# Patient Record
Sex: Female | Born: 1975 | Race: White | Hispanic: No | Smoking: Former smoker
Health system: Southern US, Community
[De-identification: ages and names within clinical notes are randomized; demographics above are authoritative.]

## PROBLEM LIST (undated history)

## (undated) ENCOUNTER — Inpatient Hospital Stay (HOSPITAL_COMMUNITY): Payer: Self-pay

## (undated) DIAGNOSIS — Z87442 Personal history of urinary calculi: Secondary | ICD-10-CM

## (undated) DIAGNOSIS — F419 Anxiety disorder, unspecified: Secondary | ICD-10-CM

## (undated) DIAGNOSIS — C259 Malignant neoplasm of pancreas, unspecified: Secondary | ICD-10-CM

## (undated) DIAGNOSIS — I10 Essential (primary) hypertension: Secondary | ICD-10-CM

## (undated) DIAGNOSIS — R112 Nausea with vomiting, unspecified: Secondary | ICD-10-CM

## (undated) DIAGNOSIS — F32A Depression, unspecified: Secondary | ICD-10-CM

## (undated) DIAGNOSIS — D649 Anemia, unspecified: Secondary | ICD-10-CM

## (undated) DIAGNOSIS — O24419 Gestational diabetes mellitus in pregnancy, unspecified control: Secondary | ICD-10-CM

## (undated) DIAGNOSIS — F329 Major depressive disorder, single episode, unspecified: Secondary | ICD-10-CM

## (undated) DIAGNOSIS — C801 Malignant (primary) neoplasm, unspecified: Secondary | ICD-10-CM

## (undated) DIAGNOSIS — Z8619 Personal history of other infectious and parasitic diseases: Secondary | ICD-10-CM

## (undated) DIAGNOSIS — Z9889 Other specified postprocedural states: Secondary | ICD-10-CM

## (undated) DIAGNOSIS — M1990 Unspecified osteoarthritis, unspecified site: Secondary | ICD-10-CM

## (undated) DIAGNOSIS — R569 Unspecified convulsions: Secondary | ICD-10-CM

## (undated) DIAGNOSIS — I2699 Other pulmonary embolism without acute cor pulmonale: Secondary | ICD-10-CM

## (undated) HISTORY — DX: Other pulmonary embolism without acute cor pulmonale: I26.99

## (undated) HISTORY — PX: WISDOM TOOTH EXTRACTION: SHX21

## (undated) HISTORY — DX: Malignant neoplasm of pancreas, unspecified: C25.9

## (undated) HISTORY — DX: Gestational diabetes mellitus in pregnancy, unspecified control: O24.419

## (undated) HISTORY — DX: Anemia, unspecified: D64.9

## (undated) HISTORY — PX: APPENDECTOMY: SHX54

## (undated) HISTORY — DX: Unspecified osteoarthritis, unspecified site: M19.90

## (undated) HISTORY — DX: Personal history of urinary calculi: Z87.442

## (undated) HISTORY — DX: Malignant (primary) neoplasm, unspecified: C80.1

## (undated) HISTORY — DX: Personal history of other infectious and parasitic diseases: Z86.19

## (undated) MED FILL — Fluorouracil IV Soln 5 GM/100ML (50 MG/ML): INTRAVENOUS | Qty: 77 | Status: AC

## (undated) MED FILL — Fosaprepitant Dimeglumine For IV Infusion 150 MG (Base Eq): INTRAVENOUS | Qty: 5 | Status: AC

## (undated) MED FILL — Dexamethasone Sodium Phosphate Inj 100 MG/10ML: INTRAMUSCULAR | Qty: 1 | Status: AC

## (undated) MED FILL — Irinotecan HCl Inj 100 MG/5ML (20 MG/ML): INTRAVENOUS | Qty: 12 | Status: AC

## (undated) MED FILL — Leucovorin Calcium For Inj 350 MG: INTRAMUSCULAR | Qty: 33.6 | Status: AC

## (undated) MED FILL — Oxaliplatin IV Soln 100 MG/20ML: INTRAVENOUS | Qty: 30 | Status: AC

## (undated) MED FILL — Fluorouracil IV Soln 5 GM/100ML (50 MG/ML): INTRAVENOUS | Qty: 81 | Status: AC

## (undated) MED FILL — Leucovorin Calcium For Inj 350 MG: INTRAMUSCULAR | Qty: 31.8 | Status: AC

## (undated) MED FILL — Oxaliplatin IV Soln 50 MG/10ML: INTRAVENOUS | Qty: 27 | Status: AC

## (undated) MED FILL — Irinotecan HCl Inj 100 MG/5ML (20 MG/ML): INTRAVENOUS | Qty: 13 | Status: AC

## (undated) MED FILL — Oxaliplatin IV Soln 100 MG/20ML: INTRAVENOUS | Qty: 27 | Status: AC

---

## 1999-01-04 ENCOUNTER — Emergency Department (HOSPITAL_COMMUNITY): Admission: EM | Admit: 1999-01-04 | Discharge: 1999-01-04 | Payer: Self-pay | Admitting: Emergency Medicine

## 1999-06-14 ENCOUNTER — Encounter: Payer: Self-pay | Admitting: Emergency Medicine

## 1999-06-14 ENCOUNTER — Emergency Department (HOSPITAL_COMMUNITY): Admission: EM | Admit: 1999-06-14 | Discharge: 1999-06-14 | Payer: Self-pay | Admitting: Emergency Medicine

## 1999-08-15 ENCOUNTER — Encounter: Payer: Self-pay | Admitting: Emergency Medicine

## 1999-08-15 ENCOUNTER — Emergency Department (HOSPITAL_COMMUNITY): Admission: EM | Admit: 1999-08-15 | Discharge: 1999-08-15 | Payer: Self-pay | Admitting: Emergency Medicine

## 1999-09-18 DIAGNOSIS — R569 Unspecified convulsions: Secondary | ICD-10-CM

## 1999-09-18 HISTORY — DX: Unspecified convulsions: R56.9

## 1999-11-05 ENCOUNTER — Inpatient Hospital Stay (HOSPITAL_COMMUNITY): Admission: EM | Admit: 1999-11-05 | Discharge: 1999-11-07 | Payer: Self-pay

## 1999-11-05 ENCOUNTER — Encounter: Payer: Self-pay | Admitting: General Surgery

## 1999-11-14 ENCOUNTER — Emergency Department (HOSPITAL_COMMUNITY): Admission: EM | Admit: 1999-11-14 | Discharge: 1999-11-14 | Payer: Self-pay | Admitting: Emergency Medicine

## 1999-11-17 ENCOUNTER — Encounter: Payer: Self-pay | Admitting: Emergency Medicine

## 1999-11-17 ENCOUNTER — Emergency Department (HOSPITAL_COMMUNITY): Admission: EM | Admit: 1999-11-17 | Discharge: 1999-11-17 | Payer: Self-pay | Admitting: Emergency Medicine

## 2000-02-24 ENCOUNTER — Encounter: Payer: Self-pay | Admitting: Emergency Medicine

## 2000-02-24 ENCOUNTER — Emergency Department (HOSPITAL_COMMUNITY): Admission: EM | Admit: 2000-02-24 | Discharge: 2000-02-24 | Payer: Self-pay | Admitting: Emergency Medicine

## 2000-02-29 ENCOUNTER — Emergency Department (HOSPITAL_COMMUNITY): Admission: EM | Admit: 2000-02-29 | Discharge: 2000-02-29 | Payer: Self-pay | Admitting: Emergency Medicine

## 2000-04-26 ENCOUNTER — Emergency Department (HOSPITAL_COMMUNITY): Admission: EM | Admit: 2000-04-26 | Discharge: 2000-04-26 | Payer: Self-pay | Admitting: Emergency Medicine

## 2000-07-18 ENCOUNTER — Inpatient Hospital Stay (HOSPITAL_COMMUNITY): Admission: AD | Admit: 2000-07-18 | Discharge: 2000-07-18 | Payer: Self-pay | Admitting: Obstetrics & Gynecology

## 2001-02-26 ENCOUNTER — Emergency Department (HOSPITAL_COMMUNITY): Admission: EM | Admit: 2001-02-26 | Discharge: 2001-02-26 | Payer: Self-pay | Admitting: Emergency Medicine

## 2002-05-26 ENCOUNTER — Emergency Department (HOSPITAL_COMMUNITY): Admission: EM | Admit: 2002-05-26 | Discharge: 2002-05-26 | Payer: Self-pay | Admitting: Emergency Medicine

## 2002-06-16 ENCOUNTER — Emergency Department (HOSPITAL_COMMUNITY): Admission: EM | Admit: 2002-06-16 | Discharge: 2002-06-16 | Payer: Self-pay | Admitting: *Deleted

## 2002-08-01 ENCOUNTER — Emergency Department (HOSPITAL_COMMUNITY): Admission: EM | Admit: 2002-08-01 | Discharge: 2002-08-01 | Payer: Self-pay | Admitting: Emergency Medicine

## 2002-08-01 ENCOUNTER — Encounter: Payer: Self-pay | Admitting: Emergency Medicine

## 2002-12-19 ENCOUNTER — Inpatient Hospital Stay (HOSPITAL_COMMUNITY): Admission: AD | Admit: 2002-12-19 | Discharge: 2002-12-19 | Payer: Self-pay | Admitting: Obstetrics and Gynecology

## 2003-07-02 ENCOUNTER — Emergency Department (HOSPITAL_COMMUNITY): Admission: EM | Admit: 2003-07-02 | Discharge: 2003-07-02 | Payer: Self-pay | Admitting: Emergency Medicine

## 2003-07-02 ENCOUNTER — Encounter: Payer: Self-pay | Admitting: Emergency Medicine

## 2005-12-29 ENCOUNTER — Emergency Department (HOSPITAL_COMMUNITY): Admission: EM | Admit: 2005-12-29 | Discharge: 2005-12-29 | Payer: Self-pay | Admitting: Family Medicine

## 2007-08-19 ENCOUNTER — Emergency Department (HOSPITAL_COMMUNITY): Admission: EM | Admit: 2007-08-19 | Discharge: 2007-08-19 | Payer: Self-pay | Admitting: Emergency Medicine

## 2008-01-30 ENCOUNTER — Inpatient Hospital Stay (HOSPITAL_COMMUNITY): Admission: AD | Admit: 2008-01-30 | Discharge: 2008-02-03 | Payer: Self-pay | Admitting: Obstetrics

## 2008-01-30 ENCOUNTER — Inpatient Hospital Stay (HOSPITAL_COMMUNITY): Admission: AD | Admit: 2008-01-30 | Discharge: 2008-01-30 | Payer: Self-pay | Admitting: Obstetrics

## 2009-10-20 ENCOUNTER — Emergency Department (HOSPITAL_COMMUNITY): Admission: EM | Admit: 2009-10-20 | Discharge: 2009-10-21 | Payer: Self-pay | Admitting: Emergency Medicine

## 2010-12-07 LAB — URINALYSIS, ROUTINE W REFLEX MICROSCOPIC
Bilirubin Urine: NEGATIVE
Glucose, UA: NEGATIVE mg/dL
Nitrite: NEGATIVE
Urobilinogen, UA: 0.2 mg/dL (ref 0.0–1.0)

## 2010-12-07 LAB — URINE MICROSCOPIC-ADD ON

## 2011-01-30 NOTE — Op Note (Signed)
[name], Christina [name]               ACCOUNT NO.:  [id number]   MEDICAL RECORD NO.:  [id number]          PATIENT TYPE:  INP   LOCATION:  9146                          FACILITY:  [facility]   PHYSICIAN:  Kathreen Cosier, M.D.DATE OF BIRTH:  1976/08/11   DATE OF PROCEDURE:  01/31/2008  DATE OF DISCHARGE:                               OPERATIVE REPORT   PREOPERATIVE DIAGNOSIS:  Active labor, the patient demands C-section.   POSTOPERATIVE DIAGNOSIS:  Active labor, the patient demands C-section.   SURGEON:  Kathreen Cosier, MD.   ANESTHESIA:  Epidural.   PROCEDURE:  The patient placed on the operating table in supine  position.  Abdomen prepped and draped, bladder emptied with a Foley  catheter.  Transverse suprapubic incision made and carried down to the  rectus fascia.  Fascia cleaned and incised to length of the incision.  The recti muscles were retracted laterally.  Peritoneum was incised  longitudinally.  Transverse incision made in the visceral peritoneum  above the bladder.  Bladder mobilized inferiorly.  Transverse lower  uterine incision was made.  The patient delivered from the OP position  of a female, Apgar 8 and 9, weighing 7 pounds 3 ounces.  Team in  attendance.  Fluid clear.  Placenta fundal removed manually and sent to  labor and delivery.  Uterine cavity cleaned with dry laps.  Uterine  incision closed in 1 layer with continuous suture of #1 chromic.  Hemostasis was satisfactory.  Bladder flap reattached with 2-0 chromic.  Uterus well contracted.  Tubes and ovaries normal.  Abdomen closed in  layers.  Peritoneum continuous suture of 0-chromic fascia, continuous  suture of 0-Dexon.  Skin closed with subcuticular stitch of 4-0  Monocryl.  Blood loss 400 mL.           ______________________________  Kathreen Cosier, M.D.     BAM/MEDQ  D:  01/31/2008  T:  02/01/2008  Job:  [id number]

## 2011-01-30 NOTE — H&P (Signed)
NAMEVICIE, CECH               ACCOUNT NO.:  [id number]   MEDICAL RECORD NO.:  [id number]           PATIENT TYPE:   LOCATION:                                 FACILITY:   PHYSICIAN:  Kathreen Cosier, M.D.DATE OF BIRTH:  11/12/1975   DATE OF ADMISSION:  01/31/2008  DATE OF DISCHARGE:                              HISTORY & PHYSICAL   The patient is a 35 year old primigravida, EDC Feb 03, 2008, membranes  ruptured at 8 p.m. last night, who was having occasional contractions.  She had a negative GBS and HIV.  Cervix was 1 cm, 100%, vertex -3.  She  was started on Pitocin at 5:30 a.m. on the day of admission.  At 9:35  a.m., she was having some variables in laid position.  IUPC was  inserted.  An amnioinfusion began.  She was 4 cm, 100%, and -2.  At 2:45  p.m., still having mild variables with each contraction and fetal  tachycardia up to 160-170, cervix 6 cm, vertex -2; and the patient said  that she was not going to live anymore and demanded C-section, her wish  was granted.   PHYSICAL EXAMINATION:  GENERAL:  Revealed a well-developed female in no  distress.  She has an epidural.  HEENT:  Negative.  LUNGS:  Clear.  HEART:  Regular rhythm.  No murmurs or gallops.  BREASTS:  No masses.  ABDOMEN:  Term-size uterus.  PELVIC:  As described above.  LEGS:  Negative.           ______________________________  Kathreen Cosier, M.D.     BAM/MEDQ  D:  01/31/2008  T:  02/01/2008  Job:  [id number]

## 2011-02-02 NOTE — Discharge Summary (Signed)
[name], [name]               ACCOUNT NO.:  [id number]   MEDICAL RECORD NO.:  [id number]          PATIENT TYPE:  INP   LOCATION:  9199                          FACILITY:  [facility]   PHYSICIAN:  Kathreen Cosier, M.D.DATE OF BIRTH:  Aug 11, 1976   DATE OF ADMISSION:  01/30/2008  DATE OF DISCHARGE:  02/03/2008                               DISCHARGE SUMMARY   The patient is a 35 year old gravida 1, EDC Feb 03, 2008.  She was  admitted with ruptured membranes at 8 p.m. the night prior to admission.  Negative GBS.  She was 1 cm, 100% vertex, -3.  She had some variable  decelerations.  An IUPC was inserted and amnioinfusion began.  The  patient progressed to 6 cm vertex, -2, and then the patient said she did  not want a labor anymore and demanded a C section.  This was performed  and she had a female, Apgar 8 and 9, weighing 7 pounds 3 ounces.  Postoperatively, she did well.  Hemoglobin was 11.5.  She was discharged  on the third postop day, ambulatory on a regular diet, Tylox for pain,  and to see me in 6 weeks.   DISCHARGE DIAGNOSIS:  Status post primary low transverse cesarean  section at term.           ______________________________  Kathreen Cosier, M.D.     BAM/MEDQ  D:  02/25/2008  T:  02/25/2008  Job:  [id number]

## 2011-06-13 LAB — RPR: RPR Ser Ql: NONREACTIVE

## 2011-06-13 LAB — CBC
HCT: 38
MCHC: 33.2
Platelets: 274
RBC: 4.56
RDW: 13.5
WBC: 9

## 2012-09-02 ENCOUNTER — Emergency Department (HOSPITAL_COMMUNITY): Payer: Self-pay

## 2012-09-02 ENCOUNTER — Emergency Department (HOSPITAL_COMMUNITY)
Admission: EM | Admit: 2012-09-02 | Discharge: 2012-09-02 | Disposition: A | Discharge status: Home / Self Care | Payer: Self-pay | Attending: Emergency Medicine | Admitting: Emergency Medicine

## 2012-09-02 ENCOUNTER — Encounter (HOSPITAL_COMMUNITY): Payer: Self-pay | Admitting: Emergency Medicine

## 2012-09-02 DIAGNOSIS — R0602 Shortness of breath: Secondary | ICD-10-CM | POA: Insufficient documentation

## 2012-09-02 DIAGNOSIS — F17200 Nicotine dependence, unspecified, uncomplicated: Secondary | ICD-10-CM | POA: Insufficient documentation

## 2012-09-02 DIAGNOSIS — Z8659 Personal history of other mental and behavioral disorders: Secondary | ICD-10-CM | POA: Insufficient documentation

## 2012-09-02 DIAGNOSIS — M940 Chondrocostal junction syndrome [Tietze]: Secondary | ICD-10-CM | POA: Insufficient documentation

## 2012-09-02 DIAGNOSIS — G40909 Epilepsy, unspecified, not intractable, without status epilepticus: Secondary | ICD-10-CM | POA: Insufficient documentation

## 2012-09-02 HISTORY — DX: Unspecified convulsions: R56.9

## 2012-09-02 HISTORY — DX: Major depressive disorder, single episode, unspecified: F32.9

## 2012-09-02 HISTORY — DX: Depression, unspecified: F32.A

## 2012-09-02 HISTORY — DX: Anxiety disorder, unspecified: F41.9

## 2012-09-02 LAB — BASIC METABOLIC PANEL
BUN: 9 mg/dL (ref 6–23)
GFR calc Af Amer: >90 mL/min (ref >90)
GFR calc non Af Amer: >90 mL/min (ref >90)
Potassium: 3.7 mEq/L (ref 3.5–5.1)

## 2012-09-02 LAB — D-DIMER, QUANTITATIVE: D-Dimer, Quant: 0.3 ug/mL-FEU (ref 0.00–0.48)

## 2012-09-02 LAB — CBC WITH DIFFERENTIAL/PLATELET
Eosinophils Absolute: 0.1 10*3/uL (ref 0.0–0.7)
Hemoglobin: 14.9 g/dL (ref 12.0–15.0)
Lymphs Abs: 2.4 10*3/uL (ref 0.7–4.0)
MCV: 84.5 fL (ref 78.0–100.0)
Monocytes Absolute: 0.4 10*3/uL (ref 0.1–1.0)
Monocytes Relative: 5 % (ref 3–12)
Neutro Abs: 5.1 10*3/uL (ref 1.7–7.7)
Neutrophils Relative %: 64 % (ref 43–77)
Platelets: 288 10*3/uL (ref 150–400)
RDW: 13 % (ref 11.5–15.5)

## 2012-09-02 LAB — TROPONIN I: Troponin I: <0.3 ng/mL (ref <0.30)

## 2012-09-02 NOTE — ED Notes (Signed)
Pt c/o mid sub sternal cp x 1 hour
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
## 2013-01-24 ENCOUNTER — Emergency Department (HOSPITAL_COMMUNITY)
Admission: EM | Admit: 2013-01-24 | Discharge: 2013-01-24 | Disposition: A | Discharge status: Home / Self Care | Payer: Medicaid Other | Attending: Emergency Medicine | Admitting: Emergency Medicine

## 2013-01-24 ENCOUNTER — Encounter (HOSPITAL_COMMUNITY): Payer: Self-pay | Admitting: Emergency Medicine

## 2013-01-24 DIAGNOSIS — M549 Dorsalgia, unspecified: Secondary | ICD-10-CM

## 2013-01-24 DIAGNOSIS — F17200 Nicotine dependence, unspecified, uncomplicated: Secondary | ICD-10-CM | POA: Insufficient documentation

## 2013-01-24 DIAGNOSIS — IMO0002 Reserved for concepts with insufficient information to code with codable children: Secondary | ICD-10-CM | POA: Insufficient documentation

## 2013-01-24 DIAGNOSIS — Z8659 Personal history of other mental and behavioral disorders: Secondary | ICD-10-CM | POA: Insufficient documentation

## 2013-01-24 DIAGNOSIS — Z8669 Personal history of other diseases of the nervous system and sense organs: Secondary | ICD-10-CM | POA: Insufficient documentation

## 2013-01-24 MED ORDER — DIAZEPAM 2 MG PO TABS
2.0000 mg | ORAL_TABLET | Freq: Once | ORAL | Status: AC
  Administered 2013-01-24: 2 mg via ORAL
  Filled 2013-01-24: qty 1

## 2013-01-24 MED ORDER — OXYCODONE-ACETAMINOPHEN 5-325 MG PO TABS: 1.0000 | ORAL_TABLET | ORAL | Status: DC | PRN
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
## 2013-01-24 NOTE — ED Notes (Signed)
Talked with [employer], the patient wants documentation to take to the [employer] for her case file. Officer states that she can take her pictures and this chart can reflect where the new wounds /bruising is and the patient will have to take the documentation to be included with the case file
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
## 2013-01-25 NOTE — ED Provider Notes (Signed)
Medical screening examination/treatment/procedure(s) were performed by non-physician practitioner and as supervising physician I was immediately available for consultation/collaboration.  Syesha Thaw, MD 01/25/13 0819 

## 2013-05-06 ENCOUNTER — Emergency Department (HOSPITAL_COMMUNITY)
Admission: EM | Admit: 2013-05-06 | Discharge: 2013-05-06 | Disposition: A | Discharge status: Home / Self Care | Payer: Medicaid Other | Attending: Emergency Medicine | Admitting: Emergency Medicine

## 2013-05-06 ENCOUNTER — Encounter (HOSPITAL_COMMUNITY): Payer: Self-pay | Admitting: Emergency Medicine

## 2013-05-06 DIAGNOSIS — J029 Acute pharyngitis, unspecified: Secondary | ICD-10-CM

## 2013-05-06 DIAGNOSIS — F411 Generalized anxiety disorder: Secondary | ICD-10-CM | POA: Insufficient documentation

## 2013-05-06 DIAGNOSIS — F3289 Other specified depressive episodes: Secondary | ICD-10-CM | POA: Insufficient documentation

## 2013-05-06 DIAGNOSIS — G40909 Epilepsy, unspecified, not intractable, without status epilepticus: Secondary | ICD-10-CM | POA: Insufficient documentation

## 2013-05-06 DIAGNOSIS — F17200 Nicotine dependence, unspecified, uncomplicated: Secondary | ICD-10-CM | POA: Insufficient documentation

## 2013-05-06 DIAGNOSIS — F329 Major depressive disorder, single episode, unspecified: Secondary | ICD-10-CM | POA: Insufficient documentation

## 2013-05-06 DIAGNOSIS — Z79899 Other long term (current) drug therapy: Secondary | ICD-10-CM | POA: Insufficient documentation

## 2013-05-06 MED ORDER — AMOXICILLIN 500 MG PO CAPS: 500.0000 mg | ORAL_CAPSULE | Freq: Three times a day (TID) | ORAL | Status: DC

## 2013-05-06 NOTE — ED Provider Notes (Signed)
CSN: [id number]     Arrival date & time 05/06/13  2259 History  This chart was scribed for Christina [name],  by Danella Maiers, ED Scribe. This patient was seen in room APA18/APA18 and the patient's care was started at 11:27 PM.   Chief Complaint  Patient presents with  . Sore Throat    The history is provided by the patient. No language interpreter was used.   HPI Comments: Christina [name] is a 37 y.o. female who presents to the Emergency Department complaining of constant, gradually worsening sore throat that started a few days ago. She reports that her child's babysitter has mono and a relative has strep throat. Her child who she lives with is experiencing similar symptoms. 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 She denies cough, rash, emesis, diarrhea, and nausea. She is a current every day smoker and an occasional alcohol user.   Past Medical History  Diagnosis Date  . Seizures   . Anxiety   . Depression    Past Surgical History  Procedure Laterality Date  . Appendectomy    . Cesarean section     History reviewed. No pertinent family history. History  Substance Use Topics  . Smoking status: Current Every Day Smoker  . Smokeless tobacco: Not on file  . Alcohol Use: Yes   OB History   Grav Para Term Preterm Abortions TAB SAB Ect Mult Living                 Review of Systems  Constitutional: Negative for fever and chills.  HENT: Positive for sore throat. Negative for trouble swallowing.   Respiratory: Negative for cough.   Gastrointestinal: Negative for nausea, vomiting and diarrhea.  Skin: Negative for rash.  All other systems reviewed and are negative.    Allergies  Claritin and Diphenhydramine  Home Medications   Current Outpatient Rx  Name  Route  Sig  Dispense  Refill  . ibuprofen (ADVIL,MOTRIN) 200 MG tablet   Oral   Take 200 mg by mouth every 6 (six) hours as needed for pain.         Marland Kitchen  lamoTRIgine (LAMICTAL) 25 MG tablet   Oral   Take 25 mg by mouth daily.         Marland Kitchen oxyCODONE-acetaminophen (PERCOCET) 5-325 MG per tablet   Oral   Take 1 tablet by mouth every 4 (four) hours as needed for pain.   15 tablet   0    Triage vitals: BP 130/108  Pulse 82  Temp(Src) 98 F (36.7 C) (Oral)  Resp 20  Ht 4\' 11"  (1.499 m)  Wt 170 lb (77.111 kg)  BMI 34.32 kg/m2  SpO2 100%  LMP 04/12/2013 Physical Exam  Constitutional: She is oriented to person, place, and time. She appears well-developed and well-nourished. No distress.  HENT:  Head: Normocephalic and atraumatic.  Right Ear: Hearing normal.  Left Ear: Hearing normal.  Nose: Nose normal.  Mouth/Throat: Oropharynx is clear and moist and mucous membranes are normal.  Eyes: Conjunctivae and EOM are normal. Pupils are equal, round, and reactive to light.  Neck: Normal range of motion. Neck supple.  Cardiovascular: Regular rhythm, S1 normal and S2 normal.  Exam reveals no gallop and no friction rub.   No murmur heard. Pulmonary/Chest: Effort normal and breath sounds normal. No respiratory distress. She exhibits no tenderness.  Abdominal: Soft. Normal appearance and bowel sounds are normal. There is  no hepatosplenomegaly. There is no tenderness. There is no rebound, no guarding, no tenderness at McBurney's point and negative Murphy's sign. No hernia.  Musculoskeletal: Normal range of motion.  Neurological: She is alert and oriented to person, place, and time. She has normal strength. No cranial nerve deficit or sensory deficit. Coordination normal. GCS eye subscore is 4. GCS verbal subscore is 5. GCS motor subscore is 6.  Skin: Skin is warm, dry and intact. No rash noted. No cyanosis.  Psychiatric: She has a normal mood and affect. Her speech is normal and behavior is normal. Thought content normal.    ED Course  Medications - No data to display  DIAGNOSTIC STUDIES: Oxygen Saturation is 100% on room air, normal by my  interpretation.    COORDINATION OF CARE: 11:32 PM- Discussed treatment plan with pt, which includes discharge with amoxicillin, and pt agrees to plan.    Procedures (including critical care time)  Labs Reviewed - No data to display No results found.  Diagnosis: Sore throat, strep exposure  MDM  She presents to ER with complaints of sore throat. Her son has similar symptoms. Patient reports that her son's caregiver who was diagnosed with mono in the last few weeks, but in the last couple of days the grandmother was diagnosed with strep throat and she watches the sun 2 times a week. Patient will therefore be empirically covered for strep throat.   I personally performed the services described in this documentation, which was scribed in my presence. The recorded information has been reviewed and is accurate.     Christina Crease, MD 05/06/13 [phone]

## 2013-05-06 NOTE — ED Notes (Signed)
Pt has been around person with mono and strep throat.

## 2013-09-17 DIAGNOSIS — O24419 Gestational diabetes mellitus in pregnancy, unspecified control: Secondary | ICD-10-CM

## 2013-09-17 HISTORY — DX: Gestational diabetes mellitus in pregnancy, unspecified control: O24.419

## 2013-11-15 ENCOUNTER — Emergency Department (HOSPITAL_COMMUNITY)
Admission: EM | Admit: 2013-11-15 | Discharge: 2013-11-15 | Disposition: A | Discharge status: Home / Self Care | Payer: Medicaid Other | Attending: Emergency Medicine | Admitting: Emergency Medicine

## 2013-11-15 ENCOUNTER — Encounter (HOSPITAL_COMMUNITY): Payer: Self-pay | Admitting: Emergency Medicine

## 2013-11-15 DIAGNOSIS — Y9389 Activity, other specified: Secondary | ICD-10-CM | POA: Insufficient documentation

## 2013-11-15 DIAGNOSIS — G40909 Epilepsy, unspecified, not intractable, without status epilepticus: Secondary | ICD-10-CM | POA: Insufficient documentation

## 2013-11-15 DIAGNOSIS — Y929 Unspecified place or not applicable: Secondary | ICD-10-CM | POA: Insufficient documentation

## 2013-11-15 DIAGNOSIS — Z8659 Personal history of other mental and behavioral disorders: Secondary | ICD-10-CM | POA: Insufficient documentation

## 2013-11-15 DIAGNOSIS — S61409A Unspecified open wound of unspecified hand, initial encounter: Secondary | ICD-10-CM | POA: Insufficient documentation

## 2013-11-15 DIAGNOSIS — Z79899 Other long term (current) drug therapy: Secondary | ICD-10-CM | POA: Insufficient documentation

## 2013-11-15 DIAGNOSIS — F17200 Nicotine dependence, unspecified, uncomplicated: Secondary | ICD-10-CM | POA: Insufficient documentation

## 2013-11-15 DIAGNOSIS — S61419A Laceration without foreign body of unspecified hand, initial encounter: Secondary | ICD-10-CM

## 2013-11-15 DIAGNOSIS — W268XXA Contact with other sharp object(s), not elsewhere classified, initial encounter: Secondary | ICD-10-CM | POA: Insufficient documentation

## 2013-11-15 NOTE — ED Notes (Signed)
Pt presents with a right index finger laceration, 4cm in length, active mild bleeding assessed. Has no apparent signs of distress.

## 2013-11-15 NOTE — Discharge Instructions (Signed)
You were treated for urinary laceration of the hand. This was cleaned and closed with sutures. Your sutures will dissolve in one to 2 weeks. Followup with your primary care provider for continued evaluation and treatment. Keep her wound clean and dry.    Laceration Care, Adult A laceration is a cut or lesion that goes through all layers of the skin and into the tissue just beneath the skin. TREATMENT  Some lacerations may not require closure. Some lacerations may not be able to be closed due to an increased risk of infection. It is important to see your caregiver as soon as possible after an injury to minimize the risk of infection and maximize the opportunity for successful closure. If closure is appropriate, pain medicines may be given, if needed. The wound will be cleaned to help prevent infection. Your caregiver will use stitches (sutures), staples, wound glue (adhesive), or skin adhesive strips to repair the laceration. These tools bring the skin edges together to allow for faster healing and a better cosmetic outcome. However, all wounds will heal with a scar. Once the wound has healed, scarring can be minimized by covering the wound with sunscreen during the day for 1 full year. HOME CARE INSTRUCTIONS  For sutures or staples:  Keep the wound clean and dry.  If you were given a bandage (dressing), you should change it at least once a day. Also, change the dressing if it becomes wet or dirty, or as directed by your caregiver.  Wash the wound with soap and water 2 times a day. Rinse the wound off with water to remove all soap. Pat the wound dry with a clean towel.  After cleaning, apply a thin layer of the antibiotic ointment as recommended by your caregiver. This will help prevent infection and keep the dressing from sticking.  You may shower as usual after the first 24 hours. Do not soak the wound in water until the sutures are removed.  Only take over-the-counter or prescription  medicines for pain, discomfort, or fever as directed by your caregiver.  Get your sutures or staples removed as directed by your caregiver. For skin adhesive strips:  Keep the wound clean and dry.  Do not get the skin adhesive strips wet. You may bathe carefully, using caution to keep the wound dry.  If the wound gets wet, pat it dry with a clean towel.  Skin adhesive strips will fall off on their own. You may trim the strips as the wound heals. Do not remove skin adhesive strips that are still stuck to the wound. They will fall off in time. For wound adhesive:  You may briefly wet your wound in the shower or bath. Do not soak or scrub the wound. Do not swim. Avoid periods of heavy perspiration until the skin adhesive has fallen off on its own. After showering or bathing, gently pat the wound dry with a clean towel.  Do not apply liquid medicine, cream medicine, or ointment medicine to your wound while the skin adhesive is in place. This may loosen the film before your wound is healed.  If a dressing is placed over the wound, be careful not to apply tape directly over the skin adhesive. This may cause the adhesive to be pulled off before the wound is healed.  Avoid prolonged exposure to sunlight or tanning lamps while the skin adhesive is in place. Exposure to ultraviolet light in the first year will darken the scar.  The skin adhesive will usually remain  in place for 5 to 10 days, then naturally fall off the skin. Do not pick at the adhesive film. You may need a tetanus shot if:  You cannot remember when you had your last tetanus shot.  You have never had a tetanus shot. If you get a tetanus shot, your arm may swell, get red, and feel warm to the touch. This is common and not a problem. If you need a tetanus shot and you choose not to have one, there is a rare chance of getting tetanus. Sickness from tetanus can be serious. SEEK MEDICAL CARE IF:   You have redness, swelling, or  increasing pain in the wound.  You see a red line that goes away from the wound.  You have yellowish-white fluid (pus) coming from the wound.  You have a fever.  You notice a bad smell coming from the wound or dressing.  Your wound breaks open before or after sutures have been removed.  You notice something coming out of the wound such as wood or glass.  Your wound is on your hand or foot and you cannot move a finger or toe. SEEK IMMEDIATE MEDICAL CARE IF:   Your pain is not controlled with prescribed medicine.  You have severe swelling around the wound causing pain and numbness or a change in color in your arm, hand, leg, or foot.  Your wound splits open and starts bleeding.  You have worsening numbness, weakness, or loss of function of any joint around or beyond the wound.  You develop painful lumps near the wound or on the skin anywhere on your body. MAKE SURE YOU:   Understand these instructions.  Will watch your condition.  Will get help right away if you are not doing well or get worse. Document Released: 09/03/2005 Document Revised: 11/26/2011 Document Reviewed: 02/27/2011 [facility] Patient Information 2014 [place], [place].

## 2013-11-15 NOTE — ED Provider Notes (Signed)
CSN: [id number]     Arrival date & time 11/15/13  1954 History   First MD Initiated Contact with Patient 11/15/13 2226    This chart was scribed for Christina Sams PA-C, a non-physician practitioner working with Tanna Furry, MD by Denice Bors, ED Scribe. This patient was seen in room WTR7/WTR7 and the patient's care was started at 11:07 PM     Chief Complaint  Patient presents with  . Laceration   The history is provided by the patient. No language interpreter was used.   HPI Comments: Christina [name] is a 38 y.o. female who presents to the Emergency Department complaining of 3 cm laceration on dorsal right hand overlying 2nd MCP joint with a broken glass while washing dishes onset 4 hours. Reports associated decreased ROM of right index finger secondary to pain. Reports pain is exacerbated by touch and movement. Denies any alleviating factors. Denies associated numbness. Reports unsure about tetanus status but thinks she got it in 2009. Reports she will check with her doctor tomorrow about tetanus status.     Past Medical History  Diagnosis Date  . Seizures   . Anxiety   . Depression    Past Surgical History  Procedure Laterality Date  . Appendectomy    . Cesarean section     History reviewed. No pertinent family history. History  Substance Use Topics  . Smoking status: Current Every Day Smoker -- 2.00 packs/day    Types: Cigarettes  . Smokeless tobacco: Not on file  . Alcohol Use: Yes   OB History   Grav Para Term Preterm Abortions TAB SAB Ect Mult Living   1              Review of Systems  Neurological: Negative for weakness and numbness.     A complete 10 system review of systems was obtained and all systems are negative except as noted in the HPI and PMHx.    Allergies  Claritin and Diphenhydramine  Home Medications   Current Outpatient Rx  Name  Route  Sig  Dispense  Refill  . acetaminophen (TYLENOL) 325 MG tablet   Oral   Take 325 mg by mouth every 6  (six) hours as needed for mild pain, fever or headache.         . lamoTRIgine (LAMICTAL) 25 MG tablet   Oral   Take 25 mg by mouth daily as needed (seizures).          . Multiple Vitamin (MULTIVITAMIN WITH MINERALS) TABS tablet   Oral   Take 1 tablet by mouth daily.          BP 126/70  Pulse 66  Temp(Src) 98.2 F (36.8 C) (Oral)  Resp 20  Ht 4\' 11"  (1.499 m)  Wt 160 lb (72.576 kg)  BMI 32.30 kg/m2  SpO2 100%  LMP 04/12/2013 Physical Exam  Nursing note and vitals reviewed. Constitutional: She is oriented to person, place, and time. She appears well-developed and well-nourished. No distress.  HENT:  Head: Normocephalic and atraumatic.  Eyes: EOM are normal.  Neck: Neck supple. No tracheal deviation present.  Cardiovascular: Normal rate.   Pulmonary/Chest: Effort normal. No respiratory distress.  Musculoskeletal: Normal range of motion.  Normal distal sensations and capillary refill  Neurological: She is alert and oriented to person, place, and time.  Skin: Skin is warm and dry.  3 cm laceration overlying right 2nd MCP joint   Psychiatric: She has a normal mood and affect. Her behavior is  normal.    ED Course  Procedures   COORDINATION OF CARE:  Nursing notes reviewed. Vital signs reviewed. Initial pt interview and examination performed.   11:10 PM-Discussed treatment plan with pt at bedside, which includes laceration repair. Pt agrees with plan.  LACERATION REPAIR Performed by: Christina Sams PA-C Consent: Verbal consent obtained. Risks and benefits: risks, benefits and alternatives were discussed Patient identity confirmed: provided demographic data Time out performed prior to procedure Prepped and Draped in normal sterile fashion Wound explored Laceration Location: dorsal aspect overlying 2nd MCP joint of right hand   Laceration Length: 3 cm No Foreign Bodies seen or palpated Anesthesia: local infiltration Local anesthetic: lidocaine 2% without  epinephrine Anesthetic total: 1 ml Irrigation method: syringe Amount of cleaning: standard Skin closure: 5-0 vicryl rapide  Number of sutures or staples: 4 sutures Technique: simple interrupted  Patient tolerance: Patient tolerated the procedure well with no immediate complications.   MDM   Final diagnoses:  Laceration of hand      I personally performed the services described in this documentation, which was scribed in my presence. The recorded information has been reviewed and is accurate.   Martie Lee, PA-C 11/16/13 [phone]

## 2013-11-24 NOTE — ED Provider Notes (Signed)
Medical screening examination/treatment/procedure(s) were performed by non-physician practitioner and as supervising physician I was immediately available for consultation/collaboration.   EKG Interpretation None        Tanna Furry, MD 11/24/13 [phone]

## 2013-11-25 ENCOUNTER — Encounter (HOSPITAL_COMMUNITY): Payer: Self-pay | Admitting: Emergency Medicine

## 2013-11-25 ENCOUNTER — Emergency Department (HOSPITAL_COMMUNITY)
Admission: EM | Admit: 2013-11-25 | Discharge: 2013-11-26 | Disposition: A | Discharge status: Home / Self Care | Payer: Medicaid Other | Attending: Emergency Medicine | Admitting: Emergency Medicine

## 2013-11-25 DIAGNOSIS — Z8659 Personal history of other mental and behavioral disorders: Secondary | ICD-10-CM | POA: Insufficient documentation

## 2013-11-25 DIAGNOSIS — O99330 Smoking (tobacco) complicating pregnancy, unspecified trimester: Secondary | ICD-10-CM | POA: Insufficient documentation

## 2013-11-25 DIAGNOSIS — S0010XA Contusion of unspecified eyelid and periocular area, initial encounter: Secondary | ICD-10-CM | POA: Insufficient documentation

## 2013-11-25 DIAGNOSIS — S0990XA Unspecified injury of head, initial encounter: Secondary | ICD-10-CM | POA: Insufficient documentation

## 2013-11-25 DIAGNOSIS — N898 Other specified noninflammatory disorders of vagina: Secondary | ICD-10-CM | POA: Insufficient documentation

## 2013-11-25 DIAGNOSIS — T148XXA Other injury of unspecified body region, initial encounter: Secondary | ICD-10-CM

## 2013-11-25 DIAGNOSIS — S3981XA Other specified injuries of abdomen, initial encounter: Secondary | ICD-10-CM | POA: Insufficient documentation

## 2013-11-25 DIAGNOSIS — S0083XA Contusion of other part of head, initial encounter: Secondary | ICD-10-CM

## 2013-11-25 DIAGNOSIS — Z8669 Personal history of other diseases of the nervous system and sense organs: Secondary | ICD-10-CM | POA: Insufficient documentation

## 2013-11-25 DIAGNOSIS — O9989 Other specified diseases and conditions complicating pregnancy, childbirth and the puerperium: Secondary | ICD-10-CM | POA: Insufficient documentation

## 2013-11-25 DIAGNOSIS — S0003XA Contusion of scalp, initial encounter: Secondary | ICD-10-CM | POA: Insufficient documentation

## 2013-11-25 DIAGNOSIS — T71163A Asphyxiation due to hanging, assault, initial encounter: Secondary | ICD-10-CM | POA: Insufficient documentation

## 2013-11-25 DIAGNOSIS — S1093XA Contusion of unspecified part of neck, initial encounter: Secondary | ICD-10-CM

## 2013-11-25 MED ORDER — HYDROCODONE-ACETAMINOPHEN 5-325 MG PO TABS: 1.0000 | ORAL_TABLET | Freq: Once | ORAL | Status: DC

## 2013-11-25 MED ORDER — ACETAMINOPHEN 325 MG PO TABS
650.0000 mg | ORAL_TABLET | Freq: Once | ORAL | Status: AC
  Administered 2013-11-26: 650 mg via ORAL
  Filled 2013-11-25: qty 2

## 2013-11-25 NOTE — ED Notes (Signed)
Nurse First Rounds : Nurse explained delay , wait time and process to pt. No distress/ respirations unlabored / calm / no distress.
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
## 2013-11-26 ENCOUNTER — Emergency Department (HOSPITAL_COMMUNITY): Payer: Medicaid Other

## 2013-11-26 LAB — WET PREP, GENITAL
Trich, Wet Prep: NONE SEEN
YEAST WET PREP: NONE SEEN

## 2013-11-26 LAB — CBC WITH DIFFERENTIAL/PLATELET
Basophils Absolute: 0.1 10*3/uL (ref 0.0–0.1)
Basophils Relative: 1 % (ref 0–1)
Eosinophils Absolute: 0.2 10*3/uL (ref 0.0–0.7)
Eosinophils Relative: 2 % (ref 0–5)
HCT: 37.3 % (ref 36.0–46.0)
HEMOGLOBIN: 13.1 g/dL (ref 12.0–15.0)
LYMPHS ABS: 3.1 10*3/uL (ref 0.7–4.0)
Lymphocytes Relative: 31 % (ref 12–46)
MCH: 29.7 pg (ref 26.0–34.0)
MCHC: 35.1 g/dL (ref 30.0–36.0)
MCV: 84.6 fL (ref 78.0–100.0)
Monocytes Absolute: 0.8 10*3/uL (ref 0.1–1.0)
Monocytes Relative: 8 % (ref 3–12)
NEUTROS ABS: 5.6 10*3/uL (ref 1.7–7.7)
NEUTROS PCT: 58 % (ref 43–77)
PLATELETS: 274 10*3/uL (ref 150–400)
RBC: 4.41 MIL/uL (ref 3.87–5.11)
RDW: 12.7 % (ref 11.5–15.5)
WBC: 9.8 10*3/uL (ref 4.0–10.5)

## 2013-11-26 LAB — ABO/RH: ABO/RH(D): A POS

## 2013-11-26 LAB — URINE MICROSCOPIC-ADD ON

## 2013-11-26 LAB — I-STAT CHEM 8, ED
BUN: 7 mg/dL (ref 6–23)
Calcium, Ion: 1.14 mmol/L (ref 1.12–1.23)
Chloride: 102 mEq/L (ref 96–112)
Creatinine, Ser: 0.6 mg/dL (ref 0.50–1.10)
Glucose, Bld: 102 mg/dL — ABNORMAL HIGH (ref 70–99)
HCT: 39 % (ref 36.0–46.0)
Hemoglobin: 13.3 g/dL (ref 12.0–15.0)
POTASSIUM: 3.1 meq/L — AB (ref 3.7–5.3)
SODIUM: 138 meq/L (ref 137–147)
TCO2: 22 mmol/L (ref 0–100)

## 2013-11-26 LAB — URINALYSIS, ROUTINE W REFLEX MICROSCOPIC
BILIRUBIN URINE: NEGATIVE
Glucose, UA: NEGATIVE mg/dL
Hgb urine dipstick: NEGATIVE
KETONES UR: 40 mg/dL — AB
NITRITE: NEGATIVE
PH: 5.5 (ref 5.0–8.0)
PROTEIN: NEGATIVE mg/dL
Specific Gravity, Urine: 1.02 (ref 1.005–1.030)
Urobilinogen, UA: 1 mg/dL (ref 0.0–1.0)

## 2013-11-26 LAB — HCG, QUANTITATIVE, PREGNANCY: hCG, Beta Chain, Quant, S: 23506 m[IU]/mL — ABNORMAL HIGH (ref <5)

## 2013-11-26 NOTE — ED Provider Notes (Signed)
Medical screening examination/treatment/procedure(s) were performed by non-physician practitioner and as supervising physician I was immediately available for consultation/collaboration.   EKG Interpretation None        Hoy Morn, MD 11/26/13 [phone]

## 2013-11-26 NOTE — ED Notes (Signed)
Patient transported to US 

## 2013-11-26 NOTE — ED Notes (Signed)
Patient transported to X-ray 

## 2013-11-26 NOTE — ED Notes (Signed)
MD at bedside preforming US
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
## 2013-11-27 LAB — GC/CHLAMYDIA PROBE AMP
CT Probe RNA: NEGATIVE
GC PROBE AMP APTIMA: NEGATIVE

## 2014-01-27 ENCOUNTER — Encounter (HOSPITAL_COMMUNITY): Payer: Self-pay

## 2014-01-27 ENCOUNTER — Inpatient Hospital Stay (HOSPITAL_COMMUNITY)
Admission: AD | Admit: 2014-01-27 | Discharge: 2014-01-27 | Disposition: A | Discharge status: Home / Self Care | Payer: Medicaid Other | Source: Ambulatory Visit | Attending: Obstetrics & Gynecology | Admitting: Obstetrics & Gynecology

## 2014-01-27 DIAGNOSIS — F17200 Nicotine dependence, unspecified, uncomplicated: Secondary | ICD-10-CM | POA: Insufficient documentation

## 2014-01-27 DIAGNOSIS — R109 Unspecified abdominal pain: Secondary | ICD-10-CM | POA: Insufficient documentation

## 2014-01-27 DIAGNOSIS — N949 Unspecified condition associated with female genital organs and menstrual cycle: Secondary | ICD-10-CM | POA: Insufficient documentation

## 2014-01-27 LAB — URINALYSIS, ROUTINE W REFLEX MICROSCOPIC
Bilirubin Urine: NEGATIVE
Glucose, UA: NEGATIVE mg/dL
HGB URINE DIPSTICK: NEGATIVE
Ketones, ur: 15 mg/dL — AB
LEUKOCYTES UA: NEGATIVE
Nitrite: NEGATIVE
PROTEIN: NEGATIVE mg/dL
UROBILINOGEN UA: 0.2 mg/dL (ref 0.0–1.0)
pH: 6 (ref 5.0–8.0)

## 2014-01-27 LAB — WET PREP, GENITAL
Clue Cells Wet Prep HPF POC: NONE SEEN
TRICH WET PREP: NONE SEEN
YEAST WET PREP: NONE SEEN

## 2014-01-27 MED ORDER — PROMETHAZINE HCL 25 MG PO TABS
25.0000 mg | ORAL_TABLET | Freq: Once | ORAL | Status: AC
  Administered 2014-01-27: 25 mg via ORAL
  Filled 2014-01-27: qty 1

## 2014-01-27 MED ORDER — ACETAMINOPHEN-CODEINE #3 300-30 MG PO TABS
1.0000 | ORAL_TABLET | Freq: Once | ORAL | Status: AC
  Administered 2014-01-27: 1 via ORAL
  Filled 2014-01-27: qty 1

## 2014-01-27 NOTE — Discharge Instructions (Signed)
Abdominal Pain, Women °Abdominal (stomach, pelvic, or belly) pain can be caused by many things. It is important to tell your doctor: °· The location of the pain. °· Does it come and go or is it present all the time? °· Are there things that start the pain (eating certain foods, exercise)? °· Are there other symptoms associated with the pain (fever, nausea, vomiting, diarrhea)? °All of this is helpful to know when trying to find the cause of the pain. °CAUSES  °· Stomach: virus or bacteria infection, or ulcer. °· Intestine: appendicitis (inflamed appendix), regional ileitis (Crohn's disease), ulcerative colitis (inflamed colon), irritable bowel syndrome, diverticulitis (inflamed diverticulum of the colon), or cancer of the stomach or intestine. °· Gallbladder disease or stones in the gallbladder. °· Kidney disease, kidney stones, or infection. °· Pancreas infection or cancer. °· Fibromyalgia (pain disorder). °· Diseases of the female organs: °· Uterus: fibroid (non-cancerous) tumors or infection. °· Fallopian tubes: infection or tubal pregnancy. °· Ovary: cysts or tumors. °· Pelvic adhesions (scar tissue). °· Endometriosis (uterus lining tissue growing in the pelvis and on the pelvic organs). °· Pelvic congestion syndrome (female organs filling up with blood just before the menstrual period). °· Pain with the menstrual period. °· Pain with ovulation (producing an egg). °· Pain with an IUD (intrauterine device, birth control) in the uterus. °· Cancer of the female organs. °· Functional pain (pain not caused by a disease, may improve without treatment). °· Psychological pain. °· Depression. °DIAGNOSIS  °Your doctor will decide the seriousness of your pain by doing an examination. °· Blood tests. °· X-rays. °· Ultrasound. °· CT scan (computed tomography, special type of X-ray). °· MRI (magnetic resonance imaging). °· Cultures, for infection. °· Barium enema (dye inserted in the large intestine, to better view it with  X-rays). °· Colonoscopy (looking in intestine with a lighted tube). °· Laparoscopy (minor surgery, looking in abdomen with a lighted tube). °· Major abdominal exploratory surgery (looking in abdomen with a large incision). °TREATMENT  °The treatment will depend on the cause of the pain.  °· Many cases can be observed and treated at home. °· Over-the-counter medicines recommended by your caregiver. °· Prescription medicine. °· Antibiotics, for infection. °· Birth control pills, for painful periods or for ovulation pain. °· Hormone treatment, for endometriosis. °· Nerve blocking injections. °· Physical therapy. °· Antidepressants. °· Counseling with a psychologist or psychiatrist. °· Minor or major surgery. °HOME CARE INSTRUCTIONS  °· Do not take laxatives, unless directed by your caregiver. °· Take over-the-counter pain medicine only if ordered by your caregiver. Do not take aspirin because it can cause an upset stomach or bleeding. °· Try a clear liquid diet (broth or water) as ordered by your caregiver. Slowly move to a bland diet, as tolerated, if the pain is related to the stomach or intestine. °· Have a thermometer and take your temperature several times a day, and record it. °· Bed rest and sleep, if it helps the pain. °· Avoid sexual intercourse, if it causes pain. °· Avoid stressful situations. °· Keep your follow-up appointments and tests, as your caregiver orders. °· If the pain does not go away with medicine or surgery, you may try: °· Acupuncture. °· Relaxation exercises (yoga, meditation). °· Group therapy. °· Counseling. °SEEK MEDICAL CARE IF:  °· You notice certain foods cause stomach pain. °· Your home care treatment is not helping your pain. °· You need stronger pain medicine. °· You want your IUD removed. °· You feel faint or   lightheaded.  You develop nausea and vomiting.  You develop a rash.  You are having side effects or an allergy to your medicine. SEEK IMMEDIATE MEDICAL CARE IF:   Your  pain does not go away or gets worse.  You have a fever.  Your pain is felt only in portions of the abdomen. The right side could possibly be appendicitis. The left lower portion of the abdomen could be colitis or diverticulitis.  You are passing blood in your stools (bright red or black tarry stools, with or without vomiting).  You have blood in your urine.  You develop chills, with or without a fever.  You pass out. MAKE SURE YOU:   Understand these instructions.  Will watch your condition.  Will get help right away if you are not doing well or get worse. Document Released: 07/01/2007 Document Revised: 11/26/2011 Document Reviewed: 07/21/2009 [facility] Patient Information 2014 [place], [place]. Abdominal Pain During Pregnancy Belly (abdominal) pain is common during pregnancy. Most of the time, it is not a serious problem. Other times, it can be a sign that something is wrong with the pregnancy. Always tell your doctor if you have belly pain. HOME CARE Monitor your belly pain for any changes. The following actions may help you feel better:  Do not have sex (intercourse) or put anything in your vagina until you feel better.  Rest until your pain stops.  Drink clear fluids if you feel sick to your stomach (nauseous). Do not eat solid food until you feel better.  Only take medicine as told by your doctor.  Keep all doctor visits as told. GET HELP RIGHT AWAY IF:   You are bleeding, leaking fluid, or pieces of tissue come out of your vagina.  You have more pain or cramping.  You keep throwing up (vomiting).  You have pain when you pee (urinate) or have blood in your pee.  You have a fever.  You do not feel your baby moving as much.  You feel very weak or feel like passing out.  You have trouble breathing, with or without belly pain.  You have a very bad headache and belly pain.  You have fluid leaking from your vagina and belly pain.  You keep having watery poop  (diarrhea).  Your belly pain does not go away after resting, or the pain gets worse. MAKE SURE YOU:   Understand these instructions.  Will watch your condition.  Will get help right away if you are not doing well or get worse. Document Released: 08/22/2009 Document Revised: 05/06/2013 Document Reviewed: 04/02/2013 [employer] [facility] Patient Information 2014 [place], [place].
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
## 2014-01-27 NOTE — MAU Note (Signed)
Pt presents via EMS with complaints of sharp shooting lower abdominal pain that dulls and goes away on both the left and right sides. Worse with movement. Denies any vaginal bleeding or discharge.

## 2014-01-28 LAB — GC/CHLAMYDIA PROBE AMP
CT Probe RNA: NEGATIVE
GC Probe RNA: NEGATIVE

## 2014-03-03 LAB — OB RESULTS CONSOLE ABO/RH: RH TYPE: POSITIVE

## 2014-03-03 LAB — OB RESULTS CONSOLE RUBELLA ANTIBODY, IGM: Rubella: IMMUNE

## 2014-03-03 LAB — OB RESULTS CONSOLE ANTIBODY SCREEN: Antibody Screen: NEGATIVE

## 2014-03-03 LAB — OB RESULTS CONSOLE HEPATITIS B SURFACE ANTIGEN: HEP B S AG: NEGATIVE

## 2014-03-03 LAB — OB RESULTS CONSOLE GC/CHLAMYDIA
Chlamydia: NEGATIVE
Gonorrhea: NEGATIVE

## 2014-03-03 LAB — OB RESULTS CONSOLE HIV ANTIBODY (ROUTINE TESTING): HIV: NONREACTIVE

## 2014-03-03 LAB — OB RESULTS CONSOLE RPR: RPR: NONREACTIVE

## 2014-05-21 ENCOUNTER — Inpatient Hospital Stay (HOSPITAL_COMMUNITY)
Admission: AD | Admit: 2014-05-21 | Discharge: 2014-05-21 | Disposition: A | Discharge status: Home / Self Care | Payer: Medicaid Other | Source: Ambulatory Visit | Attending: Obstetrics and Gynecology | Admitting: Obstetrics and Gynecology

## 2014-05-21 ENCOUNTER — Encounter (HOSPITAL_COMMUNITY): Payer: Self-pay | Admitting: *Deleted

## 2014-05-21 DIAGNOSIS — M545 Low back pain, unspecified: Secondary | ICD-10-CM | POA: Insufficient documentation

## 2014-05-21 DIAGNOSIS — O09529 Supervision of elderly multigravida, unspecified trimester: Secondary | ICD-10-CM | POA: Insufficient documentation

## 2014-05-21 DIAGNOSIS — O9989 Other specified diseases and conditions complicating pregnancy, childbirth and the puerperium: Principal | ICD-10-CM

## 2014-05-21 DIAGNOSIS — W06XXXA Fall from bed, initial encounter: Secondary | ICD-10-CM | POA: Diagnosis not present

## 2014-05-21 DIAGNOSIS — R109 Unspecified abdominal pain: Secondary | ICD-10-CM | POA: Insufficient documentation

## 2014-05-21 DIAGNOSIS — O99891 Other specified diseases and conditions complicating pregnancy: Secondary | ICD-10-CM

## 2014-05-21 DIAGNOSIS — S3981XA Other specified injuries of abdomen, initial encounter: Secondary | ICD-10-CM

## 2014-05-21 DIAGNOSIS — O9A213 Injury, poisoning and certain other consequences of external causes complicating pregnancy, third trimester: Secondary | ICD-10-CM
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
## 2014-05-21 NOTE — Discharge Instructions (Signed)
What Do I Need to Know About Injuries During Pregnancy? °Injuries can happen during pregnancy. Minor falls and accidents usually do not harm you or your baby. However, any injury should be reported to your doctor. °WHAT CAN I DO TO PROTECT MYSELF FROM INJURIES? °· Remove rugs and loose objects on the floor. °· Wear comfortable shoes that have a good grip. Do not wear high-heeled shoes. °· Always wear your seat belt. The lap belt should be below your belly. Always practice safe driving. °· Do not ride on a motorcycle. °· Do not participate in high-impact activities or sports. °· Avoid: °¨ Walking on wet or slippery floors. °¨ Fires. °¨ Starting fires. °¨ Lifting heavy pots of boiling or hot liquids. °¨ Fixing electrical problems. °· Only take medicine as told by your doctor. °· Know your blood type and the blood type of the baby's father. °· Call your local emergency services (911 in the [place] if you are a victim of domestic violence or assault. For help and support, contact the National Domestic Violence Hotline. °WHEN SHOULD I GET HELP RIGHT AWAY? °· You fall on your belly or have any high-impact accident or injury. °· You have been a victim of domestic violence or any kind of violence. °· You have been in a car accident. °· You have bleeding from your vagina. °· Fluid is leaking from your vagina. °· You start to have belly cramping (contractions) or pain. °· You feel weak or pass out (faint). °· You start to throw up (vomit) after an injury. °· You have been burned. °· You have a stiff neck or neck pain. °· You get a headache or have vision problems after an injury. °· You do not feel the baby move or the baby is not moving as much as normal. °Document Released: 10/06/2010 Document Revised: 01/18/2014 Document Reviewed: 06/10/2013 °[employer]® Patient Information ©2015 [employer]. This information is not intended to replace advice given to you by your health care provider. Make sure you discuss any questions you  have with your health care provider. ° °

## 2014-05-21 NOTE — MAU Provider Note (Signed)
History     CSN: [id number]  Arrival date and time: 05/21/14 1131   First Provider Initiated Contact with Patient 05/21/14 1228      Chief Complaint  Patient presents with  . Fall   HPI [name] is a 38 y.o. G3P1011 at [redacted]w[redacted]d who presents to MAU today with complaint of a fall onto her abdomen from her bed this morning. She states that she took Tylenol at 0730 today with little relief. She states that pain has been better with rest. She denies vaginal bleeding, LOF or contractions today. She states mild to moderate low back pain. She reports good fetal movement.   OB History   Grav Para Term Preterm Abortions TAB SAB Ect Mult Living   3 1 1  1  1   1       Past Medical History  Diagnosis Date  . Seizures   . Anxiety   . Depression     Past Surgical History  Procedure Laterality Date  . Appendectomy    . Cesarean section      History reviewed. No pertinent family history.  History  Substance Use Topics  . Smoking status: Current Every Day Smoker -- 2.00 packs/day    Types: Cigarettes  . Smokeless tobacco: Never Used  . Alcohol Use: Yes    Allergies:  Allergies  Allergen Reactions  . Claritin [Loratadine] Other (See Comments)    causes spasms  . Diphenhydramine Other (See Comments)    Causes spasms    Prescriptions prior to admission  Medication Sig Dispense Refill  . acetaminophen (TYLENOL) 325 MG tablet Take 650 mg by mouth every 6 (six) hours as needed for mild pain.      . Prenatal Vit-Fe Fumarate-FA (PRENATAL MULTIVITAMIN) TABS tablet Take 1 tablet by mouth daily at 12 noon.        Review of Systems  Gastrointestinal: Positive for abdominal pain.  Genitourinary:       Neg - vaginal bleeding, discharge, LOF   Physical Exam   Blood pressure 116/63, pulse 67, temperature 98.2 F (36.8 C), temperature source Oral, resp. rate 18, height 4\' 10"  (1.473 m), weight 197 lb (89.359 kg), last menstrual period 10/05/2013.  Physical Exam   Constitutional: She is oriented to person, place, and time. She appears well-developed and well-nourished. No distress.  HENT:  Head: Normocephalic.  Cardiovascular: Normal rate.   Respiratory: Effort normal.  GI: Soft. She exhibits no distension and no mass. There is no tenderness. There is no rebound and no guarding.  Genitourinary: No bleeding around the vagina. No vaginal discharge found.  Neg - pooling  Neurological: She is alert and oriented to person, place, and time.  Skin: Skin is warm and dry. No erythema.  Psychiatric: She has a normal mood and affect.   Fern - negative  Fetal Monitoring: Baseline: 120 bpm, moderate variability, + accelerations, no decelerations Contractions: 4 contractions noted in 4 hours; patient denies palpable contractions  MAU Course  Procedures None  MDM Discussed with Dr. Ulanda Edison. 4 hours of fetal monitoring Fetal monitoring initiated at 1156. Will monitor until 1556 Discussed patient's NST with Dr. Ulanda Edison. Patient ok for discharge  Assessment and Plan  A: Fall in pregnancy Abdominal trauma in pregnancy  P: Discharge home Bleeding/PTL precautions discussed Patient advised to take Tylenol PRN pain Patient encouraged to follow-up with [facility] as scheduled for routine prenatal care or sooner if symptoms worsen Patient may return to MAU as needed or if her condition  were to change or worsen   Christina Redden, PA-C  05/21/2014, 12:28 PM

## 2014-05-26 ENCOUNTER — Encounter: Payer: Medicaid Other | Attending: Obstetrics and Gynecology

## 2014-05-26 VITALS — Ht 58.5 in | Wt 196.4 lb

## 2014-05-26 DIAGNOSIS — O99810 Abnormal glucose complicating pregnancy: Secondary | ICD-10-CM

## 2014-05-26 DIAGNOSIS — Z713 Dietary counseling and surveillance: Secondary | ICD-10-CM | POA: Insufficient documentation

## 2014-06-01 NOTE — Progress Notes (Signed)
  Patient was seen on 05/26/14 for Gestational Diabetes self-management . The following learning objectives were met by the patient :   States the definition of Gestational Diabetes  States why dietary management is important in controlling blood glucose  Describes the effects of carbohydrates on blood glucose levels  Demonstrates ability to create a balanced meal plan  Demonstrates carbohydrate counting   States when to check blood glucose levels  Demonstrates proper blood glucose monitoring techniques  States the effect of stress and exercise on blood glucose levels  States the importance of limiting caffeine and abstaining from alcohol and smoking  Plan:  Aim for 2 Carb Choices per meal (30 grams) +/- 1 either way for breakfast Aim for 3 Carb Choices per meal (45 grams) +/- 1 either way from lunch and dinner Aim for 1-2 Carbs per snack Begin reading food labels for Total Carbohydrate and sugar grams of foods Consider  increasing your activity level by walking daily as tolerated Begin checking BG before breakfast and 1-2 hours after first bit of breakfast, lunch and dinner after  as directed by MD  Take medication  as directed by MD  Blood glucose monitor given: Accu Chek Nano BG Monitoring Kit Lot # [id number] Exp: 05/18/15 Blood glucose reading: 167 Following large bottle of Gatorade  Patient instructed to monitor glucose levels: FBS: 60 - <90 1 hour: <140 2 hour: <120  Patient received the following handouts:  Nutrition Diabetes and Pregnancy  Carbohydrate Counting List  Meal Planning worksheet  Patient will be seen for follow-up as needed.

## 2014-07-01 ENCOUNTER — Encounter (HOSPITAL_COMMUNITY): Payer: Self-pay

## 2014-07-12 ENCOUNTER — Encounter (HOSPITAL_COMMUNITY)
Admission: RE | Admit: 2014-07-12 | Discharge: 2014-07-12 | Disposition: A | Discharge status: Home / Self Care | Payer: Medicaid Other | Source: Ambulatory Visit | Attending: Obstetrics and Gynecology | Admitting: Obstetrics and Gynecology

## 2014-07-12 ENCOUNTER — Encounter (HOSPITAL_COMMUNITY): Payer: Self-pay

## 2014-07-12 HISTORY — DX: Essential (primary) hypertension: I10

## 2014-07-12 LAB — BASIC METABOLIC PANEL
Anion gap: 11 (ref 5–15)
BUN: 7 mg/dL (ref 6–23)
CALCIUM: 9.5 mg/dL (ref 8.4–10.5)
CO2: 24 mEq/L (ref 19–32)
CREATININE: 0.64 mg/dL (ref 0.50–1.10)
Chloride: 102 mEq/L (ref 96–112)
GFR calc Af Amer: >90 mL/min (ref >90)
GFR calc non Af Amer: >90 mL/min (ref >90)
GLUCOSE: 83 mg/dL (ref 70–99)
Potassium: 4.1 mEq/L (ref 3.7–5.3)
Sodium: 137 mEq/L (ref 137–147)

## 2014-07-12 LAB — CBC
HCT: 34.7 % — ABNORMAL LOW (ref 36.0–46.0)
Hemoglobin: 11.7 g/dL — ABNORMAL LOW (ref 12.0–15.0)
MCH: 27.5 pg (ref 26.0–34.0)
MCHC: 33.7 g/dL (ref 30.0–36.0)
MCV: 81.5 fL (ref 78.0–100.0)
Platelets: 228 10*3/uL (ref 150–400)
RBC: 4.26 MIL/uL (ref 3.87–5.11)
RDW: 13.6 % (ref 11.5–15.5)
WBC: 8.7 10*3/uL (ref 4.0–10.5)

## 2014-07-12 LAB — TYPE AND SCREEN
ABO/RH(D): A POS
Antibody Screen: NEGATIVE

## 2014-07-12 LAB — ABO/RH: ABO/RH(D): A POS

## 2014-07-12 LAB — RPR

## 2014-07-12 NOTE — Patient Instructions (Signed)
Your procedure is scheduled on: 07/14/14  Enter through the Main Entrance at : 6am Pick up desk phone and dial [phone] and inform us of your arrival.  Please call [phone] if you have any problems the morning of surgery.  Remember: Do not eat food or drink liquids, including water, after midnight:Tuesday   You may brush your teeth the morning of surgery.   DO NOT wear jewelry, eye make-up, lipstick,body lotion, or dark fingernail polish.  (Polished toes are ok) You may wear deodorant.  If you are to be admitted after surgery, leave suitcase in car until your room has been assigned. Patients discharged on the day of surgery will not be allowed to drive home. Wear loose fitting, comfortable clothes for your ride home.

## 2014-07-13 ENCOUNTER — Encounter (HOSPITAL_COMMUNITY): Payer: Self-pay | Admitting: Anesthesiology

## 2014-07-13 NOTE — Anesthesia Preprocedure Evaluation (Addendum)
Anesthesia Evaluation  Patient identified by MRN, date of birth, ID band Patient awake    Reviewed: Allergy & Precautions, H&P , NPO status , Patient's Chart, lab work & pertinent test results  Airway Mallampati: II  TM Distance: >3 FB Neck ROM: Full    Dental no notable dental hx. (+) Teeth Intact   Pulmonary Current Smoker,  breath sounds clear to auscultation  Pulmonary exam normal       Cardiovascular hypertension, negative cardio ROS  Rhythm:Regular Rate:Normal     Neuro/Psych Seizures -,  PSYCHIATRIC DISORDERS Anxiety Depression Last in 2001    GI/Hepatic negative GI ROS, Neg liver ROS,   Endo/Other  diabetes, Well ControlledMorbid obesity  Renal/GU negative Renal ROS  negative genitourinary   Musculoskeletal negative musculoskeletal ROS (+)   Abdominal (+) + obese,   Peds  Hematology  (+) anemia ,   Anesthesia Other Findings   Reproductive/Obstetrics (+) Pregnancy AMA Previous C/Section                            Anesthesia Physical Anesthesia Plan  ASA: III  Anesthesia Plan: Spinal   Post-op Pain Management:    Induction:   Airway Management Planned: Natural Airway  Additional Equipment:   Intra-op Plan:   Post-operative Plan:   Informed Consent: I have reviewed the patients History and Physical, chart, labs and discussed the procedure including the risks, benefits and alternatives for the proposed anesthesia with the patient or authorized representative who has indicated his/her understanding and acceptance.   Dental advisory given  Plan Discussed with: Anesthesiologist, CRNA and Surgeon  Anesthesia Plan Comments:        Anesthesia Quick Evaluation

## 2014-07-13 NOTE — H&P (Signed)
Christina [name] is a 38 y.o. female, G3 P1011, EGA 39+ weeks with [facility] 11-4 presenting for repeat c-section.  Previous c-section, CTOL but desires repeat c-section.  A1GDM well controlled with diet, AMA with low risk Panorama, no other complications.  See prenatal records for complete history.  Maternal Medical History:  Fetal activity: Perceived fetal activity is normal.    Prenatal Complications - Diabetes: gestational. Diabetes is managed by diet.      OB History   Grav Para Term Preterm Abortions TAB SAB Ect Mult Living   3 1 1  1  1   1     LTCS with one layer closure, maternal request  Past Medical History  Diagnosis Date  . Anxiety   . Depression   . Seizures 2001    stressed induced  . Hypertension     on meds until wt loss  . Gestational diabetes mellitus, antepartum 2015    managed with diet   Past Surgical History  Procedure Laterality Date  . Appendectomy    . Cesarean section     Family History: family history is not on file. Social History:  reports that she has been smoking Cigarettes.  She has been smoking about 0.25 packs per day. She has never used smokeless tobacco. She reports that she drinks alcohol. She reports that she uses illicit drugs (Marijuana).   Prenatal Transfer Tool  Maternal Diabetes: Yes:  Diabetes Type:  Diet controlled Genetic Screening: Normal Maternal Ultrasounds/Referrals: Normal Fetal Ultrasounds or other Referrals:  None Maternal Substance Abuse:  No Significant Maternal Medications:  None Significant Maternal Lab Results:  Lab values include: Group B Strep negative Other Comments:  None  Review of Systems  Respiratory: Negative.   Cardiovascular: Negative.       Last menstrual period 10/05/2013. Maternal Exam:  Abdomen: Patient reports no abdominal tenderness. Surgical scars: low transverse.   Estimated fetal weight is 7 1/2 lbs.   Fetal presentation: vertex  Introitus: Normal vulva. Normal vagina.    Physical Exam   Constitutional: She appears well-developed and well-nourished.  Neck: Neck supple. No thyromegaly present.  Cardiovascular: Normal rate, regular rhythm and normal heart sounds.   No murmur heard. Respiratory: Effort normal and breath sounds normal. No respiratory distress. She has no wheezes.  GI: Soft.    Prenatal labs: ABO, Rh: --/--/A POS, A POS (10/26 1130) Antibody: NEG (10/26 1130) Rubella: Immune (06/17 0000) RPR: NON REAC (10/26 1130)  HBsAg: Negative (06/17 0000)  HIV: Non-reactive (06/17 0000)  GBS:   neg GCT:  141, GTT abnl  Assessment/Plan: IUP at 39 weeks with previous c-section for repeat c-section.  GDM well controlled with diet.  C-section procedure and risks have been discussed.   Christina [name] D 07/13/2014, 7:28 PM

## 2014-07-14 ENCOUNTER — Encounter (HOSPITAL_COMMUNITY): Payer: Medicaid Other | Admitting: Anesthesiology

## 2014-07-14 ENCOUNTER — Encounter (HOSPITAL_COMMUNITY)
Admission: RE | Disposition: A | Discharge status: Home / Self Care | Payer: Self-pay | Source: Ambulatory Visit | Attending: Obstetrics and Gynecology

## 2014-07-14 ENCOUNTER — Inpatient Hospital Stay (HOSPITAL_COMMUNITY)
Admission: RE | Admit: 2014-07-14 | Discharge: 2014-07-16 | DRG: 765 | Disposition: A | Discharge status: Home / Self Care | Payer: Medicaid Other | Source: Ambulatory Visit | Attending: Obstetrics and Gynecology | Admitting: Obstetrics and Gynecology

## 2014-07-14 ENCOUNTER — Encounter (HOSPITAL_COMMUNITY): Payer: Self-pay

## 2014-07-14 ENCOUNTER — Inpatient Hospital Stay (HOSPITAL_COMMUNITY): Payer: Medicaid Other | Admitting: Anesthesiology

## 2014-07-14 DIAGNOSIS — Z3A39 39 weeks gestation of pregnancy: Secondary | ICD-10-CM | POA: Diagnosis present

## 2014-07-14 DIAGNOSIS — O99323 Drug use complicating pregnancy, third trimester: Secondary | ICD-10-CM | POA: Diagnosis present

## 2014-07-14 DIAGNOSIS — O3421 Maternal care for scar from previous cesarean delivery: Secondary | ICD-10-CM | POA: Diagnosis present

## 2014-07-14 DIAGNOSIS — O24420 Gestational diabetes mellitus in childbirth, diet controlled: Secondary | ICD-10-CM | POA: Diagnosis present

## 2014-07-14 DIAGNOSIS — F17210 Nicotine dependence, cigarettes, uncomplicated: Secondary | ICD-10-CM | POA: Diagnosis present

## 2014-07-14 DIAGNOSIS — O09523 Supervision of elderly multigravida, third trimester: Secondary | ICD-10-CM | POA: Diagnosis not present

## 2014-07-14 DIAGNOSIS — O1002 Pre-existing essential hypertension complicating childbirth: Secondary | ICD-10-CM | POA: Diagnosis present

## 2014-07-14 DIAGNOSIS — F1290 Cannabis use, unspecified, uncomplicated: Secondary | ICD-10-CM | POA: Diagnosis present

## 2014-07-14 DIAGNOSIS — O99334 Smoking (tobacco) complicating childbirth: Secondary | ICD-10-CM | POA: Diagnosis present

## 2014-07-14 DIAGNOSIS — O99214 Obesity complicating childbirth: Secondary | ICD-10-CM | POA: Diagnosis present

## 2014-07-14 DIAGNOSIS — Z6841 Body Mass Index (BMI) 40.0 and over, adult: Secondary | ICD-10-CM | POA: Diagnosis not present

## 2014-07-14 DIAGNOSIS — Z98891 History of uterine scar from previous surgery: Secondary | ICD-10-CM

## 2014-07-14 HISTORY — DX: Other specified postprocedural states: Z98.890

## 2014-07-14 HISTORY — DX: Other specified postprocedural states: R11.2

## 2014-07-14 LAB — GLUCOSE, CAPILLARY: Glucose-Capillary: 95 mg/dL (ref 70–99)

## 2014-07-14 SURGERY — Surgical Case
Anesthesia: Spinal | Site: Abdomen

## 2014-07-14 MED ORDER — KETOROLAC TROMETHAMINE 30 MG/ML IJ SOLN: 30.0000 mg | Freq: Four times a day (QID) | INTRAMUSCULAR | Status: AC | PRN

## 2014-07-14 MED ORDER — PHENYLEPHRINE 8 MG IN D5W 100 ML (0.08MG/ML) PREMIX OPTIME
INJECTION | INTRAVENOUS | Status: AC
  Filled 2014-07-14: qty 100

## 2014-07-14 MED ORDER — ONDANSETRON HCL 4 MG PO TABS: 4.0000 mg | ORAL_TABLET | ORAL | Status: DC | PRN

## 2014-07-14 MED ORDER — NALOXONE HCL 0.4 MG/ML IJ SOLN: 0.4000 mg | INTRAMUSCULAR | Status: DC | PRN

## 2014-07-14 MED ORDER — ONDANSETRON HCL 4 MG/2ML IJ SOLN: 4.0000 mg | INTRAMUSCULAR | Status: DC | PRN

## 2014-07-14 MED ORDER — SCOPOLAMINE 1 MG/3DAYS TD PT72
MEDICATED_PATCH | TRANSDERMAL | Status: AC
  Administered 2014-07-14: 1.5 mg via TRANSDERMAL
  Filled 2014-07-14: qty 1

## 2014-07-14 MED ORDER — TETANUS-DIPHTH-ACELL PERTUSSIS 5-2.5-18.5 LF-MCG/0.5 IM SUSP: 0.5000 mL | Freq: Once | INTRAMUSCULAR | Status: DC

## 2014-07-14 MED ORDER — FENTANYL CITRATE 0.05 MG/ML IJ SOLN
INTRAMUSCULAR | Status: AC
  Filled 2014-07-14: qty 2

## 2014-07-14 MED ORDER — PHENYLEPHRINE 40 MCG/ML (10ML) SYRINGE FOR IV PUSH (FOR BLOOD PRESSURE SUPPORT)
PREFILLED_SYRINGE | INTRAVENOUS | Status: AC
  Filled 2014-07-14: qty 5

## 2014-07-14 MED ORDER — NALBUPHINE HCL 10 MG/ML IJ SOLN: 5.0000 mg | Freq: Once | INTRAMUSCULAR | Status: AC | PRN

## 2014-07-14 MED ORDER — MEPERIDINE HCL 25 MG/ML IJ SOLN: 6.2500 mg | INTRAMUSCULAR | Status: DC | PRN

## 2014-07-14 MED ORDER — LACTATED RINGERS IV SOLN
Freq: Once | INTRAVENOUS | Status: AC
  Administered 2014-07-14 (×3): via INTRAVENOUS

## 2014-07-14 MED ORDER — NALBUPHINE HCL 10 MG/ML IJ SOLN: 5.0000 mg | INTRAMUSCULAR | Status: DC | PRN

## 2014-07-14 MED ORDER — CEFAZOLIN SODIUM-DEXTROSE 2-3 GM-% IV SOLR
INTRAVENOUS | Status: AC
  Filled 2014-07-14: qty 50

## 2014-07-14 MED ORDER — KETOROLAC TROMETHAMINE 30 MG/ML IJ SOLN
INTRAMUSCULAR | Status: AC
  Administered 2014-07-14: 30 mg via INTRAMUSCULAR
  Filled 2014-07-14: qty 1

## 2014-07-14 MED ORDER — SODIUM CHLORIDE 0.9 % IJ SOLN: 3.0000 mL | INTRAMUSCULAR | Status: DC | PRN

## 2014-07-14 MED ORDER — PHENYLEPHRINE 8 MG IN D5W 100 ML (0.08MG/ML) PREMIX OPTIME: INJECTION | INTRAVENOUS | Status: DC | PRN

## 2014-07-14 MED ORDER — MEASLES, MUMPS & RUBELLA VAC ~~LOC~~ INJ: 0.5000 mL | INJECTION | Freq: Once | SUBCUTANEOUS | Status: DC

## 2014-07-14 MED ORDER — WITCH HAZEL-GLYCERIN EX PADS: 1.0000 "application " | MEDICATED_PAD | CUTANEOUS | Status: DC | PRN

## 2014-07-14 MED ORDER — SENNOSIDES-DOCUSATE SODIUM 8.6-50 MG PO TABS
2.0000 | ORAL_TABLET | ORAL | Status: DC
  Administered 2014-07-15 (×2): 2 via ORAL
  Filled 2014-07-14 (×2): qty 2

## 2014-07-14 MED ORDER — LANOLIN HYDROUS EX OINT: 1.0000 "application " | TOPICAL_OINTMENT | CUTANEOUS | Status: DC | PRN

## 2014-07-14 MED ORDER — ONDANSETRON HCL 4 MG/2ML IJ SOLN
INTRAMUSCULAR | Status: AC
  Filled 2014-07-14: qty 2

## 2014-07-14 MED ORDER — MAGNESIUM HYDROXIDE 400 MG/5ML PO SUSP: 30.0000 mL | ORAL | Status: DC | PRN

## 2014-07-14 MED ORDER — FENTANYL CITRATE 0.05 MG/ML IJ SOLN: 25.0000 ug | INTRAMUSCULAR | Status: DC | PRN

## 2014-07-14 MED ORDER — ONDANSETRON HCL 4 MG/2ML IJ SOLN: 4.0000 mg | Freq: Three times a day (TID) | INTRAMUSCULAR | Status: DC | PRN

## 2014-07-14 MED ORDER — NALBUPHINE HCL 10 MG/ML IJ SOLN
INTRAMUSCULAR | Status: AC
  Administered 2014-07-14: 5 mg via SUBCUTANEOUS
  Filled 2014-07-14: qty 1

## 2014-07-14 MED ORDER — MENTHOL 3 MG MT LOZG: 1.0000 | LOZENGE | OROMUCOSAL | Status: DC | PRN

## 2014-07-14 MED ORDER — OXYTOCIN 10 UNIT/ML IJ SOLN
INTRAMUSCULAR | Status: AC
  Filled 2014-07-14: qty 4

## 2014-07-14 MED ORDER — MORPHINE SULFATE (PF) 0.5 MG/ML IJ SOLN
INTRAMUSCULAR | Status: DC | PRN
  Administered 2014-07-14: .15 mg via INTRATHECAL

## 2014-07-14 MED ORDER — NALBUPHINE HCL 10 MG/ML IJ SOLN
5.0000 mg | Freq: Once | INTRAMUSCULAR | Status: AC | PRN
  Administered 2014-07-14: 5 mg via SUBCUTANEOUS

## 2014-07-14 MED ORDER — OXYTOCIN 40 UNITS IN LACTATED RINGERS INFUSION - SIMPLE MED: 62.5000 mL/h | INTRAVENOUS | Status: AC

## 2014-07-14 MED ORDER — PHENYLEPHRINE 8 MG IN D5W 100 ML (0.08MG/ML) PREMIX OPTIME
INJECTION | INTRAVENOUS | Status: DC | PRN
  Administered 2014-07-14: 20 ug/min via INTRAVENOUS

## 2014-07-14 MED ORDER — METOCLOPRAMIDE HCL 5 MG/ML IJ SOLN: 10.0000 mg | Freq: Once | INTRAMUSCULAR | Status: DC | PRN

## 2014-07-14 MED ORDER — LACTATED RINGERS IV SOLN: INTRAVENOUS | Status: DC

## 2014-07-14 MED ORDER — OXYTOCIN 40 UNITS IN LACTATED RINGERS INFUSION - SIMPLE MED
INTRAVENOUS | Status: DC | PRN
  Administered 2014-07-14: 40 [IU] via INTRAVENOUS

## 2014-07-14 MED ORDER — PRENATAL MULTIVITAMIN CH
1.0000 | ORAL_TABLET | Freq: Every day | ORAL | Status: DC
  Administered 2014-07-15 – 2014-07-16 (×2): 1 via ORAL
  Filled 2014-07-14 (×2): qty 1

## 2014-07-14 MED ORDER — ONDANSETRON HCL 4 MG/2ML IJ SOLN
INTRAMUSCULAR | Status: DC | PRN
  Administered 2014-07-14: 4 mg via INTRAVENOUS

## 2014-07-14 MED ORDER — SIMETHICONE 80 MG PO CHEW
80.0000 mg | CHEWABLE_TABLET | ORAL | Status: DC | PRN
  Administered 2014-07-15 – 2014-07-16 (×2): 80 mg via ORAL
  Filled 2014-07-14 (×2): qty 1

## 2014-07-14 MED ORDER — IBUPROFEN 600 MG PO TABS
600.0000 mg | ORAL_TABLET | Freq: Four times a day (QID) | ORAL | Status: DC
  Administered 2014-07-14 – 2014-07-16 (×8): 600 mg via ORAL
  Filled 2014-07-14 (×8): qty 1

## 2014-07-14 MED ORDER — KETOROLAC TROMETHAMINE 30 MG/ML IJ SOLN
30.0000 mg | Freq: Four times a day (QID) | INTRAMUSCULAR | Status: AC | PRN
  Administered 2014-07-14: 30 mg via INTRAMUSCULAR

## 2014-07-14 MED ORDER — SCOPOLAMINE 1 MG/3DAYS TD PT72
1.0000 | MEDICATED_PATCH | Freq: Once | TRANSDERMAL | Status: DC
  Administered 2014-07-14: 1.5 mg via TRANSDERMAL

## 2014-07-14 MED ORDER — LACTATED RINGERS IV SOLN
INTRAVENOUS | Status: DC | PRN
  Administered 2014-07-14: 08:00:00 via INTRAVENOUS

## 2014-07-14 MED ORDER — FENTANYL CITRATE 0.05 MG/ML IJ SOLN
INTRAMUSCULAR | Status: DC | PRN
  Administered 2014-07-14: 25 ug via INTRATHECAL

## 2014-07-14 MED ORDER — NALOXONE HCL 1 MG/ML IJ SOLN
1.0000 ug/kg/h | INTRAVENOUS | Status: DC | PRN
  Filled 2014-07-14: qty 2

## 2014-07-14 MED ORDER — ZOLPIDEM TARTRATE 5 MG PO TABS: 5.0000 mg | ORAL_TABLET | Freq: Every evening | ORAL | Status: DC | PRN

## 2014-07-14 MED ORDER — OXYCODONE-ACETAMINOPHEN 5-325 MG PO TABS: 1.0000 | ORAL_TABLET | ORAL | Status: DC | PRN

## 2014-07-14 MED ORDER — MORPHINE SULFATE 0.5 MG/ML IJ SOLN
INTRAMUSCULAR | Status: AC
Start: 2014-07-14 — End: 2014-07-14
  Filled 2014-07-14: qty 10

## 2014-07-14 MED ORDER — BUPIVACAINE IN DEXTROSE 0.75-8.25 % IT SOLN
INTRATHECAL | Status: DC | PRN
  Administered 2014-07-14: 1.2 mL via INTRATHECAL

## 2014-07-14 MED ORDER — CEFAZOLIN SODIUM-DEXTROSE 2-3 GM-% IV SOLR
2.0000 g | INTRAVENOUS | Status: AC
  Administered 2014-07-14: 2 g via INTRAVENOUS

## 2014-07-14 MED ORDER — DIBUCAINE 1 % RE OINT
1.0000 "application " | TOPICAL_OINTMENT | RECTAL | Status: DC | PRN
  Filled 2014-07-14: qty 28

## 2014-07-14 SURGICAL SUPPLY — 32 items
CLAMP CORD UMBIL (MISCELLANEOUS) ×2 IMPLANT
CLOTH BEACON ORANGE TIMEOUT ST (SAFETY) ×3 IMPLANT
CONTAINER PREFILL 10% NBF 15ML (MISCELLANEOUS) IMPLANT
DRAPE SHEET LG 3/4 BI-LAMINATE (DRAPES) ×2 IMPLANT
DRSG OPSITE POSTOP 4X10 (GAUZE/BANDAGES/DRESSINGS) ×3 IMPLANT
DURAPREP 26ML APPLICATOR (WOUND CARE) ×3 IMPLANT
ELECT REM PT RETURN 9FT ADLT (ELECTROSURGICAL) ×3
ELECTRODE REM PT RTRN 9FT ADLT (ELECTROSURGICAL) ×1 IMPLANT
EXTRACTOR VACUUM KIWI (MISCELLANEOUS) IMPLANT
EXTRACTOR VACUUM M CUP 4 TUBE (SUCTIONS) IMPLANT
EXTRACTOR VACUUM M CUP 4' TUBE (SUCTIONS)
GLOVE ORTHO TXT STRL SZ7.5 (GLOVE) ×3 IMPLANT
GOWN STRL REUS W/TWL LRG LVL3 (GOWN DISPOSABLE) ×6 IMPLANT
KIT ABG SYR 3ML LUER SLIP (SYRINGE) IMPLANT
NDL HYPO 25X5/8 SAFETYGLIDE (NEEDLE) ×1 IMPLANT
NEEDLE HYPO 25X5/8 SAFETYGLIDE (NEEDLE) ×3 IMPLANT
NS IRRIG 1000ML POUR BTL (IV SOLUTION) ×3 IMPLANT
PACK C SECTION WH (CUSTOM PROCEDURE TRAY) ×3 IMPLANT
PAD OB MATERNITY 4.3X12.25 (PERSONAL CARE ITEMS) ×3 IMPLANT
RTRCTR C-SECT PINK 25CM LRG (MISCELLANEOUS) ×3 IMPLANT
STAPLER VISISTAT 35W (STAPLE) IMPLANT
SUT CHROMIC 1 CTX 36 (SUTURE) ×6 IMPLANT
SUT PLAIN 0 NONE (SUTURE) IMPLANT
SUT PLAIN 2 0 XLH (SUTURE) ×2 IMPLANT
SUT VIC AB 0 CT1 27 (SUTURE) ×6
SUT VIC AB 0 CT1 27XBRD ANBCTR (SUTURE) ×2 IMPLANT
SUT VIC AB 2-0 CT1 27 (SUTURE) ×3
SUT VIC AB 2-0 CT1 TAPERPNT 27 (SUTURE) ×1 IMPLANT
SUT VIC AB 4-0 KS 27 (SUTURE) ×2 IMPLANT
TOWEL OR 17X24 6PK STRL BLUE (TOWEL DISPOSABLE) ×3 IMPLANT
TRAY FOLEY CATH 14FR (SET/KITS/TRAYS/PACK) ×3 IMPLANT
WATER STERILE IRR 1000ML POUR (IV SOLUTION) ×3 IMPLANT

## 2014-07-14 NOTE — Addendum Note (Signed)
Addendum created 07/14/14 1823 by Jonna Munro, CRNA   Modules edited: Notes Section   Notes Section:  File: [id number]

## 2014-07-14 NOTE — Progress Notes (Signed)
Patient ID: Christina [name], female   DOB: [birth year], 38 y.o.   MRN: [id number] DOS VS are stable Pt is alert and without complaints except she is hungry and is awaiting her evening meal

## 2014-07-14 NOTE — Op Note (Signed)
Preoperative diagnosis: Intrauterine pregnancy at 39 weeks, previous c-section Postoperative diagnosis: Same Procedure: Repeat low transverse cesarean section without extensions Surgeon: Cheri Fowler M.D. Assistant:  Janyth Contes, MD Anesthesia: Spinal   Findings: Patient had normal gravid anatomy and delivered a viable female infant with Apgars of 9 and 9 weight pending Estimated blood loss: 800 cc Specimens: Placenta sent to labor and delivery Complications: None  Procedure in detail: The patient was taken to the operating room and placed in the sitting position. Dr. Royce Macadamia instilled spinal anesthesia.  She was then placed in the dorsosupine position with left tilt. Abdomen was then prepped and draped in the usual sterile fashion, and a foley catheter was inserted. The level of her anesthesia was found to be adequate. Abdomen was entered via a standard Pfannenstiel incision through her previous scar. Once the peritoneal cavity was entered the Alexis disposable self-retaining retractor was placed and good visualization was achieved. A 4 cm transverse incision was then made in the lower uterine segment pushing the bladder inferior. Once the uterine cavity was entered the incision was extended digitally. The fetal vertex was grasped and delivered through the incision atraumatically. Mouth and nares were suctioned. The remainder of the infant then delivered atraumatically. Cord was doubly clamped and cut and the infant handed to the awaiting pediatric team. The placenta delivered spontaneously and was sent for cord blood donation. Uterus was wiped dry with clean lap pad and all clots and debris were removed. Uterine incision was inspected and found to be free of extensions. Uterine incision was closed in 1 layer with running locking #1 Chromic. Tubes and ovaries were inspected and found to be normal. Uterine incision was inspected and found to be hemostatic. Bleeding from serosal edges was  controlled with electrocautery. The Alexis retractor was removed. Subfascial space was irrigated and made hemostatic with electrocautery. Peritoneum was closed with running 2-0 Vicryl.  Fascia was closed in running fashion starting at both ends and meeting in the middle with 0 Vicryl. Subcutaneous tissue was then irrigated and made hemostatic with electrocautery, then closed with running 2-0 plain gut. Skin was closed with running 4-0 Vicryl subcuticular followed by steri-strips and a sterile dressing. Patient tolerated the procedure well and was taken to the recovery in stable condition. Counts were correct x2, she received Ancef 2 g IV at the beginning of the procedure and she had PAS hose on throughout the procedure.

## 2014-07-14 NOTE — Transfer of Care (Signed)
Immediate Anesthesia Transfer of Care Note  Patient: Christina [name]  Procedure(s) Performed: Procedure(s): CESAREAN SECTION (N/A)  Patient Location: PACU  Anesthesia Type:Spinal  Level of Consciousness: awake, alert  and oriented  Airway & Oxygen Therapy: Patient Spontanous Breathing  Post-op Assessment: Report given to PACU RN and Post -op Vital signs reviewed and stable  Post vital signs: Reviewed and stable  Complications: No apparent anesthesia complications

## 2014-07-14 NOTE — Anesthesia Postprocedure Evaluation (Signed)
  Anesthesia Post-op Note  Patient: Christina [name]  Procedure(s) Performed: Procedure(s): CESAREAN SECTION (N/A)  Patient Location: PACU  Anesthesia Type:Spinal  Level of Consciousness: awake, alert  and oriented  Airway and Oxygen Therapy: Patient Spontanous Breathing  Post-op Pain: none  Post-op Assessment: Post-op Vital signs reviewed, Patient's Cardiovascular Status Stable, Respiratory Function Stable, No headache, No backache, No residual numbness and No residual motor weakness  Post-op Vital Signs: Reviewed and stable  Last Vitals:  Filed Vitals:   07/14/14 1606  BP: 127/53  Pulse: 52  Temp:   Resp: 20    Complications: No apparent anesthesia complications

## 2014-07-14 NOTE — Anesthesia Procedure Notes (Signed)
Spinal  Patient location during procedure: OR Start time: 07/14/2014 7:25 AM Staffing Anesthesiologist: Sherryann Frese A. Performed by: anesthesiologist  Preanesthetic Checklist Completed: patient identified, site marked, surgical consent, pre-op evaluation, timeout performed, IV checked, risks and benefits discussed and monitors and equipment checked Spinal Block Patient position: sitting Prep: site prepped and draped and DuraPrep Patient monitoring: heart rate, cardiac monitor, continuous pulse ox and blood pressure Approach: midline Location: L3-4 Injection technique: single-shot Needle Needle type: Sprotte  Needle gauge: 24 G Needle length: 9 cm Needle insertion depth: 6 cm Assessment Sensory level: T4 Events: paresthesia Additional Notes Patient tolerated procedure well. Adequate sensory level. Transient paresthesia right leg.

## 2014-07-14 NOTE — Anesthesia Postprocedure Evaluation (Signed)
  Anesthesia Post-op Note  Patient: Christina [name]  Procedure(s) Performed: Procedure(s): CESAREAN SECTION (N/A)  Patient Location: PACU  Anesthesia Type:Spinal  Level of Consciousness: awake, alert  and oriented  Airway and Oxygen Therapy: Patient Spontanous Breathing  Post-op Pain: none  Post-op Assessment: Post-op Vital signs reviewed, Patient's Cardiovascular Status Stable, Respiratory Function Stable, Patent Airway, No signs of Nausea or vomiting, Pain level controlled, No headache and No backache  Post-op Vital Signs: Reviewed and stable  Last Vitals:  Filed Vitals:   07/14/14 0925  BP:   Pulse: 50  Temp: 36.8 C  Resp:     Complications: No apparent anesthesia complications

## 2014-07-14 NOTE — Interval H&P Note (Signed)
History and Physical Interval Note:  07/14/2014 7:10 AM  Christina [name]  has presented today for surgery, with the diagnosis of Repeat C/Section, [phone]  The various methods of treatment have been discussed with the patient and family. After consideration of risks, benefits and other options for treatment, the patient has consented to  Procedure(s): CESAREAN SECTION (N/A) as a surgical intervention .  The patient's history has been reviewed, patient examined, no change in status, stable for surgery.  I have reviewed the patient's chart and labs.  Questions were answered to the patient's satisfaction.     [name] D

## 2014-07-15 ENCOUNTER — Encounter (HOSPITAL_COMMUNITY): Payer: Self-pay | Admitting: *Deleted

## 2014-07-15 LAB — CBC
HCT: 31 % — ABNORMAL LOW (ref 36.0–46.0)
Hemoglobin: 10.4 g/dL — ABNORMAL LOW (ref 12.0–15.0)
MCH: 27.5 pg (ref 26.0–34.0)
MCHC: 33.5 g/dL (ref 30.0–36.0)
MCV: 82 fL (ref 78.0–100.0)
Platelets: 199 10*3/uL (ref 150–400)
RBC: 3.78 MIL/uL — AB (ref 3.87–5.11)
RDW: 13.5 % (ref 11.5–15.5)
WBC: 7.8 10*3/uL (ref 4.0–10.5)

## 2014-07-15 LAB — BIRTH TISSUE RECOVERY COLLECTION (PLACENTA DONATION)

## 2014-07-15 MED ORDER — OXYCODONE HCL 5 MG PO TABS: 10.0000 mg | ORAL_TABLET | Freq: Four times a day (QID) | ORAL | Status: DC | PRN

## 2014-07-15 MED ORDER — OXYCODONE HCL 5 MG PO TABS: 5.0000 mg | ORAL_TABLET | ORAL | Status: DC | PRN

## 2014-07-15 MED ORDER — ACETAMINOPHEN 500 MG PO TABS
1000.0000 mg | ORAL_TABLET | Freq: Four times a day (QID) | ORAL | Status: DC | PRN
  Administered 2014-07-15: 1000 mg via ORAL
  Filled 2014-07-15: qty 2
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
## 2014-07-15 NOTE — Progress Notes (Signed)
Subjective: Postpartum Day #1: Cesarean Delivery Patient reports incisional pain, tolerating PO and no problems voiding.    Objective: Vital signs in last 24 hours: Temp:  [97.8 F (36.6 C)-98.3 F (36.8 C)] 98.2 F (36.8 C) (10/29 0507) Pulse Rate:  [47-62] 54 (10/29 0507) Resp:  [18-34] 18 (10/29 0507) BP: (103-143)/(48-99) 111/55 mmHg (10/29 0507) SpO2:  [98 %-100 %] 99 % (10/29 0507) Weight:  [95.255 kg (210 lb)] 95.255 kg (210 lb) (10/28 0900)  Physical Exam:  General: alert Lochia: appropriate Uterine Fundus: firm Incision: dressing intact   Recent Labs  07/12/14 1130 07/15/14 0610  HGB 11.7* 10.4*  HCT 34.7* 31.0*    Assessment/Plan: Status post Cesarean section. Doing well postoperatively.  Continue current care, ambulate.  Akash Winski D 07/15/2014, 8:23 AM

## 2014-07-15 NOTE — Lactation Note (Signed)
This note was copied from the chart of [place]. Lactation Consultation Note Mom has LARGE pendulum breast w/small flat nipples that are red. Mom states red from pumping. Using DEBP and has manual as well. Encourage to use hand pump to pull nipple out prior to appling NS. Fitted w/#20 NS. Noted edema to breast and nipples, taught reverse pressure. Mom stated that she had to use a NS w/her first child, only BF 1 month and her milk dried up. Explained supply and demand, how 1st 2 weeks lays foundation of milk supply, keep using DEBP to stimulate breast. Baby has no interest at this time in BF. Will not suck on gloved finger, may chew occasionally. Has under bite. Will not put tongue past gum or cup under finger, tongue thrust. Attempted to feed w/slow flow nipple, mainly had to squirt colostrum in mouth and did suck training w/nipple. Finally used curved tip syring and gloved finger and baby chewed and would swallow colostrum, hasn't learned to suck yet. Explained to mom how to do suck training until the baby learns how to do it. Mom has good colostrum flow, is pumping and saving colostrum to give baby. Mom will try next feeding using NS. Mom shown how to use DEBP & how to disassemble, clean, & reassemble parts.Mom knows to pump q3h for 15-20 min.Hand expression taught to Mom. Mom encouraged to waken baby for feeds. Mom taught how to apply & clean nipple shield. Mom encouraged to do skin-to-skin. Patient Name: Christina [name] Date: 07/15/2014 Reason for consult: Follow-up assessment;Difficult latch;Infant weight loss   Maternal Data    Feeding Feeding Type: Bottle Fed - Breast Milk  LATCH Score/Interventions Latch: Too sleepy or reluctant, no latch achieved, no sucking elicited. Intervention(s): Skin to skin;Teach feeding cues;Waking techniques  Audible Swallowing: None Intervention(s): Hand expression  Type of Nipple: Flat Intervention(s): Reverse pressure;Shells;Hand pump;Double  electric pump  Comfort (Breast/Nipple): Filling, red/small blisters or bruises, mild/mod discomfort  Problem noted: Mild/Moderate discomfort Interventions (Mild/moderate discomfort): Comfort gels;Breast shields;Pre-pump if needed;Post-pump;Reverse pressue;Hand expression;Hand massage  Hold (Positioning): Assistance needed to correctly position infant at breast and maintain latch. Intervention(s): Breastfeeding basics reviewed;Support Pillows;Position options;Skin to skin  LATCH Score: 3  Lactation Tools Discussed/Used Tools: Nipple Shields;Pump;Comfort gels Nipple shield size: 20 Breast pump type: Double-Electric Breast Pump Pump Review: Setup, frequency, and cleaning;Milk Storage Initiated by:: RN/L. Von Inscoe RN   Consult Status Consult Status: Follow-up Date: 07/15/14 Follow-up type: In-patient    Audel Coakley, [place]ta [place] 07/15/2014, 2:58 AM

## 2014-07-15 NOTE — Lactation Note (Signed)
This note was copied from the chart of [place]. Lactation Consultation Note  Patient Name: Christina [name] [id number] Date: 07/15/2014 Reason for consult: Follow-up assessment of this mom and baby, per discussion with RN, Marissa who reports that mom has decided to pump and feed ebm by bottle   Maternal Data Formula Feeding for Exclusion: Yes Reason for exclusion: Mother's choice to formula and breast feed on admission  Feeding Feeding Type: Formula  LATCH Score/Interventions              N/A - mom bottle-feeding ebm        Lactation Tools Discussed/Used   LC saw mom early am and follow-up is per staff or patient request until 07/16/2014  Consult Status Consult Status: Follow-up Date: 07/16/14 Follow-up type: In-patient    Junious Dresser [facility] 07/15/2014, 5:18 PM

## 2014-07-15 NOTE — Plan of Care (Signed)
Problem: Discharge Progression Outcomes Goal: MMR given as ordered Outcome: Not Applicable Date Met:  72/55/00 Rubella immune

## 2014-07-15 NOTE — Progress Notes (Signed)
UR chart review completed.  

## 2014-07-16 MED ORDER — IBUPROFEN 600 MG PO TABS: 600.0000 mg | ORAL_TABLET | Freq: Four times a day (QID) | ORAL | Status: DC

## 2014-07-16 NOTE — Discharge Summary (Signed)
Obstetric Discharge Summary Reason for Admission: cesarean section Prenatal Procedures: none Intrapartum Procedures: cesarean: low cervical, transverse Postpartum Procedures: none Complications-Operative and Postpartum: none Hemoglobin  Date Value Ref Range Status  07/15/2014 10.4* 12.0 - 15.0 g/dL Final     HCT  Date Value Ref Range Status  07/15/2014 31.0* 36.0 - 46.0 % Final    Physical Exam:  General: alert Lochia: appropriate Uterine Fundus: firm Incision: healing well   Discharge Diagnoses: Term Pregnancy-delivered  Discharge Information: Date: 07/16/2014 Activity: pelvic rest and no strenuous activity Diet: routine Medications: Ibuprofen Condition: stable Instructions: refer to practice specific booklet Discharge to: home Follow-up Information   Follow up with Bindi Klomp D, MD. Schedule an appointment as soon as possible for a visit in 2 weeks.   Specialty:  Obstetrics and Gynecology   Contact information:   [address], [place] [address] [phone]       Newborn Data: Live born female  Birth Weight: 6 lb 7.3 oz (2929 g) APGAR: 9, 9  Home with mother.  Semiah Konczal D 07/16/2014, 8:31 AM

## 2014-07-16 NOTE — Progress Notes (Signed)
POD #2 Doing well, wants to go home Afeb, VSS Abd- soft, Fundus firm, incision intact D/c home

## 2014-07-16 NOTE — Discharge Instructions (Signed)
As per discharge pamphlet °

## 2014-07-19 ENCOUNTER — Encounter (HOSPITAL_COMMUNITY): Payer: Self-pay | Admitting: *Deleted

## 2015-04-30 ENCOUNTER — Encounter (HOSPITAL_COMMUNITY): Payer: Self-pay

## 2015-04-30 ENCOUNTER — Inpatient Hospital Stay (HOSPITAL_COMMUNITY)
Admission: AD | Admit: 2015-04-30 | Discharge: 2015-05-01 | Disposition: A | Discharge status: Home / Self Care | Payer: Medicaid Other | Source: Ambulatory Visit | Attending: Obstetrics and Gynecology | Admitting: Obstetrics and Gynecology

## 2015-04-30 DIAGNOSIS — Z3A34 34 weeks gestation of pregnancy: Secondary | ICD-10-CM | POA: Insufficient documentation

## 2015-04-30 DIAGNOSIS — O4703 False labor before 37 completed weeks of gestation, third trimester: Secondary | ICD-10-CM

## 2015-04-30 NOTE — MAU Note (Signed)
Cramps and contractions since early this morning.  Low back pain also and lying down didn't help.  Felt nausea today.  Baby moving well.  No bleeding.  C/S scheduled Sept 16 th, repeat.

## 2015-05-01 ENCOUNTER — Encounter (HOSPITAL_COMMUNITY): Payer: Self-pay | Admitting: *Deleted

## 2015-05-01 LAB — URINALYSIS, ROUTINE W REFLEX MICROSCOPIC
Bilirubin Urine: NEGATIVE
GLUCOSE, UA: NEGATIVE mg/dL
Ketones, ur: NEGATIVE mg/dL
LEUKOCYTES UA: NEGATIVE
NITRITE: NEGATIVE
Protein, ur: NEGATIVE mg/dL
Specific Gravity, Urine: 1.02 (ref 1.005–1.030)
UROBILINOGEN UA: 0.2 mg/dL (ref 0.0–1.0)
pH: 6 (ref 5.0–8.0)

## 2015-05-01 LAB — URINE MICROSCOPIC-ADD ON

## 2015-05-01 MED ORDER — PROMETHAZINE HCL 25 MG RE SUPP
25.0000 mg | Freq: Once | RECTAL | Status: AC
  Administered 2015-05-01: 25 mg via RECTAL
  Filled 2015-05-01: qty 1

## 2015-05-01 MED ORDER — TERBUTALINE SULFATE 1 MG/ML IJ SOLN
0.2500 mg | Freq: Once | INTRAMUSCULAR | Status: AC
  Administered 2015-05-01: 0.25 mg via SUBCUTANEOUS
  Filled 2015-05-01: qty 1

## 2015-05-01 NOTE — Progress Notes (Signed)
FHT from early this am reviewed.  Reactive NST, no significant decels, irreg ctx.

## 2015-05-01 NOTE — Discharge Instructions (Signed)

## 2015-05-01 NOTE — MAU Provider Note (Signed)
  History     CSN: [id number]  Arrival date and time: 04/30/15 2339   None     Chief Complaint  Patient presents with  . Abdominal Cramping  . Contractions   HPI  Christina [name] 39 y.o. [id number] @ [redacted]w[redacted]d presents to the MAU stating that she has been having contractions since 900am today. She denies vaginal bleeding , LOF. Reports positive fetal movement.  Past Medical History  Diagnosis Date  . Anxiety   . Depression   . Seizures 2001    stressed induced  . Hypertension     on meds until wt loss  . Gestational diabetes mellitus, antepartum 2015    managed with diet  . PONV (postoperative nausea and vomiting)     Past Surgical History  Procedure Laterality Date  . Appendectomy    . Cesarean section    . Cesarean section N/A 07/14/2014    Procedure: CESAREAN SECTION;  Surgeon: Cheri Fowler, MD;  Location: [facility];  Service: Obstetrics;  Laterality: N/A;    No family history on file.  Social History  Substance Use Topics  . Smoking status: Current Some Day Smoker -- 0.25 packs/day    Types: Cigarettes  . Smokeless tobacco: Never Used  . Alcohol Use: Yes     Comment: rarely    Allergies:  Allergies  Allergen Reactions  . Claritin [Loratadine] Other (See Comments)    causes spasms  . Diphenhydramine Other (See Comments)    Causes spasms and keeps alert for days    Prescriptions prior to admission  Medication Sig Dispense Refill Last Dose  . acetaminophen (TYLENOL) 500 MG tablet Take 500 mg by mouth as needed for mild pain.   Past Week at Unknown time  . Prenatal Vit-Fe Fumarate-FA (PRENATAL MULTIVITAMIN) TABS tablet Take 1 tablet by mouth daily at 12 noon.   Past Week at Unknown time  . ibuprofen (ADVIL,MOTRIN) 600 MG tablet Take 1 tablet (600 mg total) by mouth every 6 (six) hours. 30 tablet 0 More than a month at Unknown time    Review of Systems  Constitutional: Negative for fever.  Gastrointestinal: Positive for nausea and abdominal pain.  All other  systems reviewed and are negative.  Physical Exam   Blood pressure 131/80, pulse 80, temperature 97.9 F (36.6 C), temperature source Oral, resp. rate 18, height 4\' 11"  (1.499 m), weight 98.431 kg (217 lb), SpO2 99 %, not currently breastfeeding.  Physical Exam  Nursing note and vitals reviewed. Constitutional: She is oriented to person, place, and time. She appears well-developed and well-nourished. No distress.  HENT:  Head: Normocephalic.  Neck: Normal range of motion.  Cardiovascular: Normal rate.   Respiratory: Effort normal.  GI: Soft.  Musculoskeletal: Normal range of motion.  Neurological: She is alert and oriented to person, place, and time.  Skin: Skin is warm and dry.  Psychiatric: She has a normal mood and affect. Her behavior is normal. Judgment and thought content normal.   Dilation: Closed Effacement (%): 50 Exam by:: Bless Lisenby, CNM  MAU Course  Procedures  MDM Phenergan suppositiory for nausea. EFM FHR reactive Cat 1; Irregular contractions. Spoke to Dr Willis Modena and will give one dose of Terbutaline and pt can be discharged if contractions decrease.  Assessment and Plan  Preterm contractions  Discharge to home   [facility] 05/01/2015, 12:38 AM

## 2015-05-01 NOTE — MAU Note (Addendum)
PT  SAYS SHE STARTED  FEELING  UC AT 0900.    NOW   FEEL  STRONGER.   LAST SEX-  1 WEEK  AGO.     SAW  DR Willis Modena  ON  Monday.  DENIES HSV AND MRSA.. . [facility]  FOR  C/S  ON 9-16

## 2015-05-23 ENCOUNTER — Inpatient Hospital Stay (HOSPITAL_COMMUNITY)
Admission: AD | Admit: 2015-05-23 | Discharge: 2015-05-27 | DRG: 765 | Disposition: A | Discharge status: Home / Self Care | Payer: Medicaid Other | Source: Ambulatory Visit | Attending: Obstetrics and Gynecology | Admitting: Obstetrics and Gynecology

## 2015-05-23 ENCOUNTER — Inpatient Hospital Stay (HOSPITAL_COMMUNITY): Payer: Medicaid Other | Admitting: Anesthesiology

## 2015-05-23 ENCOUNTER — Encounter (HOSPITAL_COMMUNITY): Payer: Self-pay | Admitting: Anesthesiology

## 2015-05-23 ENCOUNTER — Encounter (HOSPITAL_COMMUNITY)
Admission: AD | Disposition: A | Discharge status: Home / Self Care | Payer: Self-pay | Source: Ambulatory Visit | Attending: Obstetrics and Gynecology

## 2015-05-23 ENCOUNTER — Inpatient Hospital Stay (HOSPITAL_COMMUNITY)
Admission: AD | Admit: 2015-05-23 | Discharge: 2015-05-23 | Disposition: A | Discharge status: Home / Self Care | Payer: Medicaid Other | Source: Ambulatory Visit | Attending: Obstetrics and Gynecology | Admitting: Obstetrics and Gynecology

## 2015-05-23 ENCOUNTER — Encounter (HOSPITAL_COMMUNITY): Payer: Self-pay | Admitting: *Deleted

## 2015-05-23 DIAGNOSIS — O09523 Supervision of elderly multigravida, third trimester: Secondary | ICD-10-CM | POA: Diagnosis not present

## 2015-05-23 DIAGNOSIS — O99214 Obesity complicating childbirth: Secondary | ICD-10-CM | POA: Diagnosis present

## 2015-05-23 DIAGNOSIS — Z3A37 37 weeks gestation of pregnancy: Secondary | ICD-10-CM | POA: Diagnosis present

## 2015-05-23 DIAGNOSIS — O3421 Maternal care for scar from previous cesarean delivery: Secondary | ICD-10-CM | POA: Diagnosis present

## 2015-05-23 DIAGNOSIS — O99334 Smoking (tobacco) complicating childbirth: Secondary | ICD-10-CM | POA: Diagnosis present

## 2015-05-23 DIAGNOSIS — R109 Unspecified abdominal pain: Secondary | ICD-10-CM | POA: Diagnosis present

## 2015-05-23 DIAGNOSIS — F17210 Nicotine dependence, cigarettes, uncomplicated: Secondary | ICD-10-CM | POA: Diagnosis present

## 2015-05-23 DIAGNOSIS — Z6841 Body Mass Index (BMI) 40.0 and over, adult: Secondary | ICD-10-CM

## 2015-05-23 DIAGNOSIS — Z3493 Encounter for supervision of normal pregnancy, unspecified, third trimester: Secondary | ICD-10-CM | POA: Insufficient documentation

## 2015-05-23 DIAGNOSIS — Z98891 History of uterine scar from previous surgery: Secondary | ICD-10-CM

## 2015-05-23 LAB — CBC
HCT: 32.1 % — ABNORMAL LOW (ref 36.0–46.0)
Hemoglobin: 10.7 g/dL — ABNORMAL LOW (ref 12.0–15.0)
MCH: 26.4 pg (ref 26.0–34.0)
MCHC: 33.3 g/dL (ref 30.0–36.0)
MCV: 79.3 fL (ref 78.0–100.0)
PLATELETS: 218 10*3/uL (ref 150–400)
RBC: 4.05 MIL/uL (ref 3.87–5.11)
RDW: 14.4 % (ref 11.5–15.5)
WBC: 15.5 10*3/uL — ABNORMAL HIGH (ref 4.0–10.5)

## 2015-05-23 LAB — URINE MICROSCOPIC-ADD ON

## 2015-05-23 LAB — URINALYSIS, ROUTINE W REFLEX MICROSCOPIC
Bilirubin Urine: NEGATIVE
Glucose, UA: NEGATIVE mg/dL
Ketones, ur: NEGATIVE mg/dL
Nitrite: NEGATIVE
Protein, ur: NEGATIVE mg/dL
Specific Gravity, Urine: 1.025 (ref 1.005–1.030)
UROBILINOGEN UA: 2 mg/dL — AB (ref 0.0–1.0)
pH: 6 (ref 5.0–8.0)

## 2015-05-23 SURGERY — Surgical Case
Anesthesia: Regional | Site: Abdomen

## 2015-05-23 MED ORDER — SODIUM CHLORIDE 0.9 % IJ SOLN: 9.0000 mL | INTRAMUSCULAR | Status: DC | PRN

## 2015-05-23 MED ORDER — TETANUS-DIPHTH-ACELL PERTUSSIS 5-2.5-18.5 LF-MCG/0.5 IM SUSP
0.5000 mL | Freq: Once | INTRAMUSCULAR | Status: DC
Start: 2015-05-24 — End: 2015-05-27

## 2015-05-23 MED ORDER — SUCCINYLCHOLINE CHLORIDE 20 MG/ML IJ SOLN
INTRAMUSCULAR | Status: AC
  Filled 2015-05-23: qty 1

## 2015-05-23 MED ORDER — SCOPOLAMINE 1 MG/3DAYS TD PT72
MEDICATED_PATCH | TRANSDERMAL | Status: AC
  Filled 2015-05-23: qty 1

## 2015-05-23 MED ORDER — MAGNESIUM HYDROXIDE 400 MG/5ML PO SUSP: 30.0000 mL | ORAL | Status: DC | PRN

## 2015-05-23 MED ORDER — PROPOFOL 10 MG/ML IV BOLUS
INTRAVENOUS | Status: AC
  Filled 2015-05-23: qty 20

## 2015-05-23 MED ORDER — DIBUCAINE 1 % RE OINT: 1.0000 "application " | TOPICAL_OINTMENT | RECTAL | Status: DC | PRN

## 2015-05-23 MED ORDER — GLYCOPYRROLATE 0.2 MG/ML IJ SOLN
INTRAMUSCULAR | Status: AC
  Filled 2015-05-23: qty 4

## 2015-05-23 MED ORDER — FENTANYL CITRATE (PF) 100 MCG/2ML IJ SOLN
INTRAMUSCULAR | Status: DC | PRN
  Administered 2015-05-23 (×2): 250 ug via INTRAVENOUS

## 2015-05-23 MED ORDER — ONDANSETRON HCL 4 MG/2ML IJ SOLN
INTRAMUSCULAR | Status: AC
  Filled 2015-05-23: qty 4

## 2015-05-23 MED ORDER — FENTANYL CITRATE (PF) 250 MCG/5ML IJ SOLN
INTRAMUSCULAR | Status: AC
  Filled 2015-05-23: qty 25

## 2015-05-23 MED ORDER — CEFAZOLIN SODIUM-DEXTROSE 2-3 GM-% IV SOLR
INTRAVENOUS | Status: AC
  Filled 2015-05-23: qty 50

## 2015-05-23 MED ORDER — ONDANSETRON HCL 4 MG/2ML IJ SOLN: 4.0000 mg | Freq: Four times a day (QID) | INTRAMUSCULAR | Status: DC | PRN

## 2015-05-23 MED ORDER — SIMETHICONE 80 MG PO CHEW
80.0000 mg | CHEWABLE_TABLET | ORAL | Status: DC | PRN
  Administered 2015-05-23 – 2015-05-25 (×4): 80 mg via ORAL
  Filled 2015-05-23 (×5): qty 1

## 2015-05-23 MED ORDER — SCOPOLAMINE 1 MG/3DAYS TD PT72
MEDICATED_PATCH | TRANSDERMAL | Status: DC | PRN
  Administered 2015-05-23: 1 via TRANSDERMAL

## 2015-05-23 MED ORDER — ONDANSETRON HCL 4 MG/2ML IJ SOLN
INTRAMUSCULAR | Status: AC
  Filled 2015-05-23: qty 2

## 2015-05-23 MED ORDER — HYDROMORPHONE 0.3 MG/ML IV SOLN
INTRAVENOUS | Status: DC
  Administered 2015-05-23: 2.39 mg via INTRAVENOUS
  Administered 2015-05-23: 0.99 mg via INTRAVENOUS
  Administered 2015-05-23: 07:00:00 via INTRAVENOUS
  Filled 2015-05-23: qty 25

## 2015-05-23 MED ORDER — PROPOFOL 10 MG/ML IV BOLUS
INTRAVENOUS | Status: AC
  Filled 2015-05-23: qty 40

## 2015-05-23 MED ORDER — DEXAMETHASONE SODIUM PHOSPHATE 10 MG/ML IJ SOLN
INTRAMUSCULAR | Status: AC
  Filled 2015-05-23: qty 1

## 2015-05-23 MED ORDER — ALBUTEROL SULFATE HFA 108 (90 BASE) MCG/ACT IN AERS
INHALATION_SPRAY | RESPIRATORY_TRACT | Status: DC | PRN
  Administered 2015-05-23: 3 via RESPIRATORY_TRACT

## 2015-05-23 MED ORDER — MIDAZOLAM HCL 2 MG/2ML IJ SOLN
INTRAMUSCULAR | Status: AC
  Filled 2015-05-23: qty 4

## 2015-05-23 MED ORDER — OXYTOCIN 10 UNIT/ML IJ SOLN
40.0000 [IU] | INTRAVENOUS | Status: DC | PRN
  Administered 2015-05-23: 40 [IU] via INTRAVENOUS

## 2015-05-23 MED ORDER — WITCH HAZEL-GLYCERIN EX PADS: 1.0000 "application " | MEDICATED_PAD | CUTANEOUS | Status: DC | PRN

## 2015-05-23 MED ORDER — OXYCODONE-ACETAMINOPHEN 5-325 MG PO TABS
1.0000 | ORAL_TABLET | ORAL | Status: DC | PRN
  Administered 2015-05-24: 1 via ORAL
  Administered 2015-05-24: 2 via ORAL
  Administered 2015-05-24 (×3): 1 via ORAL
  Administered 2015-05-24: 2 via ORAL
  Administered 2015-05-25 (×2): 1 via ORAL
  Administered 2015-05-25: 2 via ORAL
  Administered 2015-05-26: 1 via ORAL
  Administered 2015-05-26: 2 via ORAL
  Administered 2015-05-26 – 2015-05-27 (×3): 1 via ORAL
  Filled 2015-05-23: qty 2
  Filled 2015-05-23: qty 1
  Filled 2015-05-23: qty 2
  Filled 2015-05-23 (×4): qty 1
  Filled 2015-05-23 (×2): qty 2
  Filled 2015-05-23 (×5): qty 1

## 2015-05-23 MED ORDER — LACTATED RINGERS IV SOLN
INTRAVENOUS | Status: DC | PRN
  Administered 2015-05-23: 03:00:00 via INTRAVENOUS

## 2015-05-23 MED ORDER — ACETAMINOPHEN 325 MG PO TABS: 650.0000 mg | ORAL_TABLET | ORAL | Status: DC | PRN

## 2015-05-23 MED ORDER — PROMETHAZINE HCL 25 MG/ML IJ SOLN: 6.2500 mg | INTRAMUSCULAR | Status: DC | PRN

## 2015-05-23 MED ORDER — ONDANSETRON HCL 4 MG/2ML IJ SOLN
INTRAMUSCULAR | Status: DC | PRN
Start: 2015-05-23 — End: 2015-05-23
  Administered 2015-05-23: 4 mg via INTRAVENOUS

## 2015-05-23 MED ORDER — FENTANYL CITRATE (PF) 100 MCG/2ML IJ SOLN
25.0000 ug | INTRAMUSCULAR | Status: DC | PRN
  Administered 2015-05-23 (×2): 25 ug via INTRAVENOUS
  Administered 2015-05-23: 50 ug via INTRAVENOUS

## 2015-05-23 MED ORDER — FENTANYL CITRATE (PF) 100 MCG/2ML IJ SOLN
INTRAMUSCULAR | Status: AC
  Administered 2015-05-23: 25 ug via INTRAVENOUS
  Filled 2015-05-23: qty 2

## 2015-05-23 MED ORDER — IBUPROFEN 600 MG PO TABS
600.0000 mg | ORAL_TABLET | Freq: Four times a day (QID) | ORAL | Status: DC
  Administered 2015-05-23 – 2015-05-27 (×16): 600 mg via ORAL
  Filled 2015-05-23 (×17): qty 1

## 2015-05-23 MED ORDER — SODIUM CHLORIDE 0.9 % IR SOLN
Status: DC | PRN
  Administered 2015-05-23: 1000 mL

## 2015-05-23 MED ORDER — OXYTOCIN 40 UNITS IN LACTATED RINGERS INFUSION - SIMPLE MED: 62.5000 mL/h | INTRAVENOUS | Status: AC

## 2015-05-23 MED ORDER — SUCCINYLCHOLINE CHLORIDE 200 MG/10ML IV SOSY
PREFILLED_SYRINGE | INTRAVENOUS | Status: DC | PRN
  Administered 2015-05-23: 120 mg via INTRAVENOUS

## 2015-05-23 MED ORDER — PROPOFOL 10 MG/ML IV BOLUS
INTRAVENOUS | Status: DC | PRN
  Administered 2015-05-23: 50 mg via INTRAVENOUS
  Administered 2015-05-23: 150 mg via INTRAVENOUS
  Administered 2015-05-23: 100 mg via INTRAVENOUS

## 2015-05-23 MED ORDER — ACETAMINOPHEN 10 MG/ML IV SOLN
1000.0000 mg | Freq: Once | INTRAVENOUS | Status: AC
  Administered 2015-05-23: 1000 mg via INTRAVENOUS
  Filled 2015-05-23: qty 100

## 2015-05-23 MED ORDER — LIDOCAINE HCL (CARDIAC) 20 MG/ML IV SOLN
INTRAVENOUS | Status: AC
  Filled 2015-05-23: qty 5

## 2015-05-23 MED ORDER — LACTATED RINGERS IV SOLN
INTRAVENOUS | Status: DC
  Administered 2015-05-23: 10:00:00 via INTRAVENOUS

## 2015-05-23 MED ORDER — PRENATAL MULTIVITAMIN CH
1.0000 | ORAL_TABLET | Freq: Every day | ORAL | Status: DC
  Administered 2015-05-23 – 2015-05-26 (×4): 1 via ORAL
  Filled 2015-05-23 (×4): qty 1

## 2015-05-23 MED ORDER — MIDAZOLAM HCL 5 MG/5ML IJ SOLN
INTRAMUSCULAR | Status: DC | PRN
  Administered 2015-05-23: 2 mg via INTRAVENOUS

## 2015-05-23 MED ORDER — MEASLES, MUMPS & RUBELLA VAC ~~LOC~~ INJ
0.5000 mL | INJECTION | Freq: Once | SUBCUTANEOUS | Status: DC
  Filled 2015-05-23: qty 0.5

## 2015-05-23 MED ORDER — MENTHOL 3 MG MT LOZG: 1.0000 | LOZENGE | OROMUCOSAL | Status: DC | PRN

## 2015-05-23 MED ORDER — SENNOSIDES-DOCUSATE SODIUM 8.6-50 MG PO TABS
2.0000 | ORAL_TABLET | ORAL | Status: DC
Start: 2015-05-24 — End: 2015-05-27
  Administered 2015-05-23 – 2015-05-26 (×3): 2 via ORAL
  Filled 2015-05-23 (×4): qty 2

## 2015-05-23 MED ORDER — OXYTOCIN 10 UNIT/ML IJ SOLN
INTRAMUSCULAR | Status: AC
  Filled 2015-05-23: qty 4

## 2015-05-23 MED ORDER — ZOLPIDEM TARTRATE 5 MG PO TABS: 5.0000 mg | ORAL_TABLET | Freq: Every evening | ORAL | Status: DC | PRN

## 2015-05-23 MED ORDER — NEOSTIGMINE METHYLSULFATE 10 MG/10ML IV SOLN
INTRAVENOUS | Status: AC
  Filled 2015-05-23: qty 1

## 2015-05-23 MED ORDER — LACTATED RINGERS IV SOLN
INTRAVENOUS | Status: DC | PRN
  Administered 2015-05-23 (×3): via INTRAVENOUS

## 2015-05-23 MED ORDER — NALOXONE HCL 0.4 MG/ML IJ SOLN: 0.4000 mg | INTRAMUSCULAR | Status: DC | PRN

## 2015-05-23 MED ORDER — CEFAZOLIN SODIUM-DEXTROSE 2-3 GM-% IV SOLR
INTRAVENOUS | Status: DC | PRN
  Administered 2015-05-23: 2 g via INTRAVENOUS

## 2015-05-23 MED ORDER — LANOLIN HYDROUS EX OINT: 1.0000 "application " | TOPICAL_OINTMENT | CUTANEOUS | Status: DC | PRN

## 2015-05-23 SURGICAL SUPPLY — 34 items
CLAMP CORD UMBIL (MISCELLANEOUS) IMPLANT
CLOTH BEACON ORANGE TIMEOUT ST (SAFETY) ×3 IMPLANT
CONTAINER PREFILL 10% NBF 15ML (MISCELLANEOUS) IMPLANT
DRAPE SHEET LG 3/4 BI-LAMINATE (DRAPES) IMPLANT
DRSG OPSITE POSTOP 4X10 (GAUZE/BANDAGES/DRESSINGS) ×3 IMPLANT
DURAPREP 26ML APPLICATOR (WOUND CARE) ×3 IMPLANT
ELECT REM PT RETURN 9FT ADLT (ELECTROSURGICAL) ×3
ELECTRODE REM PT RTRN 9FT ADLT (ELECTROSURGICAL) ×1 IMPLANT
EXTRACTOR VACUUM KIWI (MISCELLANEOUS) IMPLANT
EXTRACTOR VACUUM M CUP 4 TUBE (SUCTIONS) IMPLANT
EXTRACTOR VACUUM M CUP 4' TUBE (SUCTIONS)
GLOVE ORTHO TXT STRL SZ7.5 (GLOVE) ×3 IMPLANT
GOWN STRL REUS W/TWL LRG LVL3 (GOWN DISPOSABLE) ×6 IMPLANT
KIT ABG SYR 3ML LUER SLIP (SYRINGE) IMPLANT
NDL HYPO 25X5/8 SAFETYGLIDE (NEEDLE) ×1 IMPLANT
NEEDLE HYPO 25X5/8 SAFETYGLIDE (NEEDLE) ×3 IMPLANT
NS IRRIG 1000ML POUR BTL (IV SOLUTION) ×3 IMPLANT
PACK C SECTION WH (CUSTOM PROCEDURE TRAY) ×3 IMPLANT
PAD OB MATERNITY 4.3X12.25 (PERSONAL CARE ITEMS) ×3 IMPLANT
PENCIL SMOKE EVAC W/HOLSTER (ELECTROSURGICAL) ×3 IMPLANT
RTRCTR C-SECT PINK 25CM LRG (MISCELLANEOUS) ×3 IMPLANT
SUT CHROMIC 1 CTX 36 (SUTURE) ×6 IMPLANT
SUT MNCRL 0 VIOLET CTX 36 (SUTURE) IMPLANT
SUT MONOCRYL 0 CTX 36 (SUTURE) ×6
SUT PLAIN 0 NONE (SUTURE) IMPLANT
SUT PLAIN 2 0 XLH (SUTURE) ×4 IMPLANT
SUT VIC AB 0 CT1 27 (SUTURE) ×6
SUT VIC AB 0 CT1 27XBRD ANBCTR (SUTURE) ×2 IMPLANT
SUT VIC AB 2-0 CT1 (SUTURE) ×3 IMPLANT
SUT VIC AB 2-0 CT1 27 (SUTURE) ×6
SUT VIC AB 2-0 CT1 TAPERPNT 27 (SUTURE) ×1 IMPLANT
SUT VIC AB 4-0 KS 27 (SUTURE) ×2 IMPLANT
TOWEL OR 17X24 6PK STRL BLUE (TOWEL DISPOSABLE) ×3 IMPLANT
TRAY FOLEY CATH SILVER 14FR (SET/KITS/TRAYS/PACK) ×3 IMPLANT

## 2015-05-23 NOTE — MAU Note (Signed)
Manus Gunning, CNM at bedside with bedside ultrasound. 10L O2 NRB applied. Dr. Glo Herring, [facility] Coverage, and Dr. Willis Modena notified. IV started and bolused. Stat Cesarean called. Patient taken directly to OR via stretcher.

## 2015-05-23 NOTE — Addendum Note (Signed)
Addendum  created 05/23/15 0750 by Talbot Grumbling, CRNA   Modules edited: Notes Section   Notes Section:  File: [id number]

## 2015-05-23 NOTE — Plan of Care (Signed)
Problem: Phase I Progression Outcomes Goal: Initial discharge plan identified Outcome: Completed/Met Date Met:  05/23/15 VSS Pain controlled Bleeding WNL Understands self care Understands F/U care

## 2015-05-23 NOTE — Op Note (Signed)
See Dr. Johnnye Sima Op Note for the first part of this case  Pre-op Diagnosis:  IUP at 37+ weeks, fetal bradycardia, previous c-section x 2 Post-op diagnosis:  Same with uterine rupture Procedure:  Stat cesarean section Surgeons:  Mallory Shirk, MD, Cheri Fowler, MD Anesthesia:  General Findings:  See Dr. Johnnye Sima op note for initial findings, infant with Apgars of 1/3/3 EBL:  800cc  Procedure in detail:    When I entered the operative field, the baby had been delivered, the placenta had been  removed from the field, and the uterus had been wiped dry. The uterine rupture site was inspected and found to be a clean transverse rupture with no extensions. A small amount of what appeared to be nonviable tissue was removed from the superior portion of this incision on the patient's left side. The site was closed with running locking 0 Monocryl followed by an imbricating layer also with 0 Monocryl. Bleeding from the right angle was controlled with a figure-of-eight suture of Monocryl. This achieved adequate closure and adequate hemostasis. Tubes and ovaries were inspected and found to be normal. The upper abdomen was irrigated. Uterine incision was inspected and found to be hemostatic. The previously placed Alexis retractor was removed. Peritoneum was then closed with running 2-0 Vicryl. Subfascial space was irrigated and made hemostatic with the Bovie. Fascia was closed in a running fashion starting at both ends and meeting in the middle with 0 Vicryl. Subcutaneous tissue was then irrigated and made hemostatic with Bovie. Subcutaneous tissue was closed with interrupted sutures of 2-0 plain gut. Skin edges were freed with Bovie and skin edges were then reapproximated with staples followed by a sterile dressing. Patient was awakened in the operating room and taken to the recovery room in stable condition. Counts were correct.

## 2015-05-23 NOTE — Anesthesia Postprocedure Evaluation (Signed)
  Anesthesia Post-op Note  Patient: Christina [name]  Procedure(s) Performed: Procedure(s): CESAREAN SECTION (N/A)  Patient Location: PACU  Anesthesia Type:General  Level of Consciousness: awake, alert  and oriented  Airway and Oxygen Therapy: Patient Spontanous Breathing  Post-op Pain: mild  Post-op Assessment: Post-op Vital signs reviewed, Patient's Cardiovascular Status Stable, Respiratory Function Stable, Patent Airway, No signs of Nausea or vomiting, Pain level controlled and No headache              Post-op Vital Signs: Reviewed and stable  Last Vitals:  Filed Vitals:   05/23/15 0530  BP: 134/67  Pulse: 53  Temp: 36.7 C  Resp: 16    Complications: No apparent anesthesia complications

## 2015-05-23 NOTE — Anesthesia Procedure Notes (Addendum)
Procedure Name: Intubation Performed by: Ignacia Bayley Pre-anesthesia Checklist: Patient identified, Patient being monitored, Emergency Drugs available, Suction available and Timeout performed Patient Re-evaluated:Patient Re-evaluated prior to inductionOxygen Delivery Method: Circle system utilized Preoxygenation: Pre-oxygenation with 100% oxygen Intubation Type: IV induction, Rapid sequence and Cricoid Pressure applied Laryngoscope Size: Glidescope and 3 Grade View: Grade II Tube type: Oral Tube size: 7.0 mm Number of attempts: 1 Airway Equipment and Method: Bite block Placement Confirmation: breath sounds checked- equal and bilateral and positive ETCO2 Secured at: 20 cm Tube secured with: Tape Dental Injury: Teeth and Oropharynx as per pre-operative assessment  Comments: Intubation performed by Dr. Hoy Morn.

## 2015-05-23 NOTE — Anesthesia Postprocedure Evaluation (Signed)
Anesthesia Post Note  Patient: Christina [name]  Procedure(s) Performed: Procedure(s) (LRB): CESAREAN SECTION (N/A)  Anesthesia type: General  Patient location: Mother/Baby  Post pain: Pain level controlled  Post assessment: Post-op Vital signs reviewed  Last Vitals:  Filed Vitals:   05/23/15 0700  BP: 134/75  Pulse: 51  Temp:   Resp: 18    Post vital signs: Reviewed  Level of consciousness: awake and alert   Complications: No apparent anesthesia complications

## 2015-05-23 NOTE — Progress Notes (Signed)
Subjective: Postpartum Day #0: Cesarean Delivery-stat for uterine rupture Patient reports incisional pain and tolerating PO.    Objective: Vital signs in last 24 hours: Temp:  [97.5 F (36.4 C)-98.4 F (36.9 C)] 98.4 F (36.9 C) (09/05 0619) Pulse Rate:  [50-88] 51 (09/05 0700) Resp:  [11-23] 18 (09/05 0700) BP: (134-157)/(67-88) 134/75 mmHg (09/05 0700) SpO2:  [96 %-100 %] 98 % (09/05 0700) Weight:  [104.327 kg (230 lb)] 104.327 kg (230 lb) (09/05 0009)  Physical Exam:  General: alert Lochia: appropriate Uterine Fundus: firm Incision: dressing C/D/I   Recent Labs  05/23/15 0435  HGB 10.7*  HCT 32.1*    Assessment/Plan: Status post Cesarean section. Doing well postoperatively.  Continue current care, ambulate.  Baby in NICU.  Tayllor Breitenstein D 05/23/2015, 10:25 AM

## 2015-05-23 NOTE — Anesthesia Preprocedure Evaluation (Addendum)
Anesthesia Evaluation  Patient identified by MRN, date of birth, ID band Patient awake    Reviewed: Allergy & Precautions, NPO status , Patient's Chart, lab work & pertinent test resultsPreop documentation limited or incomplete due to emergent nature of procedure.  History of Anesthesia Complications (+) PONV and history of anesthetic complications  Airway Mallampati: III  TM Distance: >3 FB Neck ROM: Full    Dental  (+) Teeth Intact, Dental Advisory Given   Pulmonary Current Smoker,  breath sounds clear to auscultation  Pulmonary exam normal       Cardiovascular hypertension, Pt. on medications Normal cardiovascular examRhythm:Regular Rate:Tachycardia     Neuro/Psych Seizures -,  PSYCHIATRIC DISORDERS Anxiety Depression    GI/Hepatic negative GI ROS, Neg liver ROS,   Endo/Other  negative endocrine ROSdiabetes, GestationalMorbid obesity  Renal/GU negative Renal ROS     Musculoskeletal negative musculoskeletal ROS (+)   Abdominal   Peds  Hematology  (+) Blood dyscrasia, anemia ,   Anesthesia Other Findings Day of surgery medications reviewed with the patient.  Reproductive/Obstetrics (+) Pregnancy                           Anesthesia Physical Anesthesia Plan  ASA: III and emergent  Anesthesia Plan: General   Post-op Pain Management:    Induction: Intravenous, Rapid sequence and Cricoid pressure planned  Airway Management Planned: Oral ETT  Additional Equipment:   Intra-op Plan:   Post-operative Plan: Extubation in OR  Informed Consent: I have reviewed the patients History and Physical, chart, labs and discussed the procedure including the risks, benefits and alternatives for the proposed anesthesia with the patient or authorized representative who has indicated his/her understanding and acceptance.   Dental advisory given  Plan Discussed with: CRNA  Anesthesia Plan Comments:  (Risks/benefits of general anesthesia discussed with patient including risk of damage to teeth, lips, gum, and tongue, nausea/vomiting, allergic reactions to medications, and the possibility of heart attack, stroke and death.  All patient questions answered.  Patient wishes to proceed.  Stat C-section called at 42 in patient from MAU.  Met patient in hallway in transit to OR.  Brief review of systems, focused physical exam conducted en route to OR.  Last ate at 2200 on 05/22/2015. Plan for rapid sequence intubation.)        Anesthesia Quick Evaluation

## 2015-05-23 NOTE — MAU Note (Signed)
Staff notified by security that patient was crying and yelling outside. Patient refusing to walk or get in a wheelchair; patient put in a steddy and transferred directly to MAU room. She states she felt something pop in her mid abdomen and the pain got worse when she was on her way home.

## 2015-05-23 NOTE — Consult Note (Addendum)
  Consult note: I received a call at 2:54 AM from [place],, CNM, requesting my assistance in the care of this patient who had presented with abdominal pain and fetal bradycardia, reported to have a heart rate in the 70s. During travel to the MAU I notified the OR that a stat C-section was possible, and upon my arrival to the MAU, the patient was noted to be alert, right lateral decubitus and IV access was being obtained. Fetal heart rate was noted on the brief monitoring strip strip to be in the 70s with minimal beat-to-beat variability. Verbal consent was obtained from the patient for cesarean section, and IV access completed by the nursing team. Decision for stat C-section made and preparation for moving the patient to the operating room completed. I left the room at 2:59 AM, anesthesia arrived in the room a few seconds later, the patient was promptly transported to operating room 9, or patient rapidly transferred to the OR table, Foley catheter inserted abdomen quickly prepped with Betadine,, drape placed, brief rapid timeout conducted and anesthesia induced. Promptly upon intubation, rapid cesarean section was performed through transverse lower abdominal incision, with sharp dissection fascia which was opened with a knife down to the fascia, and then through the fascia, fascia was quickly released from the muscle with combination of sharp dissection and manual traction and peritoneal cavity entered revealing gross blood clinic. Incision extended inferiorly, and abdomen explored. The placenta was completely out of the uterus and was delivered first and then the cord followed to the baby which was in the upper abdomen, legs identified and grasped in the upper abdomen, and the baby quickly extracted. The cord was clamped at its midpoint quickly and the baby transferred to the pediatricians for subsequent care. Delivery was at 3:09 AM uterus was quickly exteriorized and found to be hemostatic. The  entire old C-section scar had opened up, and had the appearance of the very thin scar line. There was no significant bleeding Alexis wound retractor was positioned and abdomen irrigated and suctioned out and the uterus irrigated and cleansed, then confirmed as hemostatic and without membranes remnants.. Bladder was confirmed as being intact visually. At this time Dr. Willis Modena arrived, scrubbed in and continued with the surgical procedure, and I assisted. Please refer to his continued operative details in his operative note.

## 2015-05-23 NOTE — Transfer of Care (Signed)
Immediate Anesthesia Transfer of Care Note  Patient: Christina [name]  Procedure(s) Performed: Procedure(s): CESAREAN SECTION (N/A)  Patient Location: PACU  Anesthesia Type:General  Level of Consciousness: sedated  Airway & Oxygen Therapy: Patient Spontanous Breathing and Patient connected to face mask oxygen  Post-op Assessment: Report given to RN and Post -op Vital signs reviewed and stable  Post vital signs: stable  Last Vitals:  Filed Vitals:   05/23/15 0430  BP: 140/72  Pulse: 72  Temp:   Resp: 23    Complications: No apparent anesthesia complications

## 2015-05-23 NOTE — H&P (Signed)
Christina [name] is a 39 y.o. female, G4 P2, EGA 37+ weeks with EDC 9-20 presenting for sudden increase in abdominal pain.  The patient had a friend call me late last evening saying she was having ctx 3 minutes apart and was heading to the hospital.  She came to the hospital by EMS and was evaluated in MAU, found to have irregular ctx, cervix was not significantly dilated, FHT reactive.  She was discharged home.  Apparently on the way home she had a sudden increase in abdominal pain.  She came back to the hospital and upon repeat evaluation in MAU was found to have [place] in the 60-70s.  I was called and headed to the hospital while Dr. Glo Herring from [facility] was called, evaluated the patient quickly and took her back for a stat c-section.  Prenatal care complicated only by 2 previous c-sections for which she was scheduled for a repeat c-section at 39 weeks.    Maternal Medical History:  Reason for admission: Contractions.     OB History    Gravida Para Term Preterm AB TAB SAB Ectopic Multiple Living   4 3 3  1  1   0 3     Past Medical History  Diagnosis Date  . Anxiety   . Depression   . Seizures 2001    stressed induced  . Hypertension     on meds until wt loss  . Gestational diabetes mellitus, antepartum 2015    managed with diet  . PONV (postoperative nausea and vomiting)    Past Surgical History  Procedure Laterality Date  . Appendectomy    . Cesarean section    . Cesarean section N/A 07/14/2014    Procedure: CESAREAN SECTION;  Surgeon: Cheri Fowler, MD;  Location: [facility];  Service: Obstetrics;  Laterality: N/A;   Family History: family history is not on file. Social History:  reports that she has been smoking Cigarettes.  She has been smoking about 0.25 packs per day. She has never used smokeless tobacco. She reports that she drinks alcohol. She reports that she uses illicit drugs (Marijuana).   Prenatal Transfer Tool  Maternal Diabetes: No Genetic Screening:  Normal Maternal Ultrasounds/Referrals: Normal Fetal Ultrasounds or other Referrals:  None Maternal Substance Abuse:  No Significant Maternal Medications:  None Significant Maternal Lab Results:  Lab values include: Group B Strep negative Other Comments:  Stat c-section for fetal bradycardia from uterine rupture  Review of Systems  Respiratory: Negative.   Cardiovascular: Negative.     Dilation: 1 unknown if currently breastfeeding. Maternal Exam:  Abdomen: Surgical scars: low transverse.     Physical Exam  Constitutional: She appears well-developed and well-nourished.  Cardiovascular: Normal rate, regular rhythm and normal heart sounds.   No murmur heard. Respiratory: Effort normal and breath sounds normal. No respiratory distress. She has no wheezes.    Prenatal labs: ABO, Rh: --/--/A POS, A POS (10/26 1130) Antibody: NEG (10/26 1130) Rubella:  Immune RPR: NON REAC (10/26 1130)  HBsAg:   Neg HIV:   NR GBS:   Neg GCT:  91  Assessment/Plan: IUP at 37+ weeks with abdominal pain and fetal bradycardia, previous c-section x 2, probable uterine rupture.  See op note from stat c-section.   Tyrene Nader D 05/23/2015, 4:02 AM

## 2015-05-23 NOTE — Lactation Note (Signed)
This note was copied from the chart of [place]. Lactation Consultation Note  Patient Name: Christina [name] [id number] Date: 05/23/2015 Reason for consult: Initial assessment;NICU baby;Other (Comment) (27 6/[redacted] weeks gestation baby, placental abruptionand uterine rupture, code apgar, first apgar 1, baby on coling blanket)      Mom is an experienced breast feeder, and has pumped recently with her 41 year old. She was able to pump and hand express 2-3 mls of colostrum. Mom said she had an abundant supply in the past. I reviewed the NICU booklet with her, and faxed her information  to [facility].    Maternal Data Formula Feeding for Exclusion: Yes (baby in NICU) Has patient been taught Hand Expression?: Yes Does the patient have breastfeeding experience prior to this delivery?: Yes  Feeding    LATCH Score/Interventions                      Lactation Tools Discussed/Used WIC Program: [place] ([place] Pump Review: Setup, frequency, and cleaning;Milk Storage;Other (comment) (hand expression, premie setting, review of nicu boklet) Initiated by:: bedside RN Date initiated:: 05/23/15   Consult Status Consult Status: Follow-up Date: 05/24/15 Follow-up type: In-patient    Tonna Corner 05/23/2015, 11:01 AM

## 2015-05-24 ENCOUNTER — Encounter (HOSPITAL_COMMUNITY): Payer: Self-pay | Admitting: Obstetrics and Gynecology

## 2015-05-24 LAB — RPR: RPR Ser Ql: NONREACTIVE

## 2015-05-24 NOTE — Progress Notes (Signed)
Subjective: Postpartum Day #1: Cesarean Delivery Patient reports incisional pain, tolerating PO and no problems voiding.  Baby stable in NICU, probably having seizures.  Objective: Vital signs in last 24 hours: Temp:  [97.7 F (36.5 C)-98.5 F (36.9 C)] 97.7 F (36.5 C) (09/06 0555) Pulse Rate:  [53-74] 66 (09/06 0555) Resp:  [16-21] 18 (09/06 0555) BP: (101-143)/(49-69) 143/62 mmHg (09/06 0555) SpO2:  [97 %-100 %] 100 % (09/06 0555) Weight:  [104.327 kg (230 lb)] 104.327 kg (230 lb) (09/05 1032)  Physical Exam:  General: alert Lochia: appropriate Uterine Fundus: firm Incision: dressing C/D/I   Recent Labs  05/23/15 0435  HGB 10.7*  HCT 32.1*    Assessment/Plan: Status post Cesarean section. Doing well postoperatively.  Continue current care, ambulate.  Jamylah Marinaccio D 05/24/2015, 8:13 AM

## 2015-05-24 NOTE — Lactation Note (Signed)
This note was copied from the chart of [place]. Lactation Consultation Note  Follow up visit made.  Mom states pumping is going well and breasts are feeling fuller.  Instructed to call out for concerns/assit prn.  Patient Name: Christina [name] [id number] Date: 05/24/2015     Maternal Data    Feeding    LATCH Score/Interventions                      Lactation Tools Discussed/Used     Consult Status      Ave Filter 05/24/2015, 12:31 PM

## 2015-05-25 NOTE — Clinical Social Work Maternal (Signed)
CLINICAL SOCIAL WORK MATERNAL/CHILD NOTE  Patient Details  Name: Christina [name] MRN: [id number] Date of Birth: [birth year]  Date:  05/25/2015  Clinical Social Worker Initiating Note:  Kilani Joffe E. Brigitte Pulse, [place] Date/ Time Initiated:  05/25/15/0945     Child's Name:  Christina [name]   Legal Guardian:   (Parents: Christina [name] and Christina [name])   Need for Interpreter:  None   Date of Referral:  05/24/15     Reason for Referral:  Parental Support of Premature Babies < 32 weeks/of Critically Ill babies    Referral Source:  RN   Address:  [address] [place], [place] [address]  Phone number:  [phone]   Household Members:  Minor Children, Spouse (MOB has a 29 year old son Christina [name]) from a previous marriage, and a 17 month old son Christina [name]) with current husband/FOB.)   Natural Supports (not living in the home):  Immediate Family, Extended Family, Friends   Chiropodist:  (MOB reports having a Transport planner at [place] approximately 1.5 years ago, but has not been in therapy since that clinician left the office.)   Employment:     Type of Work:  (Per MOB, FOB works in the Banker at [employer])   Education:      Museum/gallery curator Resources:  [employer]   Other Resources:      Cultural/Religious Considerations Which May Impact Care: None stated  Strengths:  Ability to meet basic needs , Pediatrician chosen , Understanding of illness, Compliance with medical plan , Home prepared for child  (Pediatric follow up will be at [facility].  MOB reports having a pack and play for baby and some clothes, but states she could benefit from some additional resources.)   Risk Factors/Current Problems:  Mental Health Concerns , Substance Use  (PTSD, hx of PPD and documented marijuana use)   Cognitive State:  Alert , Goal Oriented , Insightful    Mood/Affect:  Tearful , Calm , Comfortable , Happy    CSW Assessment: CSW met with MOB in her third  floor room/320 to introduce services, offer support, and complete assessment due to NICU admission at 37.6 weeks.  MOB welcomed CSW's visit and presents as pleasant, happy, and eager to talk.  She reports feeling more sore now than after her first two c-sections, but overall doing well.  CSW invited her to share her labor and delivery story as a way to assist her in processing this event.  MOB spoke in great detail about her labor, visits to [facility]U, and emergency c-section due to uterine rupture.  MOB states she is thankful that she and baby are "still here," and talks about how she and baby "could have died" if the course of events had been different.  MOB reports numerous support people involved in her family and the roles they played during her labor and delivery.  MOB states that her husband, older sister in [place], ex-husband and his girlfriend Christina [name] and Christina [name]), and her sons' godfathers (Christina [name] and Christina [name]) are her main support people.  She reports feeling well cared for during her postpartum stay in the hospital and states appreciation to the staff in NICU, who have "been great."  She initially felt that information regarding baby's condition was only being shared with her husband and not her, but did not necessarily state this as a concern.  She states understanding that Christina [name] is having seizures and the reason he is on the cooling blanket and states she "hopes  there will be no brain damage."  MOB aware that she will discharge prior to [place] and states she has three friends in [place] who have offered to let her stay if she needs to be close to the hospital.    MOB shared about her diagnosis of PTSD and states this is due to "childhood trauma."  She reports a very strained relationship with her mother.  She also states that she has a "stress-induced seizure disorder," but that she is able to redirect her thoughts and remove herself from stressful situations in order to control this.  She states she has not had a Grand  Mal seizure in 15 years.  MOB disclosed that she "OD'd on acid or something" when she was 39 years old because her siblings or mother/mother's boyfriend had it in the home.  She states that was the beginning of her seizures.  She shared that she sought counseling at [facility] during her last pregnancy due to a situation with her husband and feels she benefited greatly from this.  She states he "got his stuff worked out" also and reports a great relationship at this time.  She feels, however, that much of her family alienated her at this time.  She reports a stop in counseling about 2 months before her last son was born because the therapist "left the country for a family emergency."  CSW discussed PTSD and the possibility that her traumatic delivery and baby's admission to NICU can trigger symptoms.  CSW discussed the benefits of processing her feelings and considering a counselor at this time or in the near future.  MOB reports a willingness to return to counseling if needed, but feels she has done well with the coping strategies learned in past counseling.  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  She states she realized "late" that she was having PPD and didn't seek help for approximately a year after the birth.  She states she took Citalopram from that time until she and her husband divorced 2 years ago.  She is also open to resuming medication if she feels it would help her, but does not think it is needed at this time.  She is aware of signs and symptoms of PPD to watch for and states a commitment to speaking with CSW and or MD if she has concerns about her mental/emotional health at any time.  MOB spoke at length about her 41 year old's diagnosis with Autism as well as her relationship with his father.  She reports they are "best friends" ever since the divorce and she feels they do a great  job co-parenting.    MOB reports having a pack and play for baby at home and reports that her 32 month old also sleeps in a pack and play.  She states her 71 month old slept on a pillow next to her on the couch for the first month and reports that he has "done well sleeping in his own bed for the past 3 months."  CSW attempted to provided education on safe sleep/SIDS precautions, but MOB seemed distracted at this point and focused on the nurse who came in to evaluate her.  CSW feels MOB could benefit from a more in depth conversation regarding safe sleep.  She states she has a few outfits for baby at home, but has not  gotten any diapers.  She seemed stressed about her lack of preparation.  CSW offered baby basics from [employer] and MOB was Education administrator.  CSW will make referral to FSN.  CSW also informed MOB of gas vouchers if she plans to be going back and forth to her home (not exclusively staying with friends in [place]) during baby's hospitalization.  MOB states she will definitely need to go back and forth and accepted the vouchers with appreciation.  CSW does not have any vouchers at this time and explained to MOB that CSW will provide $20 every two weeks, as needed, as soon as more are obtained.  MOB stated understanding and again thanked CSW.    CSW explained ongoing support services offered by NICU CSW and gave contact information, encouraging MOB to call any time.  CSW spent over an hour with MOB and as CSW was leaving, MOB began to cry due to the fact that she has not yet gotten to hold baby since he is on hypothermia protocol.  CSW provided support and validation of her feelings.  CSW did not have the opportunity to discuss hx of marijuana use documented in MOB's H&P at this time, but plans to follow up.  Baby's UDS is negative.   CSW Plan/Description:  Engineer, mining , Psychosocial Support and Ongoing Assessment of Needs, Information/Referral to [place], [place], [place] 05/25/2015, 11:34 AM

## 2015-05-25 NOTE — Progress Notes (Signed)
POD #2 Doing ok Afeb, VSS Abd- soft, fundus firm, incision intact Continue routine care

## 2015-05-25 NOTE — Lactation Note (Signed)
This note was copied from the chart of [place]. Lactation Consultation Note  Follow up visit made.  Mom states she is pumping good amounts of milk now.  She is pleased.  She is now using standard setting.  Mom is planning on obtaining a pump from [facility].  Encouraged to call with questions/concerns prn.  Patient Name: Christina [name] [id number] Date: 05/25/2015     Maternal Data    Feeding    LATCH Score/Interventions                      Lactation Tools Discussed/Used     Consult Status      Ave Filter 05/25/2015, 9:41 AM

## 2015-05-26 NOTE — Addendum Note (Signed)
Addendum  created 05/26/15 0845 by Catalina Gravel, MD   Modules edited: Anesthesia Attestations

## 2015-05-26 NOTE — Lactation Note (Signed)
This note was copied from the chart of [place]. Lactation Consultation Note  Patient Name: Boy Hayslee Casebolt [id number] Date: 05/26/2015 Reason for consult: Follow-up assessment Baby 34 hours old. Mom reports that she is getting several ounces of EBM each time that she pumps. Mom states that she has been waiting closer to 4 hours to pump so that she "will get more" when she pumps. Discussed supply and demand and enc pumping 8 times/24 hours, sleeping for 5 continuous hours at night as able. Enc pumping if her breasts wake her up at night. Mom aware of OP/BFSG and [place] phone line assistance after D/C. Mom aware of pumping rooms in NICU. Mom will be picking up a DEBP from [facility] 05-27-15.  Maternal Data    Feeding    LATCH Score/Interventions                      Lactation Tools Discussed/Used     Consult Status Consult Status: PRN    Inocente Salles 05/26/2015, 3:17 PM

## 2015-05-26 NOTE — Progress Notes (Signed)
POD #3 No problems, baby stable in Advance awake off sedatives Afeb, VSS Abd- soft, fundus firm, incision intact Continue routine care, plan d/c home tomorrow

## 2015-05-27 MED ORDER — IBUPROFEN 600 MG PO TABS: 600.0000 mg | ORAL_TABLET | Freq: Four times a day (QID) | ORAL | Status: DC

## 2015-05-27 MED ORDER — OXYCODONE-ACETAMINOPHEN 5-325 MG PO TABS: 1.0000 | ORAL_TABLET | ORAL | Status: DC | PRN

## 2015-05-27 NOTE — Discharge Summary (Signed)
    OB Discharge Summary  Patient Name: Christina [name] DOB: Mar 29, 1976 MRN: [id number]  Date of admission: 05/23/2015 Delivering MD: Jonnie Kind   Date of discharge:05/27/2015  Admitting diagnosis: Fetal bradycardia, suspected ruptured uterus   Intrauterine pregnancy: [redacted]w[redacted]d     Secondary diagnosis: previous c-section x 2     Discharge diagnosis: Term Pregnancy Delivered and fetal bradycardia secondary to ruptured uterus                            Complications:Uterine Rupture  Hospital course:  Pt admitted with fetal bradycardia and abdominal pain, taken to OR for stat c-section.  She had no postpartum complications, baby went to NICU and was put on cooling protocol, had seizures.  Physical exam  Filed Vitals:   05/26/15 1200 05/26/15 1810 05/26/15 2144 05/27/15 0549  BP: 139/60 135/79 133/58 141/63  Pulse: 68 51 67 58  Temp: 98.6 F (37 C) 98.4 F (36.9 C) 98.7 F (37.1 C) 98.3 F (36.8 C)  TempSrc: Oral Oral Oral Oral  Resp: 18 18 18 18   Height:      Weight:      SpO2: 99% 99% 99% 98%   General: alert Lochia: appropriate Uterine Fundus: firm Incision: Healing well with no significant drainage  Labs: Lab Results  Component Value Date   WBC 15.5* 05/23/2015   HGB 10.7* 05/23/2015   HCT 32.1* 05/23/2015   MCV 79.3 05/23/2015   PLT 218 05/23/2015   CMP Latest Ref Rng 07/12/2014  Glucose 70 - 99 mg/dL 83  BUN 6 - 23 mg/dL 7  Creatinine 0.50 - 1.10 mg/dL 0.64  Sodium 137 - 147 mEq/L 137  Potassium 3.7 - 5.3 mEq/L 4.1  Chloride 96 - 112 mEq/L 102  CO2 19 - 32 mEq/L 24  Calcium 8.4 - 10.5 mg/dL 9.5    Discharge instruction: per After Visit Summary and "Baby and Me Booklet".  Medications:   Medication List    TAKE these medications        acetaminophen 500 MG tablet  Commonly known as:  TYLENOL  Take 500 mg by mouth as needed for mild pain.     ibuprofen 600 MG tablet  Commonly known as:  ADVIL,MOTRIN  Take 1 tablet (600 mg total) by mouth every 6  (six) hours.     oxyCODONE-acetaminophen 5-325 MG per tablet  Commonly known as:  PERCOCET/ROXICET  Take 1-2 tablets by mouth every 4 (four) hours as needed for severe pain.     prenatal multivitamin Tabs tablet  Take 1 tablet by mouth daily at 12 noon.        Diet: routine diet  Activity: Advance as tolerated. Pelvic rest for 6 weeks.   Outpatient follow up:3 days for staple removal   Newborn Data: Live born female  Birth Weight: 7 lb 0.2 oz (3180 g) APGAR: 1, 3, 3  Disposition:NICU   05/27/2015 Clarene Duke, MD

## 2015-05-27 NOTE — Discharge Instructions (Signed)
As per discharge pamphlet °

## 2015-05-27 NOTE — Progress Notes (Signed)
POD #4 Doing ok, upset baby will not come home with her Afeb, VSS Abd- soft, fundus firm, incision intact D/c home today

## 2015-05-30 ENCOUNTER — Ambulatory Visit: Payer: Self-pay

## 2015-05-30 NOTE — Lactation Note (Signed)
This note was copied from the chart of [name]. Lactation Consultation Note  Patient Name: Christina [name] [id number] Date: 05/30/2015 Reason for consult: Follow-up assessment   With this mom of a NICU baby, now 24 days old and term. Mom did not bring her pump parts to the NICU to pump, so I gave her a manual hand pump and instructed her in it's use. Mom knows to call for questions/concerns.    Maternal Data    Feeding Feeding Type: Breast Milk Nipple Type: Regular Length of feed: 30 min  LATCH Score/Interventions                      Lactation Tools Discussed/Used     Consult Status Consult Status: PRN Follow-up type: In-patient (NICU)    Tonna Corner 05/30/2015, 3:04 PM

## 2015-05-30 NOTE — Progress Notes (Signed)
Was referred to patient by RN.  Introduced myself and spiritual care services.  Invited pt to share and process her grief regarding her traumatic birth.  She shared that she had tried to come in earlier and felt she was dismissed by the nurse.  She was thankful that they decided not to go home to [place] because her uterus subsequently ruptured a short while after she was discharged.  She shared that she believes her son's current medical state is the result of that nurse and was tearful that she would be discharged without him.  She shares that she has a 30 month old son and that she also has a 83 year old and that she and her ex husband get along and co parent well with her husband.  She vacillates appropriately between sadness, anger, and hope.  Explained spiritual care services.  Will continue to follow infant in NICU.  [place], Chaplain      05/26/15 1530  Clinical Encounter Type  Visited With Patient and family together  Visit Type Initial;Spiritual support  Referral From Nurse  Consult/Referral To Chaplain  Spiritual Encounters  Spiritual Needs Emotional  Stress Factors  Patient Stress Factors Loss of control

## 2015-05-31 ENCOUNTER — Ambulatory Visit: Payer: Self-pay

## 2015-05-31 NOTE — Lactation Note (Signed)
This note was copied from the chart of [place]. Lactation Consultation Note  Patient Name: Boy Savina Olshefski [id number] Date: 05/31/2015 Reason for consult: Follow-up assessment;NICU baby  Baby 61 days old and first time going to the breast. Assisted mom with latch on R breast not using shield and baby would pop on/off latch. Started #20 shield on L side that did help baby maintain a longer latch. Mom reports BF her 7 yr for 2 m with a shied along with pumping and her 30 m old for 2 m using a shield along with pumping. Mom was able to manual express drops bilaterally to get baby interested in the breast. Reviewed pumping after feeds and shield care. Per RN's pre/post feed wt baby took in a total of 20 ml at the breast. Mom plans to return on 06/01/15 and will page [facility] for more latch help.    Maternal Data    Feeding Feeding Type: Breast Fed Nipple Type: Regular Length of feed: 10 min  LATCH Score/Interventions Latch: Repeated attempts needed to sustain latch, nipple held in mouth throughout feeding, stimulation needed to elicit sucking reflex. Intervention(s): Adjust position;Assist with latch;Breast compression  Audible Swallowing: A few with stimulation Intervention(s): Hand expression  Type of Nipple: Everted at rest and after stimulation  Comfort (Breast/Nipple): Soft / non-tender     Hold (Positioning): Full assist, staff holds infant at breast Intervention(s): Support Pillows;Position options  LATCH Score: 6  Lactation Tools Discussed/Used Tools: Nipple Shields Nipple shield size: 20   Consult Status Consult Status: Follow-up Date: 06/01/15 Follow-up type: In-patient    Denzil Hughes 05/31/2015, 5:56 PM

## 2015-06-03 ENCOUNTER — Encounter (HOSPITAL_COMMUNITY): Admission: RE | Payer: Self-pay | Source: Ambulatory Visit

## 2015-06-03 ENCOUNTER — Inpatient Hospital Stay (HOSPITAL_COMMUNITY)
Admission: RE | Admit: 2015-06-03 | Payer: Medicaid Other | Source: Ambulatory Visit | Admitting: Obstetrics and Gynecology

## 2015-06-03 SURGERY — Surgical Case
Anesthesia: Regional

## 2016-04-11 ENCOUNTER — Encounter (HOSPITAL_COMMUNITY): Payer: Self-pay | Admitting: Emergency Medicine

## 2016-04-11 ENCOUNTER — Emergency Department (HOSPITAL_COMMUNITY)
Admission: EM | Admit: 2016-04-11 | Discharge: 2016-04-11 | Disposition: A | Discharge status: Home / Self Care | Payer: Medicaid Other | Attending: Emergency Medicine | Admitting: Emergency Medicine

## 2016-04-11 DIAGNOSIS — R519 Headache, unspecified: Secondary | ICD-10-CM

## 2016-04-11 DIAGNOSIS — F329 Major depressive disorder, single episode, unspecified: Secondary | ICD-10-CM | POA: Insufficient documentation

## 2016-04-11 DIAGNOSIS — I10 Essential (primary) hypertension: Secondary | ICD-10-CM | POA: Insufficient documentation

## 2016-04-11 DIAGNOSIS — Z79899 Other long term (current) drug therapy: Secondary | ICD-10-CM | POA: Insufficient documentation

## 2016-04-11 DIAGNOSIS — F17210 Nicotine dependence, cigarettes, uncomplicated: Secondary | ICD-10-CM | POA: Insufficient documentation

## 2016-04-11 DIAGNOSIS — R51 Headache: Secondary | ICD-10-CM | POA: Insufficient documentation

## 2016-04-11 DIAGNOSIS — Z791 Long term (current) use of non-steroidal anti-inflammatories (NSAID): Secondary | ICD-10-CM | POA: Insufficient documentation

## 2016-04-11 LAB — CBC WITH DIFFERENTIAL/PLATELET
BASOS ABS: 0 10*3/uL (ref 0.0–0.1)
BASOS PCT: 1 %
EOS ABS: 0.2 10*3/uL (ref 0.0–0.7)
Eosinophils Relative: 3 %
HCT: 41.7 % (ref 36.0–46.0)
Hemoglobin: 13.7 g/dL (ref 12.0–15.0)
Lymphocytes Relative: 29 %
Lymphs Abs: 1.9 10*3/uL (ref 0.7–4.0)
MCH: 27.7 pg (ref 26.0–34.0)
MCHC: 32.9 g/dL (ref 30.0–36.0)
MCV: 84.2 fL (ref 78.0–100.0)
MONO ABS: 0.3 10*3/uL (ref 0.1–1.0)
Monocytes Relative: 5 %
NEUTROS ABS: 4.1 10*3/uL (ref 1.7–7.7)
Neutrophils Relative %: 62 %
PLATELETS: 243 10*3/uL (ref 150–400)
RBC: 4.95 MIL/uL (ref 3.87–5.11)
RDW: 13.6 % (ref 11.5–15.5)
WBC: 6.5 10*3/uL (ref 4.0–10.5)

## 2016-04-11 LAB — URINALYSIS, ROUTINE W REFLEX MICROSCOPIC
Bilirubin Urine: NEGATIVE
GLUCOSE, UA: NEGATIVE mg/dL
HGB URINE DIPSTICK: NEGATIVE
Nitrite: NEGATIVE
Protein, ur: NEGATIVE mg/dL
SPECIFIC GRAVITY, URINE: 1.015 (ref 1.005–1.030)
pH: 6 (ref 5.0–8.0)

## 2016-04-11 LAB — COMPREHENSIVE METABOLIC PANEL WITH GFR
ALT: 67 U/L — ABNORMAL HIGH (ref 14–54)
AST: 36 U/L (ref 15–41)
Albumin: 3.7 g/dL (ref 3.5–5.0)
Alkaline Phosphatase: 86 U/L (ref 38–126)
Anion gap: 8 (ref 5–15)
BUN: 10 mg/dL (ref 6–20)
CO2: 23 mmol/L (ref 22–32)
Calcium: 8.6 mg/dL — ABNORMAL LOW (ref 8.9–10.3)
Chloride: 107 mmol/L (ref 101–111)
Creatinine, Ser: 0.63 mg/dL (ref 0.44–1.00)
GFR calc Af Amer: >60 mL/min
GFR calc non Af Amer: >60 mL/min
Glucose, Bld: 104 mg/dL — ABNORMAL HIGH (ref 65–99)
Potassium: 3.7 mmol/L (ref 3.5–5.1)
Sodium: 138 mmol/L (ref 135–145)
Total Bilirubin: 0.5 mg/dL (ref 0.3–1.2)
Total Protein: 6.9 g/dL (ref 6.5–8.1)

## 2016-04-11 LAB — URINE MICROSCOPIC-ADD ON: RBC / HPF: NONE SEEN RBC/hpf (ref 0–5)

## 2016-04-11 MED ORDER — KETOROLAC TROMETHAMINE 30 MG/ML IJ SOLN
30.0000 mg | Freq: Once | INTRAMUSCULAR | Status: AC
  Administered 2016-04-11: 30 mg via INTRAVENOUS
  Filled 2016-04-11: qty 1

## 2016-04-11 MED ORDER — SODIUM CHLORIDE 0.9 % IV BOLUS (SEPSIS)
1000.0000 mL | Freq: Once | INTRAVENOUS | Status: AC
  Administered 2016-04-11: 1000 mL via INTRAVENOUS

## 2016-04-11 MED ORDER — ONDANSETRON HCL 4 MG/2ML IJ SOLN
4.0000 mg | Freq: Once | INTRAMUSCULAR | Status: AC
  Administered 2016-04-11: 4 mg via INTRAVENOUS
  Filled 2016-04-11: qty 2

## 2016-04-11 NOTE — Discharge Instructions (Signed)
Follow up with your md if needed °

## 2016-04-11 NOTE — ED Provider Notes (Signed)
[facility] DEPT Provider Note   CSN: [id number] Arrival date & time: 04/11/16  1203  First Provider Contact:  First MD Initiated Contact with Patient 04/11/16 1230        History   Chief Complaint Chief Complaint  Patient presents with  . Migraine    HPI Christina [name] is a 40 y.o. female.  She complains of a headache per day with nausea and weakness she states that she's felt like she's had a fever   The history is provided by the patient. No language interpreter was used.  Migraine  This is a recurrent problem. The current episode started 3 to 5 hours ago. The problem occurs constantly. The problem has not changed since onset.Associated symptoms include headaches. Pertinent negatives include no chest pain and no abdominal pain. Nothing aggravates the symptoms. Nothing relieves the symptoms. She has tried acetaminophen for the symptoms.    Past Medical History:  Diagnosis Date  . Anxiety   . Depression   . Gestational diabetes mellitus, antepartum 2015   managed with diet  . Hypertension    on meds until wt loss  . PONV (postoperative nausea and vomiting)   . Seizures ([place]) 2001   stressed induced    Patient Active Problem List   Diagnosis Date Noted  . S/P cesarean section 07/14/2014    Past Surgical History:  Procedure Laterality Date  . APPENDECTOMY    . CESAREAN SECTION    . CESAREAN SECTION N/A 07/14/2014   Procedure: CESAREAN SECTION;  Surgeon: Cheri Fowler, MD;  Location: [facility];  Service: Obstetrics;  Laterality: N/A;  . CESAREAN SECTION N/A 05/23/2015   Procedure: CESAREAN SECTION;  Surgeon: Cheri Fowler, MD;  Location: [facility];  Service: Obstetrics;  Laterality: N/A;    OB History    Gravida Para Term Preterm AB Living   4 3 3   1 3    SAB TAB Ectopic Multiple Live Births   1     0         Home Medications    Prior to Admission medications   Medication Sig Start Date End Date Taking? Authorizing Provider  acetaminophen (TYLENOL) 500  MG tablet Take 500 mg by mouth as needed for mild pain.    Historical Provider, MD  ibuprofen (ADVIL,MOTRIN) 600 MG tablet Take 1 tablet (600 mg total) by mouth every 6 (six) hours. Patient not taking: Reported on 04/11/2016 05/27/15   Cheri Fowler, MD  oxyCODONE-acetaminophen (PERCOCET/ROXICET) 5-325 MG per tablet Take 1-2 tablets by mouth every 4 (four) hours as needed for severe pain. Patient not taking: Reported on 04/11/2016 05/27/15   Cheri Fowler, MD    Family History History reviewed. No pertinent family history.  Social History Social History  Substance Use Topics  . Smoking status: Current Some Day Smoker    Packs/day: 0.25    Types: Cigarettes  . Smokeless tobacco: Never Used  . Alcohol use Yes     Comment: rarely; not during preg     Allergies   Claritin [loratadine] and Diphenhydramine   Review of Systems Review of Systems  Constitutional: Negative for appetite change and fatigue.  HENT: Negative for congestion, ear discharge and sinus pressure.   Eyes: Negative for discharge.  Respiratory: Negative for cough.   Cardiovascular: Negative for chest pain.  Gastrointestinal: Negative for abdominal pain and diarrhea.  Genitourinary: Negative for frequency and hematuria.  Musculoskeletal: Negative for back pain.  Skin: Negative for rash.  Neurological: Positive for headaches. Negative for  seizures.  Psychiatric/Behavioral: Negative for hallucinations.     Physical Exam Updated Vital Signs BP (!) 124/7 (BP Location: Right Arm)   Pulse (!) 50   Temp 98.5 F (36.9 C)   Resp 18   Ht 4\' 11"  (1.499 m)   Wt 207 lb (93.9 kg)   LMP 03/07/2016 (Approximate)   SpO2 97%   BMI 41.81 kg/m   Physical Exam  Constitutional: She is oriented to person, place, and time. She appears well-developed.  HENT:  Head: Normocephalic.  Eyes: Conjunctivae and EOM are normal. No scleral icterus.  Neck: Neck supple. No thyromegaly present.  Cardiovascular: Normal rate and regular  rhythm.  Exam reveals no gallop and no friction rub.   No murmur heard. Pulmonary/Chest: No stridor. She has no wheezes. She has no rales. She exhibits no tenderness.  Abdominal: She exhibits no distension. There is no tenderness. There is no rebound.  Musculoskeletal: Normal range of motion. She exhibits no edema.  Lymphadenopathy:    She has no cervical adenopathy.  Neurological: She is oriented to person, place, and time. She exhibits normal muscle tone. Coordination normal.  Skin: No rash noted. No erythema.  Psychiatric: She has a normal mood and affect. Her behavior is normal.     ED Treatments / Results  Labs (all labs ordered are listed, but only abnormal results are displayed) Labs Reviewed  COMPREHENSIVE METABOLIC PANEL - Abnormal; Notable for the following:       Result Value   Glucose, Bld 104 (*)    Calcium 8.6 (*)    ALT 67 (*)    All other components within normal limits  URINALYSIS, ROUTINE W REFLEX MICROSCOPIC (NOT AT [facility]) - Abnormal; Notable for the following:    Ketones, ur TRACE (*)    Leukocytes, UA TRACE (*)    All other components within normal limits  URINE MICROSCOPIC-ADD ON - Abnormal; Notable for the following:    Squamous Epithelial / LPF 6-30 (*)    Bacteria, UA RARE (*)    All other components within normal limits  CBC WITH DIFFERENTIAL/PLATELET    EKG  EKG Interpretation None       Radiology No results found.  Procedures Procedures (including critical care time)  Medications Ordered in ED Medications  sodium chloride 0.9 % bolus 1,000 mL (1,000 mLs Intravenous New Bag/Given 04/11/16 1305)  ketorolac (TORADOL) 30 MG/ML injection 30 mg (30 mg Intravenous Given 04/11/16 1305)  ondansetron (ZOFRAN) injection 4 mg (4 mg Intravenous Given 04/11/16 1305)     Initial Impression / Assessment and Plan / ED Course  I have reviewed the triage vital signs and the nursing notes.  Pertinent labs & imaging results that were available during my  care of the patient were reviewed by me and considered in my medical decision making (see chart for details).  Clinical Course    Patient's headache improved with migraine cocktail. Labs unremarkable. Patient will follow-up with PCP if any problems  Final Clinical Impressions(s) / ED Diagnoses   Final diagnoses:  Headache above the eye region    New Prescriptions New Prescriptions   No medications on file     Milton Ferguson, MD 04/11/16 1534

## 2016-04-11 NOTE — ED Triage Notes (Signed)
Woke up this am, c/o of migraine.  Rates pain 6/10.  C/o nausea and weakness.

## 2016-04-30 ENCOUNTER — Telehealth: Payer: Self-pay | Admitting: Obstetrics and Gynecology

## 2016-04-30 ENCOUNTER — Other Ambulatory Visit (HOSPITAL_COMMUNITY)
Admission: RE | Admit: 2016-04-30 | Discharge: 2016-04-30 | Disposition: A | Discharge status: Home / Self Care | Payer: Medicaid Other | Source: Ambulatory Visit | Attending: Obstetrics and Gynecology | Admitting: Obstetrics and Gynecology

## 2016-04-30 ENCOUNTER — Encounter: Payer: Self-pay | Admitting: Obstetrics and Gynecology

## 2016-04-30 ENCOUNTER — Ambulatory Visit (INDEPENDENT_AMBULATORY_CARE_PROVIDER_SITE_OTHER): Payer: Medicaid Other | Admitting: Obstetrics and Gynecology

## 2016-04-30 VITALS — BP 120/80 | Ht 59.0 in | Wt 209.0 lb

## 2016-04-30 DIAGNOSIS — Z3009 Encounter for other general counseling and advice on contraception: Secondary | ICD-10-CM

## 2016-04-30 DIAGNOSIS — Z113 Encounter for screening for infections with a predominantly sexual mode of transmission: Secondary | ICD-10-CM | POA: Insufficient documentation

## 2016-04-30 DIAGNOSIS — Z1151 Encounter for screening for human papillomavirus (HPV): Secondary | ICD-10-CM | POA: Insufficient documentation

## 2016-04-30 DIAGNOSIS — Z309 Encounter for contraceptive management, unspecified: Secondary | ICD-10-CM | POA: Insufficient documentation

## 2016-04-30 DIAGNOSIS — Z01419 Encounter for gynecological examination (general) (routine) without abnormal findings: Secondary | ICD-10-CM | POA: Insufficient documentation

## 2016-04-30 NOTE — Progress Notes (Signed)
  Assessment:  Annual Gyn Exam Contraception management, consult for sterilization Plan:  1. pap smear done  2. return annually or prn 3    Annual mammogram advised 4. Patient counseled over tubal sterilization  requested permanency 5. At preoperative visit I hope to discuss menstrual flow with patient to see if she needs to consider endometrial ablation Subjective:  Christina [name] is a 40 y.o. female [phone] who presents for annual exam. Patient's last menstrual period was 03/07/2016 (approximate). The patient has no complaints today. She is interested in a BTL.   The following portions of the patient's history were reviewed and updated as appropriate: allergies, current medications, past family history, past medical history, past social history, past surgical history and problem list. Past Medical History:  Diagnosis Date  . Anxiety   . Depression   . Gestational diabetes mellitus, antepartum 2015   managed with diet  . Hypertension    on meds until wt loss  . PONV (postoperative nausea and vomiting)   . Seizures ([place]) 2001   stressed induced    Past Surgical History:  Procedure Laterality Date  . APPENDECTOMY    . CESAREAN SECTION    . CESAREAN SECTION N/A 07/14/2014   Procedure: CESAREAN SECTION;  Surgeon: Cheri Fowler, MD;  Location: [facility];  Service: Obstetrics;  Laterality: N/A;  . CESAREAN SECTION N/A 05/23/2015   Procedure: CESAREAN SECTION;  Surgeon: Cheri Fowler, MD;  Location: [facility];  Service: Obstetrics;  Laterality: N/A;     Current Outpatient Prescriptions:  .  acetaminophen (TYLENOL) 500 MG tablet, Take 500 mg by mouth as needed for mild pain., Disp: , Rfl:   Review of Systems Constitutional: negative Gastrointestinal: negative Genitourinary: negative   Objective:  BP 120/80 (BP Location: Right Arm, Patient Position: Sitting, Cuff Size: Large)   Ht 4\' 11"  (1.499 m)   Wt 209 lb (94.8 kg)   LMP 03/07/2016 (Approximate)   BMI 42.21 kg/m    BMI: Body  mass index is 42.21 kg/m.  General Appearance: Alert, appropriate appearance for age. No acute distress HEENT: Grossly normal Neck / Thyroid:  Cardiovascular: RRR; normal S1, S2, no murmur Lungs: CTA bilaterally Back: No CVAT Breast Exam: No dimpling, nipple retraction or discharge. No masses or nodes., Normal to inspection, Normal breast tissue bilaterally and No masses or nodes.No dimpling, nipple retraction or discharge. Gastrointestinal: Soft, non-tender, no masses or organomegaly Pelvic Exam: External genitalia: normal general appearance Vaginal: normal mucosa without prolapse or lesions and normal without tenderness, induration or masses, good support Cervix: normal appearance and small  Adnexa: normal bimanual exam  Uterus: anterior  Rectovaginal: not indicated Lymphatic Exam: Non-palpable nodes in neck, clavicular, axillary, or inguinal regions  Skin: no rash or abnormalities Neurologic: Normal gait and speech, no tremor  Psychiatric: Alert and oriented, appropriate affect.  Urinalysis:Not done  Mallory Shirk. MD Pgr [phone] 3:46 PM

## 2016-05-03 LAB — CYTOLOGY - PAP

## 2016-05-07 ENCOUNTER — Telehealth: Payer: Self-pay | Admitting: *Deleted

## 2016-05-07 NOTE — Telephone Encounter (Signed)
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 Pt verbalized understanding.

## 2016-05-30 ENCOUNTER — Ambulatory Visit (INDEPENDENT_AMBULATORY_CARE_PROVIDER_SITE_OTHER): Payer: Medicaid Other | Admitting: Obstetrics and Gynecology

## 2016-05-30 ENCOUNTER — Encounter: Payer: Self-pay | Admitting: Obstetrics and Gynecology

## 2016-05-30 VITALS — BP 118/84 | HR 60 | Ht 59.0 in | Wt 202.4 lb

## 2016-05-30 DIAGNOSIS — Z309 Encounter for contraceptive management, unspecified: Secondary | ICD-10-CM

## 2016-05-30 DIAGNOSIS — Z302 Encounter for sterilization: Secondary | ICD-10-CM

## 2016-05-30 NOTE — Progress Notes (Signed)
Patient ID: Christina [name], female   DOB: 04-May-1976, 40 y.o.   MRN: [id number]  Preoperative History and Physical  Christina [name] is a 40 y.o. [id number] here for scheduling of surgical management of permanent sterilization. She has signed tubal sterilization papers 30 days ago she confirms desire for permanent sterilization No significant preoperative concerns. Pt states her period last month was light and lasted less than one day. She states she is not currently using any birth control methods aside from the withdrawal method and "avoiding her husband".   Discussed with pt risks and benefits of endometrial ablation in addition to tubal ligation. At end of discussion, pt had opportunity to ask questions and has no further questions at this time. She states she is comfortable with the length and flow of her periods at this time and does not wish to proceed with ablation.   Greater than 50% was spent in counseling and coordination of care with the patient. Total time greater than: 25 minutes   Proposed surgery: bilateral salpingectomy   Past Medical History:  Diagnosis Date  . Anxiety   . Depression   . Gestational diabetes mellitus, antepartum 2015   managed with diet  . Hypertension    on meds until wt loss  . PONV (postoperative nausea and vomiting)   . Seizures ([place]) 2001   stressed induced   Past Surgical History:  Procedure Laterality Date  . APPENDECTOMY    . CESAREAN SECTION    . CESAREAN SECTION N/A 07/14/2014   Procedure: CESAREAN SECTION;  Surgeon: Cheri Fowler, MD;  Location: [facility];  Service: Obstetrics;  Laterality: N/A;  . CESAREAN SECTION N/A 05/23/2015   Procedure: CESAREAN SECTION;  Surgeon: Cheri Fowler, MD;  Location: [facility];  Service: Obstetrics;  Laterality: N/A;   OB History  Gravida Para Term Preterm AB Living  4 3 3   1 3   SAB TAB Ectopic Multiple Live Births  1     0 3    # Outcome Date GA Lbr Len/2nd Weight Sex Delivery Anes PTL Lv  4 Term 05/23/15 [redacted]w[redacted]d   7 lb 0.2 oz (3.18 kg) M CS-LTranv Gen  LIV  3 Term 07/14/14 [redacted]w[redacted]d   M CS-LTranv Spinal  LIV  2 Term 2009    M CS-LTranv EPI  LIV  1 SAB 2001            Patient denies any other pertinent gynecologic issues.   Current Outpatient Prescriptions on File Prior to Visit  Medication Sig Dispense Refill  . acetaminophen (TYLENOL) 500 MG tablet Take 500 mg by mouth as needed for mild pain.     No current facility-administered medications on file prior to visit.    Allergies  Allergen Reactions  . Claritin [Loratadine] Other (See Comments)    causes spasms  . Diphenhydramine Other (See Comments)    Causes spasms and keeps alert for days    Social History:   reports that she has been smoking Cigarettes.  She has been smoking about 0.25 packs per day. She has never used smokeless tobacco. She reports that she drinks alcohol. She reports that she does not use drugs.  Family History  Problem Relation Age of Onset  . Cancer Father   . Hypertension Mother   . Diabetes Mother   . Autism Son     Review of Systems: Noncontributory  PHYSICAL EXAM: unknown if currently breastfeeding. General appearance - alert, well appearing, and in no distress Chest - clear to  auscultation, no wheezes, rales or rhonchi, symmetric air entry Heart - normal rate and regular rhythm Extremities - peripheral pulses normal, no pedal edema, no clubbing or cyanosis Abdomen- soft, nontender, no masses or organomegaly, umbilicus with normal appearance  Labs: No results found for this or any previous visit (from the past 336 hour(s)).  Imaging Studies: No results found.  Assessment: Patient Active Problem List   Diagnosis Date Noted  . Well woman exam with routine gynecological exam 04/30/2016  . Encounter for contraceptive management 04/30/2016  . S/P cesarean section 07/14/2014    Plan: 1. Patient will undergo surgical management with bilateral salpingectomy.  2. Pt strongly encouraged to exclusively use  condoms until procedure or practice abstinence  3. Will obtain serum HCG    .mec 05/30/2016 10:19 AM    By signing my name below, I, Hansel Feinstein, attest that this documentation has been prepared under the direction and in the presence of Jonnie Kind, MD. Electronically Signed: Hansel Feinstein, ED Scribe. 05/30/16. 10:23 AM.  I personally performed the services described in this documentation, which was SCRIBED in my presence. The recorded information has been reviewed and considered accurate. It has been edited as necessary during review. Jonnie Kind, MD

## 2016-06-07 NOTE — Patient Instructions (Signed)
Christina [name]  06/07/2016     @PREFPERIOPPHARMACY @   Your procedure is scheduled on 06/12/2016.  Report to [facility] at 9:20 A.M.  Call this number if you have problems the morning of surgery:  [phone]   Remember:  Do not eat food or drink liquids after midnight.  Take these medicines the morning of surgery with A SIP OF WATER None   Do not wear jewelry, make-up or nail polish.  Do not wear lotions, powders, or perfumes, or deoderant.  Do not shave 48 hours prior to surgery.  Men may shave face and neck.  Do not bring valuables to the hospital.  [facility] is not responsible for any belongings or valuables.  Contacts, dentures or bridgework may not be worn into surgery.  Leave your suitcase in the car.  After surgery it may be brought to your room.  For patients admitted to the hospital, discharge time will be determined by your treatment team.  Patients discharged the day of surgery will not be allowed to drive home.   Please read over the following fact sheets that you were given. Surgical Site Infection Prevention and Anesthesia Post-op Instructions     PATIENT INSTRUCTIONS POST-ANESTHESIA  IMMEDIATELY FOLLOWING SURGERY:  Do not drive or operate machinery for the first twenty four hours after surgery.  Do not make any important decisions for twenty four hours after surgery or while taking narcotic pain medications or sedatives.  If you develop intractable nausea and vomiting or a severe headache please notify your doctor immediately.  FOLLOW-UP:  Please make an appointment with your surgeon as instructed. You do not need to follow up with anesthesia unless specifically instructed to do so.  WOUND CARE INSTRUCTIONS (if applicable):  Keep a dry clean dressing on the anesthesia/puncture wound site if there is drainage.  Once the wound has quit draining you may leave it open to air.  Generally you should leave the bandage intact for twenty four hours unless there is  drainage.  If the epidural site drains for more than 36-48 hours please call the anesthesia department.  QUESTIONS?:  Please feel free to call your physician or the hospital operator if you have any questions, and they will be happy to assist you.      Salpingectomy Salpingectomy, also called tubectomy, is the surgical removal of one of the fallopian tubes. The fallopian tubes are tubes that are connected to the uterus. These tubes transport the egg from the ovary to the uterus. A salpingectomy may be done for various reasons, including:   A tubal (ectopic) pregnancy. This is especially true if the tube ruptures.  An infected fallopian tube.  The need to remove the fallopian tube when removing an ovary with a cyst or tumor.  The need to remove the fallopian tube when removing the uterus.  Cancer of the fallopian tube or nearby organs. Removing one fallopian tube does not prevent you from becoming pregnant. It also does not cause problems with your menstrual periods.  LET [facility] CARE PROVIDER KNOW ABOUT:  Any allergies you have.  All medicines you are taking, including vitamins, herbs, eye drops, creams, and over-the-counter medicines.  Previous problems you or members of your family have had with the use of anesthetics.  Any blood disorders you have.  Previous surgeries you have had.  Medical conditions you have. RISKS AND COMPLICATIONS  Generally, this is a safe procedure. However, as with any procedure, complications can occur. Possible complications include:  Injury to surrounding organs.  Bleeding.  Infection.  Problems related to anesthesia. BEFORE THE PROCEDURE  Ask your health care provider about changing or stopping your regular medicines. You may need to stop taking certain medicines, such as aspirin or blood thinners, at least 1 week before the surgery.  Do not eat or drink anything for at least 8 hours before the surgery.  If you smoke, do not smoke for  at least 2 weeks before the surgery.  Make plans to have someone drive you home after the procedure or after your hospital stay. Also arrange for someone to help you with activities during recovery. PROCEDURE   You will be given medicine to help you relax before the procedure (sedative). You will then be given medicine to make you sleep through the procedure (general anesthetic). These medicines will be given through an IV access tube that is put into one of your veins.  Once you are asleep, your lower abdomen will be shaved and cleaned. A thin, flexible tube (catheter) will be placed in your bladder.  The surgeon may use a laparoscopic, robotic, or open technique for this surgery:  In the laparoscopic technique, the surgery is done through two small cuts (incisions) in the abdomen. A thin, lighted tube with a tiny camera on the end (laparoscope) is inserted into one of the incisions. The tools needed for the procedure are put through the other incision.  A robotic technique may be chosen to perform complex surgery in a small space. In the robotic technique, small incisions will be made. A camera and surgical instruments are passed through the incisions. Surgical instruments will be controlled with the help of a robotic arm.  In the open technique, the surgery is done through one large incision in the abdomen.  Using any of these techniques, the surgeon removes the fallopian tube from where it attaches to the uterus. The blood vessels will be clamped and tied.  The surgeon then uses staples or stitches to close the incision or incisions. AFTER THE PROCEDURE   You will be taken to a recovery area where your progress will be monitored for 1-3 hours.  If the laparoscopic technique was used, you may be allowed to go home after several hours. You may have some shoulder pain after the laparoscopic procedure. This is normal and usually goes away in a day or two.  If the open technique was used,  you will be admitted to the hospital for a couple of days.  You will be given pain medicine if needed.  The IV access tube and catheter will be removed before you are discharged.   This information is not intended to replace advice given to you by your health care provider. Make sure you discuss any questions you have with your health care provider.   Document Released: 01/20/2009 Document Revised: 09/24/2014 Document Reviewed: 02/25/2013 Elsevier Interactive Patient Education [employer].

## 2016-06-08 ENCOUNTER — Encounter (HOSPITAL_COMMUNITY): Payer: Self-pay

## 2016-06-08 ENCOUNTER — Encounter (HOSPITAL_COMMUNITY)
Admission: RE | Admit: 2016-06-08 | Discharge: 2016-06-08 | Disposition: A | Discharge status: Home / Self Care | Payer: Medicaid Other | Source: Ambulatory Visit | Attending: Obstetrics and Gynecology | Admitting: Obstetrics and Gynecology

## 2016-06-08 DIAGNOSIS — Z01812 Encounter for preprocedural laboratory examination: Secondary | ICD-10-CM | POA: Insufficient documentation

## 2016-06-08 DIAGNOSIS — Z9889 Other specified postprocedural states: Secondary | ICD-10-CM | POA: Insufficient documentation

## 2016-06-08 DIAGNOSIS — F418 Other specified anxiety disorders: Secondary | ICD-10-CM | POA: Insufficient documentation

## 2016-06-08 DIAGNOSIS — F17210 Nicotine dependence, cigarettes, uncomplicated: Secondary | ICD-10-CM | POA: Insufficient documentation

## 2016-06-08 DIAGNOSIS — Z888 Allergy status to other drugs, medicaments and biological substances status: Secondary | ICD-10-CM | POA: Diagnosis not present

## 2016-06-08 DIAGNOSIS — I10 Essential (primary) hypertension: Secondary | ICD-10-CM | POA: Diagnosis not present

## 2016-06-08 LAB — CBC
HCT: 41.7 % (ref 36.0–46.0)
Hemoglobin: 14 g/dL (ref 12.0–15.0)
MCH: 28.1 pg (ref 26.0–34.0)
MCHC: 33.6 g/dL (ref 30.0–36.0)
MCV: 83.7 fL (ref 78.0–100.0)
PLATELETS: 244 10*3/uL (ref 150–400)
RBC: 4.98 MIL/uL (ref 3.87–5.11)
RDW: 13.1 % (ref 11.5–15.5)
WBC: 5.9 10*3/uL (ref 4.0–10.5)

## 2016-06-08 LAB — URINALYSIS, ROUTINE W REFLEX MICROSCOPIC
BILIRUBIN URINE: NEGATIVE
Glucose, UA: NEGATIVE mg/dL
Hgb urine dipstick: NEGATIVE
KETONES UR: NEGATIVE mg/dL
LEUKOCYTES UA: NEGATIVE
NITRITE: NEGATIVE
PROTEIN: NEGATIVE mg/dL
Specific Gravity, Urine: 1.025 (ref 1.005–1.030)
pH: 6 (ref 5.0–8.0)

## 2016-06-08 LAB — BASIC METABOLIC PANEL
Anion gap: 7 (ref 5–15)
BUN: 11 mg/dL (ref 6–20)
CALCIUM: 8.9 mg/dL (ref 8.9–10.3)
CHLORIDE: 107 mmol/L (ref 101–111)
CO2: 24 mmol/L (ref 22–32)
CREATININE: 0.63 mg/dL (ref 0.44–1.00)
Glucose, Bld: 101 mg/dL — ABNORMAL HIGH (ref 65–99)
Potassium: 4.2 mmol/L (ref 3.5–5.1)
SODIUM: 138 mmol/L (ref 135–145)

## 2016-06-08 LAB — HCG, SERUM, QUALITATIVE: PREG SERUM: NEGATIVE

## 2016-06-08 NOTE — Pre-Procedure Instructions (Signed)
Patient given information to sign up for my chart at home. 

## 2016-06-12 ENCOUNTER — Ambulatory Visit (HOSPITAL_COMMUNITY)
Admission: RE | Admit: 2016-06-12 | Discharge: 2016-06-12 | Disposition: A | Discharge status: Home / Self Care | Payer: Medicaid Other | Source: Ambulatory Visit | Attending: Obstetrics and Gynecology | Admitting: Obstetrics and Gynecology

## 2016-06-12 ENCOUNTER — Ambulatory Visit (HOSPITAL_COMMUNITY): Payer: Medicaid Other | Admitting: Anesthesiology

## 2016-06-12 ENCOUNTER — Encounter (HOSPITAL_COMMUNITY): Payer: Self-pay | Admitting: *Deleted

## 2016-06-12 ENCOUNTER — Encounter (HOSPITAL_COMMUNITY)
Admission: RE | Disposition: A | Discharge status: Home / Self Care | Payer: Self-pay | Source: Ambulatory Visit | Attending: Obstetrics and Gynecology

## 2016-06-12 DIAGNOSIS — Z302 Encounter for sterilization: Secondary | ICD-10-CM | POA: Insufficient documentation

## 2016-06-12 DIAGNOSIS — Z833 Family history of diabetes mellitus: Secondary | ICD-10-CM | POA: Insufficient documentation

## 2016-06-12 DIAGNOSIS — Z8632 Personal history of gestational diabetes: Secondary | ICD-10-CM | POA: Insufficient documentation

## 2016-06-12 DIAGNOSIS — Z888 Allergy status to other drugs, medicaments and biological substances status: Secondary | ICD-10-CM | POA: Insufficient documentation

## 2016-06-12 DIAGNOSIS — F329 Major depressive disorder, single episode, unspecified: Secondary | ICD-10-CM | POA: Diagnosis not present

## 2016-06-12 DIAGNOSIS — Z8249 Family history of ischemic heart disease and other diseases of the circulatory system: Secondary | ICD-10-CM | POA: Insufficient documentation

## 2016-06-12 DIAGNOSIS — I10 Essential (primary) hypertension: Secondary | ICD-10-CM | POA: Insufficient documentation

## 2016-06-12 DIAGNOSIS — Z818 Family history of other mental and behavioral disorders: Secondary | ICD-10-CM | POA: Insufficient documentation

## 2016-06-12 DIAGNOSIS — Z809 Family history of malignant neoplasm, unspecified: Secondary | ICD-10-CM | POA: Insufficient documentation

## 2016-06-12 DIAGNOSIS — R569 Unspecified convulsions: Secondary | ICD-10-CM | POA: Insufficient documentation

## 2016-06-12 DIAGNOSIS — F419 Anxiety disorder, unspecified: Secondary | ICD-10-CM | POA: Diagnosis not present

## 2016-06-12 HISTORY — PX: LAPAROSCOPIC BILATERAL SALPINGECTOMY: SHX5889

## 2016-06-12 SURGERY — SALPINGECTOMY, BILATERAL, LAPAROSCOPIC
Anesthesia: General | Site: Abdomen | Laterality: Bilateral

## 2016-06-12 MED ORDER — LIDOCAINE HCL (PF) 1 % IJ SOLN
INTRAMUSCULAR | Status: AC
  Filled 2016-06-12: qty 10

## 2016-06-12 MED ORDER — ONDANSETRON HCL 4 MG/2ML IJ SOLN
4.0000 mg | Freq: Once | INTRAMUSCULAR | Status: AC
  Administered 2016-06-12: 4 mg via INTRAVENOUS

## 2016-06-12 MED ORDER — ROCURONIUM BROMIDE 50 MG/5ML IV SOLN
INTRAVENOUS | Status: AC
  Filled 2016-06-12: qty 1

## 2016-06-12 MED ORDER — FENTANYL CITRATE (PF) 100 MCG/2ML IJ SOLN
INTRAMUSCULAR | Status: DC | PRN
  Administered 2016-06-12: 50 ug via INTRAVENOUS
  Administered 2016-06-12 (×2): 100 ug via INTRAVENOUS

## 2016-06-12 MED ORDER — SUCCINYLCHOLINE CHLORIDE 20 MG/ML IJ SOLN
INTRAMUSCULAR | Status: DC | PRN
  Administered 2016-06-12: 100 mg via INTRAVENOUS

## 2016-06-12 MED ORDER — NEOSTIGMINE METHYLSULFATE 10 MG/10ML IV SOLN
INTRAVENOUS | Status: AC
  Filled 2016-06-12: qty 1

## 2016-06-12 MED ORDER — GLYCOPYRROLATE 0.2 MG/ML IJ SOLN
0.2000 mg | Freq: Once | INTRAMUSCULAR | Status: AC
  Administered 2016-06-12: 0.2 mg via INTRAVENOUS

## 2016-06-12 MED ORDER — LIDOCAINE HCL (CARDIAC) 20 MG/ML IV SOLN
INTRAVENOUS | Status: DC | PRN
  Administered 2016-06-12: 50 mg via INTRATRACHEAL

## 2016-06-12 MED ORDER — GLYCOPYRROLATE 0.2 MG/ML IJ SOLN
INTRAMUSCULAR | Status: AC
  Filled 2016-06-12: qty 3

## 2016-06-12 MED ORDER — LACTATED RINGERS IV SOLN
INTRAVENOUS | Status: DC
  Administered 2016-06-12 (×3): via INTRAVENOUS

## 2016-06-12 MED ORDER — HYDROMORPHONE HCL 1 MG/ML IJ SOLN
0.2500 mg | INTRAMUSCULAR | Status: DC | PRN
  Administered 2016-06-12: 0.5 mg via INTRAVENOUS

## 2016-06-12 MED ORDER — PROPOFOL 10 MG/ML IV BOLUS
INTRAVENOUS | Status: AC
  Filled 2016-06-12: qty 20

## 2016-06-12 MED ORDER — ACETAMINOPHEN-CODEINE #3 300-30 MG PO TABS: 2.0000 | ORAL_TABLET | ORAL | 0 refills | Status: DC | PRN

## 2016-06-12 MED ORDER — ONDANSETRON HCL 4 MG/2ML IJ SOLN
INTRAMUSCULAR | Status: AC
  Filled 2016-06-12: qty 2

## 2016-06-12 MED ORDER — FENTANYL CITRATE (PF) 250 MCG/5ML IJ SOLN
INTRAMUSCULAR | Status: AC
  Filled 2016-06-12: qty 5

## 2016-06-12 MED ORDER — PROPOFOL 10 MG/ML IV BOLUS
INTRAVENOUS | Status: DC | PRN
  Administered 2016-06-12: 150 mg via INTRAVENOUS

## 2016-06-12 MED ORDER — HYDROMORPHONE HCL 1 MG/ML IJ SOLN
INTRAMUSCULAR | Status: AC
  Filled 2016-06-12: qty 1

## 2016-06-12 MED ORDER — GLYCOPYRROLATE 0.2 MG/ML IJ SOLN
INTRAMUSCULAR | Status: AC
  Filled 2016-06-12: qty 1

## 2016-06-12 MED ORDER — MIDAZOLAM HCL 2 MG/2ML IJ SOLN
INTRAMUSCULAR | Status: AC
  Filled 2016-06-12: qty 2

## 2016-06-12 MED ORDER — DEXAMETHASONE SODIUM PHOSPHATE 4 MG/ML IJ SOLN
INTRAMUSCULAR | Status: AC
  Filled 2016-06-12: qty 2

## 2016-06-12 MED ORDER — MIDAZOLAM HCL 2 MG/2ML IJ SOLN
1.0000 mg | INTRAMUSCULAR | Status: DC | PRN
  Administered 2016-06-12: 2 mg via INTRAVENOUS

## 2016-06-12 MED ORDER — 0.9 % SODIUM CHLORIDE (POUR BTL) OPTIME
TOPICAL | Status: DC | PRN
  Administered 2016-06-12: 1000 mL

## 2016-06-12 MED ORDER — ROCURONIUM BROMIDE 50 MG/5ML IV SOLN
INTRAVENOUS | Status: AC
  Filled 2016-06-12: qty 2

## 2016-06-12 SURGICAL SUPPLY — 37 items
BAG HAMPER (MISCELLANEOUS) ×3 IMPLANT
BANDAGE STRIP 1X3 FLEXIBLE (GAUZE/BANDAGES/DRESSINGS) ×9 IMPLANT
BLADE SURG SZ11 CARB STEEL (BLADE) ×3 IMPLANT
CATH ROBINSON RED A/P 16FR (CATHETERS) ×1 IMPLANT
CLOSURE STERI-STRIP 1/4X4 (GAUZE/BANDAGES/DRESSINGS) ×3 IMPLANT
CLOSURE WOUND 1/4 X3 (GAUZE/BANDAGES/DRESSINGS) ×1
CLOTH BEACON ORANGE TIMEOUT ST (SAFETY) ×3 IMPLANT
COVER LIGHT HANDLE STERIS (MISCELLANEOUS) ×6 IMPLANT
DECANTER SPIKE VIAL GLASS SM (MISCELLANEOUS) ×1 IMPLANT
DURAPREP 26ML APPLICATOR (WOUND CARE) ×3 IMPLANT
ELECT REM PT RETURN 9FT ADLT (ELECTROSURGICAL) ×3
ELECTRODE REM PT RTRN 9FT ADLT (ELECTROSURGICAL) ×1 IMPLANT
FORMALIN 10 PREFIL 120ML (MISCELLANEOUS) ×3 IMPLANT
GLOVE BIOGEL PI IND STRL 7.0 (GLOVE) ×1 IMPLANT
GLOVE BIOGEL PI IND STRL 9 (GLOVE) ×1 IMPLANT
GLOVE BIOGEL PI INDICATOR 7.0 (GLOVE) ×2
GLOVE BIOGEL PI INDICATOR 9 (GLOVE) ×2
GLOVE ECLIPSE 9.0 STRL (GLOVE) ×6 IMPLANT
GOWN SPEC L3 XXLG W/TWL (GOWN DISPOSABLE) ×3 IMPLANT
GOWN STRL REUS W/TWL LRG LVL3 (GOWN DISPOSABLE) ×3 IMPLANT
INST SET LAPROSCOPIC GYN AP (KITS) ×3 IMPLANT
KIT ROOM TURNOVER APOR (KITS) ×3 IMPLANT
NEEDLE INSUFFLATION 120MM (ENDOMECHANICALS) ×3 IMPLANT
NS IRRIG 1000ML POUR BTL (IV SOLUTION) ×3 IMPLANT
PACK PERI GYN (CUSTOM PROCEDURE TRAY) ×3 IMPLANT
PAD ARMBOARD 7.5X6 YLW CONV (MISCELLANEOUS) ×3 IMPLANT
SET BASIN LINEN APH (SET/KITS/TRAYS/PACK) ×3 IMPLANT
SHEARS HARMONIC ACE PLUS 36CM (ENDOMECHANICALS) ×3 IMPLANT
SLEEVE ENDOPATH XCEL 5M (ENDOMECHANICALS) ×6 IMPLANT
SOLUTION ANTI FOG 6CC (MISCELLANEOUS) ×3 IMPLANT
STRIP CLOSURE SKIN 1/4X3 (GAUZE/BANDAGES/DRESSINGS) ×1 IMPLANT
SUT VIC AB 4-0 PS2 27 (SUTURE) ×3 IMPLANT
SYR 30ML LL (SYRINGE) ×3 IMPLANT
SYRINGE 10CC LL (SYRINGE) ×3 IMPLANT
TROCAR XCEL NON-BLD 5MMX100MML (ENDOMECHANICALS) ×3 IMPLANT
TUBING INSUFFLATION (TUBING) ×3 IMPLANT
WARMER LAPAROSCOPE (MISCELLANEOUS) ×3 IMPLANT

## 2016-06-12 NOTE — Transfer of Care (Signed)
Immediate Anesthesia Transfer of Care Note  Patient: Christina [name]  Procedure(s) Performed: Procedure(s): LAPAROSCOPIC BILATERAL SALPINGECTOMY (Bilateral)  Patient Location: PACU  Anesthesia Type:General  Level of Consciousness: awake, alert , oriented and patient cooperative  Airway & Oxygen Therapy: Patient Spontanous Breathing and Patient connected to nasal cannula oxygen  Post-op Assessment: Report given to RN and Post -op Vital signs reviewed and stable  Post vital signs: Reviewed and stable  Last Vitals:  Vitals:   06/12/16 0932 06/12/16 1000  BP: 135/77 (!) 145/74  Pulse: (!) 50   Resp:  18  Temp: 36.7 C     Last Pain:  Vitals:   06/12/16 0932  TempSrc: Oral         Complications: No apparent anesthesia complications

## 2016-06-12 NOTE — Discharge Instructions (Signed)
Salpingectomy, Care After Refer to this sheet in the next few weeks. These instructions provide you with information on caring for yourself after your procedure. Your health care provider may also give you more specific instructions. Your treatment has been planned according to current medical practices, but problems sometimes occur. Call your health care provider if you have any problems or questions after your procedure. WHAT TO EXPECT AFTER THE PROCEDURE After your procedure, it is typical to have the following:  Abdominal pain that can be controlled with pain medicine.  Vaginal spotting.  Tiredness. HOME CARE INSTRUCTIONS  Get plenty of rest and sleep.  Only take over-the-counter or prescription medicines as directed by your health care provider.  Keep incision areas clean and dry. Remove or change any bandages (dressings) only as directed by your health care provider.  You may resume your regular diet. Eat a well-balanced diet.  Drink enough fluids to keep your urine clear or pale yellow.  Limit exercise and activities as directed by your health care provider. Do not lift anything heavier than 5 lb (2.3 kg) until your health care provider approves.  Do not drive until your health care provider approves.  Do not have sexual intercourse until your health care provider says it is okay.  Take your temperature twice a day for the first week. Write those temperatures down.  Follow up with your health care provider as directed. SEEK MEDICAL CARE IF:  You have pain when you urinate.  You see pus coming out of the incision, or the incision is separating.  You have increasing abdominal pain.  You have swelling or redness in the incision area.  You develop a rash.  You feel lightheaded.  You have pain that is not controlled with medicine. SEEK IMMEDIATE MEDICAL CARE IF:  You develop a fever.  You have increasing abdominal pain.  You develop chest or leg pain.  You  develop shortness of breath.  You pass out.   This information is not intended to replace advice given to you by your health care provider. Make sure you discuss any questions you have with your health care provider.   Document Released: 12/08/2010 Document Revised: 09/24/2014 Document Reviewed: 02/25/2013 Elsevier Interactive Patient Education [employer].

## 2016-06-12 NOTE — Brief Op Note (Signed)
06/12/2016  12:17 PM  PATIENT:  Christina [name]  40 y.o. female  PRE-OPERATIVE DIAGNOSIS:  sterilization elective permanent  POST-OPERATIVE DIAGNOSIS:  sterilization elective permanent  PROCEDURE:  Procedure(s): LAPAROSCOPIC BILATERAL SALPINGECTOMY (Bilateral)  SURGEON:  Surgeon(s) and Role:    * Jonnie Kind, MD - Primary  PHYSICIAN ASSISTANT:   ASSISTANTS: Zoila Shutter CST   ANESTHESIA:   general  EBL:  Total I/O In: [id number] [P.O.:222; I.V.:1000] Out: 25 [Blood:25]  BLOOD ADMINISTERED:none  DRAINS: none   LOCAL MEDICATIONS USED:  NONE  SPECIMEN:  Source of Specimen:  Bilateral fallopian tubes  DISPOSITION OF SPECIMEN:  PATHOLOGY  COUNTS:  YES  TOURNIQUET:  * No tourniquets in log *  DICTATION: .Dragon Dictation  PLAN OF CARE: Discharge to home after PACU  PATIENT DISPOSITION:  PACU - hemodynamically stable.   Delay start of Pharmacological VTE agent (>24hrs) due to surgical blood loss or risk of bleeding: not applicable Details of procedure: Patient was taken operating room prepped and draped for combined abdominal and vaginal procedure with legs in low lithotomy support and timeout conducted. No antibiotics were required. An infraumbilical vertical 1 Simmons skin incision as well as a transverse suprapubic and right lower quadrant trochars trocar sites were prepared. This needle was introduced through the umbilicus and the abdomen insufflated after water droplet technique used to confirm intraperitoneal location. Laparoscopic trocar was introduced through the umbilicus confirming normal appearing internal organs. There was identification of some omental attachments to the old C-section scar. Suprapubic and right lower quadrant trochars were placed under direct visualization and the omental anterior abdominal wall removed. Attention then directed to the pelvis where the fallopian tubes could be identified and next to the ovaries, and followed to the fimbriated end.  Salpingectomy was performed bilaterally by using harmonic scalpel to cross-clamp and used the Harmonic Ace 7 setting to coagulate the mesosalpinx and the tube was removed and extracted. After completion of both salpingectomies, 120 cc of saline was instilled in the abdomen to assist with evacuation of carbon dioxide, and then the abdomen deflated, trochars removed and subcuticular 4-0 Vicryl used to close all 3 skin incisions. Right lower quadrant trocar showed some ecchymosis due to  a small vessel encountered in the superficial skin. Steri-Strips were applied followed by Band-Aids and patient went to recovery room in good condition

## 2016-06-12 NOTE — Anesthesia Postprocedure Evaluation (Signed)
Anesthesia Post Note  Patient: Christina [name]  Procedure(s) Performed: Procedure(s) (LRB): LAPAROSCOPIC BILATERAL SALPINGECTOMY (Bilateral)  Patient location during evaluation: PACU Anesthesia Type: General Level of consciousness: awake and alert, oriented and patient cooperative Pain management: satisfactory to patient Vital Signs Assessment: post-procedure vital signs reviewed and stable Respiratory status: spontaneous breathing Cardiovascular status: blood pressure returned to baseline and stable Postop Assessment: no headache, no backache, no signs of nausea or vomiting and adequate PO intake Anesthetic complications: no    Last Vitals:  Vitals:   06/12/16 0932 06/12/16 1000  BP: 135/77 (!) 145/74  Pulse: (!) 50   Resp:  18  Temp: 36.7 C     Last Pain:  Vitals:   06/12/16 0932  TempSrc: Oral                 Cecila Satcher

## 2016-06-12 NOTE — Anesthesia Preprocedure Evaluation (Signed)
Anesthesia Evaluation  Patient identified by MRN, date of birth, ID band Patient awake    Reviewed: Allergy & Precautions, NPO status , Patient's Chart, lab work & pertinent test results  History of Anesthesia Complications (+) PONV and history of anesthetic complications  Airway Mallampati: II  TM Distance: >3 FB Neck ROM: Full    Dental  (+) Teeth Intact, Caps,  Prominent overbite  :   Pulmonary Current Smoker,    breath sounds clear to auscultation       Cardiovascular hypertension, Pt. on medications  Rhythm:Regular Rate:Normal     Neuro/Psych Seizures -, Well Controlled,  PSYCHIATRIC DISORDERS Anxiety Depression    GI/Hepatic GERD (reflux last night)  ,  Endo/Other  diabetes (gestational)  Renal/GU      Musculoskeletal   Abdominal   Peds  Hematology   Anesthesia Other Findings   Reproductive/Obstetrics                             Anesthesia Physical Anesthesia Plan  ASA: II  Anesthesia Plan: General   Post-op Pain Management:    Induction: Intravenous, Rapid sequence and Cricoid pressure planned  Airway Management Planned: Oral ETT  Additional Equipment:   Intra-op Plan:   Post-operative Plan: Extubation in OR  Informed Consent: I have reviewed the patients History and Physical, chart, labs and discussed the procedure including the risks, benefits and alternatives for the proposed anesthesia with the patient or authorized representative who has indicated his/her understanding and acceptance.     Plan Discussed with:   Anesthesia Plan Comments:         Anesthesia Quick Evaluation

## 2016-06-12 NOTE — Op Note (Signed)
Please see the brief operative note for surgical details 

## 2016-06-12 NOTE — H&P (Signed)
Expand All Collapse All   Patient ID: ERMER CLATTERBUCK, female   DOB: 10-01-1975, 40 y.o.   MRN: [id number]  Preoperative History and Physical  Christina [name] is a 40 y.o. [id number] here for scheduling of surgical management of permanent sterilization. She has signed tubal sterilization papers 30 days ago she confirms desire for permanent sterilization No significant preoperative concerns. Pt states her period last month was light and lasted less than one day. She states she is not currently using any birth control methods aside from the withdrawal method and "avoiding her husband".   Discussed with pt risks and benefits of endometrial ablation in addition to tubal ligation. At end of discussion, pt had opportunity to ask questions and has no further questions at this time. She states she is comfortable with the length and flow of her periods at this time and does not wish to proceed with ablation.   Greater than 50% was spent in counseling and coordination of care with the patient. Total time greater than: 25 minutes   Proposed surgery: bilateral salpingectomy   Past Medical History:  Diagnosis Date  . Anxiety   . Depression   . Gestational diabetes mellitus, antepartum 2015   managed with diet  . Hypertension    on meds until wt loss  . PONV (postoperative nausea and vomiting)   . Seizures ([place]) 2001   stressed induced        Past Surgical History:  Procedure Laterality Date  . APPENDECTOMY    . CESAREAN SECTION    . CESAREAN SECTION N/A 07/14/2014   Procedure: CESAREAN SECTION;  Surgeon: Cheri Fowler, MD;  Location: [facility];  Service: Obstetrics;  Laterality: N/A;  . CESAREAN SECTION N/A 05/23/2015   Procedure: CESAREAN SECTION;  Surgeon: Cheri Fowler, MD;  Location: [facility];  Service: Obstetrics;  Laterality: N/A;                   OB History  Gravida Para Term Preterm AB Living  4 3 3   1 3   SAB TAB Ectopic Multiple Live Births  1     0 3    # Outcome  Date GA Lbr Len/2nd Weight Sex Delivery Anes PTL Lv  4 Term 05/23/15 [redacted]w[redacted]d  7 lb 0.2 oz (3.18 kg) M CS-LTranv Gen  LIV  3 Term 07/14/14 [redacted]w[redacted]d   M CS-LTranv Spinal  LIV  2 Term 2009    M CS-LTranv EPI  LIV  1 SAB 2001            Patient denies any other pertinent gynecologic issues.         Current Outpatient Prescriptions on File Prior to Visit  Medication Sig Dispense Refill  . acetaminophen (TYLENOL) 500 MG tablet Take 500 mg by mouth as needed for mild pain.     No current facility-administered medications on file prior to visit.         Allergies  Allergen Reactions  . Claritin [Loratadine] Other (See Comments)    causes spasms  . Diphenhydramine Other (See Comments)    Causes spasms and keeps alert for days    Social History:   reports that she has been smoking Cigarettes.  She has been smoking about 0.25 packs per day. She has never used smokeless tobacco. She reports that she drinks alcohol. She reports that she does not use drugs.       Family History  Problem Relation Age of Onset  . Cancer Father   .  Hypertension Mother   . Diabetes Mother   . Autism Son     Review of Systems: Noncontributory  PHYSICAL EXAM: unknown if currently breastfeeding. General appearance - alert, well appearing, and in no distress Chest - clear to auscultation, no wheezes, rales or rhonchi, symmetric air entry Heart - normal rate and regular rhythm Extremities - peripheral pulses normal, no pedal edema, no clubbing or cyanosis Abdomen- soft, nontender, no masses or organomegaly, umbilicus with normal appearance  Labs: CBC    Component Value Date/Time   WBC 5.9 06/08/2016 1110   RBC 4.98 06/08/2016 1110   HGB 14.0 06/08/2016 1110   HCT 41.7 06/08/2016 1110   PLT 244 06/08/2016 1110   MCV 83.7 06/08/2016 1110   MCH 28.1 06/08/2016 1110   MCHC 33.6 06/08/2016 1110   RDW 13.1 06/08/2016 1110   LYMPHSABS 1.9 04/11/2016 1310   MONOABS 0.3  04/11/2016 1310   EOSABS 0.2 04/11/2016 1310   BASOSABS 0.0 04/11/2016 1310   BMET    Component Value Date/Time   NA 138 06/08/2016 1110   K 4.2 06/08/2016 1110   CL 107 06/08/2016 1110   CO2 24 06/08/2016 1110   GLUCOSE 101 (H) 06/08/2016 1110   BUN 11 06/08/2016 1110   CREATININE 0.63 06/08/2016 1110   CALCIUM 8.9 06/08/2016 1110   GFRNONAA >60 06/08/2016 1110   GFRAA >60 06/08/2016 1110  Urine dipstick shows negative for all components.  Micro exam: negative for WBC's or RBC's. Serum HCG negative on 9/22    Imaging Studies: ImagingResults  No results found.    Assessment:     Patient Active Problem List   Diagnosis Date Noted  . Well woman exam with routine gynecological exam 04/30/2016  . Encounter for contraceptive management 04/30/2016  . S/P cesarean section 07/14/2014    Plan: 1. Patient will undergo surgical management with bilateral salpingectomy.  2. Pt strongly encouraged to exclusively use condoms until procedure or practice abstinence  3. Will obtain serum HCG - NEGATIVE   .mec 05/30/2016 10:19 AM    By signing my name below, I, Hansel Feinstein, attest that this documentation has been prepared under the direction and in the presence of Jonnie Kind, MD. Electronically Signed: Hansel Feinstein, ED Scribe. 05/30/16. 10:23 AM.  I personally performed the services described in this documentation, which was SCRIBED in my presence. The recorded information has been reviewed and considered accurate. It has been edited as necessary during review. Jonnie Kind, MD

## 2016-06-15 ENCOUNTER — Telehealth: Payer: Self-pay | Admitting: Obstetrics and Gynecology

## 2016-06-15 NOTE — Telephone Encounter (Signed)
Pt c/o sharp back pain since her surgery on 06/15/2016. Call transferred to Dr. Glo Herring.

## 2016-06-15 NOTE — Telephone Encounter (Signed)
Pt called stating that she has surgery this past Tuesday and is experiencing a lot of lower back pain. Pt would like to know if this is normal. Please contact pt

## 2016-06-18 ENCOUNTER — Encounter (HOSPITAL_COMMUNITY): Payer: Self-pay | Admitting: Obstetrics and Gynecology

## 2016-06-22 ENCOUNTER — Encounter: Payer: Medicaid Other | Admitting: Obstetrics and Gynecology

## 2016-06-25 ENCOUNTER — Encounter: Payer: Self-pay | Admitting: Obstetrics and Gynecology

## 2016-06-25 ENCOUNTER — Ambulatory Visit (INDEPENDENT_AMBULATORY_CARE_PROVIDER_SITE_OTHER): Payer: Self-pay | Admitting: Obstetrics and Gynecology

## 2016-06-25 VITALS — BP 128/84 | HR 72 | Ht 59.0 in | Wt 207.6 lb

## 2016-06-25 DIAGNOSIS — Z09 Encounter for follow-up examination after completed treatment for conditions other than malignant neoplasm: Secondary | ICD-10-CM | POA: Insufficient documentation

## 2016-06-25 DIAGNOSIS — Z9889 Other specified postprocedural states: Secondary | ICD-10-CM

## 2016-06-25 DIAGNOSIS — Z302 Encounter for sterilization: Secondary | ICD-10-CM

## 2016-06-25 NOTE — Progress Notes (Signed)
  Subjective:  Christina [name] is a 40 y.o. female now 2 weeks status post bilateral salpingectomy . She has no complaints at this time.     Review of Systems Negative    Diet:   normal   Bowel movements : normal.  The patient is not having any pain.  Objective:  BP 128/84   Pulse 72   Ht 4\' 11"  (1.499 m)   Wt 207 lb 9.6 oz (94.2 kg)   LMP 06/16/2016   BMI 41.93 kg/m  General:Well developed, well nourished.  No acute distress. Abdomen: Bowel sounds normal, soft, non-tender. Umbilicus well healed. RLQ incision has fibrosis at scar. Patient advised to notify us if any stitch visible  Pelvic Exam: not indicated   Incision(s):   Healing well, no drainage, no erythema, no hernia, no swelling, no dehiscence     Assessment:  Post-Op 2 weeks s/p bilateral salpingectomy  Normal healing     Doing well postoperatively.   Plan:  1.Wound care discussed   2. . current medications. 3. Activity restrictions: none 4. return to work: now. 5. Follow up in 1 year.  By signing my name below, I, Sonum Patel, attest that this documentation has been prepared under the direction and in the presence of Jonnie Kind, MD. Electronically Signed: Sonum Patel, Education administrator. 06/25/16. 10:46 AM.  I personally performed the services described in this documentation, which was SCRIBED in my presence. The recorded information has been reviewed and considered accurate. It has been edited as necessary during review. Jonnie Kind, MD

## 2016-07-02 NOTE — Telephone Encounter (Signed)
Pt now 07/02/16 s/p salpingectomy , with no known outstanding issues. Prior concerns apparently resolved.

## 2016-12-01 ENCOUNTER — Emergency Department (HOSPITAL_COMMUNITY)
Admission: EM | Admit: 2016-12-01 | Discharge: 2016-12-01 | Disposition: A | Discharge status: Home / Self Care | Payer: Medicaid Other | Attending: Dermatology | Admitting: Dermatology

## 2016-12-01 ENCOUNTER — Encounter (HOSPITAL_COMMUNITY): Payer: Self-pay

## 2016-12-01 DIAGNOSIS — F17210 Nicotine dependence, cigarettes, uncomplicated: Secondary | ICD-10-CM | POA: Insufficient documentation

## 2016-12-01 DIAGNOSIS — I10 Essential (primary) hypertension: Secondary | ICD-10-CM | POA: Insufficient documentation

## 2016-12-01 DIAGNOSIS — Z5321 Procedure and treatment not carried out due to patient leaving prior to being seen by health care provider: Secondary | ICD-10-CM | POA: Insufficient documentation

## 2016-12-01 DIAGNOSIS — R109 Unspecified abdominal pain: Secondary | ICD-10-CM | POA: Insufficient documentation

## 2016-12-01 LAB — URINALYSIS, ROUTINE W REFLEX MICROSCOPIC
Bilirubin Urine: NEGATIVE
Glucose, UA: NEGATIVE mg/dL
KETONES UR: NEGATIVE mg/dL
LEUKOCYTES UA: NEGATIVE
Nitrite: NEGATIVE
Protein, ur: NEGATIVE mg/dL
SPECIFIC GRAVITY, URINE: 1.019 (ref 1.005–1.030)
pH: 5 (ref 5.0–8.0)

## 2016-12-01 NOTE — ED Triage Notes (Signed)
Having abdominal pain that started on Tuesday morning.  Started vomiting on Wednesday.  Felt better on Thursday.  Started having abdominal swelling last night that became worse throughout the day.  Took Tylenol #3 today and it did not help with the pain.  Drank some flax seed oil in case I was constipated and it did not do anything.  Last bowel movement was Monday night and it was diarrhea.  Started period on Tuesday and stopped bleeding yesterday.  Pain became worse after period stopped.  Was having back pain when my period stopped and pain moved to the front.

## 2016-12-01 NOTE — ED Notes (Addendum)
Patient stated she was "going to [place], there is only a 20 minute wait time". Patient then went to lobby and told others sitting in lobby that "[facility] has a 20 minute wait".  Patient left department ambulatory with steady gait.

## 2016-12-03 ENCOUNTER — Emergency Department (HOSPITAL_COMMUNITY)
Admission: EM | Admit: 2016-12-03 | Discharge: 2016-12-03 | Disposition: A | Discharge status: Home / Self Care | Payer: Medicaid Other | Attending: Emergency Medicine | Admitting: Emergency Medicine

## 2016-12-03 ENCOUNTER — Encounter (HOSPITAL_COMMUNITY): Payer: Self-pay | Admitting: Emergency Medicine

## 2016-12-03 DIAGNOSIS — R1033 Periumbilical pain: Secondary | ICD-10-CM | POA: Diagnosis present

## 2016-12-03 DIAGNOSIS — F17210 Nicotine dependence, cigarettes, uncomplicated: Secondary | ICD-10-CM | POA: Diagnosis not present

## 2016-12-03 DIAGNOSIS — I10 Essential (primary) hypertension: Secondary | ICD-10-CM | POA: Diagnosis not present

## 2016-12-03 DIAGNOSIS — K439 Ventral hernia without obstruction or gangrene: Secondary | ICD-10-CM | POA: Diagnosis not present

## 2016-12-03 LAB — CBC
HCT: 42.5 % (ref 36.0–46.0)
Hemoglobin: 14.5 g/dL (ref 12.0–15.0)
MCH: 28.9 pg (ref 26.0–34.0)
MCHC: 34.1 g/dL (ref 30.0–36.0)
MCV: 84.8 fL (ref 78.0–100.0)
Platelets: 282 10*3/uL (ref 150–400)
RBC: 5.01 MIL/uL (ref 3.87–5.11)
RDW: 13.2 % (ref 11.5–15.5)
WBC: 7.7 10*3/uL (ref 4.0–10.5)

## 2016-12-03 LAB — COMPREHENSIVE METABOLIC PANEL
ALBUMIN: 3.6 g/dL (ref 3.5–5.0)
ALK PHOS: 73 U/L (ref 38–126)
ALT: 25 U/L (ref 14–54)
AST: 13 U/L — ABNORMAL LOW (ref 15–41)
Anion gap: 7 (ref 5–15)
BUN: 11 mg/dL (ref 6–20)
CALCIUM: 9 mg/dL (ref 8.9–10.3)
CO2: 27 mmol/L (ref 22–32)
Chloride: 107 mmol/L (ref 101–111)
Creatinine, Ser: 0.62 mg/dL (ref 0.44–1.00)
GFR calc Af Amer: >60 mL/min (ref >60)
GFR calc non Af Amer: >60 mL/min (ref >60)
Glucose, Bld: 114 mg/dL — ABNORMAL HIGH (ref 65–99)
Potassium: 3.6 mmol/L (ref 3.5–5.1)
Sodium: 141 mmol/L (ref 135–145)
Total Bilirubin: 0.5 mg/dL (ref 0.3–1.2)
Total Protein: 7.1 g/dL (ref 6.5–8.1)

## 2016-12-03 MED ORDER — KETOROLAC TROMETHAMINE 30 MG/ML IJ SOLN
30.0000 mg | Freq: Once | INTRAMUSCULAR | Status: AC
  Administered 2016-12-03: 30 mg via INTRAVENOUS
  Filled 2016-12-03: qty 1

## 2016-12-03 MED ORDER — ONDANSETRON HCL 4 MG/2ML IJ SOLN
4.0000 mg | Freq: Once | INTRAMUSCULAR | Status: AC
  Administered 2016-12-03: 4 mg via INTRAVENOUS
  Filled 2016-12-03: qty 2

## 2016-12-03 MED ORDER — ONDANSETRON 8 MG PO TBDP: 8.0000 mg | ORAL_TABLET | Freq: Three times a day (TID) | ORAL | 0 refills | Status: DC | PRN

## 2016-12-03 MED ORDER — ACETAMINOPHEN-CODEINE #3 300-30 MG PO TABS: 1.0000 | ORAL_TABLET | Freq: Four times a day (QID) | ORAL | 0 refills | Status: DC | PRN

## 2016-12-03 NOTE — Discharge Instructions (Signed)
As discussed, call our hospital in the morning and ask for the financial department to apply for the "[facility] Program" for assistance with your medical and surgical needs.  You may take the tylenol #3 prescribed for pain relief.  This will make you drowsy - do not drive within 4 hours of taking this medication.  Return here for any worsened persistent pain, uncontrolled vomiting or fever over 100 degrees.

## 2016-12-03 NOTE — ED Provider Notes (Signed)
[facility] DEPT Provider Note   CSN: [id number] Arrival date & time: 12/03/16  1038  By signing my name below, I, Collene Leyden, attest that this documentation has been prepared under the direction and in the presence of Evalee Jefferson, PA-C. Electronically Signed: Collene Leyden, Scribe. 12/03/16. 4:54 PM.  History   Chief Complaint Chief Complaint  Patient presents with  . Abdominal Pain   HPI Comments: Christina [name] is a 41 y.o. female with a surgical history of a C-section, appendectomy, and a salpingectomy, who presents to the Emergency Department complaining of sudden-onset, constant peri-umbilical abdominal pain that began 7 days ago. Patient states she was seen at [facility] 2 days ago, in which she was told she has abdominal adhesions and a hernia. Patient was referred to a surgeon, but when she called to get an appointment she discovered she was unable to afford surgeon co-pay. She is covered by her childrens medicaid only and was advised by her medicaid case manager to come here for financial assistance to get her surgery. Patient reports having a low grade fever (99.0, stating her "normal" temp is 97.4 so 99.0 is a fever).  Patient also reports having a protruding mass in her lower pelvic area when seen at [facility] which has since improved.  Patient states she was prescribed ultram and pain medication with mild relief. Her pain is worse with movement and better when lying down and states her pain is better but still present since she was seen at [facility].  She reports nausea, denies vomiting, loss of bowel/bladder control, urinary symptoms, shortness of breath, or chest pain.  LNMP was 11/26/16.   Patient states she was expecting to be able to have her surgery today so did not eat or drink since 6 PM yesterday.   The history is provided by the patient. No language interpreter was used.    Past Medical History:  Diagnosis Date  . Anxiety   . Depression   . Gestational  diabetes mellitus, antepartum 2015   managed with diet  . Hypertension    on meds until wt loss  . PONV (postoperative nausea and vomiting)   . Seizures ([place]) 2001   stressed induced; 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    Patient Active Problem List   Diagnosis Date Noted  . Postop check 06/25/2016  . Encounter for sterilization 06/12/2016  . Well woman exam with routine gynecological exam 04/30/2016  . Encounter for contraceptive management 04/30/2016  . S/P cesarean section 07/14/2014    Past Surgical History:  Procedure Laterality Date  . APPENDECTOMY    . CESAREAN SECTION    . CESAREAN SECTION N/A 07/14/2014   Procedure: CESAREAN SECTION;  Surgeon: Cheri Fowler, MD;  Location: [facility];  Service: Obstetrics;  Laterality: N/A;  . CESAREAN SECTION N/A 05/23/2015   Procedure: CESAREAN SECTION;  Surgeon: Cheri Fowler, MD;  Location: [facility];  Service: Obstetrics;  Laterality: N/A;  . LAPAROSCOPIC BILATERAL SALPINGECTOMY Bilateral 06/12/2016   Procedure: LAPAROSCOPIC BILATERAL SALPINGECTOMY;  Surgeon: Jonnie Kind, MD;  Location: [facility];  Service: Gynecology;  Laterality: Bilateral;    OB History    Gravida Para Term Preterm AB Living   4 3 3   1 3    SAB TAB Ectopic Multiple Live Births   1     0 3       Home Medications    Prior to Admission medications   Medication Sig Start Date End Date Taking? Authorizing Provider  acetaminophen (  TYLENOL) 500 MG tablet Take 500 mg by mouth as needed for mild pain.    Historical Provider, MD  acetaminophen-codeine (TYLENOL #3) 300-30 MG tablet Take 1 tablet by mouth every 6 (six) hours as needed for moderate pain. 12/03/16   Evalee Jefferson, PA-C  ondansetron (ZOFRAN ODT) 8 MG disintegrating tablet Take 1 tablet (8 mg total) by mouth every 8 (eight) hours as needed for nausea or vomiting. 12/03/16   Evalee Jefferson, PA-C    Family History Family History  Problem Relation Age of Onset  . Cancer Father   . Hypertension Mother   .  Diabetes Mother   . Autism Son     Social History Social History  Substance Use Topics  . Smoking status: Current Some Day Smoker    Packs/day: 0.25    Years: 12.00    Types: Cigarettes  . Smokeless tobacco: Never Used  . Alcohol use Yes     Comment: rarely; not during preg     Allergies   Claritin [loratadine] and Diphenhydramine   Review of Systems Review of Systems  Constitutional: Positive for fever.  HENT: Negative.   Respiratory: Negative for shortness of breath.   Cardiovascular: Negative for chest pain.  Gastrointestinal: Positive for abdominal pain and nausea. Negative for constipation, diarrhea and vomiting.  Genitourinary: Negative for difficulty urinating.  Musculoskeletal: Negative.      Physical Exam Updated Vital Signs BP (!) 142/80 (BP Location: Left Arm)   Pulse 67   Temp 98.5 F (36.9 C) (Oral)   Resp 18   Ht 4\' 11"  (1.499 m)   Wt 92.5 kg   LMP 11/27/2016   SpO2 99%   BMI 41.20 kg/m   Physical Exam  Constitutional: She is oriented to person, place, and time. She appears well-developed.  HENT:  Head: Normocephalic and atraumatic.  Mouth/Throat: Oropharynx is clear and moist.  Eyes: Conjunctivae and EOM are normal. Pupils are equal, round, and reactive to light.  Neck: Normal range of motion. Neck supple.  Cardiovascular: Normal rate and regular rhythm.  Exam reveals no gallop and no friction rub.   No murmur heard. Pulmonary/Chest: Effort normal and breath sounds normal. She has no wheezes. She has no rales.  Abdominal: Soft. Bowel sounds are normal. She exhibits no distension and no mass. There is tenderness. There is no rebound and no guarding. No hernia.  Patient has an adipose abdomen. Tender in the suprapubic region without guarding. No palpable mass. Well healed C-section incision line.   Musculoskeletal: Normal range of motion.  Neurological: She is alert and oriented to person, place, and time.  Skin: Skin is warm and dry.    Psychiatric: She has a normal mood and affect.  Nursing note and vitals reviewed.    ED Treatments / Results  DIAGNOSTIC STUDIES: Oxygen Saturation is 98% on RA, normal by my interpretation.    COORDINATION OF CARE: 4:54 PM Discussed treatment plan with pt at bedside and pt agreed to plan, which includes tramadol and Zofran.   Labs (all labs ordered are listed, but only abnormal results are displayed) Labs Reviewed  COMPREHENSIVE METABOLIC PANEL - Abnormal; Notable for the following:       Result Value   Glucose, Bld 114 (*)    AST 13 (*)    All other components within normal limits  CBC    EKG  EKG Interpretation None       Radiology No results found.  CT imaging completed at [facility] early yesterday am reviewed  with Dr. Thornton Papas of radiology.  There was a fat containing hernia low midline abd/pelvis, trapped between bilateral abdominal rectus musculature. No adhesions or intestinal obstruction noted.   Procedures Procedures (including critical care time)  Medications Ordered in ED Medications  ketorolac (TORADOL) 30 MG/ML injection 30 mg (30 mg Intravenous Given 12/03/16 1736)  ondansetron (ZOFRAN) injection 4 mg (4 mg Intravenous Given 12/03/16 1736)     Initial Impression / Assessment and Plan / ED Course  I have reviewed the triage vital signs and the nursing notes.  Pertinent labs & imaging results that were available during my care of the patient were reviewed by me and considered in my medical decision making (see chart for details).    Prior images and labwork today reassuring, exam today is not c/w an acute abd process.  No palpable trapped hernia on todays exam.  Pt was given referral to Dr. Arnoldo Morale, also given guidance/referral for obtaining financial assistance through [facility].  Discussed strict return precautions.   Discussed with Dr. Laverta Baltimore prior to dc home.  Final Clinical Impressions(s) / ED Diagnoses   Final diagnoses:  Ventral hernia without  obstruction or gangrene    New Prescriptions Discharge Medication List as of 12/03/2016  6:40 PM    START taking these medications   Details  ondansetron (ZOFRAN ODT) 8 MG disintegrating tablet Take 1 tablet (8 mg total) by mouth every 8 (eight) hours as needed for nausea or vomiting., Starting Mon 12/03/2016, Print       I personally performed the services described in this documentation, which was scribed in my presence. The recorded information has been reviewed and is accurate.     Evalee Jefferson, PA-C 12/04/16 [place], MD 12/04/16 [phone]

## 2016-12-03 NOTE — ED Triage Notes (Signed)
Pt reports was seen for same on Friday at [facility] and reports was diagnosed with "abdominal adhesions" and was referred to a surgeon. Pt reports unable to afford surgeon copay and reports was told to come here and be admitted. nad noted. Pt reports continued abd pain and reports LBM 11/26/16.

## 2017-07-20 DIAGNOSIS — F17210 Nicotine dependence, cigarettes, uncomplicated: Secondary | ICD-10-CM | POA: Insufficient documentation

## 2017-07-20 DIAGNOSIS — S8012XA Contusion of left lower leg, initial encounter: Secondary | ICD-10-CM | POA: Insufficient documentation

## 2017-07-20 DIAGNOSIS — Y939 Activity, unspecified: Secondary | ICD-10-CM | POA: Insufficient documentation

## 2017-07-20 DIAGNOSIS — I10 Essential (primary) hypertension: Secondary | ICD-10-CM | POA: Insufficient documentation

## 2017-07-20 DIAGNOSIS — W010XXA Fall on same level from slipping, tripping and stumbling without subsequent striking against object, initial encounter: Secondary | ICD-10-CM | POA: Insufficient documentation

## 2017-07-20 DIAGNOSIS — Y999 Unspecified external cause status: Secondary | ICD-10-CM | POA: Insufficient documentation

## 2017-07-20 DIAGNOSIS — Y929 Unspecified place or not applicable: Secondary | ICD-10-CM | POA: Insufficient documentation

## 2017-07-21 ENCOUNTER — Emergency Department (HOSPITAL_COMMUNITY)
Admission: EM | Admit: 2017-07-21 | Discharge: 2017-07-21 | Disposition: A | Discharge status: Home / Self Care | Payer: Self-pay | Attending: Emergency Medicine | Admitting: Emergency Medicine

## 2017-07-21 ENCOUNTER — Emergency Department (HOSPITAL_COMMUNITY): Payer: Self-pay

## 2017-07-21 ENCOUNTER — Encounter (HOSPITAL_COMMUNITY): Payer: Self-pay | Admitting: *Deleted

## 2017-07-21 DIAGNOSIS — W010XXA Fall on same level from slipping, tripping and stumbling without subsequent striking against object, initial encounter: Secondary | ICD-10-CM

## 2017-07-21 DIAGNOSIS — S8012XA Contusion of left lower leg, initial encounter: Secondary | ICD-10-CM

## 2017-07-21 DIAGNOSIS — W19XXXA Unspecified fall, initial encounter: Secondary | ICD-10-CM

## 2017-07-21 MED ORDER — TRAMADOL HCL 50 MG PO TABS: 50.0000 mg | ORAL_TABLET | Freq: Four times a day (QID) | ORAL | 0 refills | Status: DC | PRN

## 2017-07-21 NOTE — ED Provider Notes (Signed)
[facility] EMERGENCY DEPARTMENT Provider Note   CSN: [id number] Arrival date & time: 07/20/17  2340     History   Chief Complaint Chief Complaint  Patient presents with  . Ankle Pain    HPI Christina [name] is a 41 y.o. female.  The history is provided by the patient.  She tripped over a curb 3 days ago, injuring her left lower leg.  She is complaining of pain just above her left ankle.  She did apply ice and take some ibuprofen.  She is only able to bear weight on a limited basis.  She currently rates pain at 10/10.  She denies other injury in the fall.   Past Medical History:  Diagnosis Date  . Anxiety   . Depression   . Gestational diabetes mellitus, antepartum 2015   managed with diet  . Hypertension    on meds until wt loss  . PONV (postoperative nausea and vomiting)   . Seizures ([place]) 2001   stressed induced; 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    Patient Active Problem List   Diagnosis Date Noted  . Postop check 06/25/2016  . Encounter for sterilization 06/12/2016  . Well woman exam with routine gynecological exam 04/30/2016  . Encounter for contraceptive management 04/30/2016  . S/P cesarean section 07/14/2014    Past Surgical History:  Procedure Laterality Date  . APPENDECTOMY    . CESAREAN SECTION    . CESAREAN SECTION N/A 07/14/2014   Procedure: CESAREAN SECTION;  Surgeon: Cheri Fowler, MD;  Location: [facility];  Service: Obstetrics;  Laterality: N/A;  . CESAREAN SECTION N/A 05/23/2015   Procedure: CESAREAN SECTION;  Surgeon: Cheri Fowler, MD;  Location: [facility];  Service: Obstetrics;  Laterality: N/A;  . LAPAROSCOPIC BILATERAL SALPINGECTOMY Bilateral 06/12/2016   Procedure: LAPAROSCOPIC BILATERAL SALPINGECTOMY;  Surgeon: Jonnie Kind, MD;  Location: [facility];  Service: Gynecology;  Laterality: Bilateral;    OB History    Gravida Para Term Preterm AB Living   4 3 3   1 3    SAB TAB Ectopic Multiple Live Births   1     0 3       Home  Medications    Prior to Admission medications   Medication Sig Start Date End Date Taking? Authorizing Provider  acetaminophen (TYLENOL) 500 MG tablet Take 500 mg by mouth as needed for mild pain.    [provider]    Family History Family History  Problem Relation Age of Onset  . Cancer Father   . Hypertension Mother   . Diabetes Mother   . Autism Son     Social History Social History  Substance Use Topics  . Smoking status: Current Some Day Smoker    Packs/day: 0.25    Years: 12.00    Types: Cigarettes  . Smokeless tobacco: Never Used  . Alcohol use Yes     Comment: rarely; not during preg     Allergies   Claritin [loratadine] and Diphenhydramine   Review of Systems Review of Systems  All other systems reviewed and are negative.    Physical Exam Updated Vital Signs BP 115/86 (BP Location: Right Arm)   Pulse 79   Temp 98.6 F (37 C) (Oral)   Resp 18   Ht 4\' 11"  (1.499 m)   Wt 89.8 kg (198 lb)   LMP 07/07/2017   SpO2 100%   BMI 39.99 kg/m   Physical Exam  Nursing note and vitals reviewed.  [place]  year old female, resting comfortably and in no acute distress. Vital signs are normal. Oxygen saturation is 100%, which is normal. Head is normocephalic and atraumatic. PERRLA, EOMI. Oropharynx is clear. Neck is nontender and supple without adenopathy or JVD. Back is nontender and there is no CVA tenderness. Lungs are clear without rales, wheezes, or rhonchi. Chest is nontender. Heart has regular rate and rhythm without murmur. Abdomen is soft, flat, nontender without masses or hepatosplenomegaly and peristalsis is normoactive. Extremities: Ecchymosis and slight soft tissue swelling in the distal left lower leg.  No tenderness or swelling involving the medial or lateral malleolus.  There is no instability of the ankle mortise.  There is no tenderness to palpation over the proximal fibula.  Distal neurovascular exam is intact. Skin is warm and dry without  rash. Neurologic: Mental status is normal, cranial nerves are intact, there are no motor or sensory deficits.  ED Treatments / Results   Radiology Dg Tibia/fibula Left  Result Date: 07/21/2017 CLINICAL DATA:  41 year old female with fall and left lower extremity pain. EXAM: LEFT ANKLE COMPLETE - 3+ VIEW; LEFT TIBIA AND FIBULA - 2 VIEW COMPARISON:  None. FINDINGS: There is no acute fracture or dislocation. The ankle mortise is intact. No significant degenerative changes. There is a 6 mm plantar calcaneal spur. Mild subcutaneous edema. The soft tissues are otherwise unremarkable. IMPRESSION: No acute fracture or dislocation. Electronically Signed   By: Anner Crete M.D.   On: 07/21/2017 00:51   Dg Ankle Complete Left  Result Date: 07/21/2017 CLINICAL DATA:  41 year old female with fall and left lower extremity pain. EXAM: LEFT ANKLE COMPLETE - 3+ VIEW; LEFT TIBIA AND FIBULA - 2 VIEW COMPARISON:  None. FINDINGS: There is no acute fracture or dislocation. The ankle mortise is intact. No significant degenerative changes. There is a 6 mm plantar calcaneal spur. Mild subcutaneous edema. The soft tissues are otherwise unremarkable. IMPRESSION: No acute fracture or dislocation. Electronically Signed   By: Anner Crete M.D.   On: 07/21/2017 00:51    Procedures Procedures (including critical care time)  Medications Ordered in ED Medications - No data to display   Initial Impression / Assessment and Plan / ED Course  I have reviewed the triage vital signs and the nursing notes.  Pertinent labs & imaging results that were available during my care of the patient were reviewed by me and considered in my medical decision making (see chart for details).  Fall with injury to left lower leg.  X-rays have been ordered.  Old records were reviewed, and I see no relevant past visits.  X-rays are negative for fracture.  She is given crutches to use as needed, discharged with prescription for tramadol.   Advised weightbearing as tolerated.  Follow-up with PCP as needed.  Final Clinical Impressions(s) / ED Diagnoses   Final diagnoses:  Fall  Fall from slip, trip, or stumble, initial encounter  Contusion of left lower leg, initial encounter    New Prescriptions New Prescriptions   TRAMADOL (ULTRAM) 50 MG TABLET    Take 1 tablet (50 mg total) by mouth every 6 (six) hours as needed.     Delora Fuel, MD 46/80/32 [phone]

## 2017-07-21 NOTE — Discharge Instructions (Signed)
Apply ice several times a day. Weight bearing as tolerated. Use crutches as needed. Take ibuprofen or acetaminophen as needed for pain, reserve tramadol for pain not relieved by ibuprofen/acetaminophen. You may also take tramadol at night if pain is keeping you from sleeping.

## 2017-07-21 NOTE — ED Triage Notes (Signed)
Pt c/o pain to left ankle and lower leg pain; pt states she stepped off curb x 3 days ago and twisted her ankle; pt has swelling and bruising to left ankle

## 2018-02-26 IMAGING — DX DG ANKLE COMPLETE 3+V*L*
3 series · 3 of 3 positions shown · non-contrast
Comparison: None.

CLINICAL DATA: 41-year-old female with fall and left lower
extremity pain.

EXAM:
LEFT ANKLE COMPLETE - 3+ VIEW; LEFT TIBIA AND FIBULA - 2 VIEW

[ankle ap (1 of 2)]
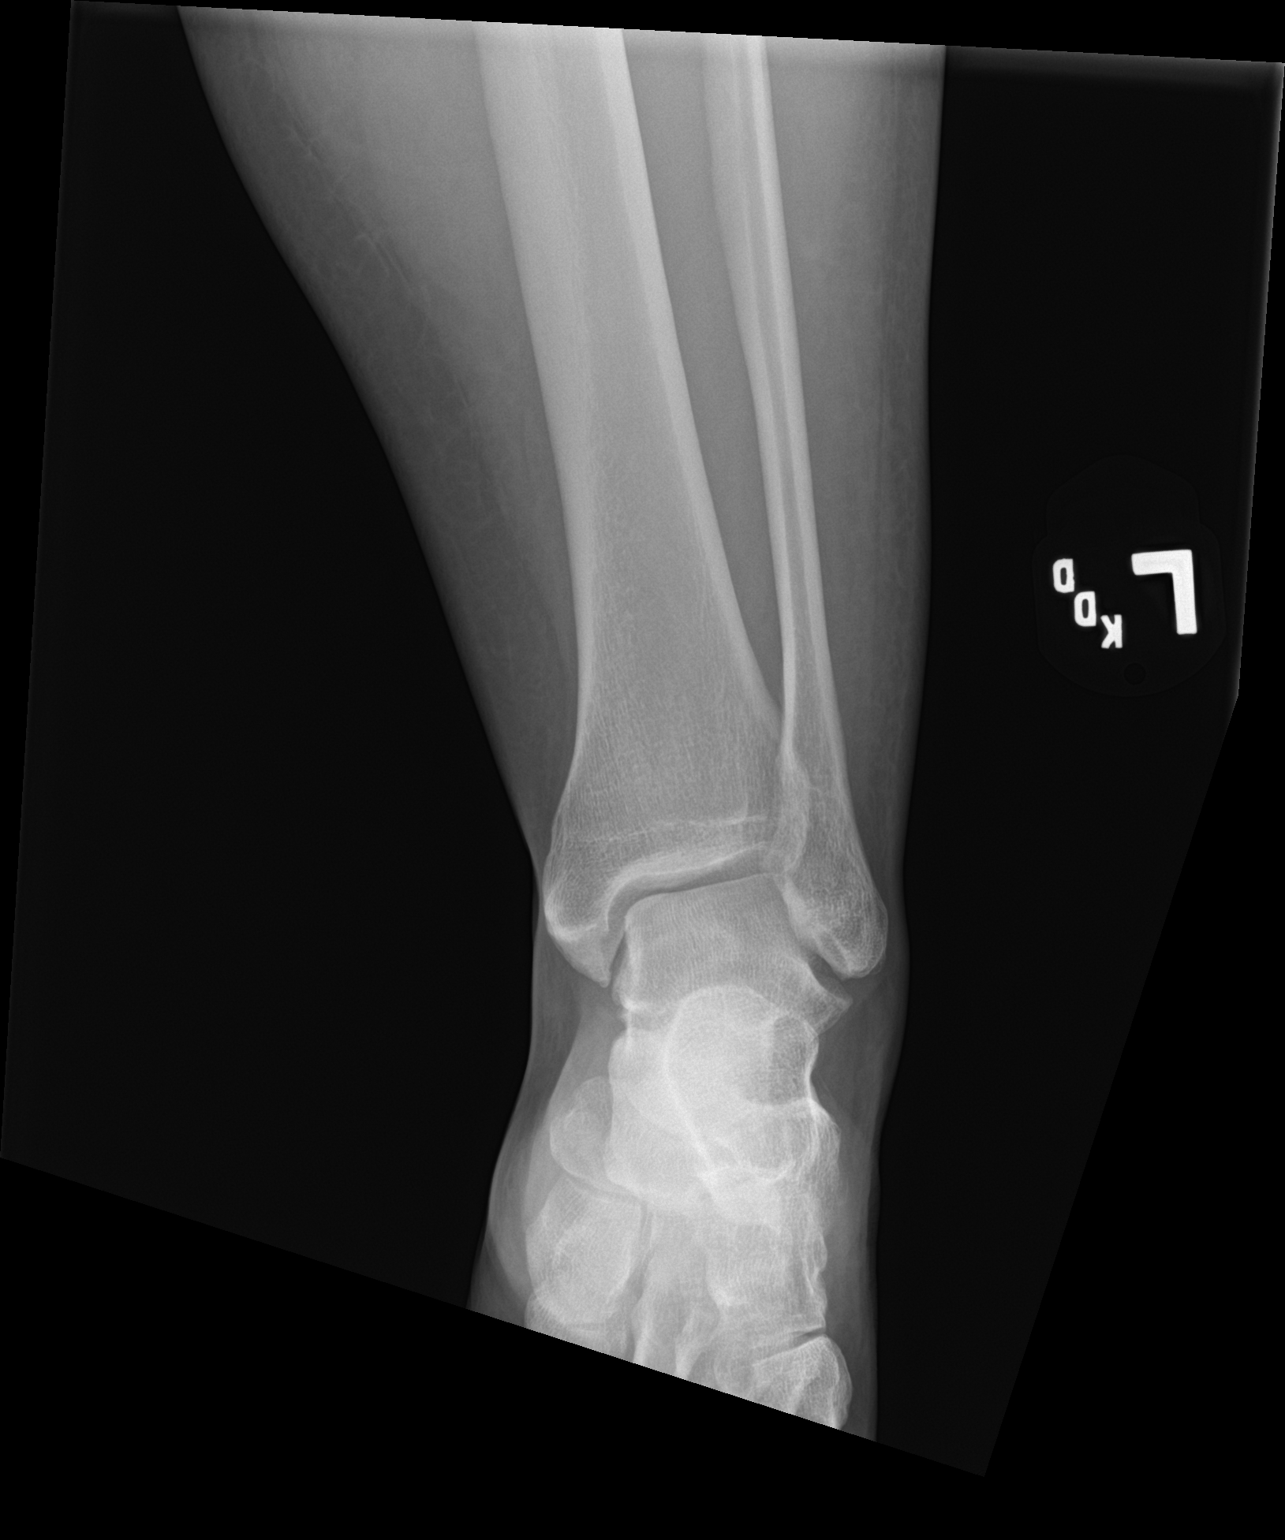

[ankle lat]
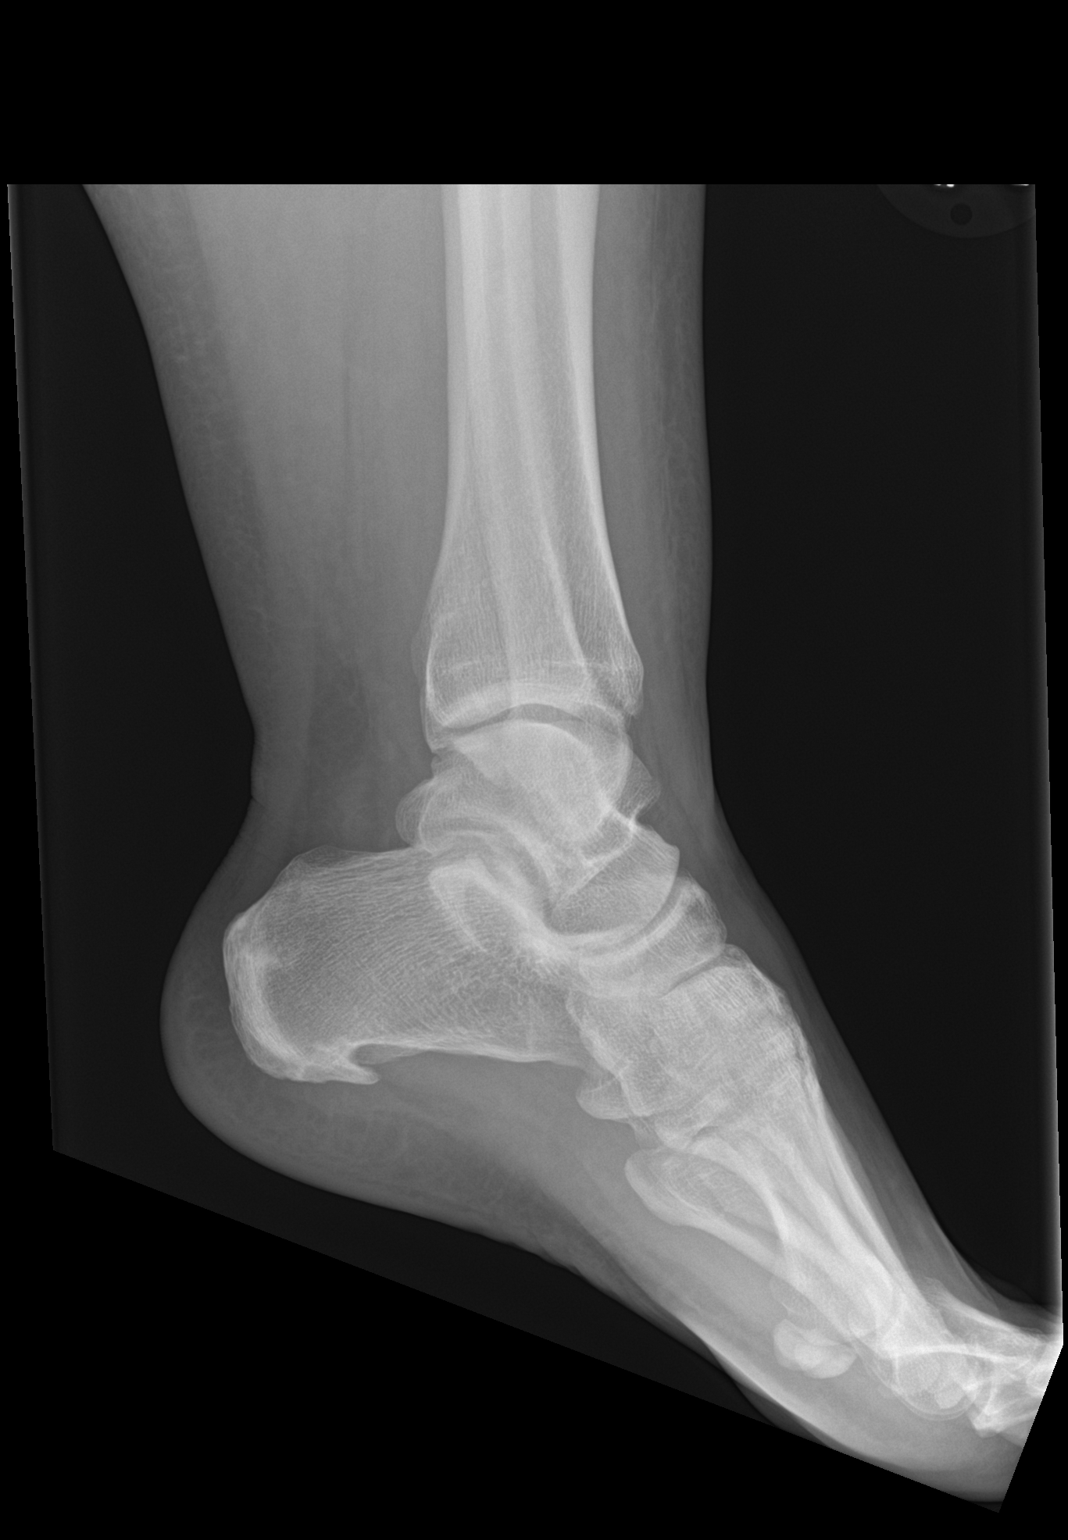

[ankle ap (2 of 2)]
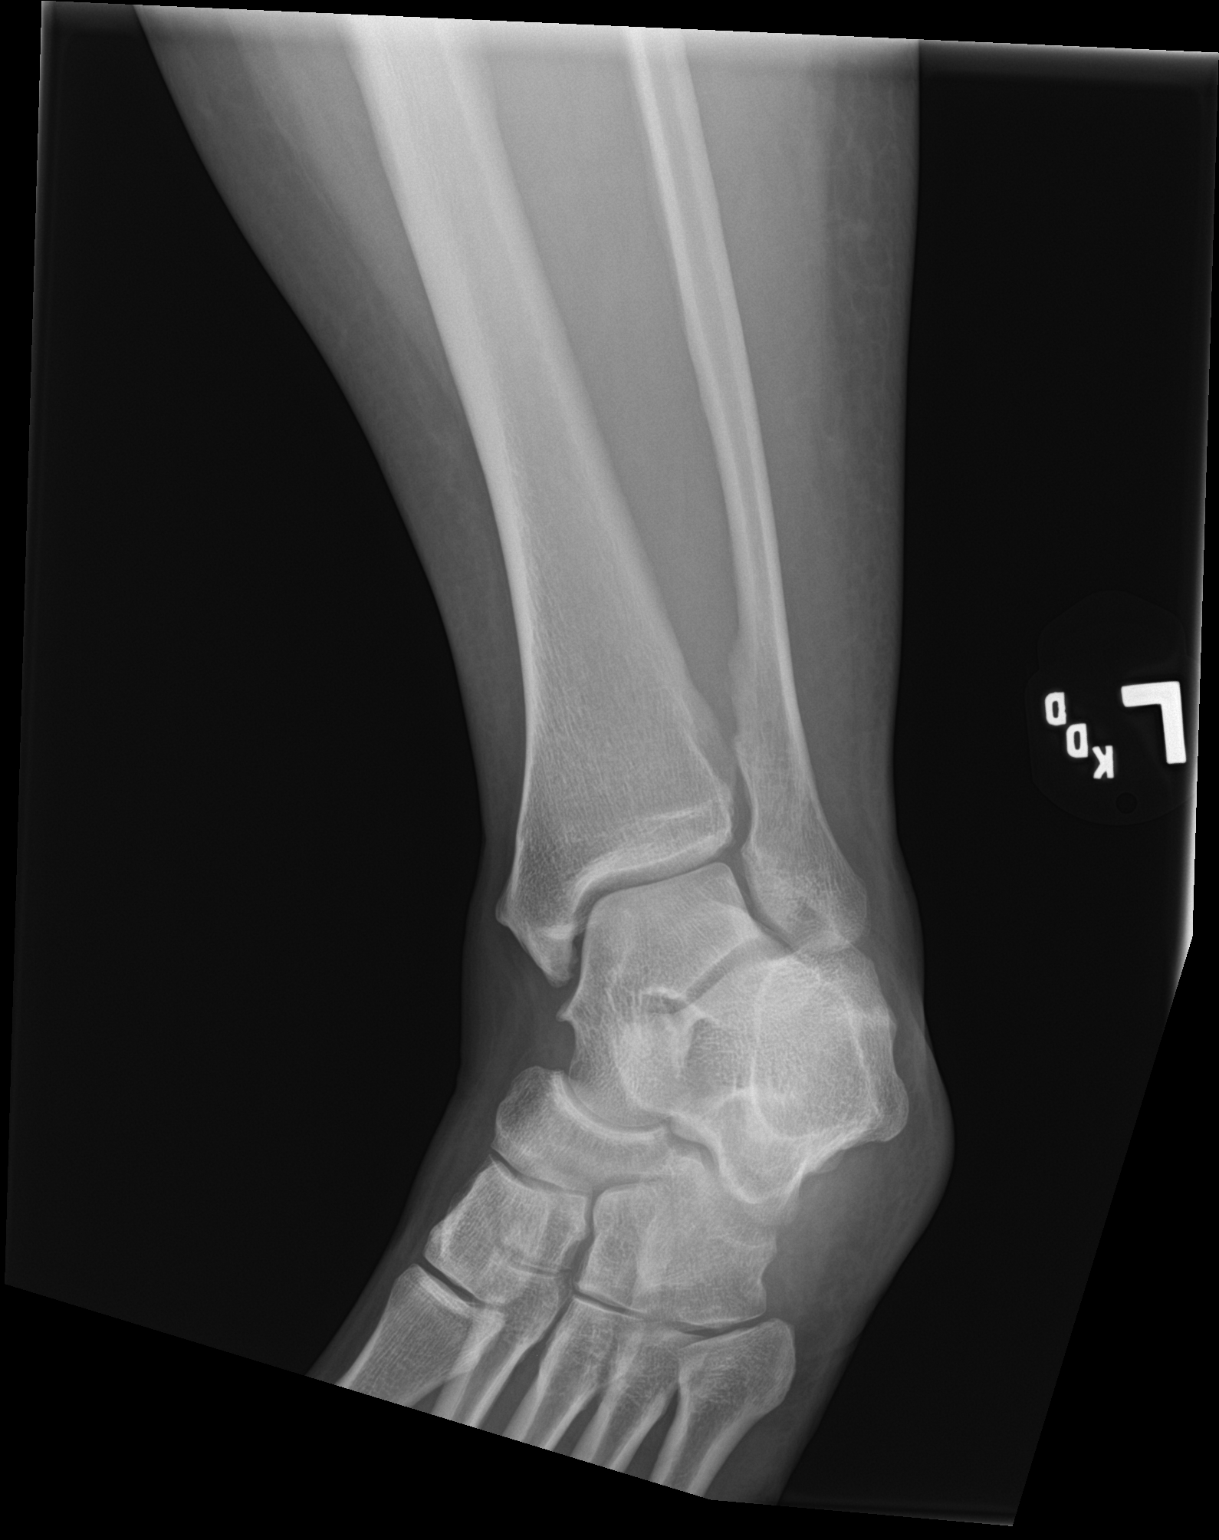

[3 of 3 positions shown; findings below may reference images not displayed]

FINDINGS: There is no acute fracture or dislocation. The ankle mortise is
intact. No significant degenerative changes. There is a 6 mm plantar
calcaneal spur. Mild subcutaneous edema. The soft tissues are
otherwise unremarkable.
IMPRESSION: No acute fracture or dislocation.

## 2018-02-26 IMAGING — DX DG TIBIA/FIBULA 2V*L*
2 series · 2 of 2 positions shown · non-contrast
Comparison: None.

CLINICAL DATA: 41-year-old female with fall and left lower
extremity pain.

EXAM:
LEFT ANKLE COMPLETE - 3+ VIEW; LEFT TIBIA AND FIBULA - 2 VIEW

[tibia ap]
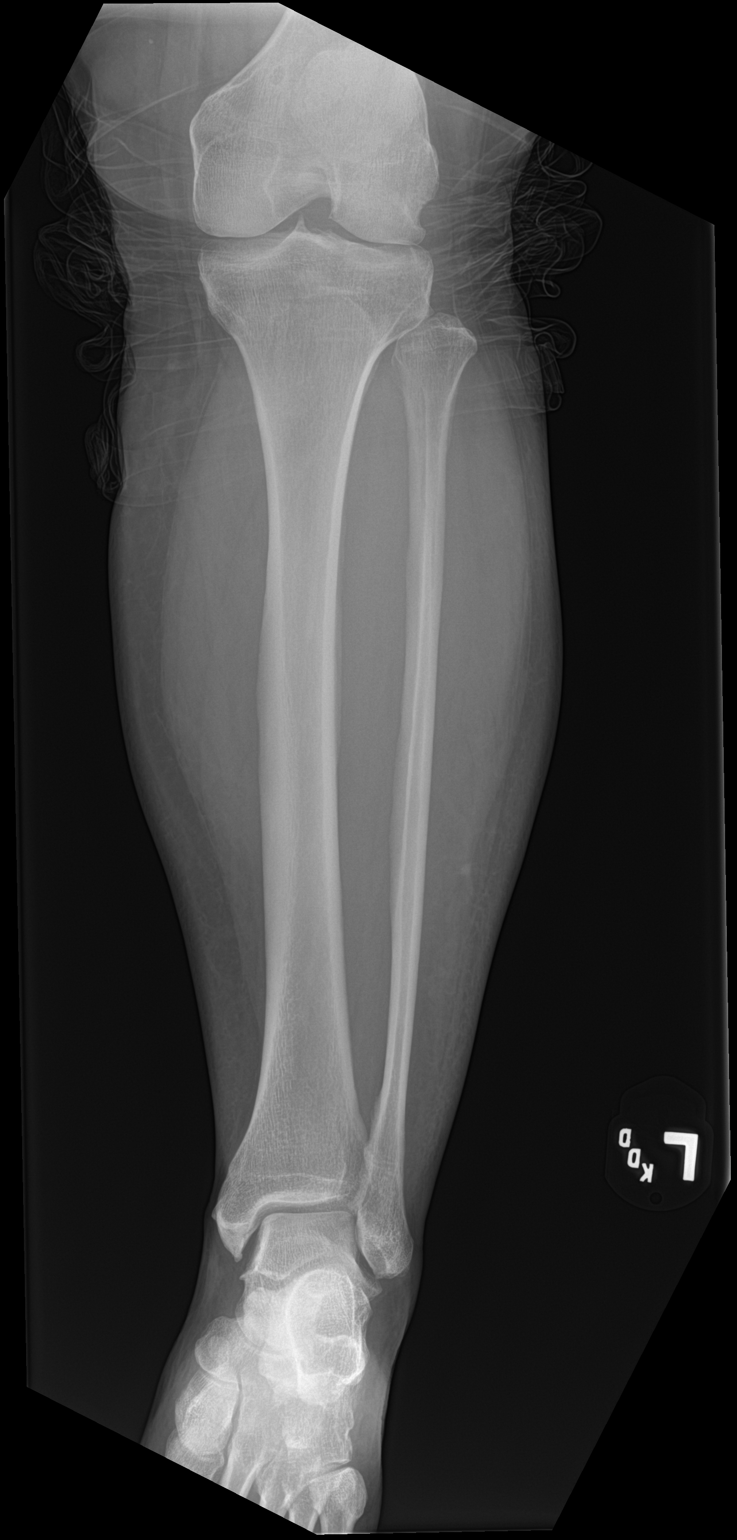

[tibia lat]
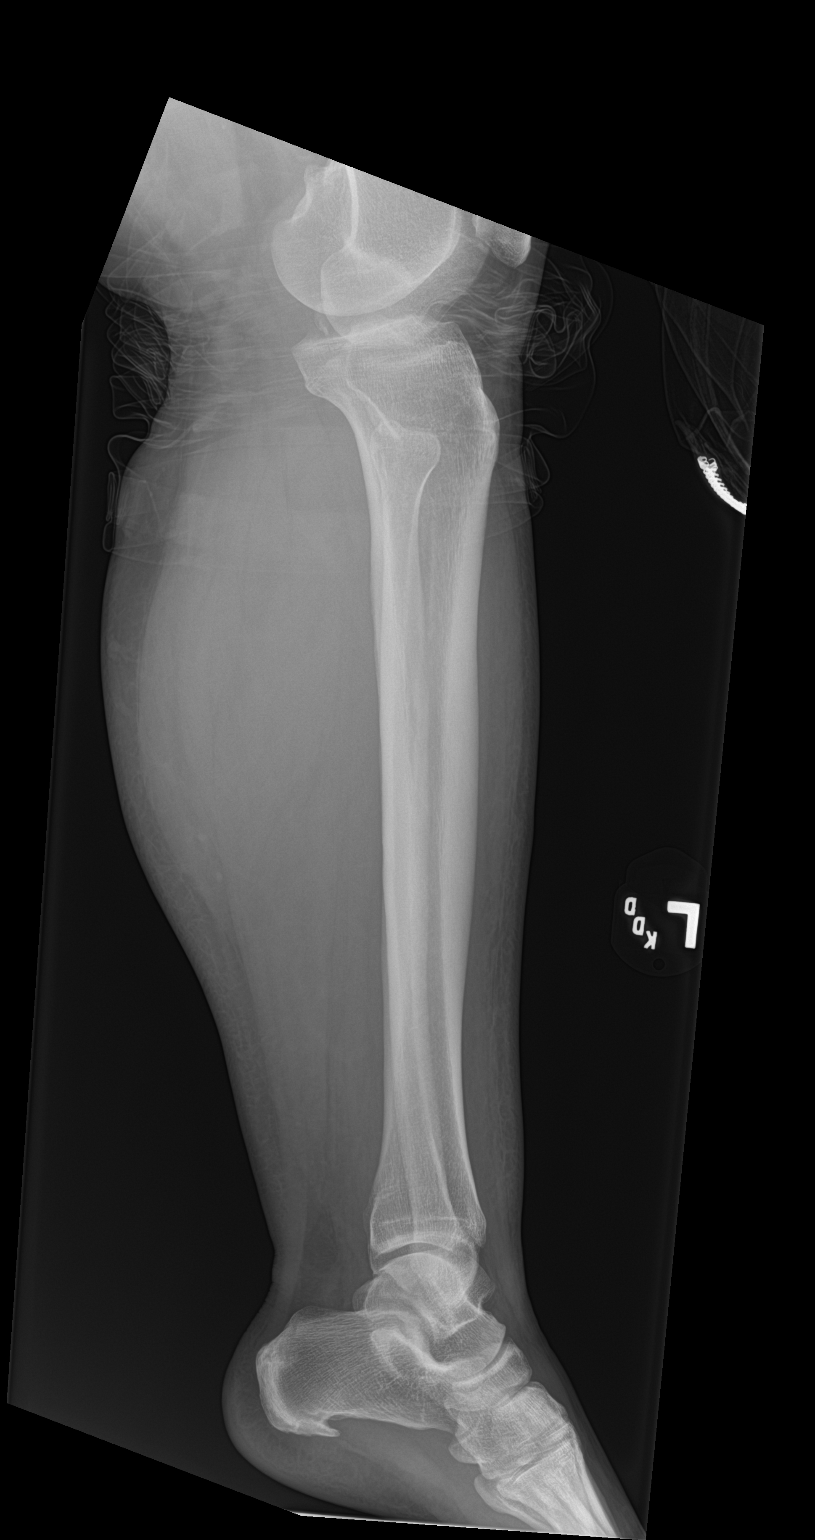

[2 of 2 positions shown; findings below may reference images not displayed]

FINDINGS: There is no acute fracture or dislocation. The ankle mortise is
intact. No significant degenerative changes. There is a 6 mm plantar
calcaneal spur. Mild subcutaneous edema. The soft tissues are
otherwise unremarkable.
IMPRESSION: No acute fracture or dislocation.

## 2021-12-12 ENCOUNTER — Encounter (HOSPITAL_COMMUNITY): Payer: Self-pay

## 2021-12-12 ENCOUNTER — Emergency Department (HOSPITAL_COMMUNITY): Payer: Medicaid Other

## 2021-12-12 ENCOUNTER — Other Ambulatory Visit: Payer: Self-pay

## 2021-12-12 ENCOUNTER — Emergency Department (HOSPITAL_COMMUNITY)
Admission: EM | Admit: 2021-12-12 | Discharge: 2021-12-12 | Disposition: A | Discharge status: Home / Self Care | Payer: Medicaid Other | Attending: Emergency Medicine | Admitting: Emergency Medicine

## 2021-12-12 DIAGNOSIS — K760 Fatty (change of) liver, not elsewhere classified: Secondary | ICD-10-CM | POA: Diagnosis not present

## 2021-12-12 DIAGNOSIS — Z20822 Contact with and (suspected) exposure to covid-19: Secondary | ICD-10-CM | POA: Diagnosis not present

## 2021-12-12 DIAGNOSIS — R110 Nausea: Secondary | ICD-10-CM | POA: Insufficient documentation

## 2021-12-12 DIAGNOSIS — R1011 Right upper quadrant pain: Secondary | ICD-10-CM | POA: Diagnosis present

## 2021-12-12 DIAGNOSIS — R059 Cough, unspecified: Secondary | ICD-10-CM | POA: Insufficient documentation

## 2021-12-12 LAB — COMPREHENSIVE METABOLIC PANEL
ALT: 24 U/L (ref 0–44)
AST: 14 U/L — ABNORMAL LOW (ref 15–41)
Albumin: 3.8 g/dL (ref 3.5–5.0)
Alkaline Phosphatase: 57 U/L (ref 38–126)
Anion gap: 7 (ref 5–15)
BUN: 20 mg/dL (ref 6–20)
CO2: 24 mmol/L (ref 22–32)
Calcium: 8.8 mg/dL — ABNORMAL LOW (ref 8.9–10.3)
Chloride: 103 mmol/L (ref 98–111)
Creatinine, Ser: 0.67 mg/dL (ref 0.44–1.00)
GFR, Estimated: >60 mL/min (ref >60)
Glucose, Bld: 133 mg/dL — ABNORMAL HIGH (ref 70–99)
Potassium: 3.7 mmol/L (ref 3.5–5.1)
Sodium: 134 mmol/L — ABNORMAL LOW (ref 135–145)
Total Bilirubin: 0.5 mg/dL (ref 0.3–1.2)
Total Protein: 7.2 g/dL (ref 6.5–8.1)

## 2021-12-12 LAB — CBC
HCT: 41.2 % (ref 36.0–46.0)
Hemoglobin: 13.9 g/dL (ref 12.0–15.0)
MCH: 29.5 pg (ref 26.0–34.0)
MCHC: 33.7 g/dL (ref 30.0–36.0)
MCV: 87.5 fL (ref 80.0–100.0)
Platelets: 274 10*3/uL (ref 150–400)
RBC: 4.71 MIL/uL (ref 3.87–5.11)
RDW: 13.2 % (ref 11.5–15.5)
WBC: 8 10*3/uL (ref 4.0–10.5)
nRBC: 0 % (ref 0.0–0.2)

## 2021-12-12 LAB — RESP PANEL BY RT-PCR (FLU A&B, COVID) ARPGX2
Influenza A by PCR: NEGATIVE
Influenza B by PCR: NEGATIVE
SARS Coronavirus 2 by RT PCR: NEGATIVE

## 2021-12-12 LAB — LIPASE, BLOOD: Lipase: 29 U/L (ref 11–51)

## 2021-12-12 LAB — D-DIMER, QUANTITATIVE: D-Dimer, Quant: 0.36 ug/mL-FEU (ref 0.00–0.50)

## 2021-12-12 MED ORDER — LACTATED RINGERS IV BOLUS
1000.0000 mL | Freq: Once | INTRAVENOUS | Status: AC
  Administered 2021-12-12: 1000 mL via INTRAVENOUS

## 2021-12-12 MED ORDER — KETOROLAC TROMETHAMINE 15 MG/ML IJ SOLN
15.0000 mg | Freq: Once | INTRAMUSCULAR | Status: AC
  Administered 2021-12-12: 15 mg via INTRAVENOUS
  Filled 2021-12-12: qty 1

## 2021-12-12 NOTE — ED Notes (Signed)
Pt ambulated to restroom w/o difficulty or pain.  ?

## 2021-12-12 NOTE — Discharge Instructions (Signed)
Your evaluated for diseases of the right upper quadrant or lower right chest.  Chest x-Malone Vanblarcom is clear.  Ultrasound shows some fatty infiltrate of your liver as discussed. ?Please return if you are having worsening symptoms especially worsening pain, fever, or inability to tolerate fluids. ?Please follow-up with your doctor as soon as possible. ?

## 2021-12-12 NOTE — ED Triage Notes (Signed)
Per EMS, Pt, from work, c/o constant upper abdominal pain and nausea starting this morning.  Pain score 9/10.  Sts SOB w/ exertion d/t pain.  Pt reports having an anxiety attach last night.  ? ?Pt able to speak full sentences.  ?

## 2021-12-12 NOTE — ED Notes (Signed)
Urine sample sent to lab to hold.  ?

## 2021-12-12 NOTE — ED Provider Notes (Signed)
?[facility] DEPT ?Provider Note ? ? ?CSN: [id number] ?Arrival date & time: 12/12/21  5427 ? ?  ? ?History ? ?Chief Complaint  ?Patient presents with  ? Abdominal Pain  ? Nausea  ? ? ?Christina [name] is a 46 y.o. female. ? ?HPI ?46 year old female presents today complaining of right upper quadrant pain that increases with deep breathing.  She has had some URI symptoms over the past week.  She reports productive cough in the morning.  She has not had any COVID testing performed.  This morning she had onset of right lower/right upper quadrant abdominal pain.  It is worse with taking a deep breath.  She also has felt very nauseated and did not eat breakfast that she felt like this would cause her to vomit.  She states her temperature at home was 99.  She has not had similar symptoms in the past.  She denies any history of DVT, PE, pneumonia, other intra-abdominal abnormalities, she has had some pain ectomy, cesarean sections, and appendectomy.  She has not had her gallbladder removed.  She denies urinary tract infection symptoms. ?Patient states she is currently menstruating and started normal menstrual cycle yesterday. ?  ? ?Home Medications ?Prior to Admission medications   ?Medication Sig Start Date End Date Taking? Authorizing Provider  ?acetaminophen (TYLENOL) 500 MG tablet Take 500 mg by mouth as needed for mild pain.    [provider]  ?traMADol (ULTRAM) 50 MG tablet Take 1 tablet (50 mg total) by mouth every 6 (six) hours as needed. 02/17/36   Delora Fuel, MD  ?   ? ?Allergies    ?Claritin [loratadine] and Diphenhydramine   ? ?Review of Systems   ?Review of Systems  ?Constitutional:  Negative for chills.  ?Gastrointestinal:  Positive for nausea. Negative for diarrhea and vomiting.  ?Genitourinary:  Negative for dysuria.  ?All other systems reviewed and are negative. ? ?Physical Exam ?Updated Vital Signs ?BP (!) 149/78   Pulse (!) 59   Temp 98 ?F (36.7 ?C) (Oral)   Resp 19    Ht 1.499 m ('4\' 11"'$ )   LMP 12/11/2021   SpO2 98%   BMI 39.99 kg/m?  ?Physical Exam ?Vitals and nursing note reviewed.  ?Constitutional:   ?   General: She is not in acute distress. ?   Appearance: She is well-developed and normal weight.  ?HENT:  ?   Head: Normocephalic and atraumatic.  ?   Mouth/Throat:  ?   Mouth: Mucous membranes are moist.  ?   Pharynx: Oropharynx is clear.  ?Eyes:  ?   Extraocular Movements: Extraocular movements intact.  ?Cardiovascular:  ?   Rate and Rhythm: Normal rate.  ?   Heart sounds: Normal heart sounds.  ?Pulmonary:  ?   Effort: Pulmonary effort is normal.  ?Abdominal:  ?   General: Abdomen is flat.  ?   Palpations: Abdomen is soft.  ?   Tenderness: There is abdominal tenderness.  ?Neurological:  ?   Mental Status: She is alert.  ? ? ?ED Results / Procedures / Treatments   ?Labs ?(all labs ordered are listed, but only abnormal results are displayed) ?Labs Reviewed  ?COMPREHENSIVE METABOLIC PANEL - Abnormal; Notable for the following components:  ?    Result Value  ? Sodium 134 (*)   ? Glucose, Bld 133 (*)   ? Calcium 8.8 (*)   ? AST 14 (*)   ? All other components within normal limits  ?RESP PANEL BY RT-PCR (  FLU A&B, COVID) [id number]  ?CBC  ?LIPASE, BLOOD  ?D-DIMER, QUANTITATIVE  ? ? ?EKG ?None ? ?Radiology ?DG Chest Port 1 View ? ?Result Date: 12/12/2021 ?CLINICAL DATA:  Cough. Right upper quadrant abdominal pain and nausea today. EXAM: PORTABLE CHEST 1 VIEW COMPARISON:  Radiographs 09/02/2012. FINDINGS: 0915 hours. The heart size and mediastinal contours are normal. The lungs are clear. There is no pleural effusion or pneumothorax. No acute osseous findings are identified. Telemetry leads overlie the chest. IMPRESSION: Stable chest.  No evidence of active cardiopulmonary process. Electronically Signed   By: Richardean Sale M.D.   On: 12/12/2021 09:22  ? ?US Abdomen Limited RUQ (LIVER/GB) ? ?Result Date: 12/12/2021 ?CLINICAL DATA:  Pain EXAM: ULTRASOUND ABDOMEN LIMITED RIGHT UPPER  QUADRANT COMPARISON:  CT examination dated December 02, 2016 FINDINGS: Gallbladder: No gallstones or wall thickening visualized. Layering material in the dependent gallbladder, likely sludge. No sonographic Murphy sign noted by sonographer. Common bile duct: Diameter: 3.6 mm Liver: No focal lesion identified. Markedly increased echogenicity of hepatic parenchyma concerning for severe steatosis. Portal vein is patent on color Doppler imaging with normal direction of blood flow towards the liver. Other: None. IMPRESSION: 1.  Severe hepatic steatosis. 2. Gallbladder sludge without evidence of gallbladder wall thickening or pericholecystic inflammatory changes. Electronically Signed   By: Keane Police D.O.   On: 12/12/2021 09:42   ? ?Procedures ?Procedures  ? ? ?Medications Ordered in ED ?Medications  ?lactated ringers bolus 1,000 mL (0 mLs Intravenous Stopped 12/12/21 0957)  ?ketorolac (TORADOL) 15 MG/ML injection 15 mg (15 mg Intravenous Given 12/12/21 0908)  ? ? ?ED Course/ Medical Decision Making/ A&P ?Clinical Course as of 12/12/21 1123  ?Tue Dec 12, 2021  ?1012 Resp Panel by RT-PCR (Flu A&B, Covid) Nasopharyngeal Swab ?Reviewed and interpreted and negative [DR]  ?1013 CBC ?Reviewed and interpreted and no acute abnormalities of white blood cell count, hemoglobin, or platelets noted [DR]  ?1013 Comprehensive metabolic panel(!) ?Reviewed and interpreted with mild hyponatremia sodium 134, mild hyperglycemia glucose 133 and mild hypocalcemia at 8.8 ?LFTs, bilirubin, potassium not elevated [DR]  ?1013 Ultrasound reviewed and interpreted and severe hepatic steatosis with gallbladder sludge without evidence of gallbladder wall thickening or pericholecystic inflammatory changes noted as radiologist interpretation [DR]  ?1014 Chest x-Anniah Glick reviewed and interpreted with no evidence of acute abnormality noted radiology interpretation concurs [DR]  ?1014 D-dimer reviewed and interpreted as normal [DR]  ?  ?Clinical Course User  Index ?[DR] [name], MD  ? ?                        ?Medical Decision Making ?MDM right upper quadrant Discussed cholecystitis, inflammation of liver including hepatitis, gastritis, other intra-abdominal etiologies including pancreatitis, right lower lung etiologies including infection, pneumonia, PE, pneumothorax, diseases of the great vessels including dissection, less likely etiologies include cardiac etiology as pain does radiate to the right side of the chest. ?Patient is being evaluated with x-Delmore Sear, EKG, labs including D-dimer, lipase, CBC, complete metabolic panel.  COVID test is ordered. ?Patient is being given IV fluids and Toradol ?All labs and imaging reviewed without any evidence of acute abnormality although hepatic steatosis is noted. ?Patient feels improved after Toradol. ?We have discussed need for outpatient follow-up, return precautions, and she will have a note for work until Friday as she does not return to Friday. ? ?Amount and/or Complexity of Data Reviewed ?Labs: ordered. Decision-making details documented in ED Course. ?Radiology: ordered. ? ?Risk ?Prescription drug management. ? ? ? ? ? ? ? ? ? ?  Final Clinical Impression(s) / ED Diagnoses ?Final diagnoses:  ?RUQ pain  ?Hepatic steatosis  ? ? ?Rx / DC Orders ?ED Discharge Orders   ? ? None  ? ?  ? ? ?  ?[name], MD ?12/12/21 1123 ? ?

## 2021-12-12 NOTE — ED Notes (Signed)
Pt shown to lobby.  Verbalized understanding discharge instructions. In no acute distress.  ? ?

## 2022-07-20 IMAGING — US US ABDOMEN LIMITED
1 series · 15 of 25 positions shown · non-contrast
Comparison: CT examination dated December 02, 2016

CLINICAL DATA: Pain

EXAM:
ULTRASOUND ABDOMEN LIMITED RIGHT UPPER QUADRANT

[Series 1: us abdomen limited ruq mc & wl · 15 of 58 slices shown]
[im 1/58]
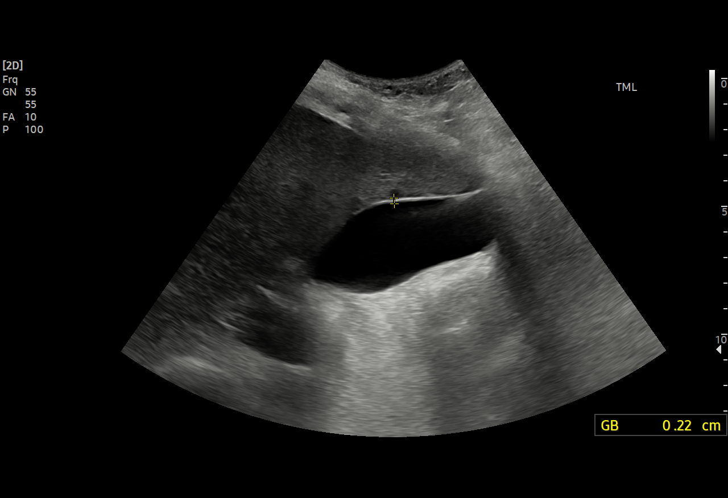
[im 5/58]
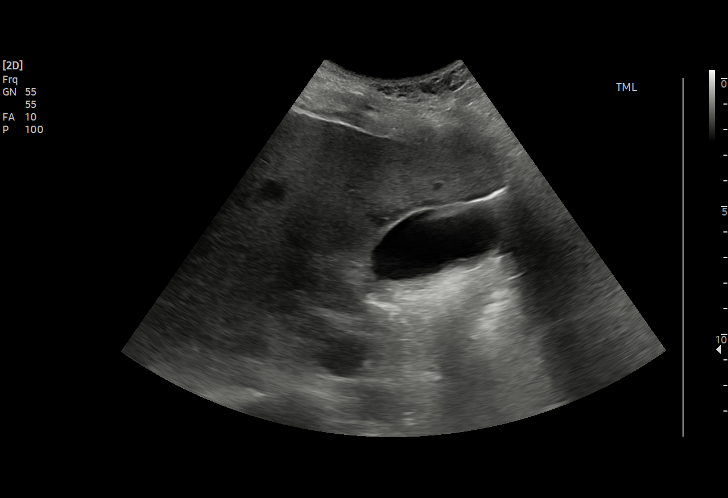
[im 10/58]
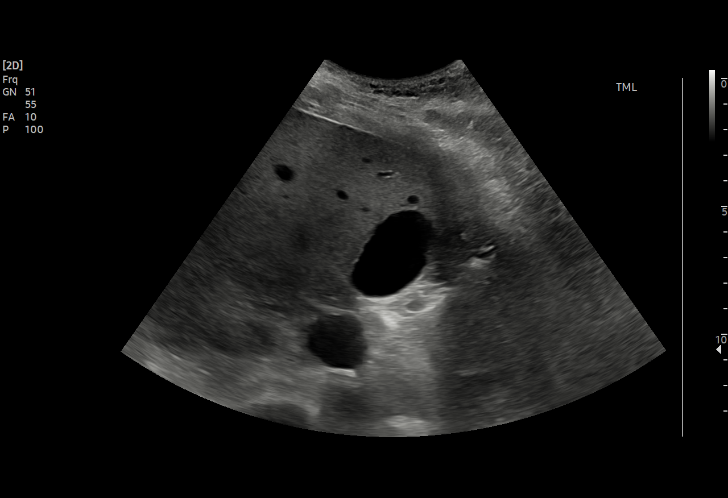
[im 12/58]
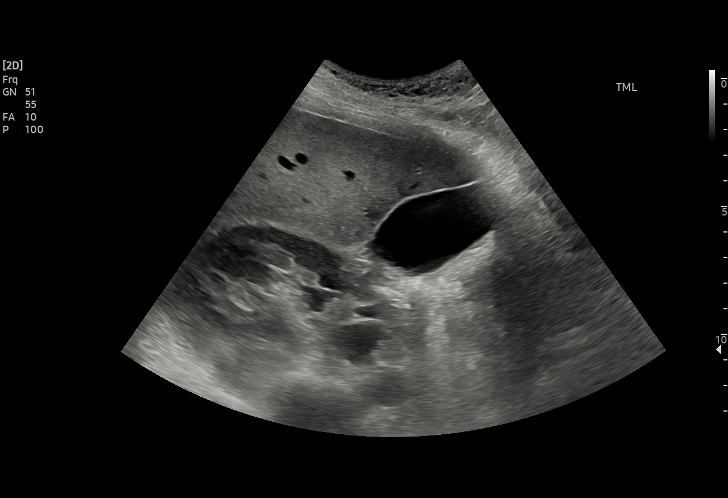
[im 17/58]
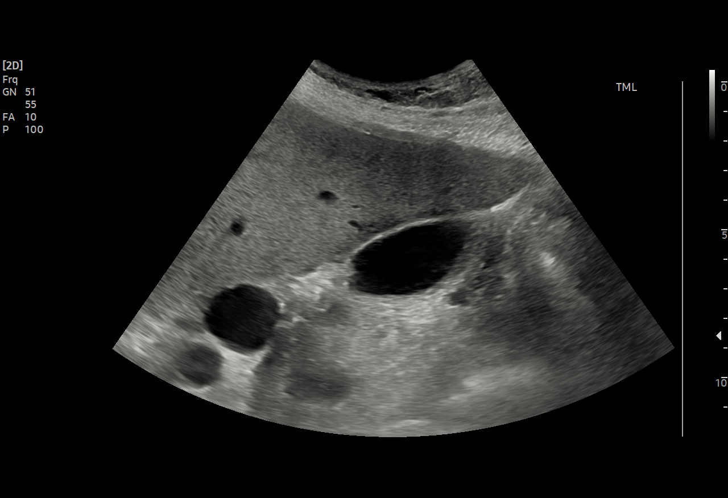
[im 22/58]
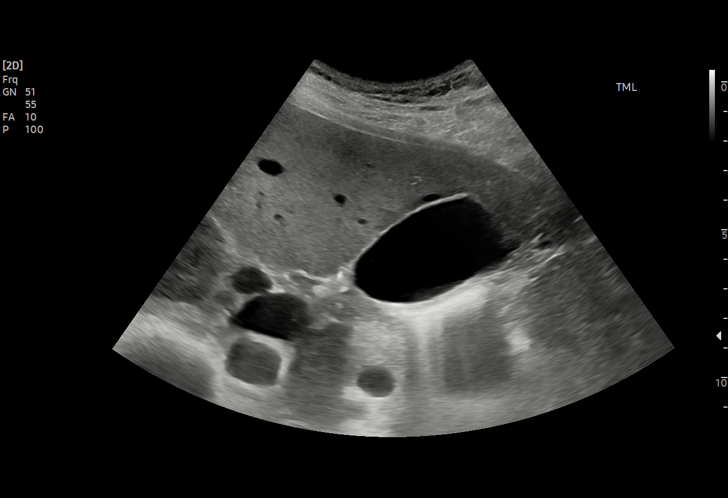
[im 24/58]
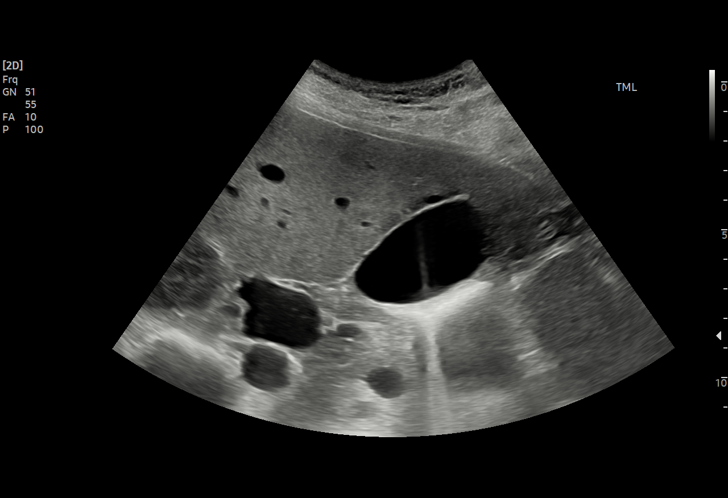
[im 29/58]
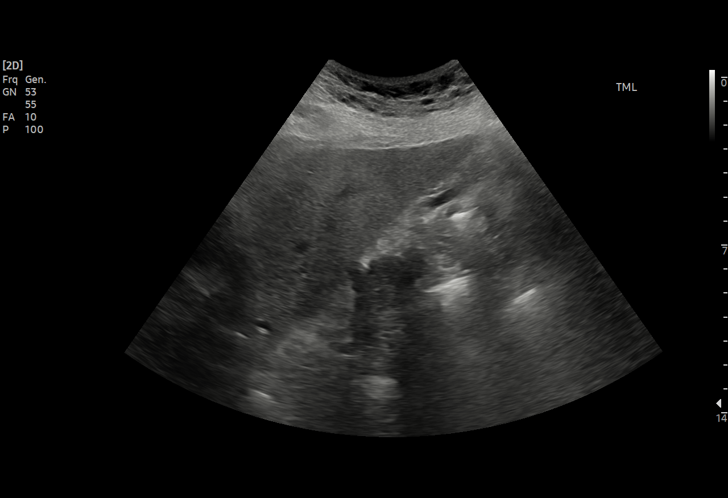
[im 34/58]
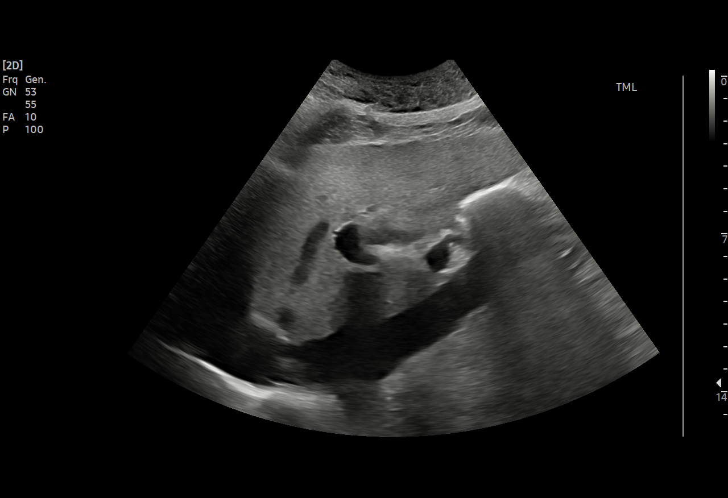
[im 36/58]
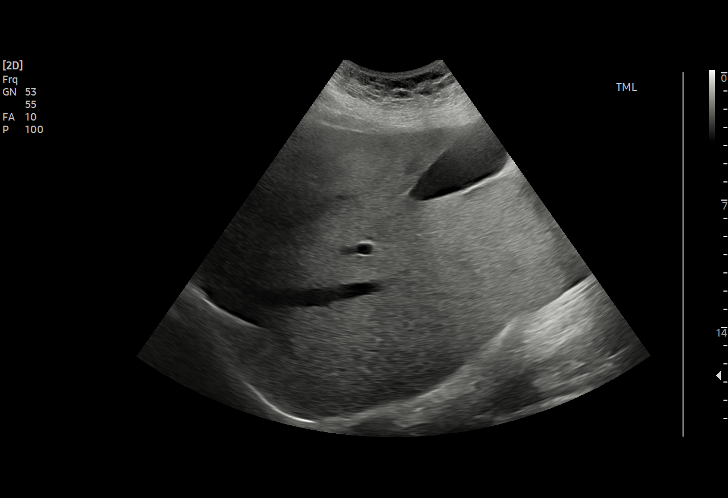
[im 41/58]
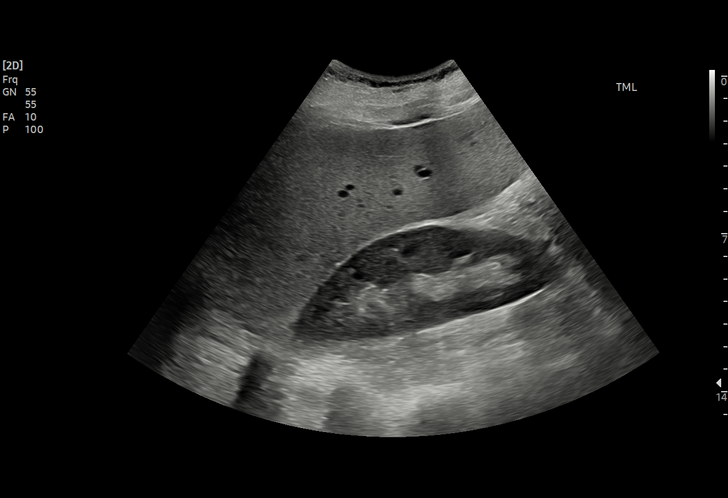
[im 46/58]
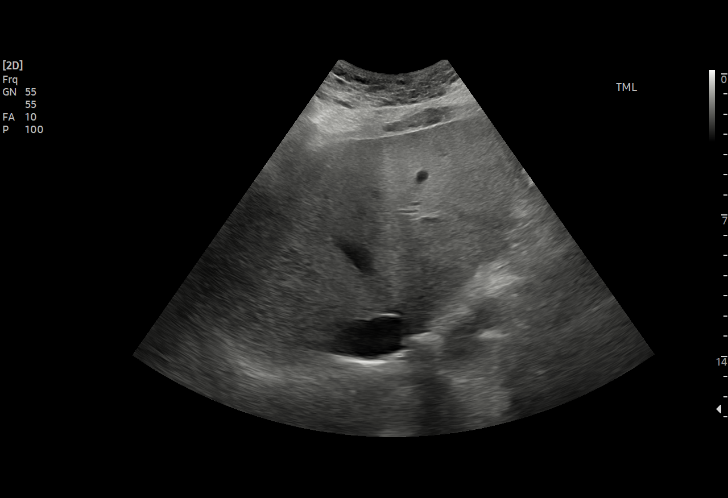
[im 48/58]
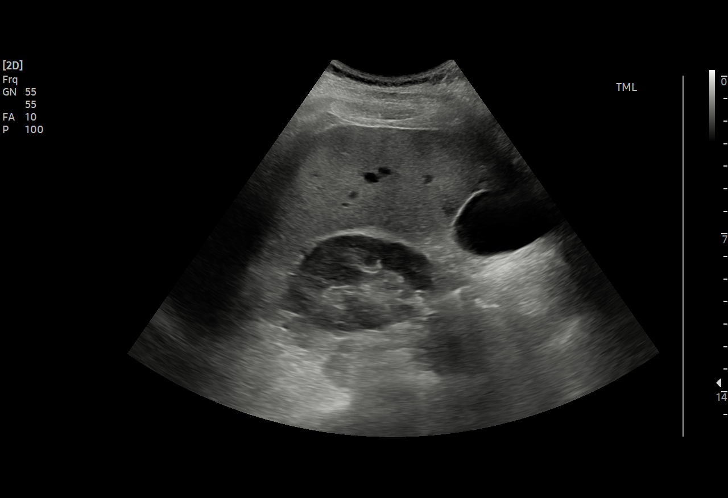
[im 53/58]
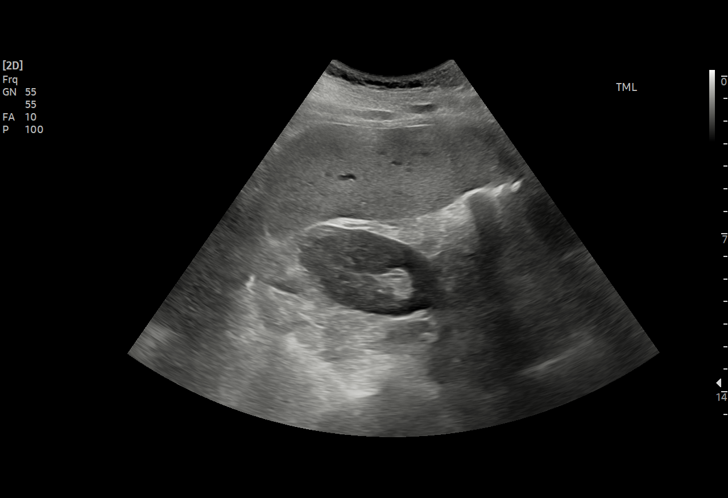
[im 58/58]
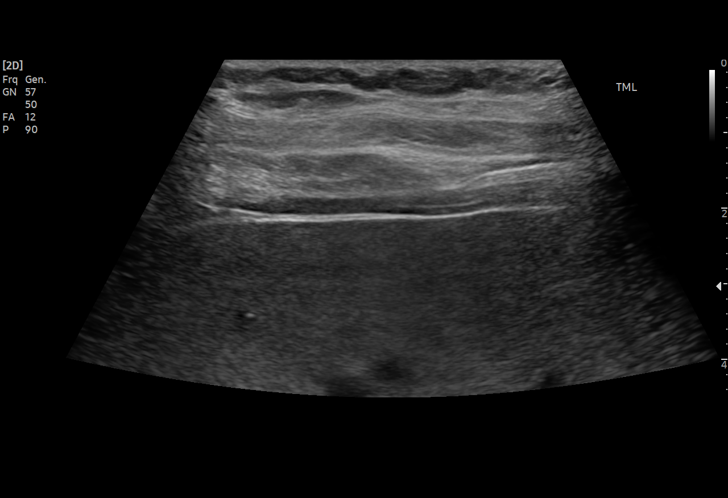

[15 of 25 positions shown; findings below may reference images not displayed]

FINDINGS: Gallbladder:

No gallstones or wall thickening visualized. Layering material in
the dependent gallbladder, likely sludge. No sonographic Murphy sign
noted by sonographer.

Common bile duct:

Diameter: 3.6 mm

Liver:

No focal lesion identified. Markedly increased echogenicity of
hepatic parenchyma concerning for severe steatosis. Portal vein is
patent on color Doppler imaging with normal direction of blood flow
towards the liver.

Other: None.
IMPRESSION: 1.  Severe hepatic steatosis.

2. Gallbladder sludge without evidence of gallbladder wall
thickening or pericholecystic inflammatory changes.

## 2022-10-19 ENCOUNTER — Encounter (HOSPITAL_BASED_OUTPATIENT_CLINIC_OR_DEPARTMENT_OTHER): Payer: Self-pay

## 2022-10-19 ENCOUNTER — Emergency Department (HOSPITAL_BASED_OUTPATIENT_CLINIC_OR_DEPARTMENT_OTHER): Payer: Medicaid Other

## 2022-10-19 ENCOUNTER — Inpatient Hospital Stay (HOSPITAL_BASED_OUTPATIENT_CLINIC_OR_DEPARTMENT_OTHER)
Admission: EM | Admit: 2022-10-19 | Discharge: 2022-11-18 | DRG: 356 | Disposition: A | Discharge status: Home / Self Care | Payer: Medicaid Other | Attending: Internal Medicine | Admitting: Internal Medicine

## 2022-10-19 ENCOUNTER — Other Ambulatory Visit: Payer: Self-pay

## 2022-10-19 DIAGNOSIS — Z9079 Acquired absence of other genital organ(s): Secondary | ICD-10-CM

## 2022-10-19 DIAGNOSIS — W1830XA Fall on same level, unspecified, initial encounter: Secondary | ICD-10-CM | POA: Diagnosis not present

## 2022-10-19 DIAGNOSIS — R971 Elevated cancer antigen 125 [CA 125]: Secondary | ICD-10-CM | POA: Diagnosis present

## 2022-10-19 DIAGNOSIS — C7982 Secondary malignant neoplasm of genital organs: Secondary | ICD-10-CM | POA: Diagnosis present

## 2022-10-19 DIAGNOSIS — F410 Panic disorder [episodic paroxysmal anxiety] without agoraphobia: Secondary | ICD-10-CM | POA: Diagnosis not present

## 2022-10-19 DIAGNOSIS — R112 Nausea with vomiting, unspecified: Secondary | ICD-10-CM

## 2022-10-19 DIAGNOSIS — L089 Local infection of the skin and subcutaneous tissue, unspecified: Secondary | ICD-10-CM | POA: Diagnosis not present

## 2022-10-19 DIAGNOSIS — F17210 Nicotine dependence, cigarettes, uncomplicated: Secondary | ICD-10-CM | POA: Diagnosis present

## 2022-10-19 DIAGNOSIS — Z808 Family history of malignant neoplasm of other organs or systems: Secondary | ICD-10-CM

## 2022-10-19 DIAGNOSIS — E440 Moderate protein-calorie malnutrition: Secondary | ICD-10-CM

## 2022-10-19 DIAGNOSIS — S60420A Blister (nonthermal) of right index finger, initial encounter: Secondary | ICD-10-CM | POA: Diagnosis present

## 2022-10-19 DIAGNOSIS — D75839 Thrombocytosis, unspecified: Secondary | ICD-10-CM | POA: Diagnosis present

## 2022-10-19 DIAGNOSIS — C786 Secondary malignant neoplasm of retroperitoneum and peritoneum: Principal | ICD-10-CM

## 2022-10-19 DIAGNOSIS — D701 Agranulocytosis secondary to cancer chemotherapy: Secondary | ICD-10-CM

## 2022-10-19 DIAGNOSIS — R180 Malignant ascites: Secondary | ICD-10-CM

## 2022-10-19 DIAGNOSIS — S81011A Laceration without foreign body, right knee, initial encounter: Secondary | ICD-10-CM | POA: Diagnosis not present

## 2022-10-19 DIAGNOSIS — E876 Hypokalemia: Secondary | ICD-10-CM | POA: Diagnosis present

## 2022-10-19 DIAGNOSIS — K2100 Gastro-esophageal reflux disease with esophagitis, without bleeding: Secondary | ICD-10-CM | POA: Diagnosis present

## 2022-10-19 DIAGNOSIS — K862 Cyst of pancreas: Secondary | ICD-10-CM | POA: Diagnosis present

## 2022-10-19 DIAGNOSIS — M160 Bilateral primary osteoarthritis of hip: Secondary | ICD-10-CM | POA: Diagnosis present

## 2022-10-19 DIAGNOSIS — G40409 Other generalized epilepsy and epileptic syndromes, not intractable, without status epilepticus: Secondary | ICD-10-CM | POA: Diagnosis present

## 2022-10-19 DIAGNOSIS — Z5111 Encounter for antineoplastic chemotherapy: Secondary | ICD-10-CM

## 2022-10-19 DIAGNOSIS — K5900 Constipation, unspecified: Secondary | ICD-10-CM

## 2022-10-19 DIAGNOSIS — R5381 Other malaise: Secondary | ICD-10-CM | POA: Diagnosis present

## 2022-10-19 DIAGNOSIS — K567 Ileus, unspecified: Secondary | ICD-10-CM | POA: Diagnosis not present

## 2022-10-19 DIAGNOSIS — N838 Other noninflammatory disorders of ovary, fallopian tube and broad ligament: Secondary | ICD-10-CM

## 2022-10-19 DIAGNOSIS — D61818 Other pancytopenia: Secondary | ICD-10-CM | POA: Diagnosis not present

## 2022-10-19 DIAGNOSIS — Z98891 History of uterine scar from previous surgery: Secondary | ICD-10-CM

## 2022-10-19 DIAGNOSIS — I10 Essential (primary) hypertension: Secondary | ICD-10-CM | POA: Diagnosis present

## 2022-10-19 DIAGNOSIS — Y92239 Unspecified place in hospital as the place of occurrence of the external cause: Secondary | ICD-10-CM | POA: Diagnosis not present

## 2022-10-19 DIAGNOSIS — Z515 Encounter for palliative care: Secondary | ICD-10-CM

## 2022-10-19 DIAGNOSIS — N839 Noninflammatory disorder of ovary, fallopian tube and broad ligament, unspecified: Secondary | ICD-10-CM | POA: Diagnosis present

## 2022-10-19 DIAGNOSIS — K2970 Gastritis, unspecified, without bleeding: Secondary | ICD-10-CM | POA: Diagnosis present

## 2022-10-19 DIAGNOSIS — K5909 Other constipation: Secondary | ICD-10-CM

## 2022-10-19 DIAGNOSIS — Z5320 Procedure and treatment not carried out because of patient's decision for unspecified reasons: Secondary | ICD-10-CM | POA: Diagnosis not present

## 2022-10-19 DIAGNOSIS — R1084 Generalized abdominal pain: Principal | ICD-10-CM

## 2022-10-19 DIAGNOSIS — F419 Anxiety disorder, unspecified: Secondary | ICD-10-CM

## 2022-10-19 DIAGNOSIS — R197 Diarrhea, unspecified: Secondary | ICD-10-CM | POA: Diagnosis not present

## 2022-10-19 DIAGNOSIS — K760 Fatty (change of) liver, not elsewhere classified: Secondary | ICD-10-CM | POA: Diagnosis present

## 2022-10-19 DIAGNOSIS — E8809 Other disorders of plasma-protein metabolism, not elsewhere classified: Secondary | ICD-10-CM | POA: Diagnosis present

## 2022-10-19 DIAGNOSIS — F5104 Psychophysiologic insomnia: Secondary | ICD-10-CM | POA: Diagnosis present

## 2022-10-19 DIAGNOSIS — Z833 Family history of diabetes mellitus: Secondary | ICD-10-CM

## 2022-10-19 DIAGNOSIS — Z720 Tobacco use: Secondary | ICD-10-CM

## 2022-10-19 DIAGNOSIS — I2699 Other pulmonary embolism without acute cor pulmonale: Secondary | ICD-10-CM | POA: Clinically undetermined

## 2022-10-19 DIAGNOSIS — Z8049 Family history of malignant neoplasm of other genital organs: Secondary | ICD-10-CM

## 2022-10-19 DIAGNOSIS — T451X5A Adverse effect of antineoplastic and immunosuppressive drugs, initial encounter: Secondary | ICD-10-CM

## 2022-10-19 DIAGNOSIS — D539 Nutritional anemia, unspecified: Secondary | ICD-10-CM | POA: Diagnosis present

## 2022-10-19 DIAGNOSIS — D509 Iron deficiency anemia, unspecified: Secondary | ICD-10-CM | POA: Diagnosis present

## 2022-10-19 DIAGNOSIS — E46 Unspecified protein-calorie malnutrition: Secondary | ICD-10-CM

## 2022-10-19 DIAGNOSIS — Z79899 Other long term (current) drug therapy: Secondary | ICD-10-CM

## 2022-10-19 DIAGNOSIS — R87619 Unspecified abnormal cytological findings in specimens from cervix uteri: Secondary | ICD-10-CM

## 2022-10-19 DIAGNOSIS — L299 Pruritus, unspecified: Secondary | ICD-10-CM | POA: Diagnosis present

## 2022-10-19 DIAGNOSIS — I700 Atherosclerosis of aorta: Secondary | ICD-10-CM | POA: Diagnosis present

## 2022-10-19 DIAGNOSIS — G893 Neoplasm related pain (acute) (chronic): Secondary | ICD-10-CM

## 2022-10-19 DIAGNOSIS — E538 Deficiency of other specified B group vitamins: Secondary | ICD-10-CM | POA: Diagnosis present

## 2022-10-19 DIAGNOSIS — Z888 Allergy status to other drugs, medicaments and biological substances status: Secondary | ICD-10-CM

## 2022-10-19 DIAGNOSIS — R0789 Other chest pain: Secondary | ICD-10-CM | POA: Diagnosis not present

## 2022-10-19 DIAGNOSIS — Z7189 Other specified counseling: Secondary | ICD-10-CM

## 2022-10-19 DIAGNOSIS — R4589 Other symptoms and signs involving emotional state: Secondary | ICD-10-CM

## 2022-10-19 DIAGNOSIS — K8689 Other specified diseases of pancreas: Secondary | ICD-10-CM

## 2022-10-19 DIAGNOSIS — K529 Noninfective gastroenteritis and colitis, unspecified: Secondary | ICD-10-CM | POA: Diagnosis not present

## 2022-10-19 DIAGNOSIS — R21 Rash and other nonspecific skin eruption: Secondary | ICD-10-CM | POA: Diagnosis present

## 2022-10-19 DIAGNOSIS — E871 Hypo-osmolality and hyponatremia: Secondary | ICD-10-CM | POA: Diagnosis present

## 2022-10-19 DIAGNOSIS — Z6830 Body mass index (BMI) 30.0-30.9, adult: Secondary | ICD-10-CM

## 2022-10-19 DIAGNOSIS — C801 Malignant (primary) neoplasm, unspecified: Secondary | ICD-10-CM | POA: Diagnosis present

## 2022-10-19 DIAGNOSIS — K311 Adult hypertrophic pyloric stenosis: Secondary | ICD-10-CM | POA: Diagnosis present

## 2022-10-19 DIAGNOSIS — Z803 Family history of malignant neoplasm of breast: Secondary | ICD-10-CM

## 2022-10-19 DIAGNOSIS — Z8249 Family history of ischemic heart disease and other diseases of the circulatory system: Secondary | ICD-10-CM

## 2022-10-19 DIAGNOSIS — Z800 Family history of malignant neoplasm of digestive organs: Secondary | ICD-10-CM

## 2022-10-19 LAB — COMPREHENSIVE METABOLIC PANEL
ALT: 10 U/L (ref 0–44)
AST: 7 U/L — ABNORMAL LOW (ref 15–41)
Albumin: 3.7 g/dL (ref 3.5–5.0)
Alkaline Phosphatase: 71 U/L (ref 38–126)
Anion gap: 12 (ref 5–15)
BUN: 13 mg/dL (ref 6–20)
CO2: 32 mmol/L (ref 22–32)
Calcium: 9.6 mg/dL (ref 8.9–10.3)
Chloride: 89 mmol/L — ABNORMAL LOW (ref 98–111)
Creatinine, Ser: 0.69 mg/dL (ref 0.44–1.00)
GFR, Estimated: >60 mL/min (ref >60)
Glucose, Bld: 168 mg/dL — ABNORMAL HIGH (ref 70–99)
Potassium: 3.1 mmol/L — ABNORMAL LOW (ref 3.5–5.1)
Sodium: 133 mmol/L — ABNORMAL LOW (ref 135–145)
Total Bilirubin: 0.4 mg/dL (ref 0.3–1.2)
Total Protein: 7.3 g/dL (ref 6.5–8.1)

## 2022-10-19 LAB — CBC
HCT: 30.1 % — ABNORMAL LOW (ref 36.0–46.0)
Hemoglobin: 9.4 g/dL — ABNORMAL LOW (ref 12.0–15.0)
MCH: 23.3 pg — ABNORMAL LOW (ref 26.0–34.0)
MCHC: 31.2 g/dL (ref 30.0–36.0)
MCV: 74.5 fL — ABNORMAL LOW (ref 80.0–100.0)
Platelets: 473 10*3/uL — ABNORMAL HIGH (ref 150–400)
RBC: 4.04 MIL/uL (ref 3.87–5.11)
RDW: 15.8 % — ABNORMAL HIGH (ref 11.5–15.5)
WBC: 11.9 10*3/uL — ABNORMAL HIGH (ref 4.0–10.5)
nRBC: 0 % (ref 0.0–0.2)

## 2022-10-19 LAB — LIPASE, BLOOD: Lipase: <10 U/L — ABNORMAL LOW (ref 11–51)

## 2022-10-19 LAB — URINALYSIS, ROUTINE W REFLEX MICROSCOPIC
Bilirubin Urine: NEGATIVE
Glucose, UA: NEGATIVE mg/dL
Ketones, ur: NEGATIVE mg/dL
Nitrite: NEGATIVE
Specific Gravity, Urine: 1.024 (ref 1.005–1.030)
pH: 6 (ref 5.0–8.0)

## 2022-10-19 LAB — PREGNANCY, URINE: Preg Test, Ur: NEGATIVE

## 2022-10-19 MED ORDER — HYDROMORPHONE HCL 1 MG/ML IJ SOLN
0.5000 mg | Freq: Once | INTRAMUSCULAR | Status: AC
  Administered 2022-10-19: 0.5 mg via INTRAVENOUS
  Filled 2022-10-19: qty 1

## 2022-10-19 MED ORDER — IOHEXOL 300 MG/ML  SOLN
100.0000 mL | Freq: Once | INTRAMUSCULAR | Status: AC | PRN
  Administered 2022-10-19: 80 mL via INTRAVENOUS

## 2022-10-19 MED ORDER — POTASSIUM CHLORIDE 10 MEQ/100ML IV SOLN
10.0000 meq | INTRAVENOUS | Status: AC
  Administered 2022-10-19 (×2): 10 meq via INTRAVENOUS
  Filled 2022-10-19 (×2): qty 100

## 2022-10-19 MED ORDER — LACTATED RINGERS IV SOLN: INTRAVENOUS | Status: DC

## 2022-10-19 NOTE — ED Notes (Signed)
Report given to the Floor RN. 

## 2022-10-19 NOTE — ED Triage Notes (Addendum)
Pt c/o generalized abdominal pain and distention and nausea x1 week.  Pain score 10/10.  Last BM x1 week ago.  Pt reports taking OTC medications w/o relief.  Pt reports she has been unable to have a BM w/o laxatives since last Summer d/t pain medication usage.   Symptoms started after Pt got injections in her hips.

## 2022-10-19 NOTE — ED Provider Notes (Signed)
   ED Course / MDM   Clinical Course as of 10/19/22 1703  Fri Oct 19, 2022  1556 CBC reviewed interpreted significant for leukocytosis and anemia [DR]  1653 Received sign out from Dr. Jeanell Sparrow pending admission. Discussed with hospitalist who will admit. Discussed with Dr. Berline Lopes of [place] who will consult, wouldn't be able to see patient until Monday likely but she will be available over phone to discuss workup and recommendations over weekend, recommends obtaining tumor markers, consult IR for biopsy, likely biopsy of carcinomatosis [WS]    Clinical Course User Index [DR] [name], MD [WS] Cristie Hem, MD   Medical Decision Making Amount and/or Complexity of Data Reviewed Labs: ordered. Radiology: ordered.  Risk Prescription drug management. Decision regarding hospitalization.        Cristie Hem, MD 10/19/22 [phone]

## 2022-10-19 NOTE — Progress Notes (Signed)
Received a call from [facility] ED provider for this patient who presented to the hospital with abdominal pain and constipation.  She has been trying to manage this with over-the-counter medication without any relief.  Upon arrival to the ED she was noted to be hypokalemic with microcytic anemia.  UA showed possibly mild UTI.  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  Patient does not have any prior history of malignancy per ED provider.  At this time patient is not showing any signs of nausea, vomiting or obstruction.  Potassium supplements were given in the ED.  Medical team requested to admit the patient for further management.  I have requested ED provider to also consult GYN oncology for their input.  Upon admission here patient may require consultation with gastroenterology and possible IR for paracentesis which will help with tissue diagnosis.  Eventually may also benefit from oncology.  Admission order to [facility] placed  Gerlean Ren MD [facility]

## 2022-10-19 NOTE — ED Provider Notes (Signed)
[facility] Provider Note   CSN: [id number] Arrival date & time: 10/19/22  1140     History  Chief Complaint  Patient presents with   Abdominal Pain   Constipation    Christina [name] is a 47 y.o. female.  HPI 37 patient presents today with abdominal pain which she  states is diffuse and severe.  She has not had a bowel movement for over a week.  She has had decreased p.o. intake during this time.  She has been taking over-the-counter medications without any relief.  She has had some weight loss over the past several months that was unintentional.  She states she has been taking over-the-counter pain medication but has not taken any narcotics.  She has not had similar symptoms in the past.     Home Medications Prior to Admission medications   Medication Sig Start Date End Date Taking? Authorizing Provider  acetaminophen (TYLENOL) 500 MG tablet Take 500 mg by mouth as needed for mild pain.    [provider]  traMADol (ULTRAM) 50 MG tablet Take 1 tablet (50 mg total) by mouth every 6 (six) hours as needed. 85/0/27   Delora Fuel, MD      Allergies    Claritin [loratadine] and Diphenhydramine    Review of Systems   Review of Systems  Physical Exam Updated Vital Signs BP (!) 160/99 (BP Location: Right Arm)   Pulse 91   Temp 98.4 F (36.9 C) (Oral)   Resp 16   Ht 1.499 m ('4\' 11"'$ )   Wt 69.4 kg   SpO2 100%   BMI 30.90 kg/m  Physical Exam Vitals and nursing note reviewed.  Constitutional:      Appearance: She is well-developed.  HENT:     Head: Normocephalic.     Mouth/Throat:     Mouth: Mucous membranes are moist.  Eyes:     Extraocular Movements: Extraocular movements intact.  Cardiovascular:     Rate and Rhythm: Normal rate and regular rhythm.  Pulmonary:     Effort: Pulmonary effort is normal.     Breath sounds: Normal breath sounds.  Abdominal:     General: Bowel sounds are decreased. There is distension.      Palpations: Abdomen is soft.     Tenderness: There is generalized abdominal tenderness.  Genitourinary:    Rectum: Normal.  Skin:    General: Skin is warm and dry.  Neurological:     Mental Status: She is alert.     ED Results / Procedures / Treatments   Labs (all labs ordered are listed, but only abnormal results are displayed) Labs Reviewed  LIPASE, BLOOD - Abnormal; Notable for the following components:      Result Value   Lipase <10 (*)    All other components within normal limits  COMPREHENSIVE METABOLIC PANEL - Abnormal; Notable for the following components:   Sodium 133 (*)    Potassium 3.1 (*)    Chloride 89 (*)    Glucose, Bld 168 (*)    AST 7 (*)    All other components within normal limits  CBC - Abnormal; Notable for the following components:   WBC 11.9 (*)    Hemoglobin 9.4 (*)    HCT 30.1 (*)    MCV 74.5 (*)    MCH 23.3 (*)    RDW 15.8 (*)    Platelets 473 (*)    All other components within normal limits  URINALYSIS, ROUTINE  W REFLEX MICROSCOPIC - Abnormal; Notable for the following components:   Hgb urine dipstick SMALL (*)    Protein, ur TRACE (*)    Leukocytes,Ua SMALL (*)    Bacteria, UA RARE (*)    All other components within normal limits  PREGNANCY, URINE    EKG None  Radiology CT ABDOMEN PELVIS W CONTRAST  Result Date: 10/19/2022 CLINICAL DATA:  Abdominal pain; * Tracking Code: BO * EXAM: CT ABDOMEN AND PELVIS WITH CONTRAST TECHNIQUE: Multidetector CT imaging of the abdomen and pelvis was performed using the standard protocol following bolus administration of intravenous contrast. RADIATION DOSE REDUCTION: This exam was performed according to the departmental dose-optimization program which includes automated exposure control, adjustment of the mA and/or kV according to patient size and/or use of iterative reconstruction technique. CONTRAST:  71m OMNIPAQUE IOHEXOL 300 MG/ML  SOLN COMPARISON:  CT abdomen and pelvis dated October 21, 2009  FINDINGS: Lower chest: No acute abnormality. Hepatobiliary: No focal liver abnormality is seen. No gallstones, gallbladder wall thickening, or biliary dilatation. Pancreas: Multiloculated cystic lesion of the pancreatic tail measuring 7.5 x 4.1 cm on series 2, image 24. No main pancreatic duct dilation. Spleen: Normal in size without focal abnormality. Adrenals/Urinary Tract: Bilateral adrenal glands are unremarkable. No hydronephrosis. Nonobstructing stones of the lower pole of the left kidney. No suspicious renal lesions. Bladder is unremarkable. Stomach/Bowel: Stomach is within normal limits. No evidence of bowel wall thickening, distention, or inflammatory changes. Vascular/Lymphatic: Mild aortic atherosclerosis. No enlarged abdominal or pelvic lymph nodes. Reproductive: Left-greater-than-right multiloculated cystic and solid masses of the bilateral ovaries measuring 8.2 x 8.3 x 10.1 cm on the left and 9.1 x 6.8 by 9.0 cm on the right. Other: Large volume abdominal ascites, peritoneal implants and omental caking. Reference peritoneal implant of the left paracolic gutter measuring 2.6 x 1.7 cm on series 2, image 29. Reference serosal implant located posterior to the wall of the stomach measuring 1.5 x 1.7 cm on image 30. Musculoskeletal: Tiny sclerotic lesions of the left sacrum are unchanged when compared with prior exam and likely due to bone islands. No aggressive appearing osseous lesions IMPRESSION: 1. Large volume abdominal ascites, peritoneal implants and omental caking, compatible with peritoneal carcinomatosis. 2. Left-greater-than-right multiloculated cystic and solid masses of the bilateral ovaries, differential considerations include primary ovarian malignancy or metastatic tumor to the ovaries. 3. Multiloculated cystic lesion of the pancreatic tail, concerning for primary pancreatic neoplasm versus metastatic disease. 4. Nonobstructing left renal stone. 5.  Aortic Atherosclerosis (ICD10-I70.0).  Electronically Signed   By: LYetta GlassmanM.D.   On: 10/19/2022 15:49    Procedures Procedures    Medications Ordered in ED Medications  lactated ringers infusion (has no administration in time range)  potassium chloride 10 mEq in 100 mL IVPB (has no administration in time range)  iohexol (OMNIPAQUE) 300 MG/ML solution 100 mL (80 mLs Intravenous Contrast Given 10/19/22 1511)    ED Course/ Medical Decision Making/ A&P Clinical Course as of 10/19/22 1558  Fri Oct 19, 2022  1556 CBC reviewed interpreted significant for leukocytosis and anemia [DR]    Clinical Course User Index [DR] RPattricia Boss MD                             Medical Decision Making Amount and/or Complexity of Data Reviewed Labs: ordered. Radiology: ordered.  Risk Prescription drug management.   47year old female with abdominal pain, weight loss, decreased stool output Patient evaluated here  with labs and CT scan. White metabolic panel is significant for sodium low at 133, hypokalemia 3.1, glucose elevated at 168 CBC significant for mild leukocytosis 11,900 hemoglobin was increased at 9.4 from first hemoglobin 10 months counts normal Patient with unintentional weight loss Abdomen is distended here Patient is a smoker CT shows large volume of ascites and pancreatic and ovarian masses Plan consultation to hospitalist service for transfer for admission for further hydration and treatment        Final Clinical Impression(s) / ED Diagnoses Final diagnoses:  Generalized abdominal pain  Ovarian mass  Pancreatic mass    Rx / DC Orders ED Discharge Orders     None         [name], MD 10/19/22 1617

## 2022-10-20 ENCOUNTER — Inpatient Hospital Stay (HOSPITAL_COMMUNITY): Payer: Medicaid Other

## 2022-10-20 DIAGNOSIS — Y92239 Unspecified place in hospital as the place of occurrence of the external cause: Secondary | ICD-10-CM | POA: Diagnosis not present

## 2022-10-20 DIAGNOSIS — K3189 Other diseases of stomach and duodenum: Secondary | ICD-10-CM | POA: Diagnosis not present

## 2022-10-20 DIAGNOSIS — K8689 Other specified diseases of pancreas: Secondary | ICD-10-CM | POA: Diagnosis not present

## 2022-10-20 DIAGNOSIS — D61818 Other pancytopenia: Secondary | ICD-10-CM | POA: Diagnosis not present

## 2022-10-20 DIAGNOSIS — G893 Neoplasm related pain (acute) (chronic): Secondary | ICD-10-CM | POA: Diagnosis present

## 2022-10-20 DIAGNOSIS — K2970 Gastritis, unspecified, without bleeding: Secondary | ICD-10-CM | POA: Diagnosis not present

## 2022-10-20 DIAGNOSIS — Z515 Encounter for palliative care: Secondary | ICD-10-CM | POA: Diagnosis not present

## 2022-10-20 DIAGNOSIS — E871 Hypo-osmolality and hyponatremia: Secondary | ICD-10-CM | POA: Diagnosis present

## 2022-10-20 DIAGNOSIS — T451X5A Adverse effect of antineoplastic and immunosuppressive drugs, initial encounter: Secondary | ICD-10-CM | POA: Diagnosis not present

## 2022-10-20 DIAGNOSIS — Z6830 Body mass index (BMI) 30.0-30.9, adult: Secondary | ICD-10-CM | POA: Diagnosis not present

## 2022-10-20 DIAGNOSIS — W1830XA Fall on same level, unspecified, initial encounter: Secondary | ICD-10-CM | POA: Diagnosis not present

## 2022-10-20 DIAGNOSIS — F17210 Nicotine dependence, cigarettes, uncomplicated: Secondary | ICD-10-CM | POA: Diagnosis present

## 2022-10-20 DIAGNOSIS — K5900 Constipation, unspecified: Secondary | ICD-10-CM | POA: Diagnosis not present

## 2022-10-20 DIAGNOSIS — C801 Malignant (primary) neoplasm, unspecified: Secondary | ICD-10-CM | POA: Diagnosis present

## 2022-10-20 DIAGNOSIS — D509 Iron deficiency anemia, unspecified: Secondary | ICD-10-CM | POA: Diagnosis present

## 2022-10-20 DIAGNOSIS — K311 Adult hypertrophic pyloric stenosis: Secondary | ICD-10-CM | POA: Diagnosis present

## 2022-10-20 DIAGNOSIS — K862 Cyst of pancreas: Secondary | ICD-10-CM | POA: Diagnosis present

## 2022-10-20 DIAGNOSIS — K567 Ileus, unspecified: Secondary | ICD-10-CM | POA: Diagnosis not present

## 2022-10-20 DIAGNOSIS — I2699 Other pulmonary embolism without acute cor pulmonale: Secondary | ICD-10-CM | POA: Diagnosis not present

## 2022-10-20 DIAGNOSIS — K2100 Gastro-esophageal reflux disease with esophagitis, without bleeding: Secondary | ICD-10-CM | POA: Diagnosis not present

## 2022-10-20 DIAGNOSIS — E440 Moderate protein-calorie malnutrition: Secondary | ICD-10-CM | POA: Diagnosis present

## 2022-10-20 DIAGNOSIS — R87619 Unspecified abnormal cytological findings in specimens from cervix uteri: Secondary | ICD-10-CM | POA: Diagnosis not present

## 2022-10-20 DIAGNOSIS — C7982 Secondary malignant neoplasm of genital organs: Secondary | ICD-10-CM | POA: Diagnosis present

## 2022-10-20 DIAGNOSIS — E876 Hypokalemia: Secondary | ICD-10-CM | POA: Diagnosis present

## 2022-10-20 DIAGNOSIS — C786 Secondary malignant neoplasm of retroperitoneum and peritoneum: Secondary | ICD-10-CM | POA: Diagnosis present

## 2022-10-20 DIAGNOSIS — I700 Atherosclerosis of aorta: Secondary | ICD-10-CM | POA: Diagnosis present

## 2022-10-20 DIAGNOSIS — I10 Essential (primary) hypertension: Secondary | ICD-10-CM | POA: Diagnosis present

## 2022-10-20 DIAGNOSIS — K760 Fatty (change of) liver, not elsewhere classified: Secondary | ICD-10-CM | POA: Diagnosis present

## 2022-10-20 DIAGNOSIS — R1084 Generalized abdominal pain: Secondary | ICD-10-CM

## 2022-10-20 DIAGNOSIS — G40409 Other generalized epilepsy and epileptic syndromes, not intractable, without status epilepticus: Secondary | ICD-10-CM | POA: Diagnosis present

## 2022-10-20 DIAGNOSIS — R112 Nausea with vomiting, unspecified: Secondary | ICD-10-CM | POA: Diagnosis not present

## 2022-10-20 DIAGNOSIS — N838 Other noninflammatory disorders of ovary, fallopian tube and broad ligament: Secondary | ICD-10-CM | POA: Diagnosis not present

## 2022-10-20 DIAGNOSIS — R180 Malignant ascites: Secondary | ICD-10-CM | POA: Diagnosis present

## 2022-10-20 DIAGNOSIS — D701 Agranulocytosis secondary to cancer chemotherapy: Secondary | ICD-10-CM | POA: Diagnosis present

## 2022-10-20 LAB — COMPREHENSIVE METABOLIC PANEL
ALT: 13 U/L (ref 0–44)
AST: 12 U/L — ABNORMAL LOW (ref 15–41)
Albumin: 2.7 g/dL — ABNORMAL LOW (ref 3.5–5.0)
Alkaline Phosphatase: 66 U/L (ref 38–126)
Anion gap: 12 (ref 5–15)
BUN: 13 mg/dL (ref 6–20)
CO2: 29 mmol/L (ref 22–32)
Calcium: 8.9 mg/dL (ref 8.9–10.3)
Chloride: 91 mmol/L — ABNORMAL LOW (ref 98–111)
Creatinine, Ser: 0.71 mg/dL (ref 0.44–1.00)
GFR, Estimated: >60 mL/min (ref >60)
Glucose, Bld: 136 mg/dL — ABNORMAL HIGH (ref 70–99)
Potassium: 3.4 mmol/L — ABNORMAL LOW (ref 3.5–5.1)
Sodium: 132 mmol/L — ABNORMAL LOW (ref 135–145)
Total Bilirubin: 0.5 mg/dL (ref 0.3–1.2)
Total Protein: 6.5 g/dL (ref 6.5–8.1)

## 2022-10-20 LAB — CBC
HCT: 30.4 % — ABNORMAL LOW (ref 36.0–46.0)
Hemoglobin: 9.5 g/dL — ABNORMAL LOW (ref 12.0–15.0)
MCH: 23.6 pg — ABNORMAL LOW (ref 26.0–34.0)
MCHC: 31.3 g/dL (ref 30.0–36.0)
MCV: 75.4 fL — ABNORMAL LOW (ref 80.0–100.0)
Platelets: 471 10*3/uL — ABNORMAL HIGH (ref 150–400)
RBC: 4.03 MIL/uL (ref 3.87–5.11)
RDW: 15.9 % — ABNORMAL HIGH (ref 11.5–15.5)
WBC: 11.9 10*3/uL — ABNORMAL HIGH (ref 4.0–10.5)
nRBC: 0 % (ref 0.0–0.2)

## 2022-10-20 LAB — PROTIME-INR
INR: 1.1 (ref 0.8–1.2)
Prothrombin Time: 14 seconds (ref 11.4–15.2)

## 2022-10-20 LAB — ALBUMIN, PLEURAL OR PERITONEAL FLUID: Albumin, Fluid: 2.4 g/dL

## 2022-10-20 LAB — PROTEIN, PLEURAL OR PERITONEAL FLUID: Total protein, fluid: 3.8 g/dL

## 2022-10-20 LAB — AMYLASE, PLEURAL OR PERITONEAL FLUID: Amylase, Fluid: <5 U/L

## 2022-10-20 LAB — LACTATE DEHYDROGENASE, PLEURAL OR PERITONEAL FLUID: LD, Fluid: 144 U/L — ABNORMAL HIGH (ref 3–23)

## 2022-10-20 LAB — BODY FLUID CELL COUNT WITH DIFFERENTIAL
Eos, Fluid: 0 %
Lymphs, Fluid: 32 %
Monocyte-Macrophage-Serous Fluid: 34 % — ABNORMAL LOW (ref 50–90)
Neutrophil Count, Fluid: 34 % — ABNORMAL HIGH (ref 0–25)
Total Nucleated Cell Count, Fluid: 1385 cu mm — ABNORMAL HIGH (ref 0–1000)

## 2022-10-20 LAB — IRON AND TIBC
Iron: 15 ug/dL — ABNORMAL LOW (ref 28–170)
Saturation Ratios: 6 % — ABNORMAL LOW (ref 10.4–31.8)
TIBC: 255 ug/dL (ref 250–450)
UIBC: 240 ug/dL

## 2022-10-20 LAB — GLUCOSE, PLEURAL OR PERITONEAL FLUID: Glucose, Fluid: 121 mg/dL

## 2022-10-20 LAB — TSH: TSH: 3.171 u[IU]/mL (ref 0.350–4.500)

## 2022-10-20 LAB — FERRITIN: Ferritin: 42 ng/mL (ref 11–307)

## 2022-10-20 LAB — VITAMIN B12: Vitamin B-12: 1037 pg/mL — ABNORMAL HIGH (ref 180–914)

## 2022-10-20 MED ORDER — TRAZODONE HCL 50 MG PO TABS
50.0000 mg | ORAL_TABLET | Freq: Every evening | ORAL | Status: DC | PRN
  Filled 2022-10-20: qty 1

## 2022-10-20 MED ORDER — ACETAMINOPHEN 325 MG PO TABS: 650.0000 mg | ORAL_TABLET | Freq: Four times a day (QID) | ORAL | Status: DC | PRN

## 2022-10-20 MED ORDER — METOPROLOL TARTRATE 5 MG/5ML IV SOLN: 5.0000 mg | INTRAVENOUS | Status: DC | PRN

## 2022-10-20 MED ORDER — DOCUSATE SODIUM 100 MG PO CAPS
100.0000 mg | ORAL_CAPSULE | Freq: Two times a day (BID) | ORAL | Status: DC
  Administered 2022-10-21 (×2): 100 mg via ORAL
  Filled 2022-10-20 (×2): qty 1

## 2022-10-20 MED ORDER — HYDROMORPHONE HCL 1 MG/ML IJ SOLN
0.5000 mg | INTRAMUSCULAR | Status: DC | PRN
  Administered 2022-10-20 – 2022-10-28 (×32): 1 mg via INTRAVENOUS
  Filled 2022-10-20 (×33): qty 1

## 2022-10-20 MED ORDER — HYDROCODONE-ACETAMINOPHEN 5-325 MG PO TABS
1.0000 | ORAL_TABLET | ORAL | Status: DC | PRN
  Administered 2022-10-20 – 2022-10-25 (×13): 2 via ORAL
  Administered 2022-10-25: 1 via ORAL
  Administered 2022-10-25 – 2022-10-27 (×3): 2 via ORAL
  Filled 2022-10-20 (×2): qty 2
  Filled 2022-10-20: qty 1
  Filled 2022-10-20 (×15): qty 2

## 2022-10-20 MED ORDER — ENOXAPARIN SODIUM 40 MG/0.4ML IJ SOSY
40.0000 mg | PREFILLED_SYRINGE | Freq: Every day | INTRAMUSCULAR | Status: DC
  Administered 2022-10-20 – 2022-10-25 (×6): 40 mg via SUBCUTANEOUS
  Filled 2022-10-20 (×6): qty 0.4

## 2022-10-20 MED ORDER — SENNOSIDES-DOCUSATE SODIUM 8.6-50 MG PO TABS: 1.0000 | ORAL_TABLET | Freq: Every evening | ORAL | Status: DC | PRN

## 2022-10-20 MED ORDER — NALOXONE HCL 0.4 MG/ML IJ SOLN: 0.4000 mg | INTRAMUSCULAR | Status: DC | PRN

## 2022-10-20 MED ORDER — HYDROMORPHONE HCL 1 MG/ML IJ SOLN
0.5000 mg | Freq: Once | INTRAMUSCULAR | Status: AC | PRN
  Administered 2022-10-20: 0.5 mg via INTRAVENOUS
  Filled 2022-10-20: qty 0.5

## 2022-10-20 MED ORDER — IPRATROPIUM-ALBUTEROL 0.5-2.5 (3) MG/3ML IN SOLN: 3.0000 mL | RESPIRATORY_TRACT | Status: DC | PRN

## 2022-10-20 MED ORDER — OXYCODONE HCL 5 MG PO TABS
5.0000 mg | ORAL_TABLET | ORAL | Status: DC | PRN
  Administered 2022-10-20 – 2022-10-25 (×15): 5 mg via ORAL
  Filled 2022-10-20 (×15): qty 1

## 2022-10-20 MED ORDER — ONDANSETRON HCL 4 MG/2ML IJ SOLN
4.0000 mg | Freq: Four times a day (QID) | INTRAMUSCULAR | Status: DC | PRN
  Administered 2022-10-23 – 2022-11-13 (×20): 4 mg via INTRAVENOUS
  Filled 2022-10-20 (×21): qty 2

## 2022-10-20 MED ORDER — HYDRALAZINE HCL 20 MG/ML IJ SOLN: 10.0000 mg | INTRAMUSCULAR | Status: DC | PRN

## 2022-10-20 MED ORDER — FERROUS SULFATE 325 (65 FE) MG PO TABS
325.0000 mg | ORAL_TABLET | Freq: Every day | ORAL | Status: DC
  Administered 2022-10-21 – 2022-10-31 (×6): 325 mg via ORAL
  Filled 2022-10-20 (×16): qty 1

## 2022-10-20 MED ORDER — ENSURE ENLIVE PO LIQD
237.0000 mL | Freq: Three times a day (TID) | ORAL | Status: DC
  Administered 2022-10-20 – 2022-10-23 (×6): 237 mL via ORAL

## 2022-10-20 MED ORDER — SODIUM CHLORIDE 0.9 % IV SOLN: INTRAVENOUS | Status: AC

## 2022-10-20 MED ORDER — GUAIFENESIN 100 MG/5ML PO LIQD: 5.0000 mL | ORAL | Status: DC | PRN

## 2022-10-20 MED ORDER — LIDOCAINE HCL 1 % IJ SOLN
INTRAMUSCULAR | Status: AC
  Administered 2022-10-20: 10 mL
  Filled 2022-10-20: qty 20

## 2022-10-20 NOTE — Procedures (Signed)
PROCEDURE SUMMARY:  Successful US guided paracentesis from [facility].  Yielded 3.5L of ascitic fluid.  No immediate complications.  Pt tolerated well.   Specimen was sent for labs.  EBL < 84m  Keelin Neville PA-C 10/20/2022 11:03 AM

## 2022-10-20 NOTE — ED Notes (Signed)
  Witnessed 0.5 mg waste of dilaudid with Psychologist, occupational.

## 2022-10-20 NOTE — Progress Notes (Signed)
Brief IR Note: Consulted regarding peritoneal carcinomatosis and IR asked for biopsy.  Imaging reviewed by Dr. Maryelizabeth Kaufmann.  Omental caking but not true omental implants.  Sampling the omental caking would be challenging and potentially risky given the proximity to the bowel.  IR recommendation is to await cytology from earlier performed paracentesis and revisiting the idea of biopsy if still needed at that time.   Electronically Signed: Pasty Spillers, PA-C 10/20/2022, 12:24 PM

## 2022-10-20 NOTE — Progress Notes (Signed)
0015 Pt arrived to unit [facility] Admitting notified of pt's arrival. Pt alert and oriented x4, oriented to surrounds. Call light within reach.   0100 Admitting notified of pt's arrival   0133 Admitting re-paged to confirm notification of pt's arrival page was received and hospitalist notified.   [phone] admitting hospitalist paged via secured chat to notify pt is requesting something for pain.

## 2022-10-20 NOTE — H&P (Signed)
History and Physical    Christina [name] IPJ:[id number] DOB: [birth year] DOA: 10/19/2022  PCP: [name], MD Patient coming from: Home  Chief Complaint: Abdominal pain  HPI: Christina [name] is a 47 y.o. female with medical history significant of anxiety, HPV comes to the hospital at [facility] ED with complaints of abdominal distention and constipation.  Patient states for the past almost 1 year she has had some abdominal discomfort and thought to be constipation.  This has previously been treated by laxatives, antispasmodics and enema.  No prior imaging, colonoscopy within the last year.  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 Glo Herring from [place].  She has not really kept up with her medical care.  She also admits of unintentionally losing about 30 pounds in last 3 months.  In the ER she was found to have hypokalemia with microcytic anemia.  CT abdomen pelvis showed peritoneal carcinomatosis with extensive metastases, ascites and multiloculated cystic lesion of the pancreatic tail.  Patient transferred to [facility] for further management.  CODE STATUS-full   Review of Systems: As per HPI otherwise 10 point review of systems negative.  Review of Systems Otherwise negative except as per HPI, including: General: Denies fever, chills, night sweats or unintended weight loss. Resp: Denies cough, wheezing, shortness of breath. Cardiac: Denies chest pain, palpitations, orthopnea, paroxysmal nocturnal dyspnea. GI: Denies diarrhea and constipation GU: Denies dysuria, frequency, hesitancy or incontinence MS: Denies muscle aches, joint pain or swelling Neuro: Denies headache, neurologic deficits (focal weakness, numbness, tingling), abnormal gait Psych: Denies anxiety, depression, SI/HI/AVH Skin: Denies new rashes or lesions ID: Denies sick contacts, exotic exposures, travel  Past Medical History:  Diagnosis Date   Anxiety    Depression    Gestational  diabetes mellitus, antepartum 2015   managed with diet   Hypertension    on meds until wt loss   PONV (postoperative nausea and vomiting)    Seizures ([place]) 2001   stressed induced; 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    Past Surgical History:  Procedure Laterality Date   APPENDECTOMY     CESAREAN SECTION     CESAREAN SECTION N/A 07/14/2014   Procedure: CESAREAN SECTION;  Surgeon: Cheri Fowler, MD;  Location: [facility];  Service: Obstetrics;  Laterality: N/A;   CESAREAN SECTION N/A 05/23/2015   Procedure: CESAREAN SECTION;  Surgeon: Cheri Fowler, MD;  Location: [facility];  Service: Obstetrics;  Laterality: N/A;   LAPAROSCOPIC BILATERAL SALPINGECTOMY Bilateral 06/12/2016   Procedure: LAPAROSCOPIC BILATERAL SALPINGECTOMY;  Surgeon: Jonnie Kind, MD;  Location: [facility];  Service: Gynecology;  Laterality: Bilateral;    SOCIAL HISTORY:  reports that she has been smoking cigarettes. She has a 12.00 pack-year smoking history. She has never used smokeless tobacco. She reports current alcohol use. She reports that she does not use drugs.  Allergies  Allergen Reactions   Claritin [Loratadine] Other (See Comments)    causes spasms   Diphenhydramine Other (See Comments)    Causes spasms and keeps alert for days   Lorazepam Other (See Comments)    After a few doses it made her feel paranoid    FAMILY HISTORY: Family History  Problem Relation Age of Onset   Cancer Father    Hypertension Mother    Diabetes Mother    Autism Son      Prior to Admission medications   Medication Sig Start Date End Date Taking? Authorizing Provider  acetaminophen (TYLENOL)  500 MG tablet Take 500-1,000 mg by mouth 3 (three) times daily as needed for mild pain or moderate pain.   Yes [provider]  dicyclomine (BENTYL) 20 MG tablet Take 20 mg by mouth as needed for spasms. 07/18/22  Yes [provider]  Menthol, Topical Analgesic, (ICY HOT EX) Apply 1 application  topically as needed  (pain).   Yes [provider]  OVER THE COUNTER MEDICATION Take 2 Bars by mouth as needed (constipation). Exlax Bars   Yes [provider]  polyethylene glycol (MIRALAX / GLYCOLAX) 17 g packet Take 17 g by mouth daily as needed for moderate constipation or severe constipation.   Yes [provider]  Simethicone (GAS-X PO) Take 2 tablets by mouth as needed (bloat).   Yes [provider]  Sodium Phosphates (FLEET ENEMA RE) Place 1 Dose rectally as needed (consttipation).   Yes [provider]    Physical Exam: Vitals:   10/20/22 0915 10/20/22 0920 10/20/22 0925 10/20/22 1143  BP: (!) 167/104 (!) 165/97 (!) 157/91 (!) 143/96  Pulse:    100  Resp:    16  Temp:    98.5 F (36.9 C)  TempSrc:    Oral  SpO2:    97%  Weight:      Height:          Constitutional: NAD, calm, comfortable Eyes: PERRL, lids and conjunctivae normal ENMT: Mucous membranes are moist. Posterior pharynx clear of any exudate or lesions.Normal dentition.  Neck: normal, supple, no masses, no thyromegaly Respiratory: clear to auscultation bilaterally, no wheezing, no crackles. Normal respiratory effort. No accessory muscle use.  Cardiovascular: Regular rate and rhythm, no murmurs / rubs / gallops. No extremity edema. 2+ pedal pulses. No carotid bruits.  Abdomen: Abdominal distention, fluid wave shift.  Nontender Musculoskeletal: no clubbing / cyanosis. No joint deformity upper and lower extremities. Good ROM, no contractures. Normal muscle tone.  Skin: no rashes, lesions, ulcers. No induration Neurologic: CN 2-12 grossly intact. Sensation intact, DTR normal. Strength 5/5 in all 4.  Psychiatric: Normal judgment and insight. Alert and oriented x 3. Normal mood.     Labs on Admission: I have personally reviewed following labs and imaging studies  CBC: Recent Labs  Lab 10/19/22 1230 10/20/22 0836  WBC 11.9* 11.9*  HGB 9.4* 9.5*  HCT 30.1* 30.4*  MCV 74.5* 75.4*  PLT  473* 188*   Basic Metabolic Panel: Recent Labs  Lab 10/19/22 1230 10/20/22 0836  NA 133* 132*  K 3.1* 3.4*  CL 89* 91*  CO2 32 29  GLUCOSE 168* 136*  BUN 13 13  CREATININE 0.69 0.71  CALCIUM 9.6 8.9   GFR: Estimated Creatinine Clearance: 72.9 mL/min (by C-G formula based on SCr of 0.71 mg/dL). Liver Function Tests: Recent Labs  Lab 10/19/22 1230 10/20/22 0836  AST 7* 12*  ALT 10 13  ALKPHOS 71 66  BILITOT 0.4 0.5  PROT 7.3 6.5  ALBUMIN 3.7 2.7*   Recent Labs  Lab 10/19/22 1230  LIPASE <10*   No results for input(s): "AMMONIA" in the last 168 hours. Coagulation Profile: Recent Labs  Lab 10/20/22 0836  INR 1.1   Cardiac Enzymes: No results for input(s): "CKTOTAL", "CKMB", "CKMBINDEX", "TROPONINI" in the last 168 hours. BNP (last 3 results) No results for input(s): "PROBNP" in the last 8760 hours. HbA1C: No results for input(s): "HGBA1C" in the last 72 hours. CBG: No results for input(s): "GLUCAP" in the last 168 hours. Lipid Profile: No results for  input(s): "CHOL", "HDL", "LDLCALC", "TRIG", "CHOLHDL", "LDLDIRECT" in the last 72 hours. Thyroid Function Tests: Recent Labs    10/20/22 0836  TSH 3.171   Anemia Panel: Recent Labs    10/20/22 0836  VITAMINB12 1,037*  FERRITIN 42  TIBC 255  IRON 15*   Urine analysis:    Component Value Date/Time   COLORURINE YELLOW 10/19/2022 1325   APPEARANCEUR CLEAR 10/19/2022 1325   LABSPEC 1.024 10/19/2022 1325   PHURINE 6.0 10/19/2022 1325   GLUCOSEU NEGATIVE 10/19/2022 1325   HGBUR SMALL (A) 10/19/2022 1325   BILIRUBINUR NEGATIVE 10/19/2022 1325   KETONESUR NEGATIVE 10/19/2022 1325   PROTEINUR TRACE (A) 10/19/2022 1325   UROBILINOGEN 2.0 (H) 05/23/2015 0022   NITRITE NEGATIVE 10/19/2022 1325   LEUKOCYTESUR SMALL (A) 10/19/2022 1325   Sepsis Labs: !!!!!!!!!!!!!!!!!!!!!!!!!!!!!!!!!!!!!!!!!!!! '@LABRCNTIP'$ (procalcitonin:4,lacticidven:4) )No results found for this or any previous visit (from the past 240  hour(s)).   Radiological Exams on Admission: US Paracentesis  Result Date: 10/20/2022 INDICATION: Peritoneal carcinomatosis with ascites EXAM: ULTRASOUND GUIDED DIAGNOSTIC AND THERAPEUTIC PARACENTESIS MEDICATIONS: None. COMPLICATIONS: None immediate. PROCEDURE: Informed written consent was obtained from the patient after a discussion of the risks, benefits and alternatives to treatment. A timeout was performed prior to the initiation of the procedure. Initial ultrasound scanning demonstrates a moderate amount of ascites within the left upper abdominal quadrant. The left upper abdomen was prepped and draped in the usual sterile fashion. 1% lidocaine was used for local anesthesia. Following this, a 19 gauge, 7-cm, Yueh catheter was introduced under direct US guidance. An ultrasound image was saved for documentation purposes. The paracentesis was performed. The catheter was removed and a dressing was applied. The patient tolerated the procedure well without immediate post procedural complication. FINDINGS: A total of approximately 3.5L of ascitic fluid was removed. Samples were sent to the laboratory as requested by the clinical team. IMPRESSION: Successful ultrasound-guided diagnostic and therapeutic paracentesis yielding 3.5 L of peritoneal fluid. Read by Pasty Spillers, IR PA Electronically Signed   By: Michaelle Birks M.D.   On: 10/20/2022 11:17   CT ABDOMEN PELVIS W CONTRAST  Result Date: 10/19/2022 CLINICAL DATA:  Abdominal pain; * Tracking Code: BO * EXAM: CT ABDOMEN AND PELVIS WITH CONTRAST TECHNIQUE: Multidetector CT imaging of the abdomen and pelvis was performed using the standard protocol following bolus administration of intravenous contrast. RADIATION DOSE REDUCTION: This exam was performed according to the departmental dose-optimization program which includes automated exposure control, adjustment of the mA and/or kV according to patient size and/or use of iterative reconstruction technique.  CONTRAST:  71m OMNIPAQUE IOHEXOL 300 MG/ML  SOLN COMPARISON:  CT abdomen and pelvis dated October 21, 2009 FINDINGS: Lower chest: No acute abnormality. Hepatobiliary: No focal liver abnormality is seen. No gallstones, gallbladder wall thickening, or biliary dilatation. Pancreas: Multiloculated cystic lesion of the pancreatic tail measuring 7.5 x 4.1 cm on series 2, image 24. No main pancreatic duct dilation. Spleen: Normal in size without focal abnormality. Adrenals/Urinary Tract: Bilateral adrenal glands are unremarkable. No hydronephrosis. Nonobstructing stones of the lower pole of the left kidney. No suspicious renal lesions. Bladder is unremarkable. Stomach/Bowel: Stomach is within normal limits. No evidence of bowel wall thickening, distention, or inflammatory changes. Vascular/Lymphatic: Mild aortic atherosclerosis. No enlarged abdominal or pelvic lymph nodes. Reproductive: Left-greater-than-right multiloculated cystic and solid masses of the bilateral ovaries measuring 8.2 x 8.3 x 10.1 cm on the left and 9.1 x 6.8 by 9.0 cm on the right. Other: Large volume abdominal ascites, peritoneal implants and omental caking.  Reference peritoneal implant of the left paracolic gutter measuring 2.6 x 1.7 cm on series 2, image 29. Reference serosal implant located posterior to the wall of the stomach measuring 1.5 x 1.7 cm on image 30. Musculoskeletal: Tiny sclerotic lesions of the left sacrum are unchanged when compared with prior exam and likely due to bone islands. No aggressive appearing osseous lesions IMPRESSION: 1. Large volume abdominal ascites, peritoneal implants and omental caking, compatible with peritoneal carcinomatosis. 2. Left-greater-than-right multiloculated cystic and solid masses of the bilateral ovaries, differential considerations include primary ovarian malignancy or metastatic tumor to the ovaries. 3. Multiloculated cystic lesion of the pancreatic tail, concerning for primary pancreatic neoplasm  versus metastatic disease. 4. Nonobstructing left renal stone. 5.  Aortic Atherosclerosis (ICD10-I70.0). Electronically Signed   By: Yetta Glassman M.D.   On: 10/19/2022 15:49     All images have been reviewed by me personally.    Assessment/Plan Principal Problem:   Peritoneal carcinomatosis ([place]) Active Problems:   S/P cesarean section    Abdominal pain peritoneal carcinomatosis, ascites -There is concerns of extensive metastic disease with unknown primary seen on CT scan.  This is a new diagnosis.  Underwent paracentesis this morning, about 3.5 L was removed.  No obvious evidence of infection was noted but cytology was sent.  If cytology is unrevealing then she will need omental biopsy.  Dr. Berline Lopes from GYN oncology has been consulted he will see the patient on Monday. - CEA, CA 19-9 and CA125 has been ordered  Microcytic anemia, iron deficiency - Iron supplements with bowel regimen  Moderate protein calorie malnutrition - Nutrition consult.  Oral supplements.  Encourage oral diet.  Patient understands overall she may have poor diagnosis but at this time we will await for cytology results   DVT prophylaxis: Lovenox Code Status: Full code Family Communication: None but I offered to call however she would like me to Consults called: GYN oncology-Dr. Berline Lopes, interventional radiology Admission status: Admit to MedSurg  Status is: Inpatient Remains inpatient appropriate because: New peritoneal carcinomatosis, ascites.  Ongoing evaluation for malignancy.  Maintain hospital stay.   Time Spent: 65 minutes.  >50% of the time was devoted to discussing the patients care, assessment, plan and disposition with other care givers along with counseling the patient about the risks and benefits of treatment.    Marvel Mcphillips Arsenio Loader MD [facility]  If 7PM-7AM, please contact night-coverage   10/20/2022, 2:05 PM

## 2022-10-21 DIAGNOSIS — C786 Secondary malignant neoplasm of retroperitoneum and peritoneum: Secondary | ICD-10-CM | POA: Diagnosis not present

## 2022-10-21 LAB — CBC
HCT: 29.5 % — ABNORMAL LOW (ref 36.0–46.0)
Hemoglobin: 9 g/dL — ABNORMAL LOW (ref 12.0–15.0)
MCH: 23.2 pg — ABNORMAL LOW (ref 26.0–34.0)
MCHC: 30.5 g/dL (ref 30.0–36.0)
MCV: 76 fL — ABNORMAL LOW (ref 80.0–100.0)
Platelets: 381 10*3/uL (ref 150–400)
RBC: 3.88 MIL/uL (ref 3.87–5.11)
RDW: 15.8 % — ABNORMAL HIGH (ref 11.5–15.5)
WBC: 9.6 10*3/uL (ref 4.0–10.5)
nRBC: 0 % (ref 0.0–0.2)

## 2022-10-21 LAB — COMPREHENSIVE METABOLIC PANEL
ALT: 13 U/L (ref 0–44)
AST: 12 U/L — ABNORMAL LOW (ref 15–41)
Albumin: 2.2 g/dL — ABNORMAL LOW (ref 3.5–5.0)
Alkaline Phosphatase: 58 U/L (ref 38–126)
Anion gap: 11 (ref 5–15)
BUN: 12 mg/dL (ref 6–20)
CO2: 27 mmol/L (ref 22–32)
Calcium: 8.2 mg/dL — ABNORMAL LOW (ref 8.9–10.3)
Chloride: 95 mmol/L — ABNORMAL LOW (ref 98–111)
Creatinine, Ser: 0.66 mg/dL (ref 0.44–1.00)
GFR, Estimated: >60 mL/min (ref >60)
Glucose, Bld: 138 mg/dL — ABNORMAL HIGH (ref 70–99)
Potassium: 3.4 mmol/L — ABNORMAL LOW (ref 3.5–5.1)
Sodium: 133 mmol/L — ABNORMAL LOW (ref 135–145)
Total Bilirubin: 0.5 mg/dL (ref 0.3–1.2)
Total Protein: 5.3 g/dL — ABNORMAL LOW (ref 6.5–8.1)

## 2022-10-21 LAB — HIV ANTIBODY (ROUTINE TESTING W REFLEX): HIV Screen 4th Generation wRfx: NONREACTIVE

## 2022-10-21 LAB — FOLATE: Folate: 4.5 ng/mL — ABNORMAL LOW (ref >5.9)

## 2022-10-21 LAB — MAGNESIUM: Magnesium: 1.9 mg/dL (ref 1.7–2.4)

## 2022-10-21 MED ORDER — POTASSIUM CHLORIDE CRYS ER 20 MEQ PO TBCR
40.0000 meq | EXTENDED_RELEASE_TABLET | Freq: Once | ORAL | Status: AC
  Administered 2022-10-21: 40 meq via ORAL
  Filled 2022-10-21: qty 2

## 2022-10-21 MED ORDER — DIPHENHYDRAMINE-ZINC ACETATE 2-0.1 % EX CREA
TOPICAL_CREAM | Freq: Three times a day (TID) | CUTANEOUS | Status: DC | PRN
  Filled 2022-10-21: qty 28

## 2022-10-21 MED ORDER — POTASSIUM CHLORIDE 10 MEQ/100ML IV SOLN
10.0000 meq | INTRAVENOUS | Status: AC
  Administered 2022-10-21 (×2): 10 meq via INTRAVENOUS
  Filled 2022-10-21: qty 100

## 2022-10-21 MED ORDER — FOLIC ACID 1 MG PO TABS
1.0000 mg | ORAL_TABLET | Freq: Every day | ORAL | Status: DC
  Administered 2022-10-21 – 2022-11-01 (×10): 1 mg via ORAL
  Filled 2022-10-21 (×11): qty 1

## 2022-10-21 MED ORDER — MAGNESIUM SULFATE 2 GM/50ML IV SOLN
2.0000 g | Freq: Once | INTRAVENOUS | Status: AC
  Administered 2022-10-21: 2 g via INTRAVENOUS
  Filled 2022-10-21: qty 50

## 2022-10-21 NOTE — Progress Notes (Signed)
PROGRESS NOTE    Christina [name]  INO:[id number] DOB: 1976/09/08 DOA: 10/19/2022 PCP: [name], MD   Brief Narrative:  47 y.o. female with medical history significant of anxiety, HPV comes to the hospital at [facility] ED with complaints of abdominal distention and constipation. CT abdomen pelvis showed peritoneal carcinomatosis with extensive metastases, ascites and multiloculated cystic lesion of the pancreatic tail.  GYN oncology consulted, underwent paracentesis, 3.5 L was removed.   Assessment & Plan:  Principal Problem:   Peritoneal carcinomatosis ([place]) Active Problems:   S/P cesarean section      Abdominal pain peritoneal carcinomatosis, ascites -There is concerns of extensive metastic disease with unknown primary seen on CT scan.  This is a new diagnosis.  Underwent paracentesis on 2/3 about 3.5 L was removed.  No obvious evidence of infection was noted but cytology was sent.  If cytology is unrevealing then she will need omental biopsy.  Dr. Berline Lopes from GYN oncology has been consulted he will see the patient on Monday. - CEA, CA 19-9 and CA125-pending   Microcytic anemia, iron deficiency Folic acid deficiency - Iron supplements with bowel regimen.  Folic acid supplements   Moderate protein calorie malnutrition - Nutrition consult.  Oral supplements.  Encourage oral diet.  Hypokalemia/hypomagnesemia - As needed repletion  Bilateral lower extremity rash on her knees - Tells me is pruritic and it appeared about 3 days ago.  Started on Benadryl cream, will continue to monitor   Patient understands overall she may have poor diagnosis but at this time we will await for cytology results         DVT prophylaxis: Lovenox Code Status: Full code Family Communication: Wants me to discuss her case with her ex-husband/roommate tomorrow when he arrives here  Status is: Inpatient Maintain hospital stay for ongoing evaluation of knee diagnosis of peritoneal  carcinomatosis      Subjective: Seen and examined at bedside, tells me her upper abdomen feels a little full otherwise no other complaints   Examination:  Constitutional: Not in acute distress Respiratory: Clear to auscultation bilaterally Cardiovascular: Normal sinus rhythm, no rubs Abdomen: Nontender, very mildly distended.  Minimal fluid wave shift. Musculoskeletal: No edema noted Skin: No rashes seen Neurologic: CN 2-12 grossly intact.  And nonfocal Psychiatric: Normal judgment and insight. Alert and oriented x 3. Normal mood.  Objective: Vitals:   10/20/22 1143 10/20/22 1515 10/20/22 2159 10/21/22 0533  BP: (!) 143/96 (!) 147/87 (!) 164/84 (!) 155/98  Pulse: 100 95 88 79  Resp: '16 14 17 18  '$ Temp: 98.5 F (36.9 C) 99.1 F (37.3 C) 99.1 F (37.3 C) 98.4 F (36.9 C)  TempSrc: Oral Oral Oral Oral  SpO2: 97% 96% 95% 100%  Weight:      Height:        Intake/Output Summary (Last 24 hours) at 10/21/2022 1143 Last data filed at 10/21/2022 0300 Gross per 24 hour  Intake 2433.19 ml  Output --  Net 2433.19 ml   Filed Weights   10/19/22 1156  Weight: 69.4 kg     Data Reviewed:   CBC: Recent Labs  Lab 10/19/22 1230 10/20/22 0836 10/21/22 0617  WBC 11.9* 11.9* 9.6  HGB 9.4* 9.5* 9.0*  HCT 30.1* 30.4* 29.5*  MCV 74.5* 75.4* 76.0*  PLT 473* 471* 096   Basic Metabolic Panel: Recent Labs  Lab 10/19/22 1230 10/20/22 0836 10/21/22 0617  NA 133* 132* 133*  K 3.1* 3.4* 3.4*  CL 89* 91* 95*  CO2 32 29  27  GLUCOSE 168* 136* 138*  BUN '13 13 12  '$ CREATININE 0.69 0.71 0.66  CALCIUM 9.6 8.9 8.2*  MG  --   --  1.9   GFR: Estimated Creatinine Clearance: 72.9 mL/min (by C-G formula based on SCr of 0.66 mg/dL). Liver Function Tests: Recent Labs  Lab 10/19/22 1230 10/20/22 0836 10/21/22 0617  AST 7* 12* 12*  ALT '10 13 13  '$ ALKPHOS 71 66 58  BILITOT 0.4 0.5 0.5  PROT 7.3 6.5 5.3*  ALBUMIN 3.7 2.7* 2.2*   Recent Labs  Lab 10/19/22 1230  LIPASE <10*   No  results for input(s): "AMMONIA" in the last 168 hours. Coagulation Profile: Recent Labs  Lab 10/20/22 0836  INR 1.1   Cardiac Enzymes: No results for input(s): "CKTOTAL", "CKMB", "CKMBINDEX", "TROPONINI" in the last 168 hours. BNP (last 3 results) No results for input(s): "PROBNP" in the last 8760 hours. HbA1C: No results for input(s): "HGBA1C" in the last 72 hours. CBG: No results for input(s): "GLUCAP" in the last 168 hours. Lipid Profile: No results for input(s): "CHOL", "HDL", "LDLCALC", "TRIG", "CHOLHDL", "LDLDIRECT" in the last 72 hours. Thyroid Function Tests: Recent Labs    10/20/22 0836  TSH 3.171   Anemia Panel: Recent Labs    10/20/22 0836 10/21/22 0617  VITAMINB12 1,037*  --   FOLATE  --  4.5*  FERRITIN 42  --   TIBC 255  --   IRON 15*  --    Sepsis Labs: No results for input(s): "PROCALCITON", "LATICACIDVEN" in the last 168 hours.  Recent Results (from the past 240 hour(s))  Aerobic/Anaerobic Culture w Gram Stain (surgical/deep wound)     Status: None (Preliminary result)   Collection Time: 10/20/22  9:45 AM   Specimen: PATH Cytology Peritoneal fluid  Result Value Ref Range Status   Specimen Description   Final    PERITONEAL Performed at [facility] [address], [place], [place] [address]    Special Requests   Final    NONE Performed at [facility], [place] [address] [place], [place] [address]    Gram Stain   Final    RARE WBC PRESENT, PREDOMINANTLY PMN NO ORGANISMS SEEN Performed at [facility], [place] [address], [place], [place] [address]    Culture PENDING  Incomplete   Report Status PENDING  Incomplete         Radiology Studies: US Paracentesis  Result Date: 10/20/2022 INDICATION: Peritoneal carcinomatosis with ascites EXAM: ULTRASOUND GUIDED DIAGNOSTIC AND THERAPEUTIC PARACENTESIS MEDICATIONS: None. COMPLICATIONS: None immediate. PROCEDURE: Informed written consent was obtained from the  patient after a discussion of the risks, benefits and alternatives to treatment. A timeout was performed prior to the initiation of the procedure. Initial ultrasound scanning demonstrates a moderate amount of ascites within the left upper abdominal quadrant. The left upper abdomen was prepped and draped in the usual sterile fashion. 1% lidocaine was used for local anesthesia. Following this, a 19 gauge, 7-cm, Yueh catheter was introduced under direct US guidance. An ultrasound image was saved for documentation purposes. The paracentesis was performed. The catheter was removed and a dressing was applied. The patient tolerated the procedure well without immediate post procedural complication. FINDINGS: A total of approximately 3.5L of ascitic fluid was removed. Samples were sent to the laboratory as requested by the clinical team. IMPRESSION: Successful ultrasound-guided diagnostic and therapeutic paracentesis yielding 3.5 L of peritoneal fluid. Read by Pasty [name], IR PA Electronically Signed   By: Wille Glaser  Mugweru M.D.   On: 10/20/2022 11:17   CT ABDOMEN PELVIS W CONTRAST  Result Date: 10/19/2022 CLINICAL DATA:  Abdominal pain; * Tracking Code: BO * EXAM: CT ABDOMEN AND PELVIS WITH CONTRAST TECHNIQUE: Multidetector CT imaging of the abdomen and pelvis was performed using the standard protocol following bolus administration of intravenous contrast. RADIATION DOSE REDUCTION: This exam was performed according to the departmental dose-optimization program which includes automated exposure control, adjustment of the mA and/or kV according to patient size and/or use of iterative reconstruction technique. CONTRAST:  65m OMNIPAQUE IOHEXOL 300 MG/ML  SOLN COMPARISON:  CT abdomen and pelvis dated October 21, 2009 FINDINGS: Lower chest: No acute abnormality. Hepatobiliary: No focal liver abnormality is seen. No gallstones, gallbladder wall thickening, or biliary dilatation. Pancreas: Multiloculated cystic lesion of the  pancreatic tail measuring 7.5 x 4.1 cm on series 2, image 24. No main pancreatic duct dilation. Spleen: Normal in size without focal abnormality. Adrenals/Urinary Tract: Bilateral adrenal glands are unremarkable. No hydronephrosis. Nonobstructing stones of the lower pole of the left kidney. No suspicious renal lesions. Bladder is unremarkable. Stomach/Bowel: Stomach is within normal limits. No evidence of bowel wall thickening, distention, or inflammatory changes. Vascular/Lymphatic: Mild aortic atherosclerosis. No enlarged abdominal or pelvic lymph nodes. Reproductive: Left-greater-than-right multiloculated cystic and solid masses of the bilateral ovaries measuring 8.2 x 8.3 x 10.1 cm on the left and 9.1 x 6.8 by 9.0 cm on the right. Other: Large volume abdominal ascites, peritoneal implants and omental caking. Reference peritoneal implant of the left paracolic gutter measuring 2.6 x 1.7 cm on series 2, image 29. Reference serosal implant located posterior to the wall of the stomach measuring 1.5 x 1.7 cm on image 30. Musculoskeletal: Tiny sclerotic lesions of the left sacrum are unchanged when compared with prior exam and likely due to bone islands. No aggressive appearing osseous lesions IMPRESSION: 1. Large volume abdominal ascites, peritoneal implants and omental caking, compatible with peritoneal carcinomatosis. 2. Left-greater-than-right multiloculated cystic and solid masses of the bilateral ovaries, differential considerations include primary ovarian malignancy or metastatic tumor to the ovaries. 3. Multiloculated cystic lesion of the pancreatic tail, concerning for primary pancreatic neoplasm versus metastatic disease. 4. Nonobstructing left renal stone. 5.  Aortic Atherosclerosis (ICD10-I70.0). Electronically Signed   By: LYetta GlassmanM.D.   On: 10/19/2022 15:49        Scheduled Meds:  docusate sodium  100 mg Oral BID   enoxaparin (LOVENOX) injection  40 mg Subcutaneous Daily   feeding  supplement  237 mL Oral TID BM   ferrous sulfate  325 mg Oral Q breakfast   folic acid  1 mg Oral Daily   Continuous Infusions:  lactated ringers Stopped (10/20/22 0824)   potassium chloride 10 mEq (10/21/22 1007)     LOS: 1 day   Time spent= 35 mins    Jewel Venditto CArsenio Loader MD [facility]  If 7PM-7AM, please contact night-coverage  10/21/2022, 11:43 AM

## 2022-10-22 DIAGNOSIS — R1084 Generalized abdominal pain: Secondary | ICD-10-CM | POA: Diagnosis not present

## 2022-10-22 DIAGNOSIS — N838 Other noninflammatory disorders of ovary, fallopian tube and broad ligament: Secondary | ICD-10-CM

## 2022-10-22 DIAGNOSIS — D378 Neoplasm of uncertain behavior of other specified digestive organs: Secondary | ICD-10-CM

## 2022-10-22 DIAGNOSIS — K8689 Other specified diseases of pancreas: Secondary | ICD-10-CM

## 2022-10-22 DIAGNOSIS — D398 Neoplasm of uncertain behavior of other specified female genital organs: Secondary | ICD-10-CM

## 2022-10-22 DIAGNOSIS — K5900 Constipation, unspecified: Secondary | ICD-10-CM

## 2022-10-22 DIAGNOSIS — C786 Secondary malignant neoplasm of retroperitoneum and peritoneum: Secondary | ICD-10-CM | POA: Diagnosis not present

## 2022-10-22 DIAGNOSIS — E46 Unspecified protein-calorie malnutrition: Secondary | ICD-10-CM

## 2022-10-22 DIAGNOSIS — R180 Malignant ascites: Secondary | ICD-10-CM

## 2022-10-22 DIAGNOSIS — Z720 Tobacco use: Secondary | ICD-10-CM

## 2022-10-22 LAB — CBC
HCT: 27.9 % — ABNORMAL LOW (ref 36.0–46.0)
Hemoglobin: 8.6 g/dL — ABNORMAL LOW (ref 12.0–15.0)
MCH: 23.3 pg — ABNORMAL LOW (ref 26.0–34.0)
MCHC: 30.8 g/dL (ref 30.0–36.0)
MCV: 75.6 fL — ABNORMAL LOW (ref 80.0–100.0)
Platelets: 427 10*3/uL — ABNORMAL HIGH (ref 150–400)
RBC: 3.69 MIL/uL — ABNORMAL LOW (ref 3.87–5.11)
RDW: 15.9 % — ABNORMAL HIGH (ref 11.5–15.5)
WBC: 9.5 10*3/uL (ref 4.0–10.5)
nRBC: 0 % (ref 0.0–0.2)

## 2022-10-22 LAB — COMPREHENSIVE METABOLIC PANEL
ALT: 16 U/L (ref 0–44)
AST: 14 U/L — ABNORMAL LOW (ref 15–41)
Albumin: 2.2 g/dL — ABNORMAL LOW (ref 3.5–5.0)
Alkaline Phosphatase: 71 U/L (ref 38–126)
Anion gap: 9 (ref 5–15)
BUN: 10 mg/dL (ref 6–20)
CO2: 28 mmol/L (ref 22–32)
Calcium: 8.4 mg/dL — ABNORMAL LOW (ref 8.9–10.3)
Chloride: 93 mmol/L — ABNORMAL LOW (ref 98–111)
Creatinine, Ser: 0.54 mg/dL (ref 0.44–1.00)
GFR, Estimated: >60 mL/min (ref >60)
Glucose, Bld: 146 mg/dL — ABNORMAL HIGH (ref 70–99)
Potassium: 4.1 mmol/L (ref 3.5–5.1)
Sodium: 130 mmol/L — ABNORMAL LOW (ref 135–145)
Total Bilirubin: 0.3 mg/dL (ref 0.3–1.2)
Total Protein: 6 g/dL — ABNORMAL LOW (ref 6.5–8.1)

## 2022-10-22 LAB — MISC LABCORP TEST (SEND OUT): Labcorp test code: 81950

## 2022-10-22 LAB — MAGNESIUM: Magnesium: 2 mg/dL (ref 1.7–2.4)

## 2022-10-22 LAB — PHOSPHORUS: Phosphorus: 3.5 mg/dL (ref 2.5–4.6)

## 2022-10-22 MED ORDER — POLYETHYLENE GLYCOL 3350 17 G PO PACK
17.0000 g | PACK | Freq: Two times a day (BID) | ORAL | Status: DC
  Administered 2022-10-22 – 2022-11-01 (×10): 17 g via ORAL
  Filled 2022-10-22 (×16): qty 1

## 2022-10-22 MED ORDER — SENNOSIDES-DOCUSATE SODIUM 8.6-50 MG PO TABS
2.0000 | ORAL_TABLET | Freq: Every day | ORAL | Status: DC
  Administered 2022-10-22 – 2022-11-01 (×7): 2 via ORAL
  Filled 2022-10-22 (×18): qty 2

## 2022-10-22 MED ORDER — ADULT MULTIVITAMIN W/MINERALS CH
1.0000 | ORAL_TABLET | Freq: Every day | ORAL | Status: DC
  Administered 2022-10-22 – 2022-10-27 (×4): 1 via ORAL
  Filled 2022-10-22 (×5): qty 1

## 2022-10-22 NOTE — Progress Notes (Signed)
Initial Nutrition Assessment  INTERVENTION:   -Ensure Enlive po TID, each supplement provides 350 kcal and 20 grams of protein.   -Multivitamin with minerals daily  NUTRITION DIAGNOSIS:   Increased nutrient needs related to cancer and cancer related treatments as evidenced by estimated needs.  GOAL:   Patient will meet greater than or equal to 90% of their needs  MONITOR:   PO intake, Supplement acceptance, I & O's, Labs, Weight trends  REASON FOR ASSESSMENT:   Consult Assessment of nutrition requirement/status  ASSESSMENT:   47 y.o. female with medical history significant of anxiety, HPV comes to the hospital at [facility] ED with complaints of abdominal distention and constipation. CT abdomen pelvis showed peritoneal carcinomatosis with extensive metastases, ascites and multiloculated cystic lesion of the pancreatic tail.  GYN oncology consulted, underwent paracentesis, 3.5 L was removed.  Patient in room, no family yet in room. Pt reports eating well unless she has fluid built up. Felt a lot better following paracentesis on 2/3. Pt has also not had a BM. Currently trying to drink prune juice and ate some yogurt. She has been drinking Ensure supplements as well for protein. At end of visit, family had brought pt Premier Protein shakes to drink as well.  Pt states her UBW ~170 lbs. In November noticed her weight had dropped to ~160 lbs. Current weight: 153 lbs.  Medications: Ferrous sulfate, Folic acid, Miralax, Senokot  Labs reviewed:  Low Na Low iron Low folate  NUTRITION - FOCUSED PHYSICAL EXAM:  Unable to complete at this time.  Diet Order:   Diet Order             Diet regular Room service appropriate? Yes; Fluid consistency: Thin  Diet effective now                   EDUCATION NEEDS:   No education needs have been identified at this time  Skin:  Skin Assessment: Reviewed RN Assessment  Last BM:  1/27  Height:   Ht Readings from Last 1  Encounters:  10/19/22 '4\' 11"'$  (1.499 m)    Weight:   Wt Readings from Last 1 Encounters:  10/19/22 69.4 kg    BMI:  Body mass index is 30.9 kg/m.  Estimated Nutritional Needs:   Kcal:  1500-1700  Protein:  75-90g  Fluid:  1.5L/day   Clayton Bibles, MS, RD, LDN Inpatient Clinical Dietitian Contact information available via Amion

## 2022-10-22 NOTE — TOC Progression Note (Signed)
Transition of Care [facility]) - Progression Note    Patient Details  Name: Christina [name] MRN: [id number] Date of Birth: 04-Jan-1976  Transition of Care [facility]) CM/SW [place], RN Phone [phone]  10/22/2022, 1:27 PM  Clinical Narrative:     Transition of Care [facility]) Screening Note   Patient Details  Name: Christina [name] Date of Birth: Sep 24, 1975   Transition of Care [facility]) CM/SW Contact:    Angelita Ingles, RN Phone Number: 10/22/2022, 1:28 PM    Transition of Care Department [facility]) has reviewed patient and no TOC needs have been identified at this time. We will continue to monitor patient advancement through interdisciplinary progression rounds. If new patient transition needs arise, please place a TOC consult.          Expected Discharge Plan and Services                                               Social Determinants of Health (SDOH) Interventions SDOH Screenings   Food Insecurity: No Food Insecurity (10/20/2022)  Housing: Low Risk  (10/20/2022)  Transportation Needs: No Transportation Needs (10/20/2022)  Utilities: Not At Risk (10/20/2022)  Tobacco Use: High Risk (10/19/2022)    Readmission Risk Interventions     No data to display

## 2022-10-22 NOTE — Progress Notes (Signed)
PROGRESS NOTE    Christina [name]  ZOX:[id number] DOB: [birth year] DOA: 10/19/2022 PCP: [name], MD   Brief Narrative:  47 y.o. female with medical history significant of anxiety, HPV comes to the hospital at [facility] ED with complaints of abdominal distention and constipation. CT abdomen pelvis showed peritoneal carcinomatosis with extensive metastases, ascites and multiloculated cystic lesion of the pancreatic tail.  GYN oncology consulted, underwent paracentesis, 3.5 L was removed.   Assessment & Plan:  Principal Problem:   Peritoneal carcinomatosis ([place]) Active Problems:   S/P cesarean section      Abdominal pain peritoneal carcinomatosis,  ascites, likely malignant -There is concerns of extensive metastic disease with unknown primary seen on CT scan.  This is a new diagnosis.  Underwent paracentesis on 2/3 about 3.5 L was removed.  No obvious evidence of infection was noted but cytology was sent.  If cytology is unrevealing then she will need omental biopsy.  GYN oncology consulted - CEA, CA 19-9 and Ca125-pending -Ascites seems to be coming back but we will try and hold off on paracentesis today but will continue to monitor her.   Microcytic anemia, iron deficiency Folic acid deficiency - Iron supplements with bowel regimen.  Folic acid supplements   Moderate protein calorie malnutrition - Nutrition consult.  Oral supplements.  Encourage oral diet.  Hypokalemia/hypomagnesemia - As needed repletion  Bilateral lower extremity rash on her knees - Tells me is pruritic and it appeared about 3 days ago.  Started on Benadryl cream, will continue to monitor   Patient understands overall she may have poor diagnosis but at this time we will await for cytology results         DVT prophylaxis: Lovenox Code Status: Full code Family Communication: Patient's family is at bedside.  Status is: Inpatient Maintain hospital stay for ongoing evaluation of knee diagnosis of  peritoneal carcinomatosis      Subjective: Seen and examined at bedside.  Patient says her abdominal feels more distended.  Family is present at bedside.  With her permission family has been fully updated.  Examination:  Constitutional: Not in acute distress Respiratory: Clear to auscultation bilaterally Cardiovascular: Normal sinus rhythm, no rubs Abdomen: Abdomen slightly distended with positive fluid wave shift.  Nontender Musculoskeletal: No edema noted Skin: No rashes seen Neurologic: CN 2-12 grossly intact.  And nonfocal Psychiatric: Normal judgment and insight. Alert and oriented x 3. Normal mood. Objective: Vitals:   10/21/22 1500 10/21/22 1600 10/21/22 2133 10/22/22 0551  BP: (!) 175/107 (!) 142/90 (!) 153/85 (!) 161/102  Pulse: (!) 101 100 86 88  Resp: '20  17 17  '$ Temp: 98 F (36.7 C)  98.4 F (36.9 C) 98.2 F (36.8 C)  TempSrc: Oral  Oral Oral  SpO2: 99%  98% 98%  Weight:      Height:       No intake or output data in the 24 hours ending 10/22/22 1133  Filed Weights   10/19/22 1156  Weight: 69.4 kg     Data Reviewed:   CBC: Recent Labs  Lab 10/19/22 1230 10/20/22 0836 10/21/22 0617 10/22/22 0533  WBC 11.9* 11.9* 9.6 9.5  HGB 9.4* 9.5* 9.0* 8.6*  HCT 30.1* 30.4* 29.5* 27.9*  MCV 74.5* 75.4* 76.0* 75.6*  PLT 473* 471* 381 811*   Basic Metabolic Panel: Recent Labs  Lab 10/19/22 1230 10/20/22 0836 10/21/22 0617 10/22/22 0533  NA 133* 132* 133* 130*  K 3.1* 3.4* 3.4* 4.1  CL 89* 91* 95* 93*  CO2 32 '29 27 28  '$ GLUCOSE 168* 136* 138* 146*  BUN '13 13 12 10  '$ CREATININE 0.69 0.71 0.66 0.54  CALCIUM 9.6 8.9 8.2* 8.4*  MG  --   --  1.9 2.0  PHOS  --   --   --  3.5   GFR: Estimated Creatinine Clearance: 72.9 mL/min (by C-G formula based on SCr of 0.54 mg/dL). Liver Function Tests: Recent Labs  Lab 10/19/22 1230 10/20/22 0836 10/21/22 0617 10/22/22 0533  AST 7* 12* 12* 14*  ALT '10 13 13 16  '$ ALKPHOS 71 66 58 71  BILITOT 0.4 0.5 0.5 0.3   PROT 7.3 6.5 5.3* 6.0*  ALBUMIN 3.7 2.7* 2.2* 2.2*   Recent Labs  Lab 10/19/22 1230  LIPASE <10*   No results for input(s): "AMMONIA" in the last 168 hours. Coagulation Profile: Recent Labs  Lab 10/20/22 0836  INR 1.1   Cardiac Enzymes: No results for input(s): "CKTOTAL", "CKMB", "CKMBINDEX", "TROPONINI" in the last 168 hours. BNP (last 3 results) No results for input(s): "PROBNP" in the last 8760 hours. HbA1C: No results for input(s): "HGBA1C" in the last 72 hours. CBG: No results for input(s): "GLUCAP" in the last 168 hours. Lipid Profile: No results for input(s): "CHOL", "HDL", "LDLCALC", "TRIG", "CHOLHDL", "LDLDIRECT" in the last 72 hours. Thyroid Function Tests: Recent Labs    10/20/22 0836  TSH 3.171   Anemia Panel: Recent Labs    10/20/22 0836 10/21/22 0617  VITAMINB12 1,037*  --   FOLATE  --  4.5*  FERRITIN 42  --   TIBC 255  --   IRON 15*  --    Sepsis Labs: No results for input(s): "PROCALCITON", "LATICACIDVEN" in the last 168 hours.  Recent Results (from the past 240 hour(s))  Aerobic/Anaerobic Culture w Gram Stain (surgical/deep wound)     Status: None (Preliminary result)   Collection Time: 10/20/22  9:45 AM   Specimen: PATH Cytology Peritoneal fluid  Result Value Ref Range Status   Specimen Description   Final    PERITONEAL Performed at [facility] [address], [place], [place] [address]    Special Requests   Final    NONE Performed at [facility], [place] [address] [place], [place] [address]    Gram Stain   Final    RARE WBC PRESENT, PREDOMINANTLY PMN NO ORGANISMS SEEN    Culture   Final    NO GROWTH 2 DAYS NO ANAEROBES ISOLATED; CULTURE IN PROGRESS FOR 5 DAYS Performed at [facility] [address] [place], [place] [address]    Report Status PENDING  Incomplete         Radiology Studies: No results found.      Scheduled Meds:  enoxaparin (LOVENOX) injection  40 mg  Subcutaneous Daily   feeding supplement  237 mL Oral TID BM   ferrous sulfate  325 mg Oral Q breakfast   folic acid  1 mg Oral Daily   polyethylene glycol  17 g Oral BID   senna-docusate  2 tablet Oral QHS   Continuous Infusions:  lactated ringers Stopped (10/20/22 0824)     LOS: 2 days   Time spent= 35 mins    Kaylana Fenstermacher Arsenio Loader, MD [facility]  If 7PM-7AM, please contact night-coverage  10/22/2022, 11:33 AM

## 2022-10-22 NOTE — Consult Note (Signed)
GYNECOLOGIC ONCOLOGY INPATIENT CONSULTATION  Date of Service: 10/22/22 Requesting Service: [facility] Requesting Provider: Dr. Gerlean Ren Consulting Provider: Jeral Pinch, MD  HISTORY OF PRESENT ILLNESS: Christina [name] is a 47 y.o. woman who is seen in consultation at the request of Dr. Reesa Chew for evaluation of ascites, bilateral cystic and solid ovarian masses, and pancreatic lesion noted on recent CT imaging.  She presented to the ED on 2/2 with diffuse abdominal pain, constipation (no bowel movement in over a week), decreased PO, and unintentional weight loss (40 lbs in the last 3 months).  Initial labs were notable for mild leukocytosis, iron and folate-deficiency anemia, thrombocytosis, mild hyponatremia, and hypokalemia. CA-125, CEA and CA 19-9 were drawn and still pending. After IV hydration, hypoalbuminemia also noted (2.2).   She had a CT scan of the [facility] with contrast revealing:  1. Large volume abdominal ascites, peritoneal implants and omental caking, compatible with peritoneal carcinomatosis. 2. Left-greater-than-right multiloculated cystic and solid masses of the bilateral ovaries, differential considerations include primary ovarian malignancy or metastatic tumor to the ovaries. 3. Multiloculated cystic lesion of the pancreatic tail, concerning for primary pancreatic neoplasm versus metastatic disease. 4. Nonobstructing left renal stone. 5.  Aortic Atherosclerosis  On 2/3, she underwent paracentesis with removal of 3.5L of ascites.   Her past medical history includes a seizure disorder diagnosed in 2001 with her last grand mal seizure in 2001.  She had an extensive workup for this and it was felt to be stress related.  Currently she reports having anxiety, intermittent panic attacks.  She has had 5 abdominal surgeries: 3 c sections, appendectomy, tubal one year after last c section. Family history includes mat grandmother (still alive in 31s) with uterine cancer five years  ago. Patient's mother with hx of breast cancer with no treatments per pt, died at 37 from [place]. Father had head and neck cancer ([place]), passed at 46. Maternal uncle had pancreatic cancer, died early. Her mother's older sister with breast cancer. Mat grandfather had a heart attack at 57. Pat grandfather died in 26.  She experienced a miscarriage in 71s. G4P3. Gestational diabetes with her pregnancies. She has 3 boys (7, 8, 15). She reports missing a lot of work in the fall due to having viral illnesses. LOA from work in Jan due to ongoing symptoms. 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  Interval: The patient states coming to the ER because of constipation and concern for impaction. She started having issues with constipation in summer 2023 and began using laxatives including ex-lax and mag citrate. In January of this year, she drank two bottles of mag citrate with no results. She has had to disimpact herself intermittently. In the fall of 2023, she began having back pain and hip pain. She felt she may have injured herself after helping a friend move but her symptoms persisted. She was initially using bayer back and body with tylenol. In November, she stopped bayer and was put on prednisone and a muscle relaxer. She had some relief but then her symptoms returned. She was then started on mobic and robaxin. She also underwent steroid injections in Jan 2024 in bilateral hips.  She has lost 40 lbs since November 2023. She weighed 167 lbs at end of December 2023 and 143 lbs when she came in to the ER this admission. She began noticing abdominal distension with her belly button protruding. She has not been able to sleep due to being uncomfortable. She reports abdominal pain  and pressure making it "impossible to do anything." She reports pain and pressure buildup when ambulating and sitting. She has had increased belching. Appetite has been poor. Prior to admission, she reports throwing up stomach  bile every day. When she did eat, food would come back up a couple hours later with 2-3 episodes a day throwing up bile. Intermittent nausea. She reports being able to empty bladder, voiding frequently. She reports shortness of breath when abdomen is distended and feelings of not being able to take a deep breath.   She continues to have her menstrual cycle monthly with the time shortened from 3 to 5 days to day and a half. No bleeding or discharge in between. 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 Hx of chlamydia-treated in 2006. Never had mammo or colonoscopy. Typically she smokes a pack a week of cigarettes. Recently, she has only smoked 4 packs since Thanksgiving with no desire or cravings. She feels "ok" now in regards to craving and does not feel she needs a patch. She states she stopped drinking one year ago after finding out she had sludge in her gallbladder and fatty liver. She was drinking 1-2 beers at night, not every night. Using cannabis and CBD due to pain. Of note, oxygen level has dropped with prev procedures and anesthesia.  PAST MEDICAL HISTORY: Past Medical History:  Diagnosis Date   Anxiety    Depression    Gestational diabetes mellitus, antepartum 2015   managed with diet   Hypertension    on meds until wt loss   PONV (postoperative nausea and vomiting)    Seizures ([place]) 2001   stressed induced; 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    PAST SURGICAL HISTORY: Past Surgical History:  Procedure Laterality Date   APPENDECTOMY     CESAREAN SECTION     CESAREAN SECTION N/A 07/14/2014   Procedure: CESAREAN SECTION;  Surgeon: Cheri Fowler, MD;  Location: [facility];  Service: Obstetrics;  Laterality: N/A;   CESAREAN SECTION N/A 05/23/2015   Procedure: CESAREAN SECTION;  Surgeon: Cheri Fowler, MD;  Location: [facility];  Service: Obstetrics;  Laterality: N/A;   LAPAROSCOPIC BILATERAL SALPINGECTOMY Bilateral 06/12/2016   Procedure: LAPAROSCOPIC  BILATERAL SALPINGECTOMY;  Surgeon: Jonnie Kind, MD;  Location: [facility];  Service: Gynecology;  Laterality: Bilateral;    OB/GYN HISTORY: OB History  Gravida Para Term Preterm AB Living  '4 3 3   1 3  '$ SAB IAB Ectopic Multiple Live Births  1     0 3    # Outcome Date GA Lbr Len/2nd Weight Sex Delivery Anes PTL Lv  4 Term 05/23/15 [redacted]w[redacted]d 7 lb 0.2 oz (3.18 kg) M CS-LTranv Gen  LIV  3 Term 07/14/14 [id number] M CS-LTranv Spinal  LIV  2 Term 2009    M CS-LTranv EPI  LIV  1 SAB 2001             Age at menopause: Premenopausal currently Hx of STDs: Chlamydia treated in 2006 Last pap: 2017 History of abnormal pap smears: 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 + in 2017  SCREENING STUDIES:  Last mammogram: Never had Last colonoscopy: Never had  MEDICATIONS:  Current Facility-Administered Medications:    acetaminophen (TYLENOL) tablet 650 mg, 650 mg, Oral, Q6H PRN, RaShela LeffMD   diphenhydrAMINE-zinc acetate (BENADRYL) 2-0.1 % cream, , Topical, TID PRN, AmDamita LackMD, Given at 10/21/22 2111   docusate sodium (COLACE) capsule 100  mg, 100 mg, Oral, BID, Amin, Ankit Chirag, MD, 100 mg at 10/21/22 2110   enoxaparin (LOVENOX) injection 40 mg, 40 mg, Subcutaneous, Daily, Amin, Ankit Chirag, MD, 40 mg at 10/21/22 0940   feeding supplement (ENSURE ENLIVE / ENSURE PLUS) liquid 237 mL, 237 mL, Oral, TID BM, Amin, Ankit Chirag, MD, 237 mL at 10/21/22 2004   ferrous sulfate tablet 325 mg, 325 mg, Oral, Q breakfast, Amin, Ankit Chirag, MD, 325 mg at 16/10/96 0454   folic acid (FOLVITE) tablet 1 mg, 1 mg, Oral, Daily, Amin, Ankit Chirag, MD, 1 mg at 10/21/22 0940   guaiFENesin (ROBITUSSIN) 100 MG/5ML liquid 5 mL, 5 mL, Oral, Q4H PRN, Amin, Ankit Chirag, MD   hydrALAZINE (APRESOLINE) injection 10 mg, 10 mg, Intravenous, Q4H PRN, Amin, Ankit Chirag, MD   HYDROcodone-acetaminophen (NORCO/VICODIN) 5-325 MG per tablet 1-2 tablet, 1-2 tablet, Oral, Q4H PRN, Damita Lack, MD, 2 tablet at 10/22/22 0511    HYDROmorphone (DILAUDID) injection 0.5-1 mg, 0.5-1 mg, Intravenous, Q2H PRN, Amin, Ankit Chirag, MD, 1 mg at 10/22/22 0028   ipratropium-albuterol (DUONEB) 0.5-2.5 (3) MG/3ML nebulizer solution 3 mL, 3 mL, Nebulization, Q4H PRN, Amin, Jeanella Flattery, MD   lactated ringers infusion, , Intravenous, Continuous, [name], MD, Stopped at 10/20/22 [id number]   metoprolol tartrate (LOPRESSOR) injection 5 mg, 5 mg, Intravenous, Q4H PRN, Amin, Ankit Chirag, MD   naloxone (NARCAN) injection 0.4 mg, 0.4 mg, Intravenous, PRN, Shela Leff, MD   ondansetron (ZOFRAN) injection 4 mg, 4 mg, Intravenous, Q6H PRN, Amin, Ankit Chirag, MD   oxyCODONE (Oxy IR/ROXICODONE) immediate release tablet 5 mg, 5 mg, Oral, Q4H PRN, Amin, Ankit Chirag, MD, 5 mg at 10/21/22 2000   senna-docusate (Senokot-S) tablet 1 tablet, 1 tablet, Oral, QHS PRN, Amin, Ankit Chirag, MD   traZODone (DESYREL) tablet 50 mg, 50 mg, Oral, QHS PRN, Amin, Ankit Chirag, MD  ALLERGIES: Allergies  Allergen Reactions   Claritin [Loratadine] Other (See Comments)    causes spasms   Diphenhydramine Other (See Comments)    Causes spasms and keeps alert for days   Lorazepam Other (See Comments)    After a few doses it made her feel paranoid    FAMILY HISTORY: Family History  Problem Relation Age of Onset   Cancer Father    Hypertension Mother    Diabetes Mother    Autism Son     SOCIAL HISTORY: Social History   Socioeconomic History   Marital status: Legally Separated    Spouse name: Not on file   Number of children: Not on file   Years of education: Not on file   Highest education level: Not on file  Occupational History   Not on file  Tobacco Use   Smoking status: Every Day    Packs/day: 1.00    Years: 12.00    Total pack years: 12.00    Types: Cigarettes   Smokeless tobacco: Never  Substance and Sexual Activity   Alcohol use: Yes    Comment: rarely   Drug use: No    Comment: not during pregnancy   Sexual activity: Not  Currently    Partners: Male    Birth control/protection: None  Other Topics Concern   Not on file  Social History Narrative   Not on file   Social Determinants of Health   Financial Resource Strain: Not on file  Food Insecurity: No Food Insecurity (10/20/2022)   Hunger Vital Sign    Worried About Running Out of Food in the Last Year:  Never true    Ran Out of Food in the Last Year: Never true  Transportation Needs: No Transportation Needs (10/20/2022)   PRAPARE - Hydrologist (Medical): No    Lack of Transportation (Non-Medical): No  Physical Activity: Not on file  Stress: Not on file  Social Connections: Not on file  Intimate Partner Violence: Not At Risk (10/20/2022)   Humiliation, Afraid, Rape, and Kick questionnaire    Fear of Current or Ex-Partner: No    Emotionally Abused: No    Physically Abused: No    Sexually Abused: No    REVIEW OF SYSTEMS: Complete 10-system review is negative except for the following: see interval  PHYSICAL EXAM (performed by Dr. Berline [name]): BP (!) 161/102 (BP Location: Left Arm)   Pulse 88   Temp 98.2 F (36.8 C) (Oral)   Resp 17   Ht '4\' 11"'$  (1.499 m)   Wt 153 lb (69.4 kg)   SpO2 98%   BMI 30.90 kg/m  General: Well developed, female in no acute distress. Alert and oriented x 3.  Cardiovascular: Regular rate and rhythm. S1 and S2 normal.  Lungs: Clear to auscultation bilaterally. No wheezes/crackles/rhonchi noted.  Skin: No rashes or lesions present.  Abdomen: Abdomen distended, tympanic. Hypoactive bowel sounds in all quadrants. Fluid wave present. Genitourinary: Deferred, plan to bring patient to the [facility] on Wednesday, Feb 7 for examination with Dr. Berline [name] Rectal: Deferred Extremities: No bilateral cyanosis, edema, or clubbing.   LABORATORY AND RADIOLOGIC DATA:    Latest Ref Rng & Units 10/22/2022    5:33 AM 10/21/2022    6:17 AM 10/20/2022    8:36 AM  CBC  WBC 4.0 - 10.5 K/uL 9.5  9.6  11.9    Hemoglobin 12.0 - 15.0 g/dL 8.6  9.0  9.5   Hematocrit 36.0 - 46.0 % 27.9  29.5  30.4   Platelets 150 - 400 K/uL 427  381  471       Latest Ref Rng & Units 10/22/2022    5:33 AM 10/21/2022    6:17 AM 10/20/2022    8:36 AM  BMP  Glucose 70 - 99 mg/dL 146  138  136   BUN 6 - 20 mg/dL '10  12  13   '$ Creatinine 0.44 - 1.00 mg/dL 0.54  0.66  0.71   Sodium 135 - 145 mmol/L 130  133  132   Potassium 3.5 - 5.1 mmol/L 4.1  3.4  3.4   Chloride 98 - 111 mmol/L 93  95  91   CO2 22 - 32 mmol/L '28  27  29   '$ Calcium 8.9 - 10.3 mg/dL 8.4  8.2  8.9    HIV NR  Studies on ascites: Cytology pending LDH 144 Albumin 2.4 Protein 3.8 Glucose 121 Aerobic/anaerobic culture: NGTD, Rare WBC present, no organisms seen  CT A/P w/ contrast on 10/19/22: 1. Large volume abdominal ascites, peritoneal implants and omental caking, compatible with peritoneal carcinomatosis. 2. Left-greater-than-right multiloculated cystic and solid masses of the bilateral ovaries, differential considerations include primary ovarian malignancy or  metastatic tumor to the ovaries. 3. Multiloculated cystic lesion of the pancreatic tail, concerning for primary pancreatic neoplasm versus metastatic disease. 4. Nonobstructing left renal stone. 5.  Aortic Atherosclerosis (ICD10-I70.0).  ASSESSMENT AND PLAN: Christina [name] is a 47 y.o. woman currently admitted with ascites, bilateral cystic and solid ovarian masses, and pancreatic lesion noted on recent CT imaging. Findings concerning for ovarian cancer, but  given her family history and findings within the pancreas on CT, pancreatic cancer should be considered as well. Awaiting cytology. If fluid from recent paracentesis provides a diagnosis, then no biopsy needed. If not, plan for biopsy with IR.     Typical treatment approach for ovarian cancer discussed with patient by Dr. Berline [name] with a combination of cytoreductive surgery and chemotherapy recommended. Dr. Berline [name] discussed that sequencing of  this can be either with upfront debulking followed by adjuvant chemotherapy sequentially or neoadjuvant chemotherapy followed by an interval cytoreductive attempt, then additional chemotherapy. This latter approach is associated with a reduced perioperative morbidity at the time of surgery.  Dr. Berline [name] discussed that decisions regarding sequencing of therapy is individualized taking into account individual patient health, in addition to the apparent tumor distribution on imaging, and likelihood of complete surgical resection at the time of surgery. Given her nutritional status and concern that complete cytoreduction would not be possible at this time, neoadjuvant chemotherapy discussed by Dr. Berline [name] if ovarian cancer confirmed.   We reviewed that chemotherapy will be administered for 3 cycles followed by repeat imaging.  We will follow the patient CA-125 in that time.  Depending on imaging response after 3 cycles, we will have a subsequent conversation about her suitability for interval debulking surgery. All questions answered.

## 2022-10-23 ENCOUNTER — Inpatient Hospital Stay (HOSPITAL_COMMUNITY): Payer: Medicaid Other

## 2022-10-23 ENCOUNTER — Telehealth: Payer: Self-pay | Admitting: Oncology

## 2022-10-23 DIAGNOSIS — C786 Secondary malignant neoplasm of retroperitoneum and peritoneum: Secondary | ICD-10-CM | POA: Diagnosis not present

## 2022-10-23 LAB — COMPREHENSIVE METABOLIC PANEL
ALT: 18 U/L (ref 0–44)
AST: 14 U/L — ABNORMAL LOW (ref 15–41)
Albumin: 2.2 g/dL — ABNORMAL LOW (ref 3.5–5.0)
Alkaline Phosphatase: 78 U/L (ref 38–126)
Anion gap: 10 (ref 5–15)
BUN: 12 mg/dL (ref 6–20)
CO2: 29 mmol/L (ref 22–32)
Calcium: 8.8 mg/dL — ABNORMAL LOW (ref 8.9–10.3)
Chloride: 95 mmol/L — ABNORMAL LOW (ref 98–111)
Creatinine, Ser: 0.56 mg/dL (ref 0.44–1.00)
GFR, Estimated: >60 mL/min (ref >60)
Glucose, Bld: 150 mg/dL — ABNORMAL HIGH (ref 70–99)
Potassium: 4 mmol/L (ref 3.5–5.1)
Sodium: 134 mmol/L — ABNORMAL LOW (ref 135–145)
Total Bilirubin: 0.2 mg/dL — ABNORMAL LOW (ref 0.3–1.2)
Total Protein: 6 g/dL — ABNORMAL LOW (ref 6.5–8.1)

## 2022-10-23 LAB — CBC
HCT: 28.5 % — ABNORMAL LOW (ref 36.0–46.0)
Hemoglobin: 8.7 g/dL — ABNORMAL LOW (ref 12.0–15.0)
MCH: 23.3 pg — ABNORMAL LOW (ref 26.0–34.0)
MCHC: 30.5 g/dL (ref 30.0–36.0)
MCV: 76.2 fL — ABNORMAL LOW (ref 80.0–100.0)
Platelets: 462 10*3/uL — ABNORMAL HIGH (ref 150–400)
RBC: 3.74 MIL/uL — ABNORMAL LOW (ref 3.87–5.11)
RDW: 16.1 % — ABNORMAL HIGH (ref 11.5–15.5)
WBC: 9.7 10*3/uL (ref 4.0–10.5)
nRBC: 0 % (ref 0.0–0.2)

## 2022-10-23 LAB — TRIGLYCERIDES, BODY FLUIDS: Triglycerides, Fluid: 33 mg/dL

## 2022-10-23 LAB — MAGNESIUM: Magnesium: 2 mg/dL (ref 1.7–2.4)

## 2022-10-23 LAB — CEA: CEA: 84 ng/mL — ABNORMAL HIGH (ref 0.0–4.7)

## 2022-10-23 LAB — CA 125: Cancer Antigen (CA) 125: 314 U/mL — ABNORMAL HIGH (ref 0.0–38.1)

## 2022-10-23 LAB — CANCER ANTIGEN 19-9: CA 19-9: 25 U/mL (ref 0–35)

## 2022-10-23 MED ORDER — LIDOCAINE HCL 1 % IJ SOLN
INTRAMUSCULAR | Status: AC
  Administered 2022-10-23: 15 mL
  Filled 2022-10-23: qty 20

## 2022-10-23 MED ORDER — PROSOURCE PLUS PO LIQD
30.0000 mL | Freq: Two times a day (BID) | ORAL | Status: DC
  Administered 2022-10-24 – 2022-10-29 (×7): 30 mL via ORAL
  Filled 2022-10-23 (×12): qty 30

## 2022-10-23 MED ORDER — DOCUSATE SODIUM 100 MG PO CAPS
100.0000 mg | ORAL_CAPSULE | Freq: Two times a day (BID) | ORAL | Status: DC
  Administered 2022-10-23 – 2022-10-24 (×3): 100 mg via ORAL
  Filled 2022-10-23 (×4): qty 1

## 2022-10-23 NOTE — Progress Notes (Signed)
PROGRESS NOTE    Christina [name]  WPY:[id number] DOB: Jul 30, 1976 DOA: 10/19/2022 PCP: [name], MD   Brief Narrative:  47 y.o. female with medical history significant of anxiety, HPV comes to the hospital at [facility] with complaints of abdominal distention and constipation. CT abdomen pelvis showed peritoneal carcinomatosis with extensive metastases, ascites and multiloculated cystic lesion of the pancreatic tail.  GYN oncology consulted, underwent paracentesis, 3.5 L was removed.  Tumor marker shows elevated CA125 and CEA, CA 19 9 is normal.  Seen by GYN oncology.  Still awaiting biopsy result from paracentesis.  If this ends up being nondiagnostic, she will need omental biopsy.   Assessment & Plan:  Principal Problem:   Peritoneal carcinomatosis ([place]) Active Problems:   S/P cesarean section   Generalized abdominal pain   Constipation   Ovarian mass   Pancreatic mass   Tobacco abuse   Malignant ascites   Protein-calorie malnutrition ([facility])      Abdominal pain peritoneal carcinomatosis,  ascites, likely malignant -There is concerns of extensive metastic disease with unknown primary seen on CT scan.  This is a new diagnosis.  Underwent paracentesis on 2/3 about 3.5 L was removed.  At this time cytology from paracentesis is still pending.  If it ends up being nondiagnostic, patient will require tissue diagnosis/omental biopsy.  GYN oncology following.  Appreciate input. - CEA and CA125 are elevated, CA 19-9 is normal. - Ascites seems to be coming back, very likely this is malignant and will require recurrent paracentesis.  Due to poor oral intake and feeling of nausea will go ahead and order repeat paracentesis   Microcytic anemia, iron deficiency Folic acid deficiency - Iron supplements with bowel regimen.  Folic acid supplements   Moderate protein calorie malnutrition - Nutrition consult.  Oral supplements.  Encourage oral diet.  She really needs to work on her nutrition in  order to be able to tolerate any future treatment plans including chemo and surgery.  Hypokalemia/hypomagnesemia - As needed repletion  Bilateral lower extremity rash on her knees, stable - Tells me is pruritic and it appeared about 3 days ago.  Benadryl cream, will continue to monitor    DVT prophylaxis: Lovenox Code Status: Full code Family Communication:  Status is: Inpatient Maintain hospital stay for ongoing evaluation of new diagnosis of peritoneal carcinomatosis    Subjective: Patient reports increasing abdominal distention and due to this she is having some feeling of nausea and poor oral intake.  Examination: Constitutional: Not in acute distress Respiratory: Clear to auscultation bilaterally Cardiovascular: Normal sinus rhythm, no rubs Abdomen: Abdominal distention with fluid wave shift Musculoskeletal: No edema noted Skin: No rashes seen Neurologic: CN 2-12 grossly intact.  And nonfocal Psychiatric: Normal judgment and insight. Alert and oriented x 3. Normal mood.  Objective: Vitals:   10/22/22 0551 10/22/22 1546 10/22/22 2130 10/23/22 0553  BP: (!) 161/102 (!) 125/98 (!) 153/92 (!) 147/94  Pulse: 88 98 90 94  Resp: '17 18 18 17  '$ Temp: 98.2 F (36.8 C) 98.2 F (36.8 C) 98.3 F (36.8 C) 98.3 F (36.8 C)  TempSrc: Oral Oral Oral Oral  SpO2: 98% 100% 98% 98%  Weight:      Height:        Intake/Output Summary (Last 24 hours) at 10/23/2022 0817 Last data filed at 10/22/2022 1500 Gross per 24 hour  Intake 480 ml  Output --  Net 480 ml    Filed Weights   10/19/22 1156  Weight: 69.4 kg  Data Reviewed:   CBC: Recent Labs  Lab 10/19/22 1230 10/20/22 0836 10/21/22 0617 10/22/22 0533 10/23/22 0523  WBC 11.9* 11.9* 9.6 9.5 9.7  HGB 9.4* 9.5* 9.0* 8.6* 8.7*  HCT 30.1* 30.4* 29.5* 27.9* 28.5*  MCV 74.5* 75.4* 76.0* 75.6* 76.2*  PLT 473* 471* 381 427* 518*   Basic Metabolic Panel: Recent Labs  Lab 10/19/22 1230 10/20/22 0836 10/21/22 0617  10/22/22 0533 10/23/22 0523  NA 133* 132* 133* 130* 134*  K 3.1* 3.4* 3.4* 4.1 4.0  CL 89* 91* 95* 93* 95*  CO2 32 '29 27 28 29  '$ GLUCOSE 168* 136* 138* 146* 150*  BUN '13 13 12 10 12  '$ CREATININE 0.69 0.71 0.66 0.54 0.56  CALCIUM 9.6 8.9 8.2* 8.4* 8.8*  MG  --   --  1.9 2.0 2.0  PHOS  --   --   --  3.5  --    GFR: Estimated Creatinine Clearance: 72.9 mL/min (by C-G formula based on SCr of 0.56 mg/dL). Liver Function Tests: Recent Labs  Lab 10/19/22 1230 10/20/22 0836 10/21/22 0617 10/22/22 0533 10/23/22 0523  AST 7* 12* 12* 14* 14*  ALT '10 13 13 16 18  '$ ALKPHOS 71 66 58 71 78  BILITOT 0.4 0.5 0.5 0.3 0.2*  PROT 7.3 6.5 5.3* 6.0* 6.0*  ALBUMIN 3.7 2.7* 2.2* 2.2* 2.2*   Recent Labs  Lab 10/19/22 1230  LIPASE <10*   No results for input(s): "AMMONIA" in the last 168 hours. Coagulation Profile: Recent Labs  Lab 10/20/22 0836  INR 1.1   Cardiac Enzymes: No results for input(s): "CKTOTAL", "CKMB", "CKMBINDEX", "TROPONINI" in the last 168 hours. BNP (last 3 results) No results for input(s): "PROBNP" in the last 8760 hours. HbA1C: No results for input(s): "HGBA1C" in the last 72 hours. CBG: No results for input(s): "GLUCAP" in the last 168 hours. Lipid Profile: No results for input(s): "CHOL", "HDL", "LDLCALC", "TRIG", "CHOLHDL", "LDLDIRECT" in the last 72 hours. Thyroid Function Tests: Recent Labs    10/20/22 0836  TSH 3.171   Anemia Panel: Recent Labs    10/20/22 0836 10/21/22 0617  VITAMINB12 1,037*  --   FOLATE  --  4.5*  FERRITIN 42  --   TIBC 255  --   IRON 15*  --    Sepsis Labs: No results for input(s): "PROCALCITON", "LATICACIDVEN" in the last 168 hours.  Recent Results (from the past 240 hour(s))  Aerobic/Anaerobic Culture w Gram Stain (surgical/deep wound)     Status: None (Preliminary result)   Collection Time: 10/20/22  9:45 AM   Specimen: PATH Cytology Peritoneal fluid  Result Value Ref Range Status   Specimen Description   Final     PERITONEAL Performed at [facility] [address], [place], [place] [address]    Special Requests   Final    NONE Performed at [facility], [employer] [address], [place], [place] [address]    Gram Stain   Final    RARE WBC PRESENT, PREDOMINANTLY PMN NO ORGANISMS SEEN    Culture   Final    NO GROWTH 2 DAYS NO ANAEROBES ISOLATED; CULTURE IN PROGRESS FOR 5 DAYS Performed at [facility] [address] [place], [place] [address]    Report Status PENDING  Incomplete         Radiology Studies: No results found.      Scheduled Meds:  enoxaparin (LOVENOX) injection  40 mg Subcutaneous Daily   feeding supplement  237  mL Oral TID BM   ferrous sulfate  325 mg Oral Q breakfast   folic acid  1 mg Oral Daily   multivitamin with minerals  1 tablet Oral Daily   polyethylene glycol  17 g Oral BID   senna-docusate  2 tablet Oral QHS   Continuous Infusions:     LOS: 3 days   Time spent= 35 mins    Cieara Stierwalt Arsenio Loader, MD [facility]  If 7PM-7AM, please contact night-coverage  10/23/2022, 8:17 AM

## 2022-10-23 NOTE — Procedures (Signed)
Ultrasound-guided  therapeutic paracentesis performed yielding 3.1 liters of yellow  fluid. No immediate complications. EBL< 2 cc.

## 2022-10-23 NOTE — Telephone Encounter (Signed)
Called Christina [name] and advised the cytology from her paracentesis is still in process.  It will be assigned to a pathologist today and will be resulted either this afternoon or the next few days depending on if any special stains are ordered.  She verbalized understanding and agreement and did not have any questions.

## 2022-10-23 NOTE — Plan of Care (Signed)
Patient AOX4, VSS throughout shift.  All meds given on time as ordered.  Pt c/o pain relieved by PRN oxycodone and norco.  Diminished lungs, IS encouraged.  Pt ambulated with steady gait to bathroom.  POC maintained, will continue to monitor.  Problem: Education: Goal: Knowledge of General Education information will improve Description: Including pain rating scale, medication(s)/side effects and non-pharmacologic comfort measures Outcome: Progressing   Problem: Health Behavior/Discharge Planning: Goal: Ability to manage health-related needs will improve Outcome: Progressing   Problem: Clinical Measurements: Goal: Ability to maintain clinical measurements within normal limits will improve Outcome: Progressing Goal: Will remain free from infection Outcome: Progressing Goal: Diagnostic test results will improve Outcome: Progressing Goal: Respiratory complications will improve Outcome: Progressing Goal: Cardiovascular complication will be avoided Outcome: Progressing   Problem: Activity: Goal: Risk for activity intolerance will decrease Outcome: Progressing   Problem: Nutrition: Goal: Adequate nutrition will be maintained Outcome: Progressing   Problem: Coping: Goal: Level of anxiety will decrease Outcome: Progressing   Problem: Elimination: Goal: Will not experience complications related to bowel motility Outcome: Progressing Goal: Will not experience complications related to urinary retention Outcome: Progressing   Problem: Pain Managment: Goal: General experience of comfort will improve Outcome: Progressing   Problem: Safety: Goal: Ability to remain free from injury will improve Outcome: Progressing   Problem: Skin Integrity: Goal: Risk for impaired skin integrity will decrease Outcome: Progressing

## 2022-10-23 NOTE — Progress Notes (Signed)
Gynecologic Oncology Progress Note  CLYTEE HEINRICH is a 47 y.o. woman currently admitted with ascites, bilateral cystic and solid ovarian masses, and pancreatic lesion noted on recent CT imaging. Findings concerning for ovarian cancer, but given her family history and findings within the pancreas on CT, pancreatic cancer or GI source should be considered as well. Awaiting cytology. If fluid from recent paracentesis provides a diagnosis, then no biopsy needed.  CA 125 at 314 CEA at 84 CA 19-9 at 25  Subjective: Patient reports feeling uncomfortable due to abdominal distension. Reports moderate nausea after eating food yesterday throughout the day. No emesis. She is able to drink the ensure drinks. She reports feeling weak with abdominal pulling when out of bed. Urinating without difficulty. Reports moderate amount of flatus yesterday. No BM. Continuing to belch intermittently. Denies chest pain. Reports shortness of breath intermittently related to abdominal fullness. Abdominal pain and pressure, more in lower quadrants. All questions answered.  Objective: Vital signs in last 24 hours: Temp:  [98.2 F (36.8 C)-98.3 F (36.8 C)] 98.3 F (36.8 C) (02/06 0553) Pulse Rate:  [90-98] 94 (02/06 0553) Resp:  [17-18] 17 (02/06 0553) BP: (125-153)/(92-98) 147/94 (02/06 0553) SpO2:  [98 %-100 %] 98 % (02/06 0553) Last BM Date : 11/13/22  Intake/Output from previous day: 02/05 0701 - 02/06 0700 In: 480 [P.O.:480] Out: -   Physical Examination: General: alert, cooperative, no distress, and appears uncomfortable Resp: clear to auscultation bilaterally Cardio: regular rate and rhythm, S1, S2 normal, no murmur, click, rub or gallop GI: abdomen distended, more firm compared with 2/5 assessment, positive fluid wave, active bowel sounds more in upper quadrants, tympanic on percussion Extremities: extremities normal, atraumatic, no cyanosis or edema  Labs: WBC/Hgb/Hct/Plts:  9.7/8.7/28.5/462 (02/06  0523) BUN/Cr/glu/ALT/AST/amyl/lip:  12/0.56/--/18/14/--/-- (02/06 0523)     10/23/2022    5:53 AM 10/22/2022    9:30 PM 10/22/2022    3:46 PM  Vitals with BMI  Systolic 300 762 263  Diastolic 94 92 98  Pulse 94 90 98     Assessment: 47 y.o. currently admitted with ascites, bilateral cystic and solid ovarian masses, and pancreatic lesion noted on recent CT imaging. Cytology pending from recent paracentesis.   Pain:  Pain is well-controlled on PRN medications.  Heme: Hgb 8.7 and Hct 28.5 this am. Stable compared with previous values.  ID: WBC 9.7. No evidence of infection at this time. Afebrile.  CV: BP and HR overall stable, BP slightly elevated at times. Continue to monitor with ordered vital signs while inpatient.  GI:  Tolerating po: liquids, nausea with solid food. Antiemetics ordered as needed.  GU: Creatinine 0.56 this am. Pt reporting adequate output.    FEN: No critical values on am labs.  Prophylaxis: Lovenox ordered.  Plan: Awaiting cytology. Per pathology, cytology to be assigned to pathologist today. RUSH placed on read. Continue with plan of care per Hospitalist Team   LOS: 3 days    Dorothyann Gibbs 10/23/2022, 8:30 AM

## 2022-10-23 NOTE — Progress Notes (Signed)
Nutrition Follow-up  INTERVENTION:   -Continue Ensure Plus High Protein po TID, each supplement provides 350 kcal and 20 grams of protein.   -Multivitamin with minerals daily  -Prosource Plus PO BID, each provides 100 kcals and 15g protein  -Discussed strategies for constipation. Diet is limited d/t abdominal distention.  NUTRITION DIAGNOSIS:   Increased nutrient needs related to cancer and cancer related treatments as evidenced by estimated needs.  Ongoing.  GOAL:   Patient will meet greater than or equal to 90% of their needs  Progressing.  MONITOR:   PO intake, Supplement acceptance, I & O's, Labs, Weight trends  REASON FOR ASSESSMENT:   Consult Assessment of nutrition requirement/status, Diet education  ASSESSMENT:   47 y.o. female with medical history significant of anxiety, HPV comes to the hospital at [facility] with complaints of abdominal distention and constipation. CT abdomen pelvis showed peritoneal carcinomatosis with extensive metastases, ascites and multiloculated cystic lesion of the pancreatic tail.  GYN oncology consulted, underwent paracentesis, 3.5 L was removed.  Spoke with patient, states her intakes have been poor d/t increasing distention and pain. Pt has been taking her Miralax and Senokot in order to have a BM, none yet. Pt drinking water and drinking protein shakes from home [employer]). Pt may have another paracentesis today or tomorrow.  Encouraged pt to keep drinking fluids, move around if she can and try sipping on hot/warm liquids along with ice cold fluids.   Admission weight: 153 lbs  Medications: Ferrous sulfate, Folic acid, Multivitamin with minerals daily, Miralax, Senokot, Zofran  Labs reviewed: Low Na   NUTRITION - FOCUSED PHYSICAL EXAM:  Flowsheet Row Most Recent Value  Orbital Region No depletion  Upper Arm Region Mild depletion  Thoracic and Lumbar Region Unable to assess  [stomach distention, painful]  Buccal  Region Mild depletion  Temple Region Mild depletion  Clavicle Bone Region No depletion  Clavicle and Acromion Bone Region No depletion  Scapular Bone Region No depletion  Dorsal Hand No depletion  Patellar Region No depletion  Anterior Thigh Region No depletion  Posterior Calf Region No depletion  Edema (RD Assessment) None  Hair Reviewed  Eyes Reviewed  Mouth Reviewed  [poor dentition]  Skin Reviewed       Diet Order:   Diet Order             Diet regular Room service appropriate? Yes; Fluid consistency: Thin  Diet effective now                   EDUCATION NEEDS:   No education needs have been identified at this time  Skin:  Skin Assessment: Reviewed RN Assessment  Last BM:  1/27  Height:   Ht Readings from Last 1 Encounters:  10/19/22 '4\' 11"'$  (1.499 m)    Weight:   Wt Readings from Last 1 Encounters:  10/19/22 69.4 kg   BMI:  Body mass index is 30.9 kg/m.  Estimated Nutritional Needs:   Kcal:  1500-1700  Protein:  75-90g  Fluid:  1.5L/day  Clayton Bibles, MS, RD, LDN Inpatient Clinical Dietitian Contact information available via Amion

## 2022-10-24 ENCOUNTER — Other Ambulatory Visit (HOSPITAL_COMMUNITY)
Admission: RE | Admit: 2022-10-24 | Discharge: 2022-10-24 | Disposition: A | Discharge status: Home / Self Care | Payer: Medicaid Other | Source: Ambulatory Visit

## 2022-10-24 DIAGNOSIS — E440 Moderate protein-calorie malnutrition: Secondary | ICD-10-CM | POA: Diagnosis not present

## 2022-10-24 DIAGNOSIS — C801 Malignant (primary) neoplasm, unspecified: Secondary | ICD-10-CM

## 2022-10-24 DIAGNOSIS — K5900 Constipation, unspecified: Secondary | ICD-10-CM | POA: Diagnosis not present

## 2022-10-24 DIAGNOSIS — R1084 Generalized abdominal pain: Secondary | ICD-10-CM | POA: Diagnosis not present

## 2022-10-24 DIAGNOSIS — C786 Secondary malignant neoplasm of retroperitoneum and peritoneum: Secondary | ICD-10-CM | POA: Diagnosis not present

## 2022-10-24 LAB — COMPREHENSIVE METABOLIC PANEL
ALT: 16 U/L (ref 0–44)
AST: 14 U/L — ABNORMAL LOW (ref 15–41)
Albumin: 2.1 g/dL — ABNORMAL LOW (ref 3.5–5.0)
Alkaline Phosphatase: 82 U/L (ref 38–126)
Anion gap: 11 (ref 5–15)
BUN: 14 mg/dL (ref 6–20)
CO2: 27 mmol/L (ref 22–32)
Calcium: 8.6 mg/dL — ABNORMAL LOW (ref 8.9–10.3)
Chloride: 93 mmol/L — ABNORMAL LOW (ref 98–111)
Creatinine, Ser: 0.6 mg/dL (ref 0.44–1.00)
GFR, Estimated: >60 mL/min (ref >60)
Glucose, Bld: 144 mg/dL — ABNORMAL HIGH (ref 70–99)
Potassium: 4.5 mmol/L (ref 3.5–5.1)
Sodium: 131 mmol/L — ABNORMAL LOW (ref 135–145)
Total Bilirubin: 0.3 mg/dL (ref 0.3–1.2)
Total Protein: 5.7 g/dL — ABNORMAL LOW (ref 6.5–8.1)

## 2022-10-24 LAB — CBC
HCT: 30.9 % — ABNORMAL LOW (ref 36.0–46.0)
Hemoglobin: 8.8 g/dL — ABNORMAL LOW (ref 12.0–15.0)
MCH: 22.9 pg — ABNORMAL LOW (ref 26.0–34.0)
MCHC: 28.5 g/dL — ABNORMAL LOW (ref 30.0–36.0)
MCV: 80.3 fL (ref 80.0–100.0)
Platelets: 439 10*3/uL — ABNORMAL HIGH (ref 150–400)
RBC: 3.85 MIL/uL — ABNORMAL LOW (ref 3.87–5.11)
RDW: 16.3 % — ABNORMAL HIGH (ref 11.5–15.5)
WBC: 8.8 10*3/uL (ref 4.0–10.5)
nRBC: 0 % (ref 0.0–0.2)

## 2022-10-24 LAB — CYTOLOGY - NON PAP

## 2022-10-24 LAB — MAGNESIUM: Magnesium: 1.8 mg/dL (ref 1.7–2.4)

## 2022-10-24 MED ORDER — BISACODYL 10 MG RE SUPP
10.0000 mg | Freq: Once | RECTAL | Status: AC
  Administered 2022-10-24: 10 mg via RECTAL
  Filled 2022-10-24: qty 1

## 2022-10-24 MED ORDER — BOOST / RESOURCE BREEZE PO LIQD CUSTOM
1.0000 | Freq: Three times a day (TID) | ORAL | Status: DC
  Administered 2022-10-24 (×2): 1 via ORAL

## 2022-10-24 NOTE — Progress Notes (Signed)
Gynecologic Oncology Progress Note  Christina [name] is a 47 y.o. woman currently admitted with ascites, bilateral cystic and solid ovarian masses, and pancreatic lesion noted on recent CT imaging. Findings concerning for ovarian cancer, but given her family history and findings within the pancreas on CT, pancreatic cancer or GI source should be considered as well. Awaiting cytology. Preliminarily, cytology resulting metastatic adenocarcinoma, awaiting stain results.  CA 125 at 314 CEA at 84 CA 19-9 at 25  Subjective: Patient reports feeling slightly better after paracentesis yesterday. She was able to eat after having the paracentesis. She threw up yesterday after having ensure and miralax. Urinating without difficulty. Continues to report moderate amount of flatus. No BM. Continuing to hiccup intermittently. Denies chest pain. Reports shortness of breath intermittently related to abdominal fullness. The binder helped when ambulating.   Objective: Vital signs in last 24 hours: Temp:  [98.1 F (36.7 C)-98.6 F (37 C)] 98.1 F (36.7 C) (02/07 0521) Pulse Rate:  [86-109] 86 (02/07 0521) Resp:  [16-18] 16 (02/07 0521) BP: (141-170)/(70-112) 148/97 (02/07 0521) SpO2:  [98 %-99 %] 98 % (02/07 0521) Last BM Date : 11/13/22  Intake/Output from previous day: 02/06 0701 - 02/07 0700 In: 320 [P.O.:320] Out: -   Physical Examination (performed by Dr. Berline Lopes): General: alert, cooperative, and no distress Resp: clear to auscultation bilaterally Cardio: tachycardic in 110s GI: abdomen distended (less than 2/6 assessment), hypoactive bowel sounds with tinkling Extremities: extremities normal, atraumatic, no cyanosis or edema  Labs: WBC/Hgb/Hct/Plts:  8.8/8.8/30.9/439 (02/07 9983) BUN/Cr/glu/ALT/AST/amyl/lip:  14/0.60/--/16/14/--/-- (02/07 3825)     10/24/2022    5:21 AM 10/23/2022    9:02 PM 10/23/2022    2:07 PM  Vitals with BMI  Systolic 053 976 734  Diastolic 97 91 70  Pulse 86 102       Assessment: 47 y.o. currently admitted with ascites, bilateral cystic and solid ovarian masses, and pancreatic lesion noted on recent CT imaging. Cytology pending from recent paracentesis.   Pain:  Pain is well-controlled on PRN medications.  Heme: Hgb 8.8 and Hct 30.9 this am. Stable compared with previous values.  ID: WBC 8.8. No evidence of infection at this time. Afebrile.  CV: BP and HR overall stable, BP slightly elevated at times. Continue to monitor with ordered vital signs while inpatient.  GI:  Tolerating po: intermittent nausea, emesis on 2/6. Antiemetics ordered as needed.  GU: Creatinine 0.60 this am. Pt reporting adequate output.    FEN: No critical values on am labs.  Prophylaxis: Lovenox ordered.  Plan: -Plan for patient to come to the [facility] later this am for pelvic examination, possible biopsies if needed. -Awaiting stains on cytology. Preliminary read of metastatic adenocarcinoma. -Based on tumor markers, more concerned about malignancy from GI tract or pancreas. CA 125 is high but can be elevated in non-gyn cancers and other benign conditions. Ratio of CA 125 to CEA suggests GI malignancy more likely. Dr. Berline Lopes discussed the above with the patient. -Change ensure to non-diary option like Boost Breeze -Order dulcolax suppository -Continue with plan of care per Hospitalist Team   LOS: 4 days    Dorothyann Gibbs 10/24/2022, 7:44 AM

## 2022-10-24 NOTE — Plan of Care (Signed)
Patient AOX4, VSS throughout shift. All meds given on time as ordered. Pt c/o pain relieved by PRN oxycodone and norco and IV dilaudid. Diminished lungs, IS encouraged. Pt ambulated with steady gait to bathroom. POC maintained, will continue to monitor.  Problem: Education: Goal: Knowledge of General Education information will improve Description: Including pain rating scale, medication(s)/side effects and non-pharmacologic comfort measures Outcome: Progressing   Problem: Health Behavior/Discharge Planning: Goal: Ability to manage health-related needs will improve Outcome: Progressing   Problem: Clinical Measurements: Goal: Ability to maintain clinical measurements within normal limits will improve Outcome: Progressing Goal: Will remain free from infection Outcome: Progressing Goal: Diagnostic test results will improve Outcome: Progressing Goal: Respiratory complications will improve Outcome: Progressing Goal: Cardiovascular complication will be avoided Outcome: Progressing   Problem: Activity: Goal: Risk for activity intolerance will decrease Outcome: Progressing   Problem: Nutrition: Goal: Adequate nutrition will be maintained Outcome: Progressing   Problem: Coping: Goal: Level of anxiety will decrease Outcome: Progressing   Problem: Elimination: Goal: Will not experience complications related to bowel motility Outcome: Progressing Goal: Will not experience complications related to urinary retention Outcome: Progressing   Problem: Pain Managment: Goal: General experience of comfort will improve Outcome: Progressing   Problem: Safety: Goal: Ability to remain free from injury will improve Outcome: Progressing   Problem: Skin Integrity: Goal: Risk for impaired skin integrity will decrease Outcome: Progressing

## 2022-10-24 NOTE — Progress Notes (Signed)
PROGRESS NOTE  Christina [name]  GUR:[id number] DOB: [birth year] DOA: 10/19/2022 PCP: [name], MD   Brief Narrative: Patient is a 47 year old female with history of anxiety, SVT who presented with complaint of abdominal distention, constipation.  CT abdomen/pelvis showed peritoneal carcinomatosis with extensive metastasis, ascites, multiloculated cystic lesion of the pancreatic tail.  GYN oncology consulted.  Underwent paracentesis with removal of 3.  5 L of fluid.  Tumor markers are elevated CA125, CEA.  GYN oncology following.  Waiting on cytology report from paracentesis.  GYN oncology planning for pelvic examination today  Assessment & Plan:  Principal Problem:   Peritoneal carcinomatosis ([place]) Active Problems:   S/P cesarean section   Generalized abdominal pain   Constipation   Ovarian mass   Pancreatic mass   Tobacco abuse   Malignant ascites   Protein-calorie malnutrition ([facility])   Abdominal pain secondary to peritoneal carcinomatosis/ascites: Concern of extensive metastatic disease with unknown primary as seen on the CT scan.  CT concerning for bilateral cystic/solid ovarian masses, pancreatic lesion also noted.  Findings concerning for ovarian cancer.  Underwent paracentesis on 2/3 with removal of 3.5 L of fluid.  Cytology from paracentesis still pending.  GYN oncology following. CEA, CA125 elevated, CA 19-9 is normal. As per GYN oncology, she has been planned to come to cancer center for a pelvic examination and possible biopsy.  Ascites: Underwent paracentesis twice, first on 2/3 and second on 2/6.  Continue monitoring for further paracentesis.  Continue supportive care  Microcytic anemia/iron deficiency/folic acid deficiency: Continue iron supplement, folic acid supplement.  Moderate protein calorie malnutrition: Nutritionist consulted and following  Hypokalemia/hypomagnesemia: Currently being monitored and supplemented as needed  Constipation: Lack of bowel movement for  last several days.  Continue aggressive bowel regimen.  May need enema  Bilateral lower extremity rash: On knees.  On Benadryl cream for itch   Nutrition Problem: Increased nutrient needs Etiology: cancer and cancer related treatments    DVT prophylaxis:enoxaparin (LOVENOX) injection 40 mg Start: 10/20/22 1415     Code Status: Full Code  Family Communication: None at the bedside  Patient status: Inpatient  Patient is from : Home  Anticipated discharge to: Home  Estimated DC date: After full workup   Consultants: GYN oncology  Procedures: Paracentesis  Antimicrobials:  Anti-infectives (From admission, onward)    None       Subjective: Patient seen and examined at the bedside today.  Hemodynamically stable.  Lying in bed.  Appears comfortable.  Denies nausea, vomiting or abdominal pain today.  Lack of bowel movement for last several days.  Objective: Vitals:   10/23/22 1355 10/23/22 1407 10/23/22 2102 10/24/22 0521  BP: (!) 162/107 (!) 155/70 (!) 141/91 (!) 148/97  Pulse:   (!) 102 86  Resp:   16 16  Temp:   98.4 F (36.9 C) 98.1 F (36.7 C)  TempSrc:   Oral Oral  SpO2:   98% 98%  Weight:      Height:        Intake/Output Summary (Last 24 hours) at 10/24/2022 1107 Last data filed at 10/24/2022 0800 Gross per 24 hour  Intake 320 ml  Output 0 ml  Net 320 ml   Filed Weights   10/19/22 1156  Weight: 69.4 kg    Examination:  General exam: Overall comfortable, not in distress HEENT: PERRL Respiratory system:  no wheezes or crackles  Cardiovascular system: S1 & S2 heard, RRR.  Gastrointestinal system: Abdomen is distended, soft and nontender.  Bowel  sounds present Central nervous system: Alert and oriented Extremities: No edema, no clubbing ,no cyanosis Skin: No rashes, no ulcers,no icterus     Data Reviewed: I have personally reviewed following labs and imaging studies  CBC: Recent Labs  Lab 10/20/22 0836 10/21/22 0617 10/22/22 0533  10/23/22 0523 10/24/22 0613  WBC 11.9* 9.6 9.5 9.7 8.8  HGB 9.5* 9.0* 8.6* 8.7* 8.8*  HCT 30.4* 29.5* 27.9* 28.5* 30.9*  MCV 75.4* 76.0* 75.6* 76.2* 80.3  PLT 471* 381 427* 462* 093*   Basic Metabolic Panel: Recent Labs  Lab 10/20/22 0836 10/21/22 0617 10/22/22 0533 10/23/22 0523 10/24/22 0613  NA 132* 133* 130* 134* 131*  K 3.4* 3.4* 4.1 4.0 4.5  CL 91* 95* 93* 95* 93*  CO2 '29 27 28 29 27  '$ GLUCOSE 136* 138* 146* 150* 144*  BUN '13 12 10 12 14  '$ CREATININE 0.71 0.66 0.54 0.56 0.60  CALCIUM 8.9 8.2* 8.4* 8.8* 8.6*  MG  --  1.9 2.0 2.0 1.8  PHOS  --   --  3.5  --   --      Recent Results (from the past 240 hour(s))  Fungus Culture With Stain     Status: None (Preliminary result)   Collection Time: 10/20/22  9:45 AM   Specimen: PATH Cytology Peritoneal fluid  Result Value Ref Range Status   Fungus Stain Final report  Final    Comment: (NOTE) Performed At: [facility] [address] [place], [place] [id number] Rush Farmer MD UR:[id number]    Fungus (Mycology) Culture PENDING  Incomplete   Fungal Source PERITONEAL  Final    Comment: Performed at [facility] [place] [address] [place], [place] [address]  Aerobic/Anaerobic Culture w Gram Stain (surgical/deep wound)     Status: None (Preliminary result)   Collection Time: 10/20/22  9:45 AM   Specimen: PATH Cytology Peritoneal fluid  Result Value Ref Range Status   Specimen Description   Final    PERITONEAL Performed at [facility] [address], [place], [place] [address]    Special Requests   Final    NONE Performed at [facility], [place] [address], [place], [place] [address]    Gram Stain   Final    RARE WBC PRESENT, PREDOMINANTLY PMN NO ORGANISMS SEEN    Culture   Final    NO GROWTH 4 DAYS NO ANAEROBES ISOLATED; CULTURE IN PROGRESS FOR 5 DAYS Performed at [facility] [address], [place], [place] [address]    Report Status PENDING   Incomplete  Fungus Culture Result     Status: None   Collection Time: 10/20/22  9:45 AM  Result Value Ref Range Status   Result 1 Comment  Final    Comment: (NOTE) KOH/Calcofluor preparation:  no fungus observed. Performed At: [facility] [place], [place] [id number] Rush Farmer MD KK:[id number]      Radiology Studies: US Paracentesis  Result Date: 10/23/2022 INDICATION: Patient with history of abdominal pain, peritoneal carcinomatosis, cystic and solid masses of bilateral ovaries, cystic lesion pancreatic tail, recurrent ascites. Request received for therapeutic paracentesis. EXAM: ULTRASOUND GUIDED THERAPEUTIC PARACENTESIS MEDICATIONS: 8 mL 1% lidocaine COMPLICATIONS: None immediate. PROCEDURE: Informed written consent was obtained from the patient after a discussion of the risks, benefits and alternatives to treatment. A timeout was performed prior to the initiation of the procedure. Initial ultrasound scanning demonstrates a moderate amount of ascites within the left lower abdominal quadrant. The  left lower abdomen was prepped and draped in the usual sterile fashion. 1% lidocaine was used for local anesthesia. Following this, a 19 gauge, 7-cm, Yueh catheter was introduced. An ultrasound image was saved for documentation purposes. The paracentesis was performed. The catheter was removed and a dressing was applied. The patient tolerated the procedure well without immediate post procedural complication. FINDINGS: A total of approximately 3.1 liters of yellow fluid was removed. IMPRESSION: Successful ultrasound-guided therapeutic paracentesis yielding 3.1 liters of peritoneal fluid. Read by: Rowe Robert, PA-C Electronically Signed   By: Markus Daft M.D.   On: 10/23/2022 16:47    Scheduled Meds:  (feeding supplement) PROSource Plus  30 mL Oral BID BM   docusate sodium  100 mg Oral BID   enoxaparin (LOVENOX) injection  40 mg Subcutaneous Daily   feeding supplement  1  Container Oral TID BM   ferrous sulfate  325 mg Oral Q breakfast   folic acid  1 mg Oral Daily   multivitamin with minerals  1 tablet Oral Daily   polyethylene glycol  17 g Oral BID   senna-docusate  2 tablet Oral QHS   Continuous Infusions:   LOS: 4 days   Shelly Coss, MD [facility] P2/03/2023, 11:07 AM

## 2022-10-25 ENCOUNTER — Inpatient Hospital Stay (HOSPITAL_COMMUNITY): Payer: Medicaid Other

## 2022-10-25 DIAGNOSIS — C786 Secondary malignant neoplasm of retroperitoneum and peritoneum: Secondary | ICD-10-CM | POA: Diagnosis not present

## 2022-10-25 DIAGNOSIS — R1084 Generalized abdominal pain: Secondary | ICD-10-CM | POA: Diagnosis not present

## 2022-10-25 DIAGNOSIS — E440 Moderate protein-calorie malnutrition: Secondary | ICD-10-CM | POA: Diagnosis not present

## 2022-10-25 DIAGNOSIS — K5900 Constipation, unspecified: Secondary | ICD-10-CM | POA: Diagnosis not present

## 2022-10-25 LAB — AEROBIC/ANAEROBIC CULTURE W GRAM STAIN (SURGICAL/DEEP WOUND): Culture: NO GROWTH

## 2022-10-25 LAB — LIPASE, FLUID: Lipase-Fluid: 5 U/L

## 2022-10-25 LAB — CBC
HCT: 29.6 % — ABNORMAL LOW (ref 36.0–46.0)
Hemoglobin: 9.2 g/dL — ABNORMAL LOW (ref 12.0–15.0)
MCH: 23.1 pg — ABNORMAL LOW (ref 26.0–34.0)
MCHC: 31.1 g/dL (ref 30.0–36.0)
MCV: 74.2 fL — ABNORMAL LOW (ref 80.0–100.0)
Platelets: 558 10*3/uL — ABNORMAL HIGH (ref 150–400)
RBC: 3.99 MIL/uL (ref 3.87–5.11)
RDW: 16.2 % — ABNORMAL HIGH (ref 11.5–15.5)
WBC: 11.8 10*3/uL — ABNORMAL HIGH (ref 4.0–10.5)
nRBC: 0 % (ref 0.0–0.2)

## 2022-10-25 MED ORDER — FUROSEMIDE 40 MG PO TABS: 40.0000 mg | ORAL_TABLET | Freq: Every day | ORAL | Status: DC

## 2022-10-25 MED ORDER — MINERAL OIL RE ENEM
1.0000 | ENEMA | Freq: Once | RECTAL | Status: AC
  Administered 2022-10-26: 1 via RECTAL
  Filled 2022-10-25: qty 1

## 2022-10-25 MED ORDER — FUROSEMIDE 40 MG PO TABS
40.0000 mg | ORAL_TABLET | Freq: Every day | ORAL | Status: DC
  Administered 2022-10-25 – 2022-11-01 (×7): 40 mg via ORAL
  Filled 2022-10-25 (×8): qty 1

## 2022-10-25 MED ORDER — SODIUM CHLORIDE (PF) 0.9 % IJ SOLN
INTRAMUSCULAR | Status: AC
  Filled 2022-10-25: qty 50

## 2022-10-25 MED ORDER — AMLODIPINE BESYLATE 5 MG PO TABS
5.0000 mg | ORAL_TABLET | Freq: Every day | ORAL | Status: DC
  Administered 2022-10-25: 5 mg via ORAL
  Filled 2022-10-25: qty 1

## 2022-10-25 MED ORDER — SPIRONOLACTONE 12.5 MG HALF TABLET
12.5000 mg | ORAL_TABLET | Freq: Every day | ORAL | Status: DC
  Filled 2022-10-25: qty 1

## 2022-10-25 MED ORDER — HEPARIN (PORCINE) 25000 UT/250ML-% IV SOLN
1250.0000 [IU]/h | INTRAVENOUS | Status: DC
  Administered 2022-10-25: 1050 [IU]/h via INTRAVENOUS
  Filled 2022-10-25: qty 250

## 2022-10-25 MED ORDER — FUROSEMIDE 20 MG PO TABS: 20.0000 mg | ORAL_TABLET | Freq: Every day | ORAL | Status: DC

## 2022-10-25 MED ORDER — SPIRONOLACTONE 25 MG PO TABS
25.0000 mg | ORAL_TABLET | Freq: Every day | ORAL | Status: DC
  Administered 2022-10-25 – 2022-11-17 (×19): 25 mg via ORAL
  Filled 2022-10-25 (×18): qty 1

## 2022-10-25 MED ORDER — HEPARIN BOLUS VIA INFUSION
1000.0000 [IU] | Freq: Once | INTRAVENOUS | Status: AC
  Administered 2022-10-25: 1000 [IU] via INTRAVENOUS
  Filled 2022-10-25: qty 1000

## 2022-10-25 MED ORDER — SPIRONOLACTONE 25 MG PO TABS: 25.0000 mg | ORAL_TABLET | Freq: Every day | ORAL | Status: DC

## 2022-10-25 MED ORDER — ALUM & MAG HYDROXIDE-SIMETH 200-200-20 MG/5ML PO SUSP
30.0000 mL | ORAL | Status: DC | PRN
  Administered 2022-10-25 – 2022-11-17 (×10): 30 mL via ORAL
  Filled 2022-10-25 (×11): qty 30

## 2022-10-25 MED ORDER — IOHEXOL 300 MG/ML  SOLN
75.0000 mL | Freq: Once | INTRAMUSCULAR | Status: AC | PRN
  Administered 2022-10-25: 75 mL via INTRAVENOUS

## 2022-10-25 NOTE — Progress Notes (Signed)
ANTICOAGULATION CONSULT NOTE - Initial Consult  Pharmacy Consult for Heparin Indication: pulmonary embolus  Allergies  Allergen Reactions   Claritin [Loratadine] Other (See Comments)    causes spasms   Diphenhydramine Other (See Comments)    Causes spasms and keeps alert for days   Lorazepam Other (See Comments)    After a few doses it made her feel paranoid    Patient Measurements: Height: '4\' 11"'$  (149.9 cm) Weight: 69.4 kg (153 lb) IBW/kg (Calculated) : 43.2 Heparin Dosing Weight: 59kg  Vital Signs: BP: 147/99 (02/08 1439) Pulse Rate: 101 (02/08 1439)  Labs: Recent Labs    10/23/22 0523 10/24/22 0613  HGB 8.7* 8.8*  HCT 28.5* 30.9*  PLT 462* 439*  CREATININE 0.56 0.60    Estimated Creatinine Clearance: 72.9 mL/min (by C-G formula based on SCr of 0.6 mg/dL).   Medical History: Past Medical History:  Diagnosis Date   Anxiety    Depression    Gestational diabetes mellitus, antepartum 2015   managed with diet   Hypertension    on meds until wt loss   PONV (postoperative nausea and vomiting)    Seizures ([place]) 2001   stressed induced; 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    Medications:  Scheduled:   (feeding supplement) PROSource Plus  30 mL Oral BID BM   amLODipine  5 mg Oral Daily   docusate sodium  100 mg Oral BID   enoxaparin (LOVENOX) injection  40 mg Subcutaneous Daily   feeding supplement  1 Container Oral TID BM   ferrous sulfate  325 mg Oral Q breakfast   folic acid  1 mg Oral Daily   furosemide  40 mg Oral Daily   mineral oil  1 enema Rectal Once   multivitamin with minerals  1 tablet Oral Daily   polyethylene glycol  17 g Oral BID   senna-docusate  2 tablet Oral QHS   sodium chloride (PF)       spironolactone  25 mg Oral Daily   Infusions:   Assessment: 44 yoF admitted on 2/2 with new finding of metastatic peritoneal carcinomatosis/ascites.  Scheduled for EGD 2/9 by GI to evaluate for primary source.  CT chest on 2/8 showed incidental  finding of small PE.  Pharmacy is consulted to dose heparin IV.  No prior to admission anticoagulation Lovenox '40mg'$  SQ daily ordered for inpatient VTE prophylaxis (2/3-last dose on 2/8 at 12:00) Baseline PT/INR wnl on 2/3 CBC: Hgb low at 8.8, Plt elevated at 439k  Goal of Therapy:  Heparin level 0.3-0.7 units/ml Monitor platelets by anticoagulation protocol: Yes   Plan:  Give heparin 1000 units bolus IV x 1  (reduced dose d/t Lovenox earlier today) Start heparin IV infusion at 1050 units/hr ([place] calculator) Heparin level 6 hours after starting Daily heparin level and CBC Per GI, hold heparin for 6 hours prior to endoscopy scheduled for 2/9 at 13:00.  Stop time 07:00 entered, RN aware.     Gretta Arab PharmD, BCPS [facility] main pharmacy [phone] 10/25/2022 7:48 PM

## 2022-10-25 NOTE — Consult Note (Addendum)
Christina [name]  [phone]   HEMATOLOGY ONCOLOGY INPATIENT CONSULTATION   RAJANAE [name]  DOB: Apr 16, 1976  MR#: [id number]  CSN#: [id number]    Requesting Physician:  Dr. 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 She presented to ED on October 19, 2022, CT abdomen pelvis showed diffuse peritoneal metastasis with omental caking, multiloculated cystic lesion of the pancreatic tail measuring 7.5 cm, bilateral large cystic and solid masses of the ovaries, and the large volume ascites.  She underwent paracentesis twice, cytology came back adenocarcinoma, diffusely positive for CK7, CK20 with focal weak labeling for CDX2, suggestive upper GI primary.  GYN oncologist Dr. Berline Lopes was initially consulted, but felt this is not primary GYN malignancy, and asked me to see the patient.  Patient has been having recurrent nausea and vomiting, has not been able to keep much food or liquids down.  She has been quite constipated lately, last bowel movement was a few weeks ago.  She has lost about 20 to 30 pounds in the past few months, with low appetite and fatigue.    MEDICAL HISTORY:  Past Medical History:  Diagnosis Date   Anxiety    Depression    Gestational diabetes mellitus, antepartum 2015   managed with diet   Hypertension    on meds until wt loss   PONV (postoperative nausea and vomiting)    Seizures ([place]) 2001   stressed induced; 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    SURGICAL HISTORY: Past Surgical History:  Procedure Laterality Date   APPENDECTOMY     CESAREAN SECTION     CESAREAN SECTION N/A 07/14/2014   Procedure: CESAREAN  SECTION;  Surgeon: Cheri Fowler, MD;  Location: [facility];  Service: Obstetrics;  Laterality: N/A;   CESAREAN SECTION N/A 05/23/2015   Procedure: CESAREAN SECTION;  Surgeon: Cheri Fowler, MD;  Location: [facility];  Service: Obstetrics;  Laterality: N/A;   LAPAROSCOPIC BILATERAL SALPINGECTOMY Bilateral 06/12/2016   Procedure: LAPAROSCOPIC BILATERAL SALPINGECTOMY;  Surgeon: Jonnie Kind, MD;  Location: [facility];  Service: Gynecology;  Laterality: Bilateral;    SOCIAL HISTORY: Social History   Socioeconomic History   Marital status: Legally Separated    Spouse name: Not on file   Number of children: Not on file   Years of education: Not on file   Highest education level: Not on file  Occupational History   Not on file  Tobacco Use   Smoking status: Every Day    Packs/day: 1.00    Years: 12.00    Total pack years: 12.00    Types: Cigarettes   Smokeless tobacco: Never  Substance and Sexual Activity   Alcohol use: Yes    Comment: rarely   Drug use: No    Comment: not during pregnancy   Sexual activity: Not Currently    Partners: Male    Birth control/protection: None  Other Topics Concern   Not on file  Social History Narrative   Not on file   Social Determinants of Health   Financial Resource Strain: Not on file  Food Insecurity: No Food Insecurity (10/20/2022)   Hunger Vital Sign    Worried About Running Out of  Food in the Last Year: Never true    [place] in the Last Year: Never true  Transportation Needs: No Transportation Needs (10/20/2022)   PRAPARE - Hydrologist (Medical): No    Lack of Transportation (Non-Medical): No  Physical Activity: Not on file  Stress: Not on file  Social Connections: Not on file  Intimate Partner Violence: Not At Risk (10/20/2022)   Humiliation, Afraid, Rape, and Kick questionnaire    Fear of Current or Ex-Partner: No    Emotionally Abused: No    Physically Abused: No    Sexually Abused: No    FAMILY  HISTORY: Family History  Problem Relation Age of Onset   Cancer Father    Hypertension Mother    Diabetes Mother    Autism Son     ALLERGIES:  is allergic to claritin [loratadine], diphenhydramine, and lorazepam.  MEDICATIONS:  Current Facility-Administered Medications  Medication Dose Route Frequency Provider Last Rate Last Admin   (feeding supplement) PROSource Plus liquid 30 mL  30 mL Oral BID BM Amin, Ankit Chirag, MD   30 mL at 10/25/22 1203   acetaminophen (TYLENOL) tablet 650 mg  650 mg Oral Q6H PRN Shela Leff, MD       alum & mag hydroxide-simeth (MAALOX/MYLANTA) 200-200-20 MG/5ML suspension 30 mL  30 mL Oral Q4H PRN Shelly Coss, MD   30 mL at 10/25/22 1735   amLODipine (NORVASC) tablet 5 mg  5 mg Oral Daily Shelly Coss, MD   5 mg at 10/25/22 1203   diphenhydrAMINE-zinc acetate (BENADRYL) 2-0.1 % cream   Topical TID PRN Damita Lack, MD   Given at 10/21/22 2111   docusate sodium (COLACE) capsule 100 mg  100 mg Oral BID Amin, Ankit Chirag, MD   100 mg at 10/24/22 2210   feeding supplement (BOOST / RESOURCE BREEZE) liquid 1 Container  1 Container Oral TID BM Cross, Melissa D, NP   1 Container at 10/24/22 1420   ferrous sulfate tablet 325 mg  325 mg Oral Q breakfast Amin, Ankit Chirag, MD   325 mg at 16/60/63 0160   folic acid (FOLVITE) tablet 1 mg  1 mg Oral Daily Amin, Ankit Chirag, MD   1 mg at 10/24/22 0900   furosemide (LASIX) tablet 40 mg  40 mg Oral Daily Green, Terri L, RPH   40 mg at 10/25/22 1204   guaiFENesin (ROBITUSSIN) 100 MG/5ML liquid 5 mL  5 mL Oral Q4H PRN Amin, Ankit Chirag, MD       heparin ADULT infusion 100 units/mL (25000 units/247m)  1,050 Units/hr Intravenous Continuous Shade, Christine E, RPH 10.5 mL/hr at 10/25/22 2021 1,050 Units/hr at 10/25/22 2021   hydrALAZINE (APRESOLINE) injection 10 mg  10 mg Intravenous Q4H PRN Amin, Ankit Chirag, MD       HYDROcodone-acetaminophen (NORCO/VICODIN) 5-325 MG per tablet 1-2 tablet  1-2 tablet Oral  Q4H PRN ADamita Lack MD   1 tablet at 10/25/22 1533   HYDROmorphone (DILAUDID) injection 0.5-1 mg  0.5-1 mg Intravenous Q2H PRN Amin, Ankit Chirag, MD   1 mg at 10/25/22 1941   ipratropium-albuterol (DUONEB) 0.5-2.5 (3) MG/3ML nebulizer solution 3 mL  3 mL Nebulization Q4H PRN Amin, Ankit Chirag, MD       metoprolol tartrate (LOPRESSOR) injection 5 mg  5 mg Intravenous Q4H PRN Amin, Ankit Chirag, MD       mineral oil enema 1 enema  1 enema Rectal Once AShelly Coss MD  multivitamin with minerals tablet 1 tablet  1 tablet Oral Daily Amin, Ankit Chirag, MD   1 tablet at 10/24/22 0900   naloxone (NARCAN) injection 0.4 mg  0.4 mg Intravenous PRN Shela Leff, MD       ondansetron [facility]) injection 4 mg  4 mg Intravenous Q6H PRN Amin, Ankit Chirag, MD   4 mg at 10/25/22 0856   polyethylene glycol (MIRALAX / GLYCOLAX) packet 17 g  17 g Oral BID Joylene John D, NP   17 g at 10/24/22 2210   senna-docusate (Senokot-S) tablet 2 tablet  2 tablet Oral QHS Joylene John D, NP   2 tablet at 10/24/22 2210   sodium chloride (PF) 0.9 % injection            spironolactone (ALDACTONE) tablet 25 mg  25 mg Oral Daily Minda Ditto, RPH   25 mg at 10/25/22 1203   traZODone (DESYREL) tablet 50 mg  50 mg Oral QHS PRN Amin, Ankit Chirag, MD        REVIEW OF SYSTEMS:   Constitutional: Denies fevers, chills or abnormal night sweats Eyes: Denies blurriness of vision, double vision or watery eyes Ears, nose, mouth, throat, and face: Denies mucositis or sore throat Respiratory: Denies cough, dyspnea or wheezes Cardiovascular: Denies palpitation, chest discomfort or lower extremity swelling Gastrointestinal:  Denies nausea, heartburn or change in bowel habits Skin: Denies abnormal skin rashes Lymphatics: Denies new lymphadenopathy or easy bruising Neurological:Denies numbness, tingling or new weaknesses Behavioral/Psych: Mood is stable, no new changes  All other systems were reviewed with the  patient and are negative.  PHYSICAL EXAMINATION: ECOG PERFORMANCE STATUS: 3 - Symptomatic, >50% confined to bed  Vitals:   10/25/22 1439 10/25/22 1948  BP: (!) 147/99 (!) 149/96  Pulse: (!) 101 (!) 102  Resp: 18 18  Temp:  98 F (36.7 C)  SpO2: 99% 98%   Filed Weights   10/19/22 1156  Weight: 153 lb (69.4 kg)    GENERAL:alert, no distress and comfortable SKIN: skin color, texture, turgor are normal, no rashes or significant lesions EYES: normal, conjunctiva are pink and non-injected, sclera clear OROPHARYNX:no exudate, no erythema and lips, buccal mucosa, and tongue normal  NECK: supple, thyroid normal size, non-tender, without nodularity LYMPH:  no palpable lymphadenopathy in the cervical, axillary or inguinal LUNGS: clear to auscultation and percussion with normal breathing effort HEART: regular rate & rhythm and no murmurs and no lower extremity edema ABDOMEN:abdomen soft, non-tender and normal bowel sounds Musculoskeletal:no cyanosis of digits and no clubbing  PSYCH: alert & oriented x 3 with fluent speech NEURO: no focal motor/sensory deficits  LABORATORY DATA:  I have reviewed the data as listed Lab Results  Component Value Date   WBC 11.8 (H) 10/25/2022   HGB 9.2 (L) 10/25/2022   HCT 29.6 (L) 10/25/2022   MCV 74.2 (L) 10/25/2022   PLT 558 (H) 10/25/2022   Recent Labs    10/22/22 0533 10/23/22 0523 10/24/22 0613  NA 130* 134* 131*  K 4.1 4.0 4.5  CL 93* 95* 93*  CO2 '28 29 27  '$ GLUCOSE 146* 150* 144*  BUN '10 12 14  '$ CREATININE 0.54 0.56 0.60  CALCIUM 8.4* 8.8* 8.6*  GFRNONAA >60 >60 >60  PROT 6.0* 6.0* 5.7*  ALBUMIN 2.2* 2.2* 2.1*  AST 14* 14* 14*  ALT '16 18 16  '$ ALKPHOS 71 78 82  BILITOT 0.3 0.2* 0.3    RADIOGRAPHIC STUDIES: I have personally reviewed the radiological images as listed and agreed  with the findings in the report. CT CHEST W CONTRAST  Result Date: 10/25/2022 CLINICAL DATA:  Occult malignancy. EXAM: CT CHEST WITH CONTRAST TECHNIQUE:  Multidetector CT imaging of the chest was performed during intravenous contrast administration. RADIATION DOSE REDUCTION: This exam was performed according to the departmental dose-optimization program which includes automated exposure control, adjustment of the mA and/or kV according to patient size and/or use of iterative reconstruction technique. CONTRAST:  65m OMNIPAQUE IOHEXOL 300 MG/ML  SOLN COMPARISON:  CT abdomen 10/19/2022 FINDINGS: Cardiovascular: Heart is normal size. Thoracic aorta is normal in caliber. Pulmonary arterial system is adequately opacified and demonstrates small embolus of the proximal right lower lobar pulmonary artery. No evidence of right heart strain. Remaining vascular structures are normal. Mediastinum/Nodes: No mediastinal or hilar adenopathy. Remaining mediastinal structures are normal. Lungs/Pleura: Lungs are adequately inflated. No airspace consolidation or effusion. Airways are normal. Upper Abdomen: Mild to moderate ascites over the upper abdomen unchanged. Remainder of the upper abdomen is unchanged from the recent prior exam. Musculoskeletal: No focal abnormality. IMPRESSION: 1. Small embolus of the proximal right lower lobar pulmonary artery. No evidence of right heart strain. 2. Mild to moderate ascites over the upper abdomen unchanged from the recent prior exam. Critical Value/emergent results were called by telephone at the time of interpretation on 10/25/2022 at 7:10 p.m. to provider Nurse practitioner, ERufina Falco who verbally acknowledged these results. Electronically Signed   By: DMarin OlpM.D.   On: 10/25/2022 19:11   [place]orea[place]acentesis  Result Date: 10/23/2022 INDICATION: Patient with history of abdominal pain, peritoneal carcinomatosis, cystic and solid masses of bilateral ovaries, cystic lesion pancreatic tail, recurrent ascites. Request received for therapeutic paracentesis. EXAM: ULTRASOUND GUIDED THERAPEUTIC PARACENTESIS MEDICATIONS: 8 mL 1% lidocaine  COMPLICATIONS: None immediate. PROCEDURE: Informed written consent was obtained from the patient after a discussion of the risks, benefits and alternatives to treatment. A timeout was performed prior to the initiation of the procedure. Initial ultrasound scanning demonstrates a moderate amount of ascites within the left lower abdominal quadrant. The left lower abdomen was prepped and draped in the usual sterile fashion. 1% lidocaine was used for local anesthesia. Following this, a 19 gauge, 7-cm, Yueh catheter was introduced. An ultrasound image was saved for documentation purposes. The paracentesis was performed. The catheter was removed and a dressing was applied. The patient tolerated the procedure well without immediate post procedural complication. FINDINGS: A total of approximately 3.1 liters of yellow fluid was removed. IMPRESSION: Successful ultrasound-guided therapeutic paracentesis yielding 3.1 liters of peritoneal fluid. Read by: KRowe Robert PA-C Electronically Signed   By: AMarkus DaftM.D.   On: 10/23/2022 16:47   [place]acentesis  Result Date: 10/20/2022 INDICATION: Peritoneal carcinomatosis with ascites EXAM: ULTRASOUND GUIDED DIAGNOSTIC AND THERAPEUTIC PARACENTESIS MEDICATIONS: None. COMPLICATIONS: None immediate. PROCEDURE: Informed written consent was obtained from the patient after a discussion of the risks, benefits and alternatives to treatment. A timeout was performed prior to the initiation of the procedure. Initial ultrasound scanning demonstrates a moderate amount of ascites within the left upper abdominal quadrant. The left upper abdomen was prepped and draped in the usual sterile fashion. 1% lidocaine was used for local anesthesia. Following this, a 19 gauge, 7-cm, Yueh catheter was introduced under direct [place]ance. An ultrasound image was saved for documentation purposes. The paracentesis was performed. The catheter was removed and a dressing was applied. The patient tolerated the  procedure well without immediate post procedural complication. FINDINGS: A total of approximately 3.5L of ascitic fluid was  removed. Samples were sent to the laboratory as requested by the clinical team. IMPRESSION: Successful ultrasound-guided diagnostic and therapeutic paracentesis yielding 3.5 L of peritoneal fluid. Read by Pasty Spillers, IR PA Electronically Signed   By: Michaelle Birks M.D.   On: 10/20/2022 11:17   CT ABDOMEN PELVIS W CONTRAST  Result Date: 10/19/2022 CLINICAL DATA:  Abdominal pain; * Tracking Code: BO * EXAM: CT ABDOMEN AND PELVIS WITH CONTRAST TECHNIQUE: Multidetector CT imaging of the abdomen and pelvis was performed using the standard protocol following bolus administration of intravenous contrast. RADIATION DOSE REDUCTION: This exam was performed according to the departmental dose-optimization program which includes automated exposure control, adjustment of the mA and/or kV according to patient size and/or use of iterative reconstruction technique. CONTRAST:  13m OMNIPAQUE IOHEXOL 300 MG/ML  SOLN COMPARISON:  CT abdomen and pelvis dated October 21, 2009 FINDINGS: Lower chest: No acute abnormality. Hepatobiliary: No focal liver abnormality is seen. No gallstones, gallbladder wall thickening, or biliary dilatation. Pancreas: Multiloculated cystic lesion of the pancreatic tail measuring 7.5 x 4.1 cm on series 2, image 24. No main pancreatic duct dilation. Spleen: Normal in size without focal abnormality. Adrenals/Urinary Tract: Bilateral adrenal glands are unremarkable. No hydronephrosis. Nonobstructing stones of the lower pole of the left kidney. No suspicious renal lesions. Bladder is unremarkable. Stomach/Bowel: Stomach is within normal limits. No evidence of bowel wall thickening, distention, or inflammatory changes. Vascular/Lymphatic: Mild aortic atherosclerosis. No enlarged abdominal or pelvic lymph nodes. Reproductive: Left-greater-than-right multiloculated cystic and solid masses  of the bilateral ovaries measuring 8.2 x 8.3 x 10.1 cm on the left and 9.1 x 6.8 by 9.0 cm on the right. Other: Large volume abdominal ascites, peritoneal implants and omental caking. Reference peritoneal implant of the left paracolic gutter measuring 2.6 x 1.7 cm on series 2, image 29. Reference serosal implant located posterior to the wall of the stomach measuring 1.5 x 1.7 cm on image 30. Musculoskeletal: Tiny sclerotic lesions of the left sacrum are unchanged when compared with prior exam and likely due to bone islands. No aggressive appearing osseous lesions IMPRESSION: 1. Large volume abdominal ascites, peritoneal implants and omental caking, compatible with peritoneal carcinomatosis. 2. Left-greater-than-right multiloculated cystic and solid masses of the bilateral ovaries, differential considerations include primary ovarian malignancy or metastatic tumor to the ovaries. 3. Multiloculated cystic lesion of the pancreatic tail, concerning for primary pancreatic neoplasm versus metastatic disease. 4. Nonobstructing left renal stone. 5.  Aortic Atherosclerosis (ICD10-I70.0). Electronically Signed   By: LYetta GlassmanM.D.   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 Malignant ascites status post paracentesis twice Recurrent N/V and constipation, secondary to #1, partial bowel obstruction due to the extensive peritoneal metastasis Moderate protein and calorie malnutrition  Recommendations: -I have reviewed her CT scan images, discussed pathology results with pathologist today.  Based on the above, the primary tumor is likely upper GI, possible pancreatic cancer, but esophageal, gastric, or small intestine primary is also a possibility.  I recommend EGD as soon as possible.  Dr. SMichail Sermonhas been consulted, and plan to do EGD tomorrow. -CT  chest to complete staging, and ruled out additional metastasis or primary. -If EGD negative, it is also reasonable to consider colonoscopy to rule out colon cancer.  -Patient has partial bowel obstruction due to the extensive peritoneal metastasis, very malnourished, not tolerating oral intake, I  recommend TPN, please consult pharmacy to start in next few days. -I recommend starting chemo ASAP, possible first dose in hospital next Monday, likely FOLFOX.  -will ask IR to place an port  -pt lives in [place], I will refer her to Dr. Bobby Rumpf in [place] for follow-up. -Dietitian consult and social work consult. -genetic consult after discharge  -will request MMR, and FO on her cytology    All questions were answered. The patient knows to call the clinic with any problems, questions or concerns. I spent a total of 80 mins for her consult today including care coordination.     Truitt Merle, MD 10/25/2022

## 2022-10-25 NOTE — H&P (View-Only) (Signed)
Referring Provider: Dr. Tawanna Solo Primary Care Physician:  [name], MD Primary Gastroenterologist:  Althia Forts  Reason for Consultation:  Abdominal pain; Abnormal imaging  HPI: Christina [name] is a 47 y.o. female with several months of worsening abdominal distention, abdominal pain, constipation, and 40 pound unintentional weight loss in the past 2 months. Denies melena, hematochezia, N/V. CT shows peritoneal carcinomatosis with mets and concern for ovarian involvement and pancreatic lesions. 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 Occasional alcohol. Denies NSAIDs. Has not had an EGD or colonoscopy. Denies family history of stomach or colon cancer.  Past Medical History:  Diagnosis Date   Anxiety    Depression    Gestational diabetes mellitus, antepartum 2015   managed with diet   Hypertension    on meds until wt loss   PONV (postoperative nausea and vomiting)    Seizures ([place]) 2001   stressed induced; 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    Past Surgical History:  Procedure Laterality Date   APPENDECTOMY     CESAREAN SECTION     CESAREAN SECTION N/A 07/14/2014   Procedure: CESAREAN SECTION;  Surgeon: Cheri Fowler, MD;  Location: [facility];  Service: Obstetrics;  Laterality: N/A;   CESAREAN SECTION N/A 05/23/2015   Procedure: CESAREAN SECTION;  Surgeon: Cheri Fowler, MD;  Location: [facility];  Service: Obstetrics;  Laterality: N/A;   LAPAROSCOPIC BILATERAL SALPINGECTOMY Bilateral 06/12/2016   Procedure: LAPAROSCOPIC BILATERAL SALPINGECTOMY;  Surgeon: Jonnie Kind, MD;  Location: [facility];  Service: Gynecology;  Laterality: Bilateral;    Prior to Admission medications   Medication Sig Start Date End Date Taking? Authorizing Provider  acetaminophen (TYLENOL) 500 MG tablet Take 500-1,000 mg by mouth 3 (three) times daily as needed for mild pain or moderate pain.   Yes [provider]  dicyclomine (BENTYL) 20 MG tablet Take 20 mg  by mouth as needed for spasms. 07/18/22  Yes [provider]  Menthol, Topical Analgesic, (ICY HOT EX) Apply 1 application  topically as needed (pain).   Yes [provider]  OVER THE COUNTER MEDICATION Take 2 Bars by mouth as needed (constipation). Exlax Bars   Yes [provider]  polyethylene glycol (MIRALAX / GLYCOLAX) 17 g packet Take 17 g by mouth daily as needed for moderate constipation or severe constipation.   Yes [provider]  Simethicone (GAS-X PO) Take 2 tablets by mouth as needed (bloat).   Yes [provider]  Sodium Phosphates (FLEET ENEMA RE) Place 1 Dose rectally as needed (consttipation).   Yes [provider]    Scheduled Meds:  (feeding supplement) PROSource Plus  30 mL Oral BID BM   amLODipine  5 mg Oral Daily   docusate sodium  100 mg Oral BID   enoxaparin (LOVENOX) injection  40 mg Subcutaneous Daily   feeding supplement  1 Container Oral TID BM   ferrous sulfate  325 mg Oral Q breakfast   folic acid  1 mg Oral Daily   furosemide  40 mg Oral Daily   mineral oil  1 enema Rectal Once   multivitamin with minerals  1 tablet Oral Daily   polyethylene glycol  17 g Oral BID   senna-docusate  2 tablet Oral QHS   spironolactone  25 mg Oral Daily   Continuous Infusions: PRN Meds:.acetaminophen, alum & mag hydroxide-simeth, diphenhydrAMINE-zinc acetate, guaiFENesin, hydrALAZINE, HYDROcodone-acetaminophen, HYDROmorphone (DILAUDID) injection, ipratropium-albuterol, metoprolol tartrate, naLOXone (NARCAN)  injection, ondansetron (ZOFRAN) IV, traZODone  Allergies as of  10/19/2022 - Review Complete 10/19/2022  Allergen Reaction Noted   Claritin [loratadine] Other (See Comments) 09/02/2012   Diphenhydramine Other (See Comments) 09/02/2012    Family History  Problem Relation Age of Onset   Cancer Father    Hypertension Mother    Diabetes Mother    Autism Son     Social History   Socioeconomic History   Marital  status: Legally Separated    Spouse name: Not on file   Number of children: Not on file   Years of education: Not on file   Highest education level: Not on file  Occupational History   Not on file  Tobacco Use   Smoking status: Every Day    Packs/day: 1.00    Years: 12.00    Total pack years: 12.00    Types: Cigarettes   Smokeless tobacco: Never  Substance and Sexual Activity   Alcohol use: Yes    Comment: rarely   Drug use: No    Comment: not during pregnancy   Sexual activity: Not Currently    Partners: Male    Birth control/protection: None  Other Topics Concern   Not on file  Social History Narrative   Not on file   Social Determinants of Health   Financial Resource Strain: Not on file  Food Insecurity: No Food Insecurity (10/20/2022)   Hunger Vital Sign    Worried About Running Out of Food in the Last Year: Never true    Ran Out of Food in the Last Year: Never true  Transportation Needs: No Transportation Needs (10/20/2022)   PRAPARE - Hydrologist (Medical): No    Lack of Transportation (Non-Medical): No  Physical Activity: Not on file  Stress: Not on file  Social Connections: Not on file  Intimate Partner Violence: Not At Risk (10/20/2022)   Humiliation, Afraid, Rape, and Kick questionnaire    Fear of Current or Ex-Partner: No    Emotionally Abused: No    Physically Abused: No    Sexually Abused: No    Review of Systems: All negative except as stated above in HPI.  Physical Exam: Vital signs: Vitals:   10/25/22 0534 10/25/22 1439  BP: (!) 151/92 (!) 147/99  Pulse: (!) 104 (!) 101  Resp: 18 18  Temp: 98.1 F (36.7 C)   SpO2: 99% 99%   Last BM Date : 11/13/22 General:  Lethargic, thin, pleasant and cooperative in NAD Head: normocephalic, atraumatic Eyes: anicteric sclera ENT: oropharynx clear Neck: supple, nontender Lungs:  Clear throughout to auscultation.   No wheezes, crackles, or rhonchi. No acute distress. Heart:   Regular rate and rhythm; no murmurs, clicks, rubs,  or gallops. Abdomen: severe upper quadrant distention; diffuse tenderness (worse in right mid-quadrant) with guarding, +BS  Rectal:  Deferred Ext: no edema  GI:  Lab Results: Recent Labs    10/23/22 0523 10/24/22 0613  WBC 9.7 8.8  HGB 8.7* 8.8*  HCT 28.5* 30.9*  PLT 462* 439*   BMET Recent Labs    10/23/22 0523 10/24/22 0613  NA 134* 131*  K 4.0 4.5  CL 95* 93*  CO2 29 27  GLUCOSE 150* 144*  BUN 12 14  CREATININE 0.56 0.60  CALCIUM 8.8* 8.6*   LFT Recent Labs    10/24/22 0613  PROT 5.7*  ALBUMIN 2.1*  AST 14*  ALT 16  ALKPHOS 82  BILITOT 0.3   PT/INR No results for input(s): "LABPROT", "INR" in the last 72 hours.  Studies/Results: No results found.  Impression/Plan: Metastatic disease with unknown primary in need of EGD to evaluate for a gastric, duodenal, or esophageal primary and if unrevealing then will need an EUS to evaluate the pancreas. Clear liquid diet. NPO p MN. Supportive care. EGD tomorrow afternoon at 1 pm.    LOS: 5 days   [name]  10/25/2022, 4:00 PM  Questions please call [phone]

## 2022-10-25 NOTE — Plan of Care (Signed)
Patient AOX4, VSS throughout shift. All meds given on time as ordered. Pt c/o pain relieved by PRN oxycodone and norco and IV dilaudid. Diminished lungs, IS encouraged. Pt ambulated with steady gait to bathroom. Edema noted in Bilateral hips and upper thighs, provider notified.  POC maintained, will continue to monitor.  Problem: Education: Goal: Knowledge of General Education information will improve Description: Including pain rating scale, medication(s)/side effects and non-pharmacologic comfort measures Outcome: Progressing   Problem: Health Behavior/Discharge Planning: Goal: Ability to manage health-related needs will improve Outcome: Progressing   Problem: Clinical Measurements: Goal: Ability to maintain clinical measurements within normal limits will improve Outcome: Progressing Goal: Will remain free from infection Outcome: Progressing Goal: Diagnostic test results will improve Outcome: Progressing Goal: Respiratory complications will improve Outcome: Progressing Goal: Cardiovascular complication will be avoided Outcome: Progressing   Problem: Activity: Goal: Risk for activity intolerance will decrease Outcome: Progressing   Problem: Nutrition: Goal: Adequate nutrition will be maintained Outcome: Progressing   Problem: Coping: Goal: Level of anxiety will decrease Outcome: Progressing   Problem: Elimination: Goal: Will not experience complications related to bowel motility Outcome: Progressing Goal: Will not experience complications related to urinary retention Outcome: Progressing   Problem: Pain Managment: Goal: General experience of comfort will improve Outcome: Progressing   Problem: Safety: Goal: Ability to remain free from injury will improve Outcome: Progressing   Problem: Skin Integrity: Goal: Risk for impaired skin integrity will decrease Outcome: Progressing

## 2022-10-25 NOTE — Progress Notes (Signed)
PROGRESS NOTE  KATHERYN CULLITON  GUR:[id number] DOB: 1976-02-13 DOA: 10/19/2022 PCP: [name], MD   Brief Narrative: Patient is a 47 year old female with history of anxiety, SVT who presented with complaint of abdominal distention, constipation.  CT abdomen/pelvis showed peritoneal carcinomatosis with extensive metastasis, ascites, multiloculated cystic lesion of the pancreatic tail.  GYN oncology consulted.  Underwent paracentesis with removal of 3.  5 L of fluid.  Tumor markers are elevated CA125, CEA.  GYN oncology following.  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  Medical oncology seeing  her.  Palliative care also consulted regarding poor prognosis.  Assessment & Plan:  Principal Problem:   Peritoneal carcinomatosis ([place]) Active Problems:   S/P cesarean section   Generalized abdominal pain   Constipation   Ovarian mass   Pancreatic mass   Tobacco abuse   Malignant ascites   Protein-calorie malnutrition ([facility])   Abdominal pain secondary to peritoneal carcinomatosis/ascites: Concern of extensive metastatic disease with unknown primary as seen on the CT scan.  CT concerning for bilateral cystic/solid ovarian masses, pancreatic lesion also noted.  Findings concerning for ovarian cancer.  Underwent paracentesis on 2/3 with removal of 3.5 L of fluid. CEA, CA125 elevated, CA 19-9 is normal. Went to cancer center for pelvic examination on 2/7. 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  Medical oncology seeing  her.  Palliative care also consulted regarding poor prognosis.  Ascites: Underwent paracentesis twice, first on 2/3 and second on 2/6.  Continue monitoring for further paracentesis.  Continue supportive care.  Started on spironolactone, Lasix  Microcytic anemia/iron deficiency/folic acid deficiency: Continue iron supplement, folic acid  supplement.  Moderate protein calorie malnutrition: Nutritionist consulted and following  Hypokalemia/hypomagnesemia: Currently being monitored and supplemented as needed  Constipation: Lack of bowel movement for last several days.  Continue aggressive bowel regimen.  Mineral oil enema added today  Bilateral lower extremity rash: On knees.  On Benadryl cream for itch  Hypertension: Added amlodipine 5 mg daily   Nutrition Problem: Increased nutrient needs Etiology: cancer and cancer related treatments    DVT prophylaxis:enoxaparin (LOVENOX) injection 40 mg Start: 10/20/22 1415     Code Status: Full Code  Family Communication: None at the bedside  Patient status: Inpatient  Patient is from : Home  Anticipated discharge to: Home  Estimated DC date: After full workup/oncology/palliative care pending   Consultants: GYN oncology  Procedures: Paracentesis  Antimicrobials:  Anti-infectives (From admission, onward)    None       Subjective: Patient seen and examined at bedside today.  Hemodynamically stable.  Complains of abdominal distention today.  No bowel movement for last several days.  Denies any significant abdominal pain.  No nausea or vomiting.  Objective: Vitals:   10/24/22 1328 10/24/22 2024 10/24/22 2106 10/25/22 0534  BP: (!) 148/98 (!) 155/105 (!) 134/100 (!) 151/92  Pulse: 99 (!) 119 (!) 110 (!) 104  Resp: '20 19 18 18  '$ Temp: 98.3 F (36.8 C) 98 F (36.7 C) 97.8 F (36.6 C) 98.1 F (36.7 C)  TempSrc: Oral Oral Oral Oral  SpO2: 98% 99% 98% 99%  Weight:      Height:        Intake/Output Summary (Last 24 hours) at 10/25/2022 1113 Last data filed at 10/25/2022 1004 Gross per 24 hour  Intake 320 ml  Output --  Net 320 ml   Filed Weights   10/19/22 1156  Weight: 69.4 kg    Examination:  General exam: Overall comfortable, not in distress HEENT: PERRL Respiratory system:  no wheezes or crackles  Cardiovascular system: S1 & S2 heard, RRR.   Gastrointestinal system: Abdomen is distended, soft and mostly nontender. Central nervous system: Alert and oriented Extremities: No edema, no clubbing ,no cyanosis Skin: No rashes, no ulcers,no icterus     Data Reviewed: I have personally reviewed following labs and imaging studies  CBC: Recent Labs  Lab 10/20/22 0836 10/21/22 0617 10/22/22 0533 10/23/22 0523 10/24/22 0613  WBC 11.9* 9.6 9.5 9.7 8.8  HGB 9.5* 9.0* 8.6* 8.7* 8.8*  HCT 30.4* 29.5* 27.9* 28.5* 30.9*  MCV 75.4* 76.0* 75.6* 76.2* 80.3  PLT 471* 381 427* 462* 053*   Basic Metabolic Panel: Recent Labs  Lab 10/20/22 0836 10/21/22 0617 10/22/22 0533 10/23/22 0523 10/24/22 0613  NA 132* 133* 130* 134* 131*  K 3.4* 3.4* 4.1 4.0 4.5  CL 91* 95* 93* 95* 93*  CO2 '29 27 28 29 27  '$ GLUCOSE 136* 138* 146* 150* 144*  BUN '13 12 10 12 14  '$ CREATININE 0.71 0.66 0.54 0.56 0.60  CALCIUM 8.9 8.2* 8.4* 8.8* 8.6*  MG  --  1.9 2.0 2.0 1.8  PHOS  --   --  3.5  --   --      Recent Results (from the past 240 hour(s))  Fungus Culture With Stain     Status: None (Preliminary result)   Collection Time: 10/20/22  9:45 AM   Specimen: PATH Cytology Peritoneal fluid  Result Value Ref Range Status   Fungus Stain Final report  Final    Comment: (NOTE) Performed At: [facility] [address] [place], [place] [id number] Rush Farmer MD IO:[id number]    Fungus (Mycology) Culture PENDING  Incomplete   Fungal Source PERITONEAL  Final    Comment: Performed at [facility], [place] [address], [place], [place] [address]  Aerobic/Anaerobic Culture w Gram Stain (surgical/deep wound)     Status: None (Preliminary result)   Collection Time: 10/20/22  9:45 AM   Specimen: PATH Cytology Peritoneal fluid  Result Value Ref Range Status   Specimen Description   Final    PERITONEAL Performed at [facility] [address], [place], [place] [address]    Special Requests   Final     NONE Performed at [facility], [place] [address], [place], [place] [address]    Gram Stain   Final    RARE WBC PRESENT, PREDOMINANTLY PMN NO ORGANISMS SEEN    Culture   Final    NO GROWTH 4 DAYS NO ANAEROBES ISOLATED; CULTURE IN PROGRESS FOR 5 DAYS Performed at [facility] [address], [place], [place] [address]    Report Status PENDING  Incomplete  Fungus Culture Result     Status: None   Collection Time: 10/20/22  9:45 AM  Result Value Ref Range Status   Result 1 Comment  Final    Comment: (NOTE) KOH/Calcofluor preparation:  no fungus observed. Performed At: [facility] [place], [place] [id number] Rush Farmer MD YJ:[id number]      Radiology Studies: US Paracentesis  Result Date: 10/23/2022 INDICATION: Patient with history of abdominal pain, peritoneal carcinomatosis, cystic and solid masses of bilateral ovaries, cystic lesion pancreatic tail, recurrent ascites. Request received for therapeutic paracentesis. EXAM: ULTRASOUND GUIDED THERAPEUTIC PARACENTESIS MEDICATIONS: 8 mL 1% lidocaine COMPLICATIONS: None immediate. PROCEDURE: Informed written consent was obtained from  the patient after a discussion of the risks, benefits and alternatives to treatment. A timeout was performed prior to the initiation of the procedure. Initial ultrasound scanning demonstrates a moderate amount of ascites within the left lower abdominal quadrant. The left lower abdomen was prepped and draped in the usual sterile fashion. 1% lidocaine was used for local anesthesia. Following this, a 19 gauge, 7-cm, Yueh catheter was introduced. An ultrasound image was saved for documentation purposes. The paracentesis was performed. The catheter was removed and a dressing was applied. The patient tolerated the procedure well without immediate post procedural complication. FINDINGS: A total of approximately 3.1 liters of yellow fluid was removed. IMPRESSION: Successful  ultrasound-guided therapeutic paracentesis yielding 3.1 liters of peritoneal fluid. Read by: Rowe Robert, PA-C Electronically Signed   By: Markus Daft M.D.   On: 10/23/2022 16:47    Scheduled Meds:  (feeding supplement) PROSource Plus  30 mL Oral BID BM   amLODipine  5 mg Oral Daily   docusate sodium  100 mg Oral BID   enoxaparin (LOVENOX) injection  40 mg Subcutaneous Daily   feeding supplement  1 Container Oral TID BM   ferrous sulfate  325 mg Oral Q breakfast   folic acid  1 mg Oral Daily   furosemide  20 mg Oral Daily   mineral oil  1 enema Rectal Once   multivitamin with minerals  1 tablet Oral Daily   polyethylene glycol  17 g Oral BID   senna-docusate  2 tablet Oral QHS   spironolactone  12.5 mg Oral Daily   Continuous Infusions:   LOS: 5 days   Shelly Coss, MD [facility] P2/04/2023, 11:13 AM

## 2022-10-25 NOTE — Consult Note (Signed)
Referring Provider: Dr. Adhikari Primary Care Physician:  Wilson, Fred H, MD Primary Gastroenterologist:  Unassigned  Reason for Consultation:  Abdominal pain; Abnormal imaging  HPI: Christina [name] is a 46 y.o. female with several months of worsening abdominal distention, abdominal pain, constipation, and 40 pound unintentional weight loss in the past 2 months. Denies melena, hematochezia, N/V. CT shows peritoneal carcinomatosis with mets and concern for ovarian involvement and pancreatic lesions. 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 Occasional alcohol. Denies NSAIDs. Has not had an EGD or colonoscopy. Denies family history of stomach or colon cancer.  Past Medical History:  Diagnosis Date   Anxiety    Depression    Gestational diabetes mellitus, antepartum 2015   managed with diet   Hypertension    on meds until wt loss   PONV (postoperative nausea and vomiting)    Seizures ([facility]) 2001   stressed induced; 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    Past Surgical History:  Procedure Laterality Date   APPENDECTOMY     CESAREAN SECTION     CESAREAN SECTION N/A 07/14/2014   Procedure: CESAREAN SECTION;  Surgeon: Todd Meisinger, MD;  Location: [facility];  Service: Obstetrics;  Laterality: N/A;   CESAREAN SECTION N/A 05/23/2015   Procedure: CESAREAN SECTION;  Surgeon: Todd Meisinger, MD;  Location: [facility];  Service: Obstetrics;  Laterality: N/A;   LAPAROSCOPIC BILATERAL SALPINGECTOMY Bilateral 06/12/2016   Procedure: LAPAROSCOPIC BILATERAL SALPINGECTOMY;  Surgeon: John V Ferguson, MD;  Location: [facility];  Service: Gynecology;  Laterality: Bilateral;    Prior to Admission medications   Medication Sig Start Date End Date Taking? Authorizing Provider  acetaminophen (TYLENOL) 500 MG tablet Take 500-1,000 mg by mouth 3 (three) times daily as needed for mild pain or moderate pain.   Yes [provider]  dicyclomine (BENTYL) 20 MG tablet Take 20 mg  by mouth as needed for spasms. 07/18/22  Yes [provider]  Menthol, Topical Analgesic, (ICY HOT EX) Apply 1 application  topically as needed (pain).   Yes [provider]  OVER THE COUNTER MEDICATION Take 2 Bars by mouth as needed (constipation). Exlax Bars   Yes [provider]  polyethylene glycol (MIRALAX / GLYCOLAX) 17 g packet Take 17 g by mouth daily as needed for moderate constipation or severe constipation.   Yes [provider]  Simethicone (GAS-X PO) Take 2 tablets by mouth as needed (bloat).   Yes [provider]  Sodium Phosphates (FLEET ENEMA RE) Place 1 Dose rectally as needed (consttipation).   Yes [provider]    Scheduled Meds:  (feeding supplement) PROSource Plus  30 mL Oral BID BM   amLODipine  5 mg Oral Daily   docusate sodium  100 mg Oral BID   enoxaparin (LOVENOX) injection  40 mg Subcutaneous Daily   feeding supplement  1 Container Oral TID BM   ferrous sulfate  325 mg Oral Q breakfast   folic acid  1 mg Oral Daily   furosemide  40 mg Oral Daily   mineral oil  1 enema Rectal Once   multivitamin with minerals  1 tablet Oral Daily   polyethylene glycol  17 g Oral BID   senna-docusate  2 tablet Oral QHS   spironolactone  25 mg Oral Daily   Continuous Infusions: PRN Meds:.acetaminophen, alum & mag hydroxide-simeth, diphenhydrAMINE-zinc acetate, guaiFENesin, hydrALAZINE, HYDROcodone-acetaminophen, HYDROmorphone (DILAUDID) injection, ipratropium-albuterol, metoprolol tartrate, naLOXone (NARCAN)  injection, ondansetron (ZOFRAN) IV, traZODone  Allergies as of   10/19/2022 - Review Complete 10/19/2022  Allergen Reaction Noted   Claritin [loratadine] Other (See Comments) 09/02/2012   Diphenhydramine Other (See Comments) 09/02/2012    Family History  Problem Relation Age of Onset   Cancer Father    Hypertension Mother    Diabetes Mother    Autism Son     Social History   Socioeconomic History   Marital  status: Legally Separated    Spouse name: Not on file   Number of children: Not on file   Years of education: Not on file   Highest education level: Not on file  Occupational History   Not on file  Tobacco Use   Smoking status: Every Day    Packs/day: 1.00    Years: 12.00    Total pack years: 12.00    Types: Cigarettes   Smokeless tobacco: Never  Substance and Sexual Activity   Alcohol use: Yes    Comment: rarely   Drug use: No    Comment: not during pregnancy   Sexual activity: Not Currently    Partners: Male    Birth control/protection: None  Other Topics Concern   Not on file  Social History Narrative   Not on file   Social Determinants of Health   Financial Resource Strain: Not on file  Food Insecurity: No Food Insecurity (10/20/2022)   Hunger Vital Sign    Worried About Running Out of Food in the Last Year: Never true    Ran Out of Food in the Last Year: Never true  Transportation Needs: No Transportation Needs (10/20/2022)   PRAPARE - Transportation    Lack of Transportation (Medical): No    Lack of Transportation (Non-Medical): No  Physical Activity: Not on file  Stress: Not on file  Social Connections: Not on file  Intimate Partner Violence: Not At Risk (10/20/2022)   Humiliation, Afraid, Rape, and Kick questionnaire    Fear of Current or Ex-Partner: No    Emotionally Abused: No    Physically Abused: No    Sexually Abused: No    Review of Systems: All negative except as stated above in HPI.  Physical Exam: Vital signs: Vitals:   10/25/22 0534 10/25/22 1439  BP: (!) 151/92 (!) 147/99  Pulse: (!) 104 (!) 101  Resp: 18 18  Temp: 98.1 F (36.7 C)   SpO2: 99% 99%   Last BM Date : 11/13/22 General:  Lethargic, thin, pleasant and cooperative in NAD Head: normocephalic, atraumatic Eyes: anicteric sclera ENT: oropharynx clear Neck: supple, nontender Lungs:  Clear throughout to auscultation.   No wheezes, crackles, or rhonchi. No acute distress. Heart:   Regular rate and rhythm; no murmurs, clicks, rubs,  or gallops. Abdomen: severe upper quadrant distention; diffuse tenderness (worse in right mid-quadrant) with guarding, +BS  Rectal:  Deferred Ext: no edema  GI:  Lab Results: Recent Labs    10/23/22 0523 10/24/22 0613  WBC 9.7 8.8  HGB 8.7* 8.8*  HCT 28.5* 30.9*  PLT 462* 439*   BMET Recent Labs    10/23/22 0523 10/24/22 0613  NA 134* 131*  K 4.0 4.5  CL 95* 93*  CO2 29 27  GLUCOSE 150* 144*  BUN 12 14  CREATININE 0.56 0.60  CALCIUM 8.8* 8.6*   LFT Recent Labs    10/24/22 0613  PROT 5.7*  ALBUMIN 2.1*  AST 14*  ALT 16  ALKPHOS 82  BILITOT 0.3   PT/INR No results for input(s): "LABPROT", "INR" in the last 72 hours.     Studies/Results: No results found.  Impression/Plan: Metastatic disease with unknown primary in need of EGD to evaluate for a gastric, duodenal, or esophageal primary and if unrevealing then will need an EUS to evaluate the pancreas. Clear liquid diet. NPO p MN. Supportive care. EGD tomorrow afternoon at 1 pm.    LOS: 5 days   Ovid Witman C Kensley Valladares  10/25/2022, 4:00 PM  Questions please call [phone]  

## 2022-10-25 NOTE — Progress Notes (Signed)
Gynecologic Oncology Progress Note  Christina [name] is a 47 y.o. woman currently admitted with ascites, bilateral cystic and solid ovarian masses, and pancreatic lesion noted on recent CT imaging. 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 CA 125 at 314 CEA at 84 CA 19-9 at 25  Subjective: Patient reports throwing up yesterday. Nausea intermittently and this am when attempting to eat breakfast. Having challenging drinking ensure and boost. Swelling noted in her hips. Only had small amount of stool come out after suppository. Passing flatus. Diagnosis discussed with patient by Dr. Berline Lopes. Patient would like to see her kids if possible.  Objective: Vital signs in last 24 hours: Temp:  [97.8 F (36.6 C)-98.3 F (36.8 C)] 98.1 F (36.7 C) (02/08 0534) Pulse Rate:  [99-119] 104 (02/08 0534) Resp:  [18-20] 18 (02/08 0534) BP: (134-155)/(92-105) 151/92 (02/08 0534) SpO2:  [98 %-99 %] 99 % (02/08 0534) Last BM Date : 11/13/22  Intake/Output from previous day: 02/07 0701 - 02/08 0700 In: 320 [P.O.:320] Out: 0   Physical Examination: Deferred. Pt in emotional distress after discussing cytology results/diagnosis.     10/25/2022    5:34 AM 10/24/2022    9:06 PM 10/24/2022    8:24 PM  Vitals with BMI  Systolic 397 673 419  Diastolic 92 379 024  Pulse 104 110 119     Assessment: 47 y.o. currently admitted with ascites, bilateral cystic and solid ovarian masses, and pancreatic lesion noted on recent CT imaging. Cytology from recent paracentesis returning with adenocarcinoma, suggestive of upper GI or pancreatobiliary primary. 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   Pain:  Pain is well-controlled on PRN medications.  Heme: Hgb 8.8 and Hct 30.9 10/24/22 am. Stable compared with previous values.  ID: WBC 8.8 on 10/24/22. No evidence of infection at this time. Afebrile.  CV:  BP and HR overall stable, BP slightly elevated at times. Continue to monitor with ordered vital signs while inpatient.  GI:  Tolerating po: intermittent nausea, emesis on 2/6 and 2/7. Antiemetics ordered as needed.  GU: Creatinine 0.60 10/24/22 am. Pt reporting adequate output.    FEN: No critical values on am labs from 10/24/22.  Prophylaxis: Lovenox ordered.  Plan: -Recommend GI Medical Oncology consultation. Dr. Berline Lopes spoke with Dr. Burr Medico. -Palliative care consult discussed. -Order placed for patient to leave the floor to see children. AC and charge RN aware. -Continue with plan of care per Hospitalist Team   LOS: 5 days    Dorothyann Gibbs 10/25/2022, 8:01 AM

## 2022-10-25 NOTE — Progress Notes (Addendum)
Notified by Radiologist of incidental finding on CT chest concerning for  Small embolus of the proximal right lower lobar pulmonary artery. No evidence of right heart strain.   #Acute Small Right Lung PE  CTA chest demonstrated acute  small proximal right lower lobe PE without evidence of right heart strain. As far as etiology of PE,+ Hypercoaguable: Malignancy is a risk factor. Currently hemodynamically stable with no respiratory issues -will start Heparin gtt with plan to stop prior to EGD tomorrow scheduled at 1pm. Spoke with GI oncall Dr. Randel Pigg who recommend holding heparin gtt 6 hours before the procedure.      Rufina Falco, DNP, CCRN, FNP-C, AGACNP-BC Acute Care & Family Nurse Practitioner  [facility]  See Amion for personal pager PCCM on call pager [phone] until 7 am

## 2022-10-25 NOTE — Progress Notes (Signed)
IR was requested for image guided omental caking biopsy on 2/3.   Case was reviewed by Dr. Maryelizabeth Kaufmann, biopsy could be challenging/risky given the caking proximity to the bowel, recommended to follow cytology from the peritoneal fluid.   Cytology came back as malignancy today, discussed with Dr. Burr Medico and OK to cancel the biopsy.    Please call IR for questions and concerns.   Armando Gang Angelito Hopping PA-C 10/25/2022 2:04 PM

## 2022-10-26 ENCOUNTER — Inpatient Hospital Stay (HOSPITAL_COMMUNITY): Payer: Medicaid Other

## 2022-10-26 ENCOUNTER — Inpatient Hospital Stay (HOSPITAL_COMMUNITY): Payer: Medicaid Other | Admitting: Anesthesiology

## 2022-10-26 ENCOUNTER — Encounter (HOSPITAL_COMMUNITY): Payer: Self-pay | Admitting: Internal Medicine

## 2022-10-26 ENCOUNTER — Encounter (HOSPITAL_COMMUNITY)
Admission: EM | Disposition: A | Discharge status: Home / Self Care | Payer: Self-pay | Source: Home / Self Care | Attending: Internal Medicine

## 2022-10-26 DIAGNOSIS — F17210 Nicotine dependence, cigarettes, uncomplicated: Secondary | ICD-10-CM

## 2022-10-26 DIAGNOSIS — I10 Essential (primary) hypertension: Secondary | ICD-10-CM

## 2022-10-26 DIAGNOSIS — K2970 Gastritis, unspecified, without bleeding: Secondary | ICD-10-CM

## 2022-10-26 DIAGNOSIS — K3189 Other diseases of stomach and duodenum: Secondary | ICD-10-CM

## 2022-10-26 DIAGNOSIS — K2100 Gastro-esophageal reflux disease with esophagitis, without bleeding: Secondary | ICD-10-CM

## 2022-10-26 DIAGNOSIS — C786 Secondary malignant neoplasm of retroperitoneum and peritoneum: Secondary | ICD-10-CM | POA: Diagnosis not present

## 2022-10-26 HISTORY — PX: BIOPSY: SHX5522

## 2022-10-26 HISTORY — PX: IR IMAGING GUIDED PORT INSERTION: IMG5740

## 2022-10-26 HISTORY — PX: ESOPHAGOGASTRODUODENOSCOPY: SHX5428

## 2022-10-26 LAB — BASIC METABOLIC PANEL
Anion gap: 10 (ref 5–15)
BUN: 22 mg/dL — ABNORMAL HIGH (ref 6–20)
CO2: 28 mmol/L (ref 22–32)
Calcium: 8.4 mg/dL — ABNORMAL LOW (ref 8.9–10.3)
Chloride: 90 mmol/L — ABNORMAL LOW (ref 98–111)
Creatinine, Ser: 0.69 mg/dL (ref 0.44–1.00)
GFR, Estimated: >60 mL/min (ref >60)
Glucose, Bld: 140 mg/dL — ABNORMAL HIGH (ref 70–99)
Potassium: 4.6 mmol/L (ref 3.5–5.1)
Sodium: 128 mmol/L — ABNORMAL LOW (ref 135–145)

## 2022-10-26 LAB — HEPARIN LEVEL (UNFRACTIONATED): Heparin Unfractionated: 0.13 IU/mL — ABNORMAL LOW (ref 0.30–0.70)

## 2022-10-26 LAB — CBC
HCT: 28.3 % — ABNORMAL LOW (ref 36.0–46.0)
Hemoglobin: 8.8 g/dL — ABNORMAL LOW (ref 12.0–15.0)
MCH: 23 pg — ABNORMAL LOW (ref 26.0–34.0)
MCHC: 31.1 g/dL (ref 30.0–36.0)
MCV: 74.1 fL — ABNORMAL LOW (ref 80.0–100.0)
Platelets: 517 10*3/uL — ABNORMAL HIGH (ref 150–400)
RBC: 3.82 MIL/uL — ABNORMAL LOW (ref 3.87–5.11)
RDW: 16.2 % — ABNORMAL HIGH (ref 11.5–15.5)
WBC: 10.7 10*3/uL — ABNORMAL HIGH (ref 4.0–10.5)
nRBC: 0 % (ref 0.0–0.2)

## 2022-10-26 LAB — TOTAL BILIRUBIN, BODY FLUID: Total bilirubin, fluid: 0.3 mg/dL

## 2022-10-26 SURGERY — EGD (ESOPHAGOGASTRODUODENOSCOPY)
Anesthesia: Monitor Anesthesia Care

## 2022-10-26 MED ORDER — ALBUMIN HUMAN 5 % IV SOLN: INTRAVENOUS | Status: DC | PRN

## 2022-10-26 MED ORDER — LIDOCAINE 2% (20 MG/ML) 5 ML SYRINGE
INTRAMUSCULAR | Status: DC | PRN
  Administered 2022-10-26: 50 mg via INTRAVENOUS

## 2022-10-26 MED ORDER — LACTATED RINGERS IV SOLN: INTRAVENOUS | Status: DC | PRN

## 2022-10-26 MED ORDER — LIDOCAINE HCL 1 % IJ SOLN
INTRAMUSCULAR | Status: AC
  Administered 2022-10-26: 20 mL
  Filled 2022-10-26: qty 20

## 2022-10-26 MED ORDER — DEXAMETHASONE SODIUM PHOSPHATE 10 MG/ML IJ SOLN
INTRAMUSCULAR | Status: DC | PRN
  Administered 2022-10-26: 10 mg via INTRAVENOUS

## 2022-10-26 MED ORDER — MIDAZOLAM HCL 2 MG/2ML IJ SOLN
INTRAMUSCULAR | Status: AC | PRN
  Administered 2022-10-26 (×2): 1 mg via INTRAVENOUS

## 2022-10-26 MED ORDER — PROPOFOL 500 MG/50ML IV EMUL
INTRAVENOUS | Status: AC
  Filled 2022-10-26: qty 50

## 2022-10-26 MED ORDER — ONDANSETRON HCL 4 MG/2ML IJ SOLN
INTRAMUSCULAR | Status: DC | PRN
  Administered 2022-10-26: 4 mg via INTRAVENOUS

## 2022-10-26 MED ORDER — FENTANYL CITRATE (PF) 100 MCG/2ML IJ SOLN
INTRAMUSCULAR | Status: AC | PRN
  Administered 2022-10-26 (×2): 50 ug via INTRAVENOUS

## 2022-10-26 MED ORDER — SUCCINYLCHOLINE CHLORIDE 200 MG/10ML IV SOSY
PREFILLED_SYRINGE | INTRAVENOUS | Status: DC | PRN
  Administered 2022-10-26: 120 mg via INTRAVENOUS

## 2022-10-26 MED ORDER — MIDAZOLAM HCL 2 MG/2ML IJ SOLN
INTRAMUSCULAR | Status: AC
  Filled 2022-10-26: qty 2

## 2022-10-26 MED ORDER — ONDANSETRON HCL 4 MG/2ML IJ SOLN
INTRAMUSCULAR | Status: AC
  Filled 2022-10-26: qty 2

## 2022-10-26 MED ORDER — HEPARIN (PORCINE) 25000 UT/250ML-% IV SOLN
1700.0000 [IU]/h | INTRAVENOUS | Status: DC
  Administered 2022-10-26: 1250 [IU]/h via INTRAVENOUS
  Administered 2022-10-27: 1450 [IU]/h via INTRAVENOUS
  Administered 2022-10-28 – 2022-10-30 (×3): 1750 [IU]/h via INTRAVENOUS
  Administered 2022-10-30 – 2022-11-02 (×5): 1800 [IU]/h via INTRAVENOUS
  Filled 2022-10-26 (×12): qty 250

## 2022-10-26 MED ORDER — AMLODIPINE BESYLATE 10 MG PO TABS
10.0000 mg | ORAL_TABLET | Freq: Every day | ORAL | Status: DC
  Administered 2022-10-27 – 2022-11-17 (×19): 10 mg via ORAL
  Filled 2022-10-26 (×18): qty 1

## 2022-10-26 MED ORDER — CYCLOBENZAPRINE HCL 5 MG PO TABS
5.0000 mg | ORAL_TABLET | Freq: Three times a day (TID) | ORAL | Status: DC | PRN
  Administered 2022-10-27: 5 mg via ORAL
  Filled 2022-10-26: qty 1

## 2022-10-26 MED ORDER — ONDANSETRON HCL 4 MG/2ML IJ SOLN
4.0000 mg | Freq: Three times a day (TID) | INTRAMUSCULAR | Status: AC
  Administered 2022-10-26 – 2022-10-28 (×6): 4 mg via INTRAVENOUS
  Filled 2022-10-26 (×6): qty 2

## 2022-10-26 MED ORDER — ETOMIDATE 2 MG/ML IV SOLN
INTRAVENOUS | Status: DC | PRN
  Administered 2022-10-26: 18 mg via INTRAVENOUS

## 2022-10-26 MED ORDER — FENTANYL CITRATE (PF) 100 MCG/2ML IJ SOLN
INTRAMUSCULAR | Status: AC
  Filled 2022-10-26: qty 2

## 2022-10-26 NOTE — Op Note (Signed)
[facility] Patient Name: Christina [name] Procedure Date: 10/26/2022 MRN: [id number] Attending MD: [name] , MD, [id number] Date of Birth: 02/28/1976 CSN: [id number] Age: 47 Admit Type: Inpatient Procedure:                Upper GI endoscopy Indications:              Generalized abdominal pain, Abnormal CT of the GI                            tract, Metastatic cancer (unknown primary) Providers:                [name], MD, Fanny Skates RN, RN,                            Luan Moore, Technician, Virgia Land, CRNA Referring MD:             hospital team Medicines:                Propofol per Anesthesia, Monitored Anesthesia Care Complications:            No immediate complications. Estimated Blood Loss:     Estimated blood loss was minimal. Procedure:                Pre-Anesthesia Assessment:                           - Prior to the procedure, a History and Physical                            was performed, and patient medications and                            allergies were reviewed. The patient's tolerance of                            previous anesthesia was also reviewed. The risks                            and benefits of the procedure and the sedation                            options and risks were discussed with the patient.                            All questions were answered, and informed consent                            was obtained. Prior Anticoagulants: The patient has                            taken no anticoagulant or antiplatelet agents. ASA                            Grade Assessment: IV - A patient with severe  systemic disease that is a constant threat to life.                            After reviewing the risks and benefits, the patient                            was deemed in satisfactory condition to undergo the                            procedure.                           After obtaining  informed consent, the endoscope was                            passed under direct vision. Throughout the                            procedure, the patient's blood pressure, pulse, and                            oxygen saturations were monitored continuously. The                            [id number] Olympus endoscope was introduced                            through the mouth, and advanced to the second part                            of duodenum. The upper GI endoscopy was                            accomplished without difficulty. The patient                            tolerated the procedure fairly well. Scope In: Scope Out: Findings:      LA Grade B (one or more mucosal breaks greater than 5 mm, not extending       between the tops of two mucosal folds) esophagitis with no bleeding was       found in the distal esophagus.      The Z-line was regular and was found 40 cm from the incisors.      Extrinsic compression on the stomach was found in the gastric antrum.      Segmental moderate inflammation characterized by congestion (edema) and       erythema was found in the gastric antrum. Biopsies were taken with a       cold forceps for histology. Estimated blood loss was minimal.      The exam of the stomach was otherwise normal.      The examined duodenum was normal. Impression:               - LA Grade B reflux esophagitis with no bleeding.                           -  Z-line regular, 40 cm from the incisors.                           - Extrinsic compression in the gastric antrum.                           - Gastritis. Biopsied.                           - Normal examined duodenum. Moderate Sedation:      N/A - MAC procedure Recommendation:           - Clear liquid diet.                           - Await pathology results.                           - Perform an upper endoscopic ultrasound (UEUS) at                            appointment to be scheduled. Procedure Code(s):         --- Professional ---                           [phone], Esophagogastroduodenoscopy, flexible,                            transoral; with biopsy, single or multiple Diagnosis Code(s):        --- Professional ---                           R10.84, Generalized abdominal pain                           R93.3, Abnormal findings on diagnostic imaging of                            other parts of digestive tract                           K29.70, Gastritis, unspecified, without bleeding                           K21.00, Gastro-esophageal reflux disease with                            esophagitis, without bleeding                           K31.89, Other diseases of stomach and duodenum CPT copyright 2022 American [employer] Association. All rights reserved. The codes documented in this report are preliminary and upon coder review may  be revised to meet current compliance requirements. [name], MD 10/26/2022 1:08:37 PM This report has been signed electronically. Number of Addenda: 0

## 2022-10-26 NOTE — Progress Notes (Signed)
ANTICOAGULATION CONSULT NOTE - follow up  Pharmacy Consult for Heparin Indication: pulmonary embolus  Allergies  Allergen Reactions   Claritin [Loratadine] Other (See Comments)    causes spasms   Diphenhydramine Other (See Comments)    Causes spasms and keeps alert for days   Lorazepam Other (See Comments)    After a few doses it made her feel paranoid    Patient Measurements: Height: 4' 11"$  (149.9 cm) Weight: 69.4 kg (153 lb) IBW/kg (Calculated) : 43.2 Heparin Dosing Weight: 59kg  Vital Signs: Temp: 98 F (36.7 C) (02/08 1948) Temp Source: Oral (02/08 1948) BP: 149/96 (02/08 1948) Pulse Rate: 102 (02/08 1948)  Labs: Recent Labs    10/23/22 0523 10/24/22 0613 10/25/22 2038 10/26/22 0427  HGB 8.7* 8.8* 9.2* 8.8*  HCT 28.5* 30.9* 29.6* 28.3*  PLT 462* 439* 558* 517*  HEPARINUNFRC  --   --   --  0.13*  CREATININE 0.56 0.60  --   --      Estimated Creatinine Clearance: 72.9 mL/min (by C-G formula based on SCr of 0.6 mg/dL).   Medical History: Past Medical History:  Diagnosis Date   Anxiety    Depression    Gestational diabetes mellitus, antepartum 2015   managed with diet   Hypertension    on meds until wt loss   PONV (postoperative nausea and vomiting)    Seizures ([place]) 2001   stressed induced; 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    Medications:  Scheduled:   (feeding supplement) PROSource Plus  30 mL Oral BID BM   amLODipine  5 mg Oral Daily   docusate sodium  100 mg Oral BID   feeding supplement  1 Container Oral TID BM   ferrous sulfate  325 mg Oral Q breakfast   folic acid  1 mg Oral Daily   furosemide  40 mg Oral Daily   mineral oil  1 enema Rectal Once   multivitamin with minerals  1 tablet Oral Daily   polyethylene glycol  17 g Oral BID   senna-docusate  2 tablet Oral QHS   sodium chloride (PF)       spironolactone  25 mg Oral Daily   Infusions:   heparin 1,050 Units/hr (10/25/22 2021)    Assessment: 53 yoF admitted on 2/2 with  new finding of metastatic peritoneal carcinomatosis/ascites.  Scheduled for EGD 2/9 by GI to evaluate for primary source.  CT chest on 2/8 showed incidental finding of small PE.  Pharmacy is consulted to dose heparin IV.  No prior to admission anticoagulation Lovenox 38m SQ daily ordered for inpatient VTE prophylaxis (2/3-last dose on 2/8 at 12:00) Baseline PT/INR wnl on 2/3 CBC: Hgb low at 8.8, Plt elevated at 439k  10/26/2022 HL 0.13 subtherapeutic on 1050 units/hr Hgb 8.8, plts high Per RN no bleeding   Goal of Therapy:  Heparin level 0.3-0.7 units/ml Monitor platelets by anticoagulation protocol: Yes   Plan:  Increase heparin drip to 1250 units/hr Daily heparin level and CBC Per GI, hold heparin for 6 hours prior to endoscopy scheduled for 2/9 at 13:00.  Stop time 07:00 entered, RN aware.     EDolly RiasRPh 10/26/2022, 5:08 AM

## 2022-10-26 NOTE — Progress Notes (Signed)
PROGRESS NOTE  Christina [name]  [id number] DOB: Jan 16, 1976 DOA: 10/19/2022 PCP: [name], MD   Brief Narrative: Patient is a 47 year old female with history of anxiety, SVT who presented with complaint of abdominal distention, constipation.  CT abdomen/pelvis showed peritoneal carcinomatosis with extensive metastasis, ascites, multiloculated cystic lesion of the pancreatic tail.  GYN oncology consulted.  Underwent paracentesis twice.  Tumor markers are elevated :CA125, CEA. . 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  Oncology, GI following.  Plan for EGD today.  Hospital course remarkable for persistent abdominal pain, distention, constipation  Assessment & Plan:  Principal Problem:   Peritoneal carcinomatosis ([place]) Active Problems:   S/P cesarean section   Generalized abdominal pain   Constipation   Ovarian mass   Pancreatic mass   Tobacco abuse   Malignant ascites   Protein-calorie malnutrition (HCC)   Abdominal pain secondary to peritoneal carcinomatosis/ascites: Concern of extensive metastatic disease with unknown primary as seen on the CT scan.  CT concerning for bilateral cystic/solid ovarian masses, pancreatic lesion also noted.  CEA, CA125 elevated, CA 19-9 is normal. Went to cancer center for pelvic examination on 2/7. 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  Medical oncology seeing  her.  Palliative care also consulted regarding poor prognosis.  GI consulted for need of EGD for tissue sampling and locating other cancer areas  Ascites: Underwent paracentesis twice, first on 2/3 and second on 2/6.  Continue monitoring for further paracentesis.  Continue supportive care.  Started on spironolactone, Lasix. She may need another paracentesis soon.  PE: Asymptomatic.  CT chest done with contrast for staging purpose showed small embolus  on the proximal right lower lobe pulmonary artery.  Started on heparin drip.  Will change Eliquis when appropriate.  She is on room air  Microcytic anemia/iron deficiency/folic acid deficiency: Continue iron supplement, folic acid supplement.  Moderate protein calorie malnutrition: Nutritionist consulted and following.  She continues to have poor oral intake.  In that case she might need TPN  Hypokalemia/hypomagnesemia: Currently being monitored and supplemented as needed  Constipation/abdominal distention: Lack of bowel movement for last several days.  Continue aggressive bowel regimen.  Enema ordered.  Abdominal x-ray done today did not show any bowel obstruction.  She has bowel sounds  Bilateral lower extremity rash: On knees.  On Benadryl cream for itch  Hypertension: Continue amlodipine 10 mg daily.  Nutrition Problem: Increased nutrient needs Etiology: cancer and cancer related treatments    DVT prophylaxis:     Code Status: Full Code  Family Communication: Sister at the bedside  Patient status: Inpatient  Patient is from : Home  Anticipated discharge to: Home  Estimated DC date: After full workup   Consultants: GYN oncology  Procedures: Paracentesis  Antimicrobials:  Anti-infectives (From admission, onward)    None       Subjective: Patient seen and examined at bedside today.  She was uncomfortable due to abdominal distention and nausea.  No bowel movement yet.  She refused enema yesterday.  She is willing to take it today.  She has good bowel sounds.  Continues to have poor oral intake.  Objective: Vitals:   10/25/22 0534 10/25/22 1439 10/25/22 1948 10/26/22 0544  BP: (!) 151/92 (!) 147/99 (!) 149/96 (!) 146/97  Pulse: (!) 104 (!) 101 (!) 102 93  Resp: 18 18 18 16  $ Temp: 98.1 F (36.7 C)  98 F (36.7 C) 97.9  F (36.6 C)  TempSrc: Oral  Oral Oral  SpO2: 99% 99% 98% 95%  Weight:      Height:        Intake/Output Summary (Last 24 hours) at  10/26/2022 1106 Last data filed at 10/26/2022 [id number] Gross per 24 hour  Intake 104.35 ml  Output --  Net 104.35 ml   Filed Weights   10/19/22 1156  Weight: 69.4 kg    Examination:   General exam: Uncomfortable due to abdominal discomfort HEENT: PERRL Respiratory system:  no wheezes or crackles  Cardiovascular system: S1 & S2 heard, RRR.  Gastrointestinal system: Abdomen is distended, bowel sounds present ,ascites Central nervous system: Alert and oriented Extremities: No edema, no clubbing ,no cyanosis Skin: No rashes, no ulcers,no icterus     Data Reviewed: I have personally reviewed following labs and imaging studies  CBC: Recent Labs  Lab 10/22/22 0533 10/23/22 0523 10/24/22 0613 10/25/22 2038 10/26/22 0427  WBC 9.5 9.7 8.8 11.8* 10.7*  HGB 8.6* 8.7* 8.8* 9.2* 8.8*  HCT 27.9* 28.5* 30.9* 29.6* 28.3*  MCV 75.6* 76.2* 80.3 74.2* 74.1*  PLT 427* 462* 439* 558* 123XX123*   Basic Metabolic Panel: Recent Labs  Lab 10/21/22 0617 10/22/22 0533 10/23/22 0523 10/24/22 0613 10/26/22 0427  NA 133* 130* 134* 131* 128*  K 3.4* 4.1 4.0 4.5 4.6  CL 95* 93* 95* 93* 90*  CO2 27 28 29 27 28  $ GLUCOSE 138* 146* 150* 144* 140*  BUN 12 10 12 14 $ 22*  CREATININE 0.66 0.54 0.56 0.60 0.69  CALCIUM 8.2* 8.4* 8.8* 8.6* 8.4*  MG 1.9 2.0 2.0 1.8  --   PHOS  --  3.5  --   --   --      Recent Results (from the past 240 hour(s))  Fungus Culture With Stain     Status: None (Preliminary result)   Collection Time: 10/20/22  9:45 AM   Specimen: PATH Cytology Peritoneal fluid  Result Value Ref Range Status   Fungus Stain Final report  Final    Comment: (NOTE) Performed At: [facility] [address] [place], [place] [id number] Rush Farmer MD [id number]    Fungus (Mycology) Culture PENDING  Incomplete   Fungal Source PERITONEAL  Final    Comment: Performed at [facility], [place] [address] [place], [place] [address]  Aerobic/Anaerobic Culture w Gram Stain  (surgical/deep wound)     Status: None   Collection Time: 10/20/22  9:45 AM   Specimen: PATH Cytology Peritoneal fluid  Result Value Ref Range Status   Specimen Description   Final    PERITONEAL Performed at [facility] [address], [place], [place] [address]    Special Requests   Final    NONE Performed at [facility], [place] [address] [place], [place] [address]    Gram Stain   Final    RARE WBC PRESENT, PREDOMINANTLY PMN NO ORGANISMS SEEN    Culture   Final    No growth aerobically or anaerobically. Performed at [facility], [place] [address] [place], [place] [address]    Report Status 10/25/2022 FINAL  Final  Fungus Culture Result     Status: None   Collection Time: 10/20/22  9:45 AM  Result Value Ref Range Status   Result 1 Comment  Final    Comment: (NOTE) KOH/Calcofluor preparation:  no fungus observed. Performed At: [facility] [place], [place] [id number] Rush Farmer MD [id number]  Radiology Studies: DG Abd 1 View  Result Date: 10/26/2022 CLINICAL DATA:  Abdominal distension, newly diagnosed metastatic adenocarcinoma to the peritoneum, paracentesis 3 days ago EXAM: ABDOMEN - 1 VIEW COMPARISON:  Portable exam 0957 hours without priors for comparison FINDINGS: Air-filled proximal half of colon and a few small bowel loops in LEFT mid abdomen. Small amount gas in rectum. No bowel wall thickening or definite obstruction identified. Diffusely increased attenuation in peritoneal cavity consistent with ascites. No osseous abnormalities. IMPRESSION: Ascites. Nonobstructive bowel gas pattern. Electronically Signed   By: Lavonia Dana M.D.   On: 10/26/2022 10:53   CT CHEST W CONTRAST  Result Date: 10/25/2022 CLINICAL DATA:  Occult malignancy. EXAM: CT CHEST WITH CONTRAST TECHNIQUE: Multidetector CT imaging of the chest was performed during intravenous contrast administration. RADIATION DOSE REDUCTION:  This exam was performed according to the departmental dose-optimization program which includes automated exposure control, adjustment of the mA and/or kV according to patient size and/or use of iterative reconstruction technique. CONTRAST:  16m OMNIPAQUE IOHEXOL 300 MG/ML  SOLN COMPARISON:  CT abdomen 10/19/2022 FINDINGS: Cardiovascular: Heart is normal size. Thoracic aorta is normal in caliber. Pulmonary arterial system is adequately opacified and demonstrates small embolus of the proximal right lower lobar pulmonary artery. No evidence of right heart strain. Remaining vascular structures are normal. Mediastinum/Nodes: No mediastinal or hilar adenopathy. Remaining mediastinal structures are normal. Lungs/Pleura: Lungs are adequately inflated. No airspace consolidation or effusion. Airways are normal. Upper Abdomen: Mild to moderate ascites over the upper abdomen unchanged. Remainder of the upper abdomen is unchanged from the recent prior exam. Musculoskeletal: No focal abnormality. IMPRESSION: 1. Small embolus of the proximal right lower lobar pulmonary artery. No evidence of right heart strain. 2. Mild to moderate ascites over the upper abdomen unchanged from the recent prior exam. Critical Value/emergent results were called by telephone at the time of interpretation on 10/25/2022 at 7:10 p.m. to provider Nurse practitioner, ERufina Falco who verbally acknowledged these results. Electronically Signed   By: DMarin OlpM.D.   On: 10/25/2022 19:11    Scheduled Meds:  (feeding supplement) PROSource Plus  30 mL Oral BID BM   amLODipine  5 mg Oral Daily   docusate sodium  100 mg Oral BID   feeding supplement  1 Container Oral TID BM   ferrous sulfate  325 mg Oral Q breakfast   folic acid  1 mg Oral Daily   furosemide  40 mg Oral Daily   mineral oil  1 enema Rectal Once   multivitamin with minerals  1 tablet Oral Daily   ondansetron (ZOFRAN) IV  4 mg Intravenous Q8H   polyethylene glycol  17 g Oral BID    senna-docusate  2 tablet Oral QHS   spironolactone  25 mg Oral Daily   Continuous Infusions:   LOS: 6 days   AShelly Coss MD [facility] P2/05/2023, 11:06 AM

## 2022-10-26 NOTE — Progress Notes (Signed)
Christina [name]   DOB:03/03/1976   [id number]   [id number]  Med/onc follow up note  Subjective: Patient underwent EGD this morning, and port placement this afternoon.  She has not eating much today. Mild nausea, no vomiting.  Objective:  Vitals:   10/26/22 1620 10/26/22 1625  BP: (!) 160/101 (!) 170/101  Pulse: 91 93  Resp: 17 15  Temp:    SpO2: 99% 100%    Body mass index is 30.9 kg/m.  Intake/Output Summary (Last 24 hours) at 10/26/2022 1814 Last data filed at 10/26/2022 1304 Gross per 24 hour  Intake 754.35 ml  Output --  Net 754.35 ml     Sclerae unicteric  Oropharynx clear  No peripheral adenopathy  Lungs clear -- no rales or rhonchi  Heart regular rate and rhythm  Abdomen distended with mild diffuse tenderness.  MSK no focal spinal tenderness, no peripheral edema  Neuro nonfocal    CBG (last 3)  No results for input(s): "GLUCAP" in the last 72 hours.   Labs:   Urine Studies No results for input(s): "UHGB", "CRYS" in the last 72 hours.  Invalid input(s): "UACOL", "UAPR", "USPG", "UPH", "UTP", "UGL", "UKET", "UBIL", "UNIT", "UROB", "ULEU", "UEPI", "UWBC", "URBC", "UBAC", "CAST", "UCOM", "BILUA"  Basic Metabolic Panel: Recent Labs  Lab 10/21/22 0617 10/22/22 0533 10/23/22 0523 10/24/22 0613 10/26/22 0427  NA 133* 130* 134* 131* 128*  K 3.4* 4.1 4.0 4.5 4.6  CL 95* 93* 95* 93* 90*  CO2 27 28 29 27 28  $ GLUCOSE 138* 146* 150* 144* 140*  BUN 12 10 12 14 $ 22*  CREATININE 0.66 0.54 0.56 0.60 0.69  CALCIUM 8.2* 8.4* 8.8* 8.6* 8.4*  MG 1.9 2.0 2.0 1.8  --   PHOS  --  3.5  --   --   --    GFR Estimated Creatinine Clearance: 72.9 mL/min (by C-G formula based on SCr of 0.69 mg/dL). Liver Function Tests: Recent Labs  Lab 10/20/22 0836 10/21/22 0617 10/22/22 0533 10/23/22 0523 10/24/22 0613  AST 12* 12* 14* 14* 14*  ALT 13 13 16 18 16  $ ALKPHOS 66 58 71 78 82  BILITOT 0.5 0.5 0.3 0.2* 0.3  PROT 6.5 5.3* 6.0* 6.0* 5.7*  ALBUMIN 2.7* 2.2* 2.2* 2.2*  2.1*   No results for input(s): "LIPASE", "AMYLASE" in the last 168 hours. No results for input(s): "AMMONIA" in the last 168 hours. Coagulation profile Recent Labs  Lab 10/20/22 0836  INR 1.1    CBC: Recent Labs  Lab 10/22/22 0533 10/23/22 0523 10/24/22 0613 10/25/22 2038 10/26/22 0427  WBC 9.5 9.7 8.8 11.8* 10.7*  HGB 8.6* 8.7* 8.8* 9.2* 8.8*  HCT 27.9* 28.5* 30.9* 29.6* 28.3*  MCV 75.6* 76.2* 80.3 74.2* 74.1*  PLT 427* 462* 439* 558* 517*   Cardiac Enzymes: No results for input(s): "CKTOTAL", "CKMB", "CKMBINDEX", "TROPONINI" in the last 168 hours. BNP: Invalid input(s): "POCBNP" CBG: No results for input(s): "GLUCAP" in the last 168 hours. D-Dimer No results for input(s): "DDIMER" in the last 72 hours. Hgb A1c No results for input(s): "HGBA1C" in the last 72 hours. Lipid Profile No results for input(s): "CHOL", "HDL", "LDLCALC", "TRIG", "CHOLHDL", "LDLDIRECT" in the last 72 hours. Thyroid function studies No results for input(s): "TSH", "T4TOTAL", "T3FREE", "THYROIDAB" in the last 72 hours.  Invalid input(s): "FREET3" Anemia work up No results for input(s): "VITAMINB12", "FOLATE", "FERRITIN", "TIBC", "IRON", "RETICCTPCT" in the last 72 hours. Microbiology Recent Results (from the past 240 hour(s))  Fungus Culture With Stain  Status: None (Preliminary result)   Collection Time: 10/20/22  9:45 AM   Specimen: PATH Cytology Peritoneal fluid  Result Value Ref Range Status   Fungus Stain Final report  Final    Comment: (NOTE) Performed At: [facility] [id number] [place], [place] [id number] Rush Farmer MD [id number]    Fungus (Mycology) Culture PENDING  Incomplete   Fungal Source PERITONEAL  Final    Comment: Performed at [facility], [place] [address], [place], [place] [address]  Aerobic/Anaerobic Culture w Gram Stain (surgical/deep wound)     Status: None   Collection Time: 10/20/22  9:45 AM   Specimen: PATH Cytology  Peritoneal fluid  Result Value Ref Range Status   Specimen Description   Final    PERITONEAL Performed at [facility] [address] [place], [place] [address]    Special Requests   Final    NONE Performed at [facility], [place] [address] [place], [place] [address]    Gram Stain   Final    RARE WBC PRESENT, PREDOMINANTLY PMN NO ORGANISMS SEEN    Culture   Final    No growth aerobically or anaerobically. Performed at [facility], [place] [address], [place], [place] [address]    Report Status 10/25/2022 FINAL  Final  Fungus Culture Result     Status: None   Collection Time: 10/20/22  9:45 AM  Result Value Ref Range Status   Result 1 Comment  Final    Comment: (NOTE) KOH/Calcofluor preparation:  no fungus observed. Performed At: [facility] [place], [place] [id number] Rush Farmer MD [id number]       Studies:  IR IMAGING GUIDED PORT INSERTION  Result Date: 10/26/2022 CLINICAL DATA:  Peritoneal carcinomatosis and need for porta cath to begin chemotherapy. The patient is currently admitted to the hospital and will begin chemotherapy as an inpatient. EXAM: IMPLANTED PORT A CATH PLACEMENT WITH ULTRASOUND AND FLUOROSCOPIC GUIDANCE ANESTHESIA/SEDATION: Moderate (conscious) sedation was employed during this procedure. A total of Versed 2.0 mg and Fentanyl 100 mcg was administered intravenously by radiology nursing. Moderate Sedation Time: 34 minutes. The patient's level of consciousness and vital signs were monitored continuously by radiology nursing throughout the procedure under my direct supervision. FLUOROSCOPY: 2.0 mGy PROCEDURE: The procedure, risks, benefits, and alternatives were explained to the patient. Questions regarding the procedure were encouraged and answered. The patient understands and consents to the procedure. A time-out was performed prior to initiating the procedure. Ultrasound was utilized to  confirm patency of the right internal jugular vein. A permanent ultrasound image was saved and recorded. The right neck and chest were prepped with chlorhexidine in a sterile fashion, and a sterile drape was applied covering the operative field. Maximum barrier sterile technique with sterile gowns and gloves were used for the procedure. Local anesthesia was provided with 1% lidocaine. After creating a small venotomy incision, a 21 gauge needle was advanced into the right internal jugular vein under direct, real-time ultrasound guidance. Ultrasound image documentation was performed. After securing guidewire access, an 8 Fr dilator was placed. A J-wire was kinked to measure appropriate catheter length. A subcutaneous port pocket was then created along the upper chest wall utilizing sharp and blunt dissection. Portable cautery was utilized. The pocket was irrigated with sterile saline. A single lumen power injectable port was chosen for placement. The 8 Fr catheter was tunneled from the port pocket site to the venotomy incision. The port was placed  in the pocket. External catheter was trimmed to appropriate length based on guidewire measurement. At the venotomy, an 8 Fr peel-away sheath was placed over a guidewire. The catheter was then placed through the sheath and the sheath removed. Final catheter positioning was confirmed and documented with a fluoroscopic spot image. The port was accessed with a needle and aspirated and flushed with heparinized saline. The access needle was left in place. The venotomy and port pocket incisions were closed with subcutaneous 3-0 Monocryl and subcuticular 4-0 Vicryl. Dermabond was applied to both incisions. COMPLICATIONS: COMPLICATIONS None FINDINGS: After catheter placement, the tip lies at the cavo-atrial junction. The catheter aspirates normally and is ready for immediate use. IMPRESSION: Placement of single lumen port a cath via right internal jugular vein. The catheter tip lies  at the cavo-atrial junction. A power injectable port a cath was placed and is ready for immediate use. Electronically Signed   By: Aletta Edouard M.D.   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 Small amount gas in rectum. No bowel wall thickening or definite obstruction identified. Diffusely increased attenuation in peritoneal cavity consistent with ascites. No osseous abnormalities. IMPRESSION: Ascites. Nonobstructive bowel gas pattern. Electronically Signed   By: Lavonia Dana M.D.   On: 10/26/2022 10:53   CT CHEST W CONTRAST  Result Date: 10/25/2022 CLINICAL DATA:  Occult malignancy. EXAM: CT CHEST WITH CONTRAST TECHNIQUE: Multidetector CT imaging of the chest was performed during intravenous contrast administration. RADIATION DOSE REDUCTION: This exam was performed according to the departmental dose-optimization program which includes automated exposure control, adjustment of the mA and/or kV according to patient size and/or use of iterative reconstruction technique. CONTRAST:  40m OMNIPAQUE IOHEXOL 300 MG/ML  SOLN COMPARISON:  CT abdomen 10/19/2022 FINDINGS: Cardiovascular: Heart is normal size. Thoracic aorta is normal in caliber. Pulmonary arterial system is adequately opacified and demonstrates small embolus of the proximal right lower lobar pulmonary artery. No evidence of right heart strain. Remaining vascular structures are normal. Mediastinum/Nodes: No mediastinal or hilar adenopathy. Remaining mediastinal structures are normal. Lungs/Pleura: Lungs are adequately inflated. No airspace consolidation or effusion. Airways are normal. Upper Abdomen: Mild to moderate ascites over  the upper abdomen unchanged. Remainder of the upper abdomen is unchanged from the recent prior exam. Musculoskeletal: No focal abnormality. IMPRESSION: 1. Small embolus of the proximal right lower lobar pulmonary artery. No evidence of right heart strain. 2. Mild to moderate ascites over the upper abdomen unchanged from the recent prior exam. Critical Value/emergent results were called by telephone at the time of interpretation on 10/25/2022 at 7:10 p.m. to provider Nurse practitioner, ERufina Falco who verbally acknowledged these results. Electronically Signed   By: DMarin OlpM.D.   On: 10/25/2022 19:11    Assessment: 47y.o. 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 Malignant ascites status post paracentesis twice Recurrent N/V and constipation, secondary to #1, partial bowel obstruction due to the extensive peritoneal metastasis Moderate protein and calorie malnutrition Small PE    Plan:  -EGD was done this morning, which was negative for malignancy in the esophagus, stomach or duodenum.  However significant  extrinsic compression was noticed in the gastric antrum.  I spoke with Dr. Michail Sermon, he has talked to Dr. Paulita Fujita, who will be on inpt service next week, will consider EUS and biopsy next week. -Continue supportive care, I am glad palliative care Dr. Rowe Pavy was able to seen her, and manage her symptoms. -per her nurse, she has been able to keep some food and liquid down, will watch her over the weekend to see if she can eat more small meals with more aggressive antiemetics.  I agree with dietitian, if she is not able to eat enough to meet her nutritional needs, she will benefit from TPN. We can made decision early next week.  -I tentatively plan to start chemotherapy next week in the hospital. Depends on the plan of EUS.  -Her CT chest was negative for additional metastasis or  primary tumor, however did show a small PE, agree with anticoagulation with heparin. -I will f/u on Monday  -If she needs repeated paracentesis, please send order for [place] to cytology, for molecular testing.   Truitt Merle, MD 10/26/2022  6:14 PM

## 2022-10-26 NOTE — Anesthesia Procedure Notes (Signed)
Procedure Name: Intubation Date/Time: 10/26/2022 12:43 PM  Performed by: Maxwell Caul, CRNAPre-anesthesia Checklist: Patient identified, Emergency Drugs available, Suction available and Patient being monitored Patient Re-evaluated:Patient Re-evaluated prior to induction Oxygen Delivery Method: Circle system utilized Preoxygenation: Pre-oxygenation with 100% oxygen Induction Type: IV induction Ventilation: Mask ventilation without difficulty Laryngoscope Size: Mac and 4 Grade View: Grade I Tube type: Oral Tube size: 7.0 mm Number of attempts: 1 Airway Equipment and Method: Stylet and Oral airway Placement Confirmation: ETT inserted through vocal cords under direct vision, positive ETCO2 and breath sounds checked- equal and bilateral Secured at: 21 cm Tube secured with: Tape Dental Injury: Teeth and Oropharynx as per pre-operative assessment

## 2022-10-26 NOTE — Progress Notes (Signed)
Nutrition Follow-up  INTERVENTION:   -Patient with poor PO since admission 6 days ago, a lot of barriers to sufficient intake. If pt unable to eat enough to meet needs, recommend TPN.  -Recommend monitor magnesium, potassium, and phosphorus for at least 3 days, MD to replete as needed, as pt is at risk for refeeding syndrome. -Recommend 100 mg Thiamine daily for 5 days.  -Continue PO supplements as tolerated -Continue bowel regimen -Daily weights ordered  NUTRITION DIAGNOSIS:   Increased nutrient needs related to cancer and cancer related treatments as evidenced by estimated needs.  Ongoing.  GOAL:   Patient will meet greater than or equal to 90% of their needs  Not meeting.  MONITOR:   PO intake, Supplement acceptance, I & O's, Labs, Weight trends   ASSESSMENT:   47 y.o. female with medical history significant of anxiety, HPV comes to the hospital at [place] ED with complaints of abdominal distention and constipation. CT abdomen pelvis showed peritoneal carcinomatosis with extensive metastases, ascites and multiloculated cystic lesion of the pancreatic tail.  GYN oncology consulted, underwent paracentesis, 3.5 L was removed.  Patient currently NPO, awaiting EGD today.  Pt was having N/V yesterday. Very poor PO despite wanting to eat since admission. Pt with a lot of barriers to meeting nutritional needs (constipation, ascites, reflux).  Per oncology note, pt may need TPN and agree with recommendation.   Admission weight: 153 lbs Recommend daily weights   Medications: Colace, Ferrous sulfate, Lasix, Zofran, Multivitamin with minerals daily, Miralax, Senokot  Labs reviewed: Low Na -trending down   Diet Order:   Diet Order             Diet NPO time specified Except for: Sips with Meds  Diet effective midnight                   EDUCATION NEEDS:   No education needs have been identified at this time  Skin:  Skin Assessment: Reviewed RN Assessment  Last  BM:  2/7  Height:   Ht Readings from Last 1 Encounters:  10/19/22 4' 11"$  (1.499 m)    Weight:   Wt Readings from Last 1 Encounters:  10/19/22 69.4 kg    BMI:  Body mass index is 30.9 kg/m.  Estimated Nutritional Needs:   Kcal:  1500-1700  Protein:  75-90g  Fluid:  1.5L/day  Clayton Bibles, MS, RD, LDN Inpatient Clinical Dietitian Contact information available via Amion

## 2022-10-26 NOTE — Progress Notes (Signed)
Gynecologic Oncology Progress Note  Christina [name] is a 48 y.o. woman currently admitted with ascites, bilateral cystic and solid ovarian masses, and pancreatic lesion noted on recent CT imaging. 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 CA 125 at 314 CEA at 84 CA 19-9 at 25 She underwent a CT of the chest to evaluate for metastatic disease on 10/25/22 which revealed small embolus of the proximal right lower lobar pulmonary artery. No evidence of right heart strain. She was started on a heparin drip, this now being held for procedures today.  She is scheduled for an EGD this am with Dr. Michail Sermon. Port a cath placement also ordered as well.   Subjective: Patient reports throwing up three times yesterday. Nausea intermittently. Having significant reflux this am and requesting zofran for moderate nausea. She was given lasix and states she actually voided less after. Swelling noted in her hips improving. Continue to pass flatus. She saw her children yesterday afternoon. Her oldest two children, 8 and 40, are autistic. She talked about going home with TPN. She also discussed her mother dying of a blood clot after COVID.  Objective: Vital signs in last 24 hours: Temp:  [97.9 F (36.6 C)-98 F (36.7 C)] 97.9 F (36.6 C) (02/09 0544) Pulse Rate:  [93-102] 93 (02/09 0544) Resp:  [16-18] 16 (02/09 0544) BP: (146-149)/(96-99) 146/97 (02/09 0544) SpO2:  [95 %-99 %] 95 % (02/09 0544) Last BM Date : 10/24/22  Intake/Output from previous day: 02/08 0701 - 02/09 0700 In: 224.4 [P.O.:120; I.V.:104.4] Out: -   Physical Examination: General: alert, cooperative, and in mild distress related to nausea and feelings of imminent emesis. Resp: clear to auscultation bilaterally Cardio: HR in the 90s, regular rhythm GI: Abdomen distended, active bowel sounds. Extremities: extremities normal,  atraumatic, no cyanosis or edema. Improvement in edema around hips.     10/26/2022    5:44 AM 10/25/2022    7:48 PM 10/25/2022    2:39 PM  Vitals with BMI  Systolic [id number]  Diastolic 97 96 99  Pulse 93 102 101    CBC    Component Value Date/Time   WBC 10.7 (H) 10/26/2022 0427   RBC 3.82 (L) 10/26/2022 0427   HGB 8.8 (L) 10/26/2022 0427   HCT 28.3 (L) 10/26/2022 0427   PLT 517 (H) 10/26/2022 0427   MCV 74.1 (L) 10/26/2022 0427   MCH 23.0 (L) 10/26/2022 0427   MCHC 31.1 10/26/2022 0427   RDW 16.2 (H) 10/26/2022 0427   LYMPHSABS 1.9 04/11/2016 1310   MONOABS 0.3 04/11/2016 1310   EOSABS 0.2 04/11/2016 1310   BASOSABS 0.0 04/11/2016 1310   BMET    Component Value Date/Time   NA 128 (L) 10/26/2022 0427   K 4.6 10/26/2022 0427   CL 90 (L) 10/26/2022 0427   CO2 28 10/26/2022 0427   GLUCOSE 140 (H) 10/26/2022 0427   BUN 22 (H) 10/26/2022 0427   CREATININE 0.69 10/26/2022 0427   CALCIUM 8.4 (L) 10/26/2022 0427   GFRNONAA >60 10/26/2022 0427    Assessment: 47 y.o. currently admitted with ascites, bilateral cystic and solid ovarian masses, and pancreatic lesion noted on recent CT imaging. Cytology from recent paracentesis returning with adenocarcinoma, suggestive of upper GI or pancreatobiliary primary. 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   Pain:  Pain is well-controlled on PRN medications.  Heme: Hgb 8.8 and Hct 28.3 10/26/22 am. Stable compared with  previous values.  ID: WBC 10.7 this am from 8.8 on 10/24/22. No evidence of infection at this time. Afebrile.  CV: BP and HR overall stable, BP slightly elevated at times. Continue to monitor with ordered vital signs while inpatient.  GI:  Tolerating po: intermittent nausea, emesis on 2/8. Antiemetics ordered as needed.  GU: Creatinine 0.69 10/26/22 am. Pt reporting adequate output.    FEN: No critical values on am labs.  Prophylaxis: Lovenox ordered.  Plan: -Plans for EGD today, port a cath placement ordered  by Dr. Burr Medico. -RN notified to obtain antiemetic for patient -Continue with plan of care per Hospitalist Team, Dr. Burr Medico, GI, IR   LOS: 6 days    Dorothyann Gibbs 10/26/2022, 9:19 AM

## 2022-10-26 NOTE — Anesthesia Preprocedure Evaluation (Addendum)
Anesthesia Evaluation  Patient identified by MRN, date of birth, ID band Patient awake    Reviewed: Allergy & Precautions, NPO status , Patient's Chart, lab work & pertinent test results  History of Anesthesia Complications (+) PONV and history of anesthetic complications  Airway Mallampati: II  TM Distance: >3 FB Neck ROM: Full    Dental no notable dental hx. (+) Dental Advisory Given   Pulmonary Current Smoker and Patient abstained from smoking.   Pulmonary exam normal        Cardiovascular hypertension,  Rhythm:Regular Rate:Tachycardia     Neuro/Psych Seizures -, Well Controlled,  PSYCHIATRIC DISORDERS Anxiety Depression       GI/Hepatic negative GI ROS, Neg liver ROS,,,Metastatic adeno carcinoma   Endo/Other  diabetes    Renal/GU negative Renal ROS     Musculoskeletal negative musculoskeletal ROS (+)    Abdominal   Peds  Hematology negative hematology ROS (+)   Anesthesia Other Findings   Reproductive/Obstetrics                             Anesthesia Physical Anesthesia Plan  ASA: 4  Anesthesia Plan:    Post-op Pain Management: Minimal or no pain anticipated   Induction: Intravenous, Rapid sequence and Cricoid pressure planned  PONV Risk Score and Plan: 3 and Ondansetron, Dexamethasone and Midazolam  Airway Management Planned: Oral ETT  Additional Equipment:   Intra-op Plan:   Post-operative Plan: Extubation in OR  Informed Consent: I have reviewed the patients History and Physical, chart, labs and discussed the procedure including the risks, benefits and alternatives for the proposed anesthesia with the patient or authorized representative who has indicated his/her understanding and acceptance.     Dental advisory given  Plan Discussed with: Anesthesiologist and CRNA  Anesthesia Plan Comments: (Vomiting today, GA with RSI.)       Anesthesia Quick  Evaluation

## 2022-10-26 NOTE — Consult Note (Addendum)
Chief Complaint: Patient was seen in consultation today for peritoneal carcinomatosis at the request of Truitt Merle  Referring Physician(s): Truitt Merle  Supervising Physician: Aletta Edouard  Patient Status: [facility] - In-pt  History of Present Illness: Christina [name] is a 47 y.o. female with PMH of anxiety, tobacco abuse, HTN and seizures. She presented to [facility]  ED 10/19/22 c/o 6 month history of abdominal pain, approximately 1 month bloating, constipation and 30 lb weight loss over 3 months. Workup in the ED revealed hypokalemia, microcytic anemia. CT [facility] revealed peritoneal carcinomatosis with extensive metastasis, ascites and multiloculated cystic lesion of the pancreatic tail. She was transferred to [facility] for further evaluation. She underwent paracenteses 2/3 and 10/23/2022. 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 Pt consulted with oncology and has been referred to IR for tunneled catheter with port placement to begin treatment.   Pt denies fevers, chills, CP or weakness.  She endorses SOB, abd pain, abd distention, N/V, edema to bilateral upper thighs, fatigue, loss of appetite and weight loss.  She is NPO per order.  Pt expresses concern with sedation medication stating " I go down and they have to bring me back."   Past Medical History:  Diagnosis Date   Anxiety    Depression    Gestational diabetes mellitus, antepartum 2015   managed with diet   Hypertension    on meds until wt loss   PONV (postoperative nausea and vomiting)    Seizures ([place]) 2001   stressed induced; 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    Past Surgical History:  Procedure Laterality Date   APPENDECTOMY     CESAREAN SECTION     CESAREAN SECTION N/A 07/14/2014   Procedure: CESAREAN SECTION;  Surgeon: Cheri Fowler, MD;  Location: [facility];  Service: Obstetrics;  Laterality: N/A;   CESAREAN SECTION N/A 05/23/2015   Procedure: CESAREAN SECTION;  Surgeon:  Cheri Fowler, MD;  Location: [facility];  Service: Obstetrics;  Laterality: N/A;   LAPAROSCOPIC BILATERAL SALPINGECTOMY Bilateral 06/12/2016   Procedure: LAPAROSCOPIC BILATERAL SALPINGECTOMY;  Surgeon: Jonnie Kind, MD;  Location: [facility];  Service: Gynecology;  Laterality: Bilateral;    Allergies: Claritin [loratadine], Diphenhydramine, and Lorazepam  Medications: Prior to Admission medications   Medication Sig Start Date End Date Taking? Authorizing Provider  acetaminophen (TYLENOL) 500 MG tablet Take 500-1,000 mg by mouth 3 (three) times daily as needed for mild pain or moderate pain.   Yes [provider]  dicyclomine (BENTYL) 20 MG tablet Take 20 mg by mouth as needed for spasms. 07/18/22  Yes [provider]  Menthol, Topical Analgesic, (ICY HOT EX) Apply 1 application  topically as needed (pain).   Yes [provider]  OVER THE COUNTER MEDICATION Take 2 Bars by mouth as needed (constipation). Exlax Bars   Yes [provider]  polyethylene glycol (MIRALAX / GLYCOLAX) 17 g packet Take 17 g by mouth daily as needed for moderate constipation or severe constipation.   Yes [provider]  Simethicone (GAS-X PO) Take 2 tablets by mouth as needed (bloat).   Yes [provider]  Sodium Phosphates (FLEET ENEMA RE) Place 1 Dose rectally as needed (consttipation).   Yes [provider]     Family History  Problem Relation Age of Onset   Cancer Father    Hypertension Mother    Diabetes Mother    Autism Son     Social History   Socioeconomic History  Marital status: Legally Separated    Spouse name: Not on file   Number of children: Not on file   Years of education: Not on file   Highest education level: Not on file  Occupational History   Not on file  Tobacco Use   Smoking status: Every Day    Packs/day: 1.00    Years: 12.00    Total pack years: 12.00    Types: Cigarettes   Smokeless tobacco: Never  Substance and  Sexual Activity   Alcohol use: Yes    Comment: rarely   Drug use: No    Comment: not during pregnancy   Sexual activity: Not Currently    Partners: Male    Birth control/protection: None  Other Topics Concern   Not on file  Social History Narrative   Not on file   Social Determinants of Health   Financial Resource Strain: Not on file  Food Insecurity: No Food Insecurity (10/20/2022)   Hunger Vital Sign    Worried About Running Out of Food in the Last Year: Never true    Ran Out of Food in the Last Year: Never true  Transportation Needs: No Transportation Needs (10/20/2022)   PRAPARE - Hydrologist (Medical): No    Lack of Transportation (Non-Medical): No  Physical Activity: Not on file  Stress: Not on file  Social Connections: Not on file   Review of Systems: A 12 point ROS discussed and pertinent positives are indicated in the HPI above.  All other systems are negative.  Review of Systems  Constitutional:  Positive for appetite change, fatigue and unexpected weight change. Negative for chills and fever.  Respiratory:  Positive for shortness of breath.   Cardiovascular:  Positive for leg swelling. Negative for chest pain.  Gastrointestinal:  Positive for abdominal distention, abdominal pain, constipation, nausea and vomiting.  Neurological:  Negative for weakness.    Vital Signs: BP (!) 146/97 (BP Location: Left Arm)   Pulse 93   Temp 97.9 F (36.6 C) (Oral)   Resp 16   Ht 4' 11"$  (1.499 m)   Wt 153 lb (69.4 kg)   SpO2 95%   BMI 30.90 kg/m     Physical Exam Vitals reviewed.  Constitutional:      General: She is not in acute distress.    Appearance: Normal appearance. She is not ill-appearing.  HENT:     Head: Normocephalic and atraumatic.     Mouth/Throat:     Mouth: Mucous membranes are dry.     Pharynx: Oropharynx is clear.  Eyes:     Extraocular Movements: Extraocular movements intact.     Pupils: Pupils are equal, round, and  reactive to light.  Cardiovascular:     Rate and Rhythm: Regular rhythm. Tachycardia present.     Pulses: Normal pulses.     Heart sounds: Normal heart sounds. No murmur heard. Pulmonary:     Effort: Pulmonary effort is normal. No respiratory distress.     Breath sounds: Normal breath sounds.  Abdominal:     General: Bowel sounds are normal. There is distension.     Palpations: Abdomen is soft.     Tenderness: There is abdominal tenderness. There is no guarding.  Musculoskeletal:     Right lower leg: No edema.     Left lower leg: No edema.  Skin:    General: Skin is warm and dry.  Neurological:     Mental Status: She is alert and oriented  to person, place, and time.  Psychiatric:        Mood and Affect: Mood normal.        Behavior: Behavior normal.        Thought Content: Thought content normal.        Judgment: Judgment normal.     Imaging: CT CHEST W CONTRAST  Result Date: 10/25/2022 CLINICAL DATA:  Occult malignancy. EXAM: CT CHEST WITH CONTRAST TECHNIQUE: Multidetector CT imaging of the chest was performed during intravenous contrast administration. RADIATION DOSE REDUCTION: This exam was performed according to the departmental dose-optimization program which includes automated exposure control, adjustment of the mA and/or kV according to patient size and/or use of iterative reconstruction technique. CONTRAST:  30m OMNIPAQUE IOHEXOL 300 MG/ML  SOLN COMPARISON:  CT abdomen 10/19/2022 FINDINGS: Cardiovascular: Heart is normal size. Thoracic aorta is normal in caliber. Pulmonary arterial system is adequately opacified and demonstrates small embolus of the proximal right lower lobar pulmonary artery. No evidence of right heart strain. Remaining vascular structures are normal. Mediastinum/Nodes: No mediastinal or hilar adenopathy. Remaining mediastinal structures are normal. Lungs/Pleura: Lungs are adequately inflated. No airspace consolidation or effusion. Airways are normal. Upper  Abdomen: Mild to moderate ascites over the upper abdomen unchanged. Remainder of the upper abdomen is unchanged from the recent prior exam. Musculoskeletal: No focal abnormality. IMPRESSION: 1. Small embolus of the proximal right lower lobar pulmonary artery. No evidence of right heart strain. 2. Mild to moderate ascites over the upper abdomen unchanged from the recent prior exam. Critical Value/emergent results were called by telephone at the time of interpretation on 10/25/2022 at 7:10 p.m. to provider Nurse practitioner, ERufina Falco who verbally acknowledged these results. Electronically Signed   By: DMarin OlpM.D.   On: 10/25/2022 19:11   UKorea[place]acentesis  Result Date: 10/23/2022 INDICATION: Patient with history of abdominal pain, peritoneal carcinomatosis, cystic and solid masses of bilateral ovaries, cystic lesion pancreatic tail, recurrent ascites. Request received for therapeutic paracentesis. EXAM: ULTRASOUND GUIDED THERAPEUTIC PARACENTESIS MEDICATIONS: 8 mL 1% lidocaine COMPLICATIONS: None immediate. PROCEDURE: Informed written consent was obtained from the patient after a discussion of the risks, benefits and alternatives to treatment. A timeout was performed prior to the initiation of the procedure. Initial ultrasound scanning demonstrates a moderate amount of ascites within the left lower abdominal quadrant. The left lower abdomen was prepped and draped in the usual sterile fashion. 1% lidocaine was used for local anesthesia. Following this, a 19 gauge, 7-cm, Yueh catheter was introduced. An ultrasound image was saved for documentation purposes. The paracentesis was performed. The catheter was removed and a dressing was applied. The patient tolerated the procedure well without immediate post procedural complication. FINDINGS: A total of approximately 3.1 liters of yellow fluid was removed. IMPRESSION: Successful ultrasound-guided therapeutic paracentesis yielding 3.1 liters of peritoneal fluid.  Read by: KRowe Robert PA-C Electronically Signed   By: AMarkus DaftM.D.   On: 10/23/2022 16:47   [place]oreaParacentesis  Result Date: 10/20/2022 INDICATION: Peritoneal carcinomatosis with ascites EXAM: ULTRASOUND GUIDED DIAGNOSTIC AND THERAPEUTIC PARACENTESIS MEDICATIONS: None. COMPLICATIONS: None immediate. PROCEDURE: Informed written consent was obtained from the patient after a discussion of the risks, benefits and alternatives to treatment. A timeout was performed prior to the initiation of the procedure. Initial ultrasound scanning demonstrates a moderate amount of ascites within the left upper abdominal quadrant. The left upper abdomen was prepped and draped in the usual sterile fashion. 1% lidocaine was used for local anesthesia. Following this, a 19 gauge, 7-cm, YTeressa Lower  catheter was introduced under direct US guidance. An ultrasound image was saved for documentation purposes. The paracentesis was performed. The catheter was removed and a dressing was applied. The patient tolerated the procedure well without immediate post procedural complication. FINDINGS: A total of approximately 3.5L of ascitic fluid was removed. Samples were sent to the laboratory as requested by the clinical team. IMPRESSION: Successful ultrasound-guided diagnostic and therapeutic paracentesis yielding 3.5 L of peritoneal fluid. Read by Pasty Spillers, IR PA Electronically Signed   By: Michaelle Birks M.D.   On: 10/20/2022 11:17   CT ABDOMEN PELVIS W CONTRAST  Result Date: 10/19/2022 CLINICAL DATA:  Abdominal pain; * Tracking Code: BO * EXAM: CT ABDOMEN AND PELVIS WITH CONTRAST TECHNIQUE: Multidetector CT imaging of the abdomen and pelvis was performed using the standard protocol following bolus administration of intravenous contrast. RADIATION DOSE REDUCTION: This exam was performed according to the departmental dose-optimization program which includes automated exposure control, adjustment of the mA and/or kV according to patient size and/or  use of iterative reconstruction technique. CONTRAST:  58m OMNIPAQUE IOHEXOL 300 MG/ML  SOLN COMPARISON:  CT abdomen and pelvis dated October 21, 2009 FINDINGS: Lower chest: No acute abnormality. Hepatobiliary: No focal liver abnormality is seen. No gallstones, gallbladder wall thickening, or biliary dilatation. Pancreas: Multiloculated cystic lesion of the pancreatic tail measuring 7.5 x 4.1 cm on series 2, image 24. No main pancreatic duct dilation. Spleen: Normal in size without focal abnormality. Adrenals/Urinary Tract: Bilateral adrenal glands are unremarkable. No hydronephrosis. Nonobstructing stones of the lower pole of the left kidney. No suspicious renal lesions. Bladder is unremarkable. Stomach/Bowel: Stomach is within normal limits. No evidence of bowel wall thickening, distention, or inflammatory changes. Vascular/Lymphatic: Mild aortic atherosclerosis. No enlarged abdominal or pelvic lymph nodes. Reproductive: Left-greater-than-right multiloculated cystic and solid masses of the bilateral ovaries measuring 8.2 x 8.3 x 10.1 cm on the left and 9.1 x 6.8 by 9.0 cm on the right. Other: Large volume abdominal ascites, peritoneal implants and omental caking. Reference peritoneal implant of the left paracolic gutter measuring 2.6 x 1.7 cm on series 2, image 29. Reference serosal implant located posterior to the wall of the stomach measuring 1.5 x 1.7 cm on image 30. Musculoskeletal: Tiny sclerotic lesions of the left sacrum are unchanged when compared with prior exam and likely due to bone islands. No aggressive appearing osseous lesions IMPRESSION: 1. Large volume abdominal ascites, peritoneal implants and omental caking, compatible with peritoneal carcinomatosis. 2. Left-greater-than-right multiloculated cystic and solid masses of the bilateral ovaries, differential considerations include primary ovarian malignancy or metastatic tumor to the ovaries. 3. Multiloculated cystic lesion of the pancreatic tail,  concerning for primary pancreatic neoplasm versus metastatic disease. 4. Nonobstructing left renal stone. 5.  Aortic Atherosclerosis (ICD10-I70.0). Electronically Signed   By: LYetta GlassmanM.D.   On: 10/19/2022 15:49    Labs:  CBC: Recent Labs    10/23/22 0523 10/24/22 0613 10/25/22 2038 10/26/22 0427  WBC 9.7 8.8 11.8* 10.7*  HGB 8.7* 8.8* 9.2* 8.8*  HCT 28.5* 30.9* 29.6* 28.3*  PLT 462* 439* 558* 517*    COAGS: Recent Labs    10/20/22 0836  INR 1.1    BMP: Recent Labs    10/22/22 0533 10/23/22 0523 10/24/22 0613 10/26/22 0427  NA 130* 134* 131* 128*  K 4.1 4.0 4.5 4.6  CL 93* 95* 93* 90*  CO2 28 29 27 28  $ GLUCOSE 146* 150* 144* 140*  BUN 10 12 14 $ 22*  CALCIUM 8.4* 8.8* 8.6*  8.4*  CREATININE 0.54 0.56 0.60 0.69  GFRNONAA >60 >60 >60 >60    LIVER FUNCTION TESTS: Recent Labs    10/21/22 0617 10/22/22 0533 10/23/22 0523 10/24/22 0613  BILITOT 0.5 0.3 0.2* 0.3  AST 12* 14* 14* 14*  ALT 13 16 18 16  $ ALKPHOS 58 71 78 82  PROT 5.3* 6.0* 6.0* 5.7*  ALBUMIN 2.2* 2.2* 2.2* 2.1*    TUMOR MARKERS: No results for input(s): "AFPTM", "CEA", "CA199", "CHROMGRNA" in the last 8760 hours.  Assessment and Plan:  46 yo female with recently diagnosed peritoneal carcinomatosis presents to IR for tunneled catheter with port placement.   Pt noted to be resting in bed with sister at bedside.  She is A&O, calm and pleasant.  She is in no distress.  Pt is scheduled for 1300 endoscopy today.  Heparin gtt discontinued at 0600.   Risks and benefits of image guided tunneled catheter with port placement with moderate sedation was discussed with the patient including, but not limited to bleeding, infection, pneumothorax, or fibrin sheath development and need for additional procedures.  All of the patient's questions were answered, patient is agreeable to proceed. Consent signed and in chart.  Thank you for this interesting consult.  I greatly enjoyed meeting Christina [name] and look forward to participating in their care.  A copy of this report was sent to the requesting provider on this date.  Electronically Signed: Tyson Alias, NP 10/26/2022, 8:24 AM   I spent a total of 20 minutes in face to face in clinical consultation, greater than 50% of which was counseling/coordinating care for peritoneal carcinomatosis.

## 2022-10-26 NOTE — Consult Note (Signed)
Consultation Note Date: 10/26/2022   Patient Name: Christina [name]  DOB: 09-19-1975  MRN: [id number]  Age / Sex: 47 y.o., female  PCP: [name], MD Referring Physician: Shelly Coss, MD  Reason for Consultation: Establishing goals of care, Non pain symptom management, Pain control, and Psychosocial/spiritual support  HPI/Patient Profile: 47 y.o. female  admitted on 10/19/2022 .   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 Medical oncology, GYN oncology following GI also consulted patient to undergo EGD Palliative care consulted for symptom management, offering additional support and to continue broad goals of care discussions. Patient is awake alert resting in bed.  Sister present at bedside. Palliative medicine is specialized medical care for people living with serious illness. It focuses on providing relief from the symptoms and stress of a serious illness. The goal is to improve quality of life for both the patient and the family. Goals of care: Broad aims of medical therapy in relation to the patient's values and preferences. Our aim is to provide medical care aimed at enabling patients to achieve the goals that matter most to them, given the circumstances of their particular medical situation and their constraints.  We discussed about pain and on pain symptom management.  Offered time and reflection.  Supportive empathic presence and other therapeutic techniques employed at the time of this initial encounter, discussed about management of nausea and vomiting.  NEXT OF KIN Has sisters, has young children.  SUMMARY OF RECOMMENDATIONS   Full Code/Full Scope care Scheduled Zofran as time trial Continue current pain and non pain  symptom management.  Patient to undergo tunneled catheter port placement and EGD today.  PMT to continue to follow along.  Thank you for the consult.   Code Status/Advance Care Planning: Full code   Symptom Management:     Palliative Prophylaxis:  Frequent Pain Assessment  Additional Recommendations (Limitations, Scope, Preferences): Full Scope Treatment  Psycho-social/Spiritual:  Desire for further Chaplaincy support:yes Additional Recommendations: Caregiving  Support/Resources  Prognosis:  Unable to determine  Discharge Planning: To Be Determined      Primary Diagnoses: Present on Admission:  Peritoneal carcinomatosis ([place])   I have reviewed the medical record, interviewed the patient and family, and examined the patient. The following aspects are pertinent.  Past Medical History:  Diagnosis Date   Anxiety    Depression    Gestational diabetes mellitus, antepartum 2015   managed with diet   Hypertension    on meds until wt loss   PONV (postoperative nausea and vomiting)    Seizures ([place]) 2001   stressed induced; 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   Social History   Socioeconomic History   Marital status: Legally Separated    Spouse name: Not on file   Number of children: Not on file   Years of education: Not on file   Highest education level: Not on file  Occupational History  Not on file  Tobacco Use   Smoking status: Every Day    Packs/day: 1.00    Years: 12.00    Total pack years: 12.00    Types: Cigarettes   Smokeless tobacco: Never  Substance and Sexual Activity   Alcohol use: Yes    Comment: rarely   Drug use: No    Comment: not during pregnancy   Sexual activity: Not Currently    Partners: Male    Birth control/protection: None  Other Topics Concern   Not on file  Social History Narrative   Not on file   Social Determinants of Health   Financial Resource Strain: Not on file  Food Insecurity: No Food Insecurity (10/20/2022)    Hunger Vital Sign    Worried About Running Out of Food in the Last Year: Never true    Ran Out of Food in the Last Year: Never true  Transportation Needs: No Transportation Needs (10/20/2022)   PRAPARE - Hydrologist (Medical): No    Lack of Transportation (Non-Medical): No  Physical Activity: Not on file  Stress: Not on file  Social Connections: Not on file   Family History  Problem Relation Age of Onset   Cancer Father    Hypertension Mother    Diabetes Mother    Autism Son    Scheduled Meds:  [MAR Hold] (feeding supplement) PROSource Plus  30 mL Oral BID BM   [MAR Hold] amLODipine  10 mg Oral Daily   docusate sodium  100 mg Oral BID   [MAR Hold] feeding supplement  1 Container Oral TID BM   [MAR Hold] ferrous sulfate  325 mg Oral Q breakfast   [MAR Hold] folic acid  1 mg Oral Daily   [MAR Hold] furosemide  40 mg Oral Daily   [MAR Hold] mineral oil  1 enema Rectal Once   [MAR Hold] multivitamin with minerals  1 tablet Oral Daily   [MAR Hold] ondansetron (ZOFRAN) IV  4 mg Intravenous Q8H   [MAR Hold] polyethylene glycol  17 g Oral BID   [MAR Hold] senna-docusate  2 tablet Oral QHS   [MAR Hold] spironolactone  25 mg Oral Daily   Continuous Infusions: PRN Meds:.[MAR Hold] acetaminophen, [MAR Hold] alum & mag hydroxide-simeth, [MAR Hold] diphenhydrAMINE-zinc acetate, [MAR Hold] guaiFENesin, [MAR Hold] hydrALAZINE, [MAR Hold] HYDROcodone-acetaminophen, [MAR Hold]  HYDROmorphone (DILAUDID) injection, [MAR Hold] ipratropium-albuterol, [MAR Hold] metoprolol tartrate, [MAR Hold] naLOXone (NARCAN)  injection, [MAR Hold] ondansetron (ZOFRAN) IV, [MAR Hold] traZODone Medications Prior to Admission:  Prior to Admission medications   Medication Sig Start Date End Date Taking? Authorizing Provider  acetaminophen (TYLENOL) 500 MG tablet Take 500-1,000 mg by mouth 3 (three) times daily as needed for mild pain or moderate pain.   Yes [provider]   dicyclomine (BENTYL) 20 MG tablet Take 20 mg by mouth as needed for spasms. 07/18/22  Yes [provider]  Menthol, Topical Analgesic, (ICY HOT EX) Apply 1 application  topically as needed (pain).   Yes [provider]  OVER THE COUNTER MEDICATION Take 2 Bars by mouth as needed (constipation). Exlax Bars   Yes [provider]  polyethylene glycol (MIRALAX / GLYCOLAX) 17 g packet Take 17 g by mouth daily as needed for moderate constipation or severe constipation.   Yes [provider]  Simethicone (GAS-X PO) Take 2 tablets by mouth as needed (bloat).   Yes [provider]  Sodium Phosphates (FLEET ENEMA RE) Place 1  Dose rectally as needed (consttipation).   Yes [provider]   Allergies  Allergen Reactions   Claritin [Loratadine] Other (See Comments)    causes spasms   Diphenhydramine Other (See Comments)    Causes spasms and keeps alert for days   Lorazepam Other (See Comments)    After a few doses it made her feel paranoid   Review of Systems Complains of nausea Physical Exam Awake alert Resting in bed Abdomen mildly distended Case edema Awake alert oriented Regular work of breathing  Vital Signs: BP (!) 147/99   Pulse (!) 101   Temp 97.7 F (36.5 C) (Tympanic)   Resp 20   Ht 4' 11"$  (1.499 m)   Wt 69.4 kg   LMP 10/12/2022   SpO2 95%   BMI 30.90 kg/m  Pain Scale: 0-10   Pain Score: 0-No pain   SpO2: SpO2: 95 % O2 Device:SpO2: 95 % O2 Flow Rate: .   IO: Intake/output summary:  Intake/Output Summary (Last 24 hours) at 10/26/2022 1151 Last data filed at 10/26/2022 [id number] Gross per 24 hour  Intake 104.35 ml  Output --  Net 104.35 ml    LBM: Last BM Date : 10/24/22 Baseline Weight: Weight: 69.4 kg Most recent weight: Weight: 69.4 kg     Palliative Assessment/Data:   PPS 60%  Time In:  14.30 Time Out:  15.30 Time Total:  60 Greater than 50%  of this time was spent counseling and coordinating care related to  the above assessment and plan.  Signed by: Loistine Chance, MD   Please contact Palliative Medicine Team phone at [phone] for questions and concerns.  For individual provider: See Shea Evans

## 2022-10-26 NOTE — Transfer of Care (Signed)
Immediate Anesthesia Transfer of Care Note  Patient: Christina [name]  Procedure(s) Performed: ESOPHAGOGASTRODUODENOSCOPY (EGD) BIOPSY  Patient Location: Endoscopy Unit  Anesthesia Type:General  Level of Consciousness: awake, alert , and oriented  Airway & Oxygen Therapy: Patient Spontanous Breathing and Patient connected to face mask oxygen  Post-op Assessment: Report given to RN and Post -op Vital signs reviewed and stable  Post vital signs: Reviewed and stable  Last Vitals:  Vitals Value Taken Time  BP 168/108 10/26/22 1303  Temp    Pulse 98 10/26/22 1304  Resp 22 10/26/22 1304  SpO2 100 % 10/26/22 1304  Vitals shown include unvalidated device data.  Last Pain:  Vitals:   10/26/22 1135  TempSrc: Tympanic  PainSc: 0-No pain      Patients Stated Pain Goal: 3 ([id number])  Complications: No notable events documented.

## 2022-10-26 NOTE — Interval H&P Note (Signed)
History and Physical Interval Note:  10/26/2022 12:35 PM  Christina [name]  has presented today for surgery, with the diagnosis of abdominal pain, metastic cancer.  The various methods of treatment have been discussed with the patient and family. After consideration of risks, benefits and other options for treatment, the patient has consented to  Procedure(s): ESOPHAGOGASTRODUODENOSCOPY (EGD) (N/A) as a surgical intervention.  The patient's history has been reviewed, patient examined, no change in status, stable for surgery.  I have reviewed the patient's chart and labs.  Questions were answered to the patient's satisfaction.     [name]

## 2022-10-26 NOTE — Procedures (Signed)
Interventional Radiology Procedure Note  Procedure: Single Lumen Power Port Placement    Access:  Right IJ vein.  Findings: Catheter tip positioned at SVC/RA junction. Port is ready for immediate use.   Complications: None  EBL: < 10 mL  Recommendations:  - Ok to shower in 24 hours - Do not submerge for 7 days - Routine line care   Tansy Lorek T. Kamala Kolton, M.D Pager:  [phone]   

## 2022-10-26 NOTE — Progress Notes (Signed)
[place] for IV heparin Indication: pulmonary embolus  Allergies  Allergen Reactions   Claritin [Loratadine] Other (See Comments)    causes spasms   Diphenhydramine Other (See Comments)    Causes spasms and keeps alert for days   Lorazepam Other (See Comments)    After a few doses it made her feel paranoid    Patient Measurements: Height: 4' 11"$  (149.9 cm) Weight: 69.4 kg (153 lb) IBW/kg (Calculated) : 43.2 Heparin Dosing Weight: 59 kg  Vital Signs: Temp: 98.1 F (36.7 C) (02/09 1351) Temp Source: Oral (02/09 1351) BP: 170/101 (02/09 1625) Pulse Rate: 93 (02/09 1625)  Labs: Recent Labs    10/24/22 0613 10/25/22 2038 10/26/22 0427  HGB 8.8* 9.2* 8.8*  HCT 30.9* 29.6* 28.3*  PLT 439* 558* 517*  HEPARINUNFRC  --   --  0.13*  CREATININE 0.60  --  0.69     Estimated Creatinine Clearance: 72.9 mL/min (by C-G formula based on SCr of 0.69 mg/dL).   Medical History: Past Medical History:  Diagnosis Date   Anxiety    Depression    Gestational diabetes mellitus, antepartum 2015   managed with diet   Hypertension    on meds until wt loss   PONV (postoperative nausea and vomiting)    Seizures ([place]) 2001   stressed induced; 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    Assessment: 59 yoF admitted on 2/2 with new finding of metastatic peritoneal carcinomatosis/ascites. CT chest on 2/8 showed incidental finding of small PE. Pharmacy is consulted to dose IV heparin.  No prior to admission anticoagulation Lovenox 82m SQ daily ordered for inpatient VTE prophylaxis (last dose on 2/8 at 1203) Baseline PT/INR WNL on 2/3  Today, 10/26/2022: IV heparin held at 0700 for EGD Also underwent port placement today  CBC: Hgb low/stable at 8.8, Plt elevated  Goal of Therapy:  Heparin level 0.3-0.7 units/ml Monitor platelets by anticoagulation protocol: Yes   Plan:  Per SNarda Rutherford NP with Interventional Radiology, okay to resume heparin  at 2100 tonight if no bleeding Will resume IV heparin at previous rate of 1250 units/hr once confirm no bleeding issues Heparin level 6 hours after resumption Daily CBC, heparin level Monitor closely for s/sx of bleeding      JLindell Spar PharmD, BCPS Clinical Pharmacist  10/26/2022, 6:40 PM

## 2022-10-26 NOTE — Anesthesia Postprocedure Evaluation (Signed)
Anesthesia Post Note  Patient: Christina [name]  Procedure(s) Performed: ESOPHAGOGASTRODUODENOSCOPY (EGD) BIOPSY     Patient location during evaluation: Endoscopy Anesthesia Type: General Level of consciousness: sedated Pain management: pain level controlled Vital Signs Assessment: post-procedure vital signs reviewed and stable Respiratory status: spontaneous breathing and respiratory function stable Cardiovascular status: stable Postop Assessment: no apparent nausea or vomiting Anesthetic complications: no   No notable events documented.  Last Vitals:  Vitals:   10/26/22 1325 10/26/22 1330  BP: (!) 153/107 (!) 170/102  Pulse: (!) 109 100  Resp: 13 19  Temp:    SpO2: 98% 92%    Last Pain:  Vitals:   10/26/22 1135  TempSrc: Tympanic  PainSc: 0-No pain                 Keawe Marcello DANIEL

## 2022-10-27 ENCOUNTER — Inpatient Hospital Stay (HOSPITAL_COMMUNITY): Payer: Medicaid Other

## 2022-10-27 DIAGNOSIS — C786 Secondary malignant neoplasm of retroperitoneum and peritoneum: Secondary | ICD-10-CM | POA: Diagnosis not present

## 2022-10-27 LAB — BASIC METABOLIC PANEL
Anion gap: 10 (ref 5–15)
BUN: 20 mg/dL (ref 6–20)
CO2: 27 mmol/L (ref 22–32)
Calcium: 8.3 mg/dL — ABNORMAL LOW (ref 8.9–10.3)
Chloride: 92 mmol/L — ABNORMAL LOW (ref 98–111)
Creatinine, Ser: 0.64 mg/dL (ref 0.44–1.00)
GFR, Estimated: >60 mL/min (ref >60)
Glucose, Bld: 135 mg/dL — ABNORMAL HIGH (ref 70–99)
Potassium: 4 mmol/L (ref 3.5–5.1)
Sodium: 129 mmol/L — ABNORMAL LOW (ref 135–145)

## 2022-10-27 LAB — CBC
HCT: 25.7 % — ABNORMAL LOW (ref 36.0–46.0)
Hemoglobin: 7.9 g/dL — ABNORMAL LOW (ref 12.0–15.0)
MCH: 22.8 pg — ABNORMAL LOW (ref 26.0–34.0)
MCHC: 30.7 g/dL (ref 30.0–36.0)
MCV: 74.3 fL — ABNORMAL LOW (ref 80.0–100.0)
Platelets: 416 10*3/uL — ABNORMAL HIGH (ref 150–400)
RBC: 3.46 MIL/uL — ABNORMAL LOW (ref 3.87–5.11)
RDW: 16.2 % — ABNORMAL HIGH (ref 11.5–15.5)
WBC: 7.6 10*3/uL (ref 4.0–10.5)
nRBC: 0 % (ref 0.0–0.2)

## 2022-10-27 LAB — HEPARIN LEVEL (UNFRACTIONATED)
Heparin Unfractionated: 0.13 IU/mL — ABNORMAL LOW (ref 0.30–0.70)
Heparin Unfractionated: 0.33 IU/mL (ref 0.30–0.70)
Heparin Unfractionated: 0.16 IU/mL — ABNORMAL LOW (ref 0.30–0.70)

## 2022-10-27 MED ORDER — CHLORHEXIDINE GLUCONATE CLOTH 2 % EX PADS
6.0000 | MEDICATED_PAD | Freq: Every day | CUTANEOUS | Status: DC
  Administered 2022-10-27 – 2022-11-17 (×20): 6 via TOPICAL

## 2022-10-27 MED ORDER — HEPARIN BOLUS VIA INFUSION
2000.0000 [IU] | Freq: Once | INTRAVENOUS | Status: AC
  Administered 2022-10-27: 2000 [IU] via INTRAVENOUS
  Filled 2022-10-27: qty 2000

## 2022-10-27 MED ORDER — LOSARTAN POTASSIUM 50 MG PO TABS
50.0000 mg | ORAL_TABLET | Freq: Every day | ORAL | Status: DC
  Administered 2022-10-27 – 2022-11-17 (×18): 50 mg via ORAL
  Filled 2022-10-27 (×20): qty 1

## 2022-10-27 MED ORDER — LIDOCAINE HCL 1 % IJ SOLN
INTRAMUSCULAR | Status: AC
  Administered 2022-10-27: 10 mL
  Filled 2022-10-27: qty 20

## 2022-10-27 NOTE — Progress Notes (Signed)
[place] for IV heparin Indication: pulmonary embolus  Allergies  Allergen Reactions   Claritin [Loratadine] Other (See Comments)    causes spasms   Diphenhydramine Other (See Comments)    Causes spasms and keeps alert for days   Lorazepam Other (See Comments)    After a few doses it made her feel paranoid    Patient Measurements: Height: 4' 11"$  (149.9 cm) Weight: 69.4 kg (153 lb) IBW/kg (Calculated) : 43.2 Heparin Dosing Weight: 59 kg  Vital Signs: Temp: 98.2 F (36.8 C) (02/10 0432) Temp Source: Oral (02/10 0432) BP: 157/95 (02/10 0437) Pulse Rate: 94 (02/10 0437)  Labs: Recent Labs    10/24/22 0613 10/25/22 2038 10/26/22 0427 10/27/22 0403  HGB 8.8* 9.2* 8.8* 7.9*  HCT 30.9* 29.6* 28.3* 25.7*  PLT 439* 558* 517* 416*  HEPARINUNFRC  --   --  0.13* 0.13*  CREATININE 0.60  --  0.69 0.64     Estimated Creatinine Clearance: 72.9 mL/min (by C-G formula based on SCr of 0.64 mg/dL).   Medical History: Past Medical History:  Diagnosis Date   Anxiety    Depression    Gestational diabetes mellitus, antepartum 2015   managed with diet   Hypertension    on meds until wt loss   PONV (postoperative nausea and vomiting)    Seizures ([place]) 2001   stressed induced; 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    Assessment: 53 yoF admitted on 2/2 with new finding of metastatic peritoneal carcinomatosis/ascites. CT chest on 2/8 showed incidental finding of small PE. Pharmacy is consulted to dose IV heparin.  No prior to admission anticoagulation Lovenox 58m SQ daily ordered for inpatient VTE prophylaxis (last dose on 2/8 at 1203) Baseline PT/INR WNL on 2/3  Today, 10/27/2022: HL 0.13 subtherapeutic on 1250 units/hr Hgb 7.9, plts high Per RN no bleeding noted   Goal of Therapy:  Heparin level 0.3-0.7 units/ml Monitor platelets by anticoagulation protocol: Yes   Plan:  Heparin bolus 2000 units x 1 Increase heparin drip to  1450 units/hr Heparin level in 6 hours Daily CBC  EDolly RiasRPh 10/27/2022, 5:07 AM

## 2022-10-27 NOTE — Progress Notes (Signed)
PROGRESS NOTE  Christina [name]  [id number] DOB: [birth year] DOA: 10/19/2022 PCP: [name], MD   Brief Narrative: Patient is a 47 year old female with history of anxiety,HPV, who presented with complaint of abdominal distention, constipation.  CT abdomen/pelvis showed peritoneal carcinomatosis with extensive metastasis, ascites, multiloculated cystic lesion of the pancreatic tail.  GYN oncology consulted initially .  Underwent paracentesis twice.  Tumor markers are elevated :CA125, CEA. . 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  Oncology, GI following.  S/p  EGD, plan for EUS/biopsy next week .  Hospital course remarkable for persistent abdominal pain, distention, constipation, poor oral intake.  Better care also following for goals of care  Assessment & Plan:  Principal Problem:   Peritoneal carcinomatosis ([place]) Active Problems:   S/P cesarean section   Generalized abdominal pain   Constipation   Ovarian mass   Pancreatic mass   Tobacco abuse   Malignant ascites   Protein-calorie malnutrition ([facility])   Abdominal pain secondary to peritoneal carcinomatosis/ascites: Concern of extensive metastatic disease with unknown primary as seen on the CT scan.  CT concerning for bilateral cystic/solid ovarian masses, pancreatic lesion also noted.  CEA, CA125 elevated, CA 19-9 is normal. Went to cancer center for pelvic examination on 2/7. 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  Medical oncology for palliative care also consulted regarding poor prognosis.  GI consulted, status post EGD on 2/9.  EGD was negative for malignancy in the esophagus, stomach or duodenum but was found to have significant extrinsic compression  in the gastric antrum.  GI planning for endoscopic ultrasound and biopsy next week. Oncology planning to start chemotherapy next week.   Underwent Port-A-Cath placement on 2/9  Ascites: Underwent paracentesis twice, first on 2/3 and second on 2/6.  Another ultrasound-guided paracentesis ordered for today.  Continue supportive care.  Started on spironolactone, Lasix.  Constipation/abdominal distention: Lack of bowel movement for last several days.  Enema ordered on 2/9 with a small bowel movement.  Abdominal x-ray done today did not show any bowel obstruction.  She has bowel sounds. Encouraged for another enema today but patient is reluctant.  Continue aggressive bowel regimen  PE: Asymptomatic.  CT chest done with contrast for staging purpose showed small embolus on the proximal right lower lobe pulmonary artery.  Started on heparin drip.  Will change Eliquis when appropriate.  She is on room air  Microcytic anemia/iron deficiency/folic acid deficiency: Continue iron supplement, folic acid supplement.  With hemoglobin.  Currently in the range of 7-8  Moderate protein calorie malnutrition: Nutritionist consulted and following.  She continues to have poor oral intake.  If this continues,  she might need TPN  Hypokalemia/hypomagnesemia/hyponatremia: Currently being monitored and supplemented as needed  Bilateral lower extremity rash: On knees.  On Benadryl cream for itch  Hypertension: Continue amlodipine 10 mg daily.  I did losartan .blood pressure is high most likely contributed by pain.  Nutrition Problem: Increased nutrient needs Etiology: cancer and cancer related treatments    DVT prophylaxis:     Code Status: Full Code  Family Communication: Sister at the bedside on 2/9  Patient status: Inpatient  Patient is from : Home  Anticipated discharge to: Home  Estimated DC date: After full workup   Consultants: GYN oncology  Procedures: Paracentesis  Antimicrobials:  Anti-infectives (From admission, onward)    None       Subjective: Patient seen and examined at  bedside today.  Uncomfortable due to  abdominal distention, pain.  Had a small bowel movement today.  No nausea or vomiting.  Passing gas  Objective: Vitals:   10/26/22 1625 10/26/22 1938 10/27/22 0432 10/27/22 0437  BP: (!) 170/101 (!) 163/102 (!) 172/103 (!) 157/95  Pulse: 93 97 95 94  Resp: 15 18 18   $ Temp:  98.2 F (36.8 C) 98.2 F (36.8 C)   TempSrc:  Oral Oral   SpO2: 100% 98% 99%   Weight:      Height:        Intake/Output Summary (Last 24 hours) at 10/27/2022 1100 Last data filed at 10/27/2022 1017 Gross per 24 hour  Intake 840.12 ml  Output --  Net 840.12 ml   Filed Weights   10/19/22 1156 10/26/22 1135  Weight: 69.4 kg 69.4 kg    Examination:   General exam: Uncomfortable due to abdominal distention HEENT: PERRL Respiratory system:  no wheezes or crackles  Cardiovascular system: S1 & S2 heard, RRR.  Gastrointestinal system: Abdomen is distended, not much tenderness, bowel sounds present  Central nervous system: Alert and oriented Extremities: No edema, no clubbing ,no cyanosis Skin: No rashes, no ulcers,no icterus     Data Reviewed: I have personally reviewed following labs and imaging studies  CBC: Recent Labs  Lab 10/23/22 0523 10/24/22 0613 10/25/22 2038 10/26/22 0427 10/27/22 0403  WBC 9.7 8.8 11.8* 10.7* 7.6  HGB 8.7* 8.8* 9.2* 8.8* 7.9*  HCT 28.5* 30.9* 29.6* 28.3* 25.7*  MCV 76.2* 80.3 74.2* 74.1* 74.3*  PLT 462* 439* 558* 517* [id number]*   Basic Metabolic Panel: Recent Labs  Lab 10/21/22 0617 10/22/22 0533 10/23/22 0523 10/24/22 0613 10/26/22 0427 10/27/22 0403  NA 133* 130* 134* 131* 128* 129*  K 3.4* 4.1 4.0 4.5 4.6 4.0  CL 95* 93* 95* 93* 90* 92*  CO2 27 28 29 27 28 27  $ GLUCOSE 138* 146* 150* 144* 140* 135*  BUN 12 10 12 14 $ 22* 20  CREATININE 0.66 0.54 0.56 0.60 0.69 0.64  CALCIUM 8.2* 8.4* 8.8* 8.6* 8.4* 8.3*  MG 1.9 2.0 2.0 1.8  --   --   PHOS  --  3.5  --   --   --   --      Recent Results (from the past 240 hour(s))  Fungus Culture With Stain     Status:  None (Preliminary result)   Collection Time: 10/20/22  9:45 AM   Specimen: PATH Cytology Peritoneal fluid  Result Value Ref Range Status   Fungus Stain Final report  Final    Comment: (NOTE) Performed At: [facility] [address] [place], [place] [id number] Rush Farmer MD [id number]    Fungus (Mycology) Culture PENDING  Incomplete   Fungal Source PERITONEAL  Final    Comment: Performed at [facility], [place] [address] [place], [place] [address]  Aerobic/Anaerobic Culture w Gram Stain (surgical/deep wound)     Status: None   Collection Time: 10/20/22  9:45 AM   Specimen: PATH Cytology Peritoneal fluid  Result Value Ref Range Status   Specimen Description   Final    PERITONEAL Performed at [facility] [address] [place], [place] [address]    Special Requests   Final    NONE Performed at [facility], [place] [address] [place], [place] [address]    Gram Stain   Final    RARE WBC PRESENT, PREDOMINANTLY PMN NO ORGANISMS SEEN  Culture   Final    No growth aerobically or anaerobically. Performed at [facility], [place] [address], [place], [place] [address]    Report Status 10/25/2022 FINAL  Final  Fungus Culture Result     Status: None   Collection Time: 10/20/22  9:45 AM  Result Value Ref Range Status   Result 1 Comment  Final    Comment: (NOTE) KOH/Calcofluor preparation:  no fungus observed. Performed At: [facility] [place], [place] [id number] Rush Farmer MD [id number]      Radiology Studies: IR IMAGING GUIDED PORT INSERTION  Result Date: 10/26/2022 CLINICAL DATA:  Peritoneal carcinomatosis and need for porta cath to begin chemotherapy. The patient is currently admitted to the hospital and will begin chemotherapy as an inpatient. EXAM: IMPLANTED PORT A CATH PLACEMENT WITH ULTRASOUND AND FLUOROSCOPIC GUIDANCE ANESTHESIA/SEDATION: Moderate (conscious) sedation  was employed during this procedure. A total of Versed 2.0 mg and Fentanyl 100 mcg was administered intravenously by radiology nursing. Moderate Sedation Time: 34 minutes. The patient's level of consciousness and vital signs were monitored continuously by radiology nursing throughout the procedure under my direct supervision. FLUOROSCOPY: 2.0 mGy PROCEDURE: The procedure, risks, benefits, and alternatives were explained to the patient. Questions regarding the procedure were encouraged and answered. The patient understands and consents to the procedure. A time-out was performed prior to initiating the procedure. Ultrasound was utilized to confirm patency of the right internal jugular vein. A permanent ultrasound image was saved and recorded. The right neck and chest were prepped with chlorhexidine in a sterile fashion, and a sterile drape was applied covering the operative field. Maximum barrier sterile technique with sterile gowns and gloves were used for the procedure. Local anesthesia was provided with 1% lidocaine. After creating a small venotomy incision, a 21 gauge needle was advanced into the right internal jugular vein under direct, real-time ultrasound guidance. Ultrasound image documentation was performed. After securing guidewire access, an 8 Fr dilator was placed. A J-wire was kinked to measure appropriate catheter length. A subcutaneous port pocket was then created along the upper chest wall utilizing sharp and blunt dissection. Portable cautery was utilized. The pocket was irrigated with sterile saline. A single lumen power injectable port was chosen for placement. The 8 Fr catheter was tunneled from the port pocket site to the venotomy incision. The port was placed in the pocket. External catheter was trimmed to appropriate length based on guidewire measurement. At the venotomy, an 8 Fr peel-away sheath was placed over a guidewire. The catheter was then placed through the sheath and the sheath removed.  Final catheter positioning was confirmed and documented with a fluoroscopic spot image. The port was accessed with a needle and aspirated and flushed with heparinized saline. The access needle was left in place. The venotomy and port pocket incisions were closed with subcutaneous 3-0 Monocryl and subcuticular 4-0 Vicryl. Dermabond was applied to both incisions. COMPLICATIONS: COMPLICATIONS None FINDINGS: After catheter placement, the tip lies at the cavo-atrial junction. The catheter aspirates normally and is ready for immediate use. IMPRESSION: Placement of single lumen port a cath via right internal jugular vein. The catheter tip lies at the cavo-atrial junction. A power injectable port a cath was placed and is ready for immediate use. Electronically Signed   By: Aletta Edouard M.D.   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 Small amount gas in rectum. No bowel wall thickening or definite obstruction identified. Diffusely increased attenuation in peritoneal cavity consistent with ascites. No osseous abnormalities. IMPRESSION: Ascites. Nonobstructive bowel gas pattern. Electronically Signed   By: Lavonia Dana M.D.   On: 10/26/2022 10:53   CT CHEST W CONTRAST  Result Date: 10/25/2022 CLINICAL DATA:  Occult malignancy. EXAM: CT CHEST WITH CONTRAST TECHNIQUE: Multidetector CT imaging of the chest was performed during intravenous contrast administration. RADIATION DOSE REDUCTION: This exam was performed according to the departmental dose-optimization program which includes automated exposure control, adjustment of the mA and/or kV according to patient size and/or use of iterative  reconstruction technique. CONTRAST:  29m OMNIPAQUE IOHEXOL 300 MG/ML  SOLN COMPARISON:  CT abdomen 10/19/2022 FINDINGS: Cardiovascular: Heart is normal size. Thoracic aorta is normal in caliber. Pulmonary arterial system is adequately opacified and demonstrates small embolus of the proximal right lower lobar pulmonary artery. No evidence of right heart strain. Remaining vascular structures are normal. Mediastinum/Nodes: No mediastinal or hilar adenopathy. Remaining mediastinal structures are normal. Lungs/Pleura: Lungs are adequately inflated. No airspace consolidation or effusion. Airways are normal. Upper Abdomen: Mild to moderate ascites over the upper abdomen unchanged. Remainder of the upper abdomen is unchanged from the recent prior exam. Musculoskeletal: No focal abnormality. IMPRESSION: 1. Small embolus of the proximal right lower lobar pulmonary artery. No evidence of right heart strain. 2. Mild to moderate ascites over the upper abdomen unchanged from the recent prior exam. Critical Value/emergent results were called by telephone at the time of interpretation on 10/25/2022 at 7:10 p.m. to provider Nurse practitioner, ERufina Falco who verbally acknowledged these results. Electronically Signed   By: DMarin OlpM.D.   On: 10/25/2022 19:11    Scheduled Meds:  (feeding supplement) PROSource Plus  30 mL Oral BID BM   amLODipine  10 mg Oral Daily   feeding supplement  1 Container Oral TID BM   ferrous sulfate  325 mg Oral Q breakfast   folic acid  1 mg Oral Daily   furosemide  40 mg Oral Daily   multivitamin with minerals  1 tablet Oral Daily   ondansetron (ZOFRAN) IV  4 mg Intravenous Q8H   polyethylene glycol  17 g Oral BID   senna-docusate  2 tablet Oral QHS   spironolactone  25 mg Oral Daily   Continuous Infusions:  heparin 1,450 Units/hr (10/27/22 0513)     LOS: 7 days   AShelly Coss MD [facility] P2/06/2023, 11:00 AM

## 2022-10-27 NOTE — Progress Notes (Signed)
ANTICOAGULATION CONSULT NOTE   Pharmacy Consult for IV heparin Indication: pulmonary embolus  Allergies  Allergen Reactions   Claritin [Loratadine] Other (See Comments)    causes spasms   Diphenhydramine Other (See Comments)    Causes spasms and keeps alert for days   Lorazepam Other (See Comments)    After a few doses it made her feel paranoid    Patient Measurements: Height: 4' 11"$  (149.9 cm) Weight: 69.4 kg (153 lb) IBW/kg (Calculated) : 43.2 Heparin Dosing Weight: 59 kg  Vital Signs: Temp: 98.2 F (36.8 C) (02/10 1359) Temp Source: Oral (02/10 1359) BP: 139/93 (02/10 1359) Pulse Rate: 96 (02/10 1359)  Labs: Recent Labs    10/25/22 2038 10/25/22 2038 10/26/22 0427 10/27/22 0403 10/27/22 1154 10/27/22 1820  HGB 9.2*  --  8.8* 7.9*  --   --   HCT 29.6*  --  28.3* 25.7*  --   --   PLT 558*  --  517* 416*  --   --   HEPARINUNFRC  --    < > 0.13* 0.13* 0.33 0.16*  CREATININE  --   --  0.69 0.64  --   --    < > = values in this interval not displayed.     Estimated Creatinine Clearance: 72.9 mL/min (by C-G formula based on SCr of 0.64 mg/dL).  Assessment: 50 yoF admitted on 2/2 with new finding of metastatic peritoneal carcinomatosis/ascites. CT chest on 2/8 showed incidental finding of small PE. Pharmacy is consulted to dose IV heparin.  No prior to admission anticoagulation Lovenox 54m SQ daily ordered for inpatient VTE prophylaxis (last dose on 2/8 at 1203) Baseline PT/INR WNL on 2/3  Today, 10/27/2022: Confirmatory HL 0.16, with the heparin drip at 1450 units/hr Hgb 7.9, plts high Per RN no bleeding noted  Goal of Therapy:  Heparin level 0.3-0.7 units/ml Monitor platelets by anticoagulation protocol: Yes   Plan:  Increase  heparin drip to 1650 units/hr Obtain  Heparin level in 6 hours Daily CBC & daily heparin level   NRoyetta Asal PharmD, BCPS 10/27/2022 7:31 PM

## 2022-10-27 NOTE — Progress Notes (Signed)
Pt ambulated this shift and had 1 small BM. Pt requested to wait until AM for soap sud enema so she could rest. Pt continues with abdominal pain - PRN medications effective. Heparin gtt resumed - see MAR for changes. Pt resting with bed in locked and lowest position and call bell in reach. Plan of care continues.

## 2022-10-27 NOTE — Progress Notes (Signed)
ANTICOAGULATION CONSULT NOTE   Pharmacy Consult for IV heparin Indication: pulmonary embolus  Allergies  Allergen Reactions   Claritin [Loratadine] Other (See Comments)    causes spasms   Diphenhydramine Other (See Comments)    Causes spasms and keeps alert for days   Lorazepam Other (See Comments)    After a few doses it made her feel paranoid    Patient Measurements: Height: 4' 11"$  (149.9 cm) Weight: 69.4 kg (153 lb) IBW/kg (Calculated) : 43.2 Heparin Dosing Weight: 59 kg  Vital Signs: Temp: 98.2 F (36.8 C) (02/10 0432) Temp Source: Oral (02/10 0432) BP: 157/95 (02/10 0437) Pulse Rate: 94 (02/10 0437)  Labs: Recent Labs    10/25/22 2038 10/26/22 0427 10/27/22 0403 10/27/22 1154  HGB 9.2* 8.8* 7.9*  --   HCT 29.6* 28.3* 25.7*  --   PLT 558* 517* 416*  --   HEPARINUNFRC  --  0.13* 0.13* 0.33  CREATININE  --  0.69 0.64  --      Estimated Creatinine Clearance: 72.9 mL/min (by C-G formula based on SCr of 0.64 mg/dL).  Assessment: 33 yoF admitted on 2/2 with new finding of metastatic peritoneal carcinomatosis/ascites. CT chest on 2/8 showed incidental finding of small PE. Pharmacy is consulted to dose IV heparin.  No prior to admission anticoagulation Lovenox 68m SQ daily ordered for inpatient VTE prophylaxis (last dose on 2/8 at 1203) Baseline PT/INR WNL on 2/3  Today, 10/27/2022: HL 0.33 therapeutic after 2000 unit bolus and heparin drip increased to 1450 units/hr Hgb 7.9, plts high Per RN no bleeding noted  Goal of Therapy:  Heparin level 0.3-0.7 units/ml Monitor platelets by anticoagulation protocol: Yes   Plan:  Continue heparin drip @1450$  units/hr Confirmatory Heparin level in 6 hours Daily CBC & daily heparin level  MEudelia Bunch Pharm.D Use secure chat for questions 10/27/2022 12:28 PM

## 2022-10-27 NOTE — Progress Notes (Signed)
Daily Progress Note   Patient Name: Christina [name]       Date: 10/27/2022 DOB: 1976/01/07  Age: 47 y.o. MRN#: [id number] Attending Physician: Shelly Coss, MD Primary Care Physician: [name], MD Admit Date: 10/19/2022  Reason for Consultation/Follow-up: Establishing goals of care  Subjective: Awake alert, nausea better, had a bowel movement, passing gas, states that she had a busy day yesterday and she did not rest well overnight. In good spirits.   Length of Stay: 7  Current Medications: Scheduled Meds:   (feeding supplement) PROSource Plus  30 mL Oral BID BM   amLODipine  10 mg Oral Daily   feeding supplement  1 Container Oral TID BM   ferrous sulfate  325 mg Oral Q breakfast   folic acid  1 mg Oral Daily   furosemide  40 mg Oral Daily   multivitamin with minerals  1 tablet Oral Daily   ondansetron (ZOFRAN) IV  4 mg Intravenous Q8H   polyethylene glycol  17 g Oral BID   senna-docusate  2 tablet Oral QHS   spironolactone  25 mg Oral Daily    Continuous Infusions:  heparin 1,450 Units/hr (10/27/22 0513)    PRN Meds: acetaminophen, alum & mag hydroxide-simeth, cyclobenzaprine, diphenhydrAMINE-zinc acetate, guaiFENesin, hydrALAZINE, HYDROcodone-acetaminophen, HYDROmorphone (DILAUDID) injection, ipratropium-albuterol, metoprolol tartrate, naLOXone (NARCAN)  injection, ondansetron (ZOFRAN) IV, traZODone  Physical Exam         Awake alert No distress Regular work of breathing S 1 S 2 Mild abdominal distension and discomfort Trace edema Awake alert oriented.   Vital Signs: BP (!) 157/95 (BP Location: Right Arm)   Pulse 94   Temp 98.2 F (36.8 C) (Oral)   Resp 18   Ht 4' 11"$  (1.499 m)   Wt 69.4 kg   LMP 10/12/2022   SpO2 99%   BMI 30.90 kg/m  SpO2: SpO2: 99  % O2 Device: O2 Device: Room Air O2 Flow Rate: O2 Flow Rate (L/min): 2 L/min  Intake/output summary:  Intake/Output Summary (Last 24 hours) at 10/27/2022 1012 Last data filed at 10/27/2022 0324 Gross per 24 hour  Intake 720.12 ml  Output --  Net 720.12 ml   LBM: Last BM Date : 10/26/22 (very very SMALL BM) Baseline Weight: Weight: 69.4 kg Most recent weight: Weight: 69.4 kg  Palliative Assessment/Data:      Patient Active Problem List   Diagnosis Date Noted   Generalized abdominal pain 10/22/2022   Constipation 10/22/2022   Ovarian mass 10/22/2022   Pancreatic mass 10/22/2022   Tobacco abuse 10/22/2022   Malignant ascites 10/22/2022   Protein-calorie malnutrition ([place]) 10/22/2022   Peritoneal carcinomatosis ([place]) 10/19/2022   Postop check 06/25/2016   Encounter for sterilization 06/12/2016   Well woman exam with routine gynecological exam 04/30/2016   Encounter for contraceptive management 04/30/2016   S/P cesarean section 07/14/2014    Palliative Care Assessment & Plan   Patient Profile:    Assessment:  EGD and port placed on 10-26-22.  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 Malignant ascites status post paracentesis twice Recurrent N/V and constipation, secondary to #1, partial bowel obstruction due to the extensive peritoneal metastasis Moderate protein and calorie malnutrition Small PE  Recommendations/Plan:  Continue current pain and non pain symptom management Full code full scope Patient is aware of EGD findings:  negative for malignancy in the esophagus, stomach or duodenum.  However significant extrinsic compression was noticed in the gastric antrum. Patient understands that she might undergo EUS and biopsy next week.      Goals of Care and Additional Recommendations: Limitations on Scope of Treatment: Full Scope Treatment  Code Status:    Code Status Orders  (From  admission, onward)           Start     Ordered   10/20/22 1405  Full code  Continuous       Question:  By:  Answer:  Consent: discussion documented in EHR   10/20/22 1404           Code Status History     Date Active Date Inactive Code Status Order ID Comments User Context   05/23/2015 0704 05/27/2015 1545 Full Code [id number]  Cheri Fowler, MD Inpatient   07/14/2014 1022 07/16/2014 1623 Full Code [id number]  Cheri Fowler, MD Inpatient       Prognosis:  Unable to determine  Discharge Planning: To Be Determined  Care plan was discussed with patient.   Thank you for allowing the Palliative Medicine Team to assist in the care of this patient.  Mod MDM     Greater than 50%  of this time was spent counseling and coordinating care related to the above assessment and plan.  Loistine Chance, MD  Please contact Palliative Medicine Team phone at [phone] for questions and concerns.

## 2022-10-28 ENCOUNTER — Inpatient Hospital Stay (HOSPITAL_COMMUNITY): Payer: Medicaid Other

## 2022-10-28 ENCOUNTER — Inpatient Hospital Stay: Payer: Self-pay

## 2022-10-28 DIAGNOSIS — C786 Secondary malignant neoplasm of retroperitoneum and peritoneum: Secondary | ICD-10-CM | POA: Diagnosis not present

## 2022-10-28 LAB — CBC
HCT: 28.4 % — ABNORMAL LOW (ref 36.0–46.0)
Hemoglobin: 8.7 g/dL — ABNORMAL LOW (ref 12.0–15.0)
MCH: 22.8 pg — ABNORMAL LOW (ref 26.0–34.0)
MCHC: 30.6 g/dL (ref 30.0–36.0)
MCV: 74.3 fL — ABNORMAL LOW (ref 80.0–100.0)
Platelets: 506 10*3/uL — ABNORMAL HIGH (ref 150–400)
RBC: 3.82 MIL/uL — ABNORMAL LOW (ref 3.87–5.11)
RDW: 16.1 % — ABNORMAL HIGH (ref 11.5–15.5)
WBC: 8.5 10*3/uL (ref 4.0–10.5)
nRBC: 0.2 % (ref 0.0–0.2)

## 2022-10-28 LAB — BASIC METABOLIC PANEL
Anion gap: 13 (ref 5–15)
BUN: 21 mg/dL — ABNORMAL HIGH (ref 6–20)
CO2: 29 mmol/L (ref 22–32)
Calcium: 8.2 mg/dL — ABNORMAL LOW (ref 8.9–10.3)
Chloride: 87 mmol/L — ABNORMAL LOW (ref 98–111)
Creatinine, Ser: 0.93 mg/dL (ref 0.44–1.00)
GFR, Estimated: >60 mL/min (ref >60)
Glucose, Bld: 148 mg/dL — ABNORMAL HIGH (ref 70–99)
Potassium: 3.9 mmol/L (ref 3.5–5.1)
Sodium: 129 mmol/L — ABNORMAL LOW (ref 135–145)

## 2022-10-28 LAB — HEPARIN LEVEL (UNFRACTIONATED)
Heparin Unfractionated: 0.31 IU/mL (ref 0.30–0.70)
Heparin Unfractionated: 0.47 IU/mL (ref 0.30–0.70)

## 2022-10-28 MED ORDER — TRAVASOL 10 % IV SOLN: INTRAVENOUS | Status: DC

## 2022-10-28 MED ORDER — IOHEXOL 9 MG/ML PO SOLN
ORAL | Status: AC
  Filled 2022-10-28: qty 1000

## 2022-10-28 MED ORDER — IOHEXOL 300 MG/ML  SOLN
100.0000 mL | Freq: Once | INTRAMUSCULAR | Status: AC | PRN
  Administered 2022-10-28: 100 mL via INTRAVENOUS

## 2022-10-28 MED ORDER — INSULIN ASPART 100 UNIT/ML IJ SOLN
0.0000 [IU] | Freq: Four times a day (QID) | INTRAMUSCULAR | Status: DC
  Administered 2022-10-29: 1 [IU] via SUBCUTANEOUS
  Administered 2022-10-30 (×4): 2 [IU] via SUBCUTANEOUS
  Administered 2022-10-31: 5 [IU] via SUBCUTANEOUS
  Administered 2022-10-31: 2 [IU] via SUBCUTANEOUS

## 2022-10-28 MED ORDER — ONDANSETRON HCL 4 MG/2ML IJ SOLN
4.0000 mg | Freq: Three times a day (TID) | INTRAMUSCULAR | Status: AC
  Administered 2022-10-28 – 2022-10-30 (×5): 4 mg via INTRAVENOUS
  Filled 2022-10-28 (×6): qty 2

## 2022-10-28 MED ORDER — HYDROMORPHONE HCL 1 MG/ML IJ SOLN
1.0000 mg | INTRAMUSCULAR | Status: DC | PRN
  Administered 2022-10-28: 1 mg via INTRAVENOUS
  Administered 2022-10-28 (×2): 1.5 mg via INTRAVENOUS
  Administered 2022-10-29: 1 mg via INTRAVENOUS
  Administered 2022-10-29: 1.5 mg via INTRAVENOUS
  Administered 2022-10-29: 1 mg via INTRAVENOUS
  Administered 2022-10-29 – 2022-10-30 (×8): 1.5 mg via INTRAVENOUS
  Administered 2022-10-30: 1 mg via INTRAVENOUS
  Filled 2022-10-28 (×8): qty 1.5
  Filled 2022-10-28 (×2): qty 1
  Filled 2022-10-28: qty 1.5
  Filled 2022-10-28 (×2): qty 1
  Filled 2022-10-28 (×2): qty 1.5

## 2022-10-28 MED ORDER — OXYCODONE HCL 5 MG PO TABS
10.0000 mg | ORAL_TABLET | ORAL | Status: DC | PRN
  Administered 2022-10-28 (×2): 10 mg via ORAL
  Filled 2022-10-28 (×2): qty 2

## 2022-10-28 NOTE — Progress Notes (Signed)
Daily Progress Note   Patient Name: Christina [name]       Date: 10/28/2022 DOB: 02-Oct-1975  Age: 47 y.o. MRN#: [id number] Attending Physician: Shelly Coss, MD Primary Care Physician: [name], MD Admit Date: 10/19/2022  Reason for Consultation/Follow-up: Establishing goals of care  Subjective: Awake alert, friend at bedside Patient is tearful, has abdominal distension and worsening pain.  Passing gas and burping.   Length of Stay: 8  Current Medications: Scheduled Meds:   (feeding supplement) PROSource Plus  30 mL Oral BID BM   amLODipine  10 mg Oral Daily   Chlorhexidine Gluconate Cloth  6 each Topical Daily   feeding supplement  1 Container Oral TID BM   ferrous sulfate  325 mg Oral Q breakfast   folic acid  1 mg Oral Daily   furosemide  40 mg Oral Daily   losartan  50 mg Oral Daily   multivitamin with minerals  1 tablet Oral Daily   ondansetron (ZOFRAN) IV  4 mg Intravenous Q8H   polyethylene glycol  17 g Oral BID   senna-docusate  2 tablet Oral QHS   spironolactone  25 mg Oral Daily    Continuous Infusions:  heparin 1,750 Units/hr (10/28/22 [id number])    PRN Meds: acetaminophen, alum & mag hydroxide-simeth, cyclobenzaprine, diphenhydrAMINE-zinc acetate, guaiFENesin, hydrALAZINE, HYDROmorphone (DILAUDID) injection, ipratropium-albuterol, metoprolol tartrate, naLOXone (NARCAN)  injection, ondansetron (ZOFRAN) IV, oxyCODONE, traZODone  Physical Exam         Awake alert moderate distress Regular work of breathing S 1 S 2 More abdominal distension and discomfort Trace edema Awake alert oriented.   Vital Signs: BP (!) 137/92 (BP Location: Right Arm)   Pulse 100   Temp 98.2 F (36.8 C) (Oral)   Resp 15   Ht 4' 11"$  (1.499 m)   Wt 68.1 kg   LMP 10/12/2022   SpO2  96%   BMI 30.32 kg/m  SpO2: SpO2: 96 % O2 Device: O2 Device: Room Air O2 Flow Rate: O2 Flow Rate (L/min): 2 L/min  Intake/output summary:  Intake/Output Summary (Last 24 hours) at 10/28/2022 1148 Last data filed at 10/27/2022 1414 Gross per 24 hour  Intake 120 ml  Output --  Net 120 ml    LBM: Last BM Date : 10/27/22 Baseline Weight: Weight: 69.4 kg Most recent weight: Weight:  68.1 kg       Palliative Assessment/Data:      Patient Active Problem List   Diagnosis Date Noted   Generalized abdominal pain 10/22/2022   Constipation 10/22/2022   Ovarian mass 10/22/2022   Pancreatic mass 10/22/2022   Tobacco abuse 10/22/2022   Malignant ascites 10/22/2022   Protein-calorie malnutrition ([place]) 10/22/2022   Peritoneal carcinomatosis ([place]) 10/19/2022   Postop check 06/25/2016   Encounter for sterilization 06/12/2016   Well woman exam with routine gynecological exam 04/30/2016   Encounter for contraceptive management 04/30/2016   S/P cesarean section 07/14/2014    Palliative Care Assessment & Plan   Patient Profile:    Assessment:  EGD and port placed on 10-26-22.  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 Malignant ascites status post paracentesis twice Recurrent N/V and constipation, secondary to #1, partial bowel obstruction due to the extensive peritoneal metastasis Moderate protein and calorie malnutrition Small PE  Recommendations/Plan: Discussed with patient about current pain and non pain symptom management: slightly increase IV Dilaudid dose PRN, d/c Norco and start PO Oxy IR PRN. GI following. CT abdomen from earlier in this hospitalization noted. Scheduled Zofran and PRN. Monitor symptoms.   Full code full scope Patient is aware of EGD findings:  negative for malignancy in the esophagus, stomach or duodenum.  However significant extrinsic compression was noticed in the gastric antrum. Patient  understands that she might undergo EUS and biopsy next week.      Goals of Care and Additional Recommendations: Limitations on Scope of Treatment: Full Scope Treatment  Code Status:    Code Status Orders  (From admission, onward)           Start     Ordered   10/20/22 1405  Full code  Continuous       Question:  By:  Answer:  Consent: discussion documented in EHR   10/20/22 1404           Code Status History     Date Active Date Inactive Code Status Order ID Comments User Context   05/23/2015 0704 05/27/2015 1545 Full Code [id number]  Cheri Fowler, MD Inpatient   07/14/2014 1022 07/16/2014 1623 Full Code [id number]  Cheri Fowler, MD Inpatient       Prognosis:  Unable to determine  Discharge Planning: To Be Determined  Care plan was discussed with patient.  RN TRH MD.  Thank you for allowing the Palliative Medicine Team to assist in the care of this patient.  High MDM     Greater than 50%  of this time was spent counseling and coordinating care related to the above assessment and plan.  Loistine Chance, MD  Please contact Palliative Medicine Team phone at [phone] for questions and concerns.

## 2022-10-28 NOTE — Progress Notes (Signed)
[place] for IV heparin Indication: pulmonary embolus  Allergies  Allergen Reactions   Claritin [Loratadine] Other (See Comments)    causes spasms   Diphenhydramine Other (See Comments)    Causes spasms and keeps alert for days   Lorazepam Other (See Comments)    After a few doses it made her feel paranoid    Patient Measurements: Height: 4' 11"$  (149.9 cm) Weight: 68.1 kg (150 lb 2.1 oz) IBW/kg (Calculated) : 43.2 Heparin Dosing Weight: 59 kg  Vital Signs: Temp: 98.2 F (36.8 C) (02/11 0550) Temp Source: Oral (02/11 0550) BP: 137/92 (02/11 0550) Pulse Rate: 100 (02/11 0550)  Labs: Recent Labs    10/26/22 0427 10/27/22 0403 10/27/22 1154 10/27/22 1820 10/28/22 0103 10/28/22 0805  HGB 8.8* 7.9*  --   --  8.7*  --   HCT 28.3* 25.7*  --   --  28.4*  --   PLT 517* 416*  --   --  506*  --   HEPARINUNFRC 0.13* 0.13*   < > 0.16* 0.31 0.47  CREATININE 0.69 0.64  --   --  0.93  --    < > = values in this interval not displayed.     Estimated Creatinine Clearance: 62.1 mL/min (by C-G formula based on SCr of 0.93 mg/dL).  Assessment: 52 yoF admitted on 2/2 with new finding of metastatic peritoneal carcinomatosis/ascites. CT chest on 2/8 showed incidental finding of small PE. Pharmacy is consulted to dose IV heparin.  No prior to admission anticoagulation Lovenox 41m SQ daily ordered for inpatient VTE prophylaxis (last dose on 2/8 at 1203) Baseline PT/INR WNL on 2/3  Today, 10/28/2022: HL 0.47 therapeutic after rate increased to 1750 units/hr Hgb 8.7, plts high Per RN no bleeding noted  Goal of Therapy:  Heparin level 0.3-0.7 units/ml Monitor platelets by anticoagulation protocol: Yes   Plan:  continue  heparin drip @ 1750 units/hr  Daily CBC & daily heparin level For EUS/biopsy this week F/u to start Eliquis when appropriate   MEudelia Bunch Pharm.D Use secure chat for questions 10/28/2022 8:56 AM

## 2022-10-28 NOTE — Progress Notes (Signed)
ANTICOAGULATION CONSULT NOTE   Pharmacy Consult for IV heparin Indication: pulmonary embolus  Allergies  Allergen Reactions   Claritin [Loratadine] Other (See Comments)    causes spasms   Diphenhydramine Other (See Comments)    Causes spasms and keeps alert for days   Lorazepam Other (See Comments)    After a few doses it made her feel paranoid    Patient Measurements: Height: 4' 11"$  (149.9 cm) Weight: 69.4 kg (153 lb) IBW/kg (Calculated) : 43.2 Heparin Dosing Weight: 59 kg  Vital Signs: Temp: 98.2 F (36.8 C) (02/10 2208) Temp Source: Oral (02/10 2208) BP: 128/84 (02/10 2208) Pulse Rate: 105 (02/10 2208)  Labs: Recent Labs    10/26/22 0427 10/27/22 0403 10/27/22 1154 10/27/22 1820 10/28/22 0103  HGB 8.8* 7.9*  --   --  8.7*  HCT 28.3* 25.7*  --   --  28.4*  PLT 517* 416*  --   --  506*  HEPARINUNFRC 0.13* 0.13* 0.33 0.16* 0.31  CREATININE 0.69 0.64  --   --  0.93     Estimated Creatinine Clearance: 62.7 mL/min (by C-G formula based on SCr of 0.93 mg/dL).  Assessment: 63 yoF admitted on 2/2 with new finding of metastatic peritoneal carcinomatosis/ascites. CT chest on 2/8 showed incidental finding of small PE. Pharmacy is consulted to dose IV heparin.  No prior to admission anticoagulation Lovenox 40m SQ daily ordered for inpatient VTE prophylaxis (last dose on 2/8 at 1203) Baseline PT/INR WNL on 2/3  Today, 10/28/2022: HL 0.31 low end of therapeutic on 1650 units/hr Hgb 8.7, plts high Per RN no bleeding noted  Goal of Therapy:  Heparin level 0.3-0.7 units/ml Monitor platelets by anticoagulation protocol: Yes   Plan:  Increase  heparin drip slightly to 1750 units/hr to keep from drifting into subtherapeutic range Obtain  Heparin level in 6 hours Daily CBC & daily heparin level   EDolly RiasRPh 10/28/2022, 1:42 AM

## 2022-10-28 NOTE — Progress Notes (Signed)
[facility] Progress Note  Christina [name] 47 y.o. Jul 02, 1976   Subjective: Abdominal pain worse today. Passing gas. Denies N/V. No BMs overnight.  Objective: Vital signs: Vitals:   10/27/22 2208 10/28/22 0550  BP: 128/84 (!) 137/92  Pulse: (!) 105 100  Resp: 18 15  Temp: 98.2 F (36.8 C) 98.2 F (36.8 C)  SpO2: 98% 96%    Physical Exam: Gen: lethargic, thin, no acute distress  HEENT: anicteric sclera CV: RRR Chest: CTA B Abd: +distention, diffuse tenderness with guarding, +BS Ext: no edema  Lab Results: Recent Labs    10/27/22 0403 10/28/22 0103  NA 129* 129*  K 4.0 3.9  CL 92* 87*  CO2 27 29  GLUCOSE 135* 148*  BUN 20 21*  CREATININE 0.64 0.93  CALCIUM 8.3* 8.2*   No results for input(s): "AST", "ALT", "ALKPHOS", "BILITOT", "PROT", "ALBUMIN" in the last 72 hours. Recent Labs    10/27/22 0403 10/28/22 0103  WBC 7.6 8.5  HGB 7.9* 8.7*  HCT 25.7* 28.4*  MCV 74.3* 74.3*  PLT 416* 506*      Assessment/Plan: Peritoneal carcinomatosis with unknown primary. EGD showed gastric outlet obstruction with extrinsic compression. Biopsies pending. Needs an EUS with biopsy if possible for further evaluation to help guide management. NPO p MN in case procedure done tomorrow but defer to Dr. Paulita Fujita. Dr. Paulita Fujita will evaluate tomorrow and decide on EUS and timing of it.   Christina [name] 10/28/2022, 10:26 AM  Questions please call [phone] ID: Christina [name], female   DOB: [birth year], 47 y.o.   MRN: [id number]

## 2022-10-28 NOTE — Progress Notes (Signed)
PROGRESS NOTE  Christina [name]  [id number] DOB: 11/11/1975 DOA: 10/19/2022 PCP: [name], MD   Brief Narrative: Patient is a 47 year old female with history of anxiety,HPV, who presented with complaint of abdominal distention, constipation.  CT abdomen/pelvis showed peritoneal carcinomatosis with extensive metastasis, ascites, multiloculated cystic lesion of the pancreatic tail.  GYN oncology consulted initially .  Underwent paracentesis twice.  Tumor markers are elevated :CA125, CEA. . 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  Oncology, GI following.  S/p  EGD, plan for EUS/biopsy tomorrow. Marland Kitchen  Hospital course remarkable for persistent abdominal pain, distention, constipation, poor oral intake.  Palliative care also following for goals of care  Assessment & Plan:  Principal Problem:   Peritoneal carcinomatosis ([facility]) Active Problems:   S/P cesarean section   Generalized abdominal pain   Constipation   Ovarian mass   Pancreatic mass   Tobacco abuse   Malignant ascites   Protein-calorie malnutrition ([facility])   Abdominal pain secondary to peritoneal carcinomatosis/ascites: Concern of extensive metastatic disease with unknown primary as seen on the CT scan.  CT concerning for bilateral cystic/solid ovarian masses, pancreatic lesion also noted.  CEA, CA125 elevated, CA 19-9 is normal. Went to cancer center for pelvic examination on 2/7. 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  Medical oncology for palliative care also consulted regarding poor prognosis.  GI consulted, status post EGD on 2/9.  EGD was negative for malignancy in the esophagus, stomach or duodenum but was found to have significant extrinsic compression  in the gastric antrum.  GI planning for endoscopic ultrasound and biopsy tomorrow Oncology planning to start chemotherapy next week.   Underwent Port-A-Cath placement on 2/9  Ascites: Underwent paracentesis twice, first on 2/3 , second on 2/6, 3rd on 2/10.   Continue supportive care.  Started on spironolactone, Lasix.  Constipation/abdominal distention:Abdominal x-ray done on 2/9 did not show any bowel obstruction.  She has bowel sounds. Continue aggressive bowel regimen  PE: Asymptomatic.  CT chest done with contrast for staging purpose showed small embolus on the proximal right lower lobe pulmonary artery.  Started on heparin drip.  Will change Eliquis when appropriate.  She is on room air  Microcytic anemia/iron deficiency/folic acid deficiency: Continue iron supplement, folic acid supplement.  With hemoglobin.  Currently in the range of 7-8  Moderate protein calorie malnutrition: Nutritionist consulted and following.  She continues to have poor oral intake.  If this continues,  she might need TPN  Hypokalemia/hypomagnesemia/hyponatremia: Currently being monitored and supplemented as needed  Hypertension: Continue amlodipine 10 mg , losartan .  Nutrition Problem: Increased nutrient needs Etiology: cancer and cancer related treatments    DVT prophylaxis:     Code Status: Full Code  Family Communication: Sister at the bedside on 2/9  Patient status: Inpatient  Patient is from : Home  Anticipated discharge to: Home  Estimated DC date: After full workup   Consultants: GYN oncology  Procedures: Paracentesis  Antimicrobials:  Anti-infectives (From admission, onward)    None       Subjective: Patient seen and examined at bedside today.  Hemodynamically stable.  She looked comfortable than yesterday.  She is having bowel movements now.  Also passing gas.  Objective: Vitals:   10/26/22 1625 10/26/22 1938 10/27/22 0432 10/27/22 0437  BP: (!) 170/101 (!) 163/102 (!) 172/103 (!) 157/95  Pulse: 93 97 95 94  Resp: 15 18 18  Temp:  98.2 F (36.8 C) 98.2 F (36.8 C)   TempSrc:  Oral Oral   SpO2:  100% 98% 99%   Weight:      Height:        Intake/Output Summary (Last 24 hours) at 10/27/2022 1100 Last data filed at 10/27/2022 1017 Gross per 24 hour  Intake 840.12 ml  Output --  Net 840.12 ml   Filed Weights   10/19/22 1156 10/26/22 1135  Weight: 69.4 kg 69.4 kg    Examination:    General exam: Overall comfortable, not in distress, deconditioned, weak HEENT: PERRL Respiratory system:  no wheezes or crackles  Cardiovascular system: S1 & S2 heard, RRR.  Chemo-Port Gastrointestinal system: Abdomen is distended, bowel sounds present Central nervous system: Alert and oriented Extremities: No edema, no clubbing ,no cyanosis Skin: No rashes, no ulcers,no icterus     Data Reviewed: I have personally reviewed following labs and imaging studies  CBC: Recent Labs  Lab 10/23/22 0523 10/24/22 0613 10/25/22 2038 10/26/22 0427 10/27/22 0403  WBC 9.7 8.8 11.8* 10.7* 7.6  HGB 8.7* 8.8* 9.2* 8.8* 7.9*  HCT 28.5* 30.9* 29.6* 28.3* 25.7*  MCV 76.2* 80.3 74.2* 74.1* 74.3*  PLT 462* 439* 558* 517* 123456*   Basic Metabolic Panel: Recent Labs  Lab 10/21/22 0617 10/22/22 0533 10/23/22 0523 10/24/22 0613 10/26/22 0427 10/27/22 0403  NA 133* 130* 134* 131* 128* 129*  K 3.4* 4.1 4.0 4.5 4.6 4.0  CL 95* 93* 95* 93* 90* 92*  CO2 27 28 29 27 28 27  $ GLUCOSE 138* 146* 150* 144* 140* 135*  BUN 12 10 12 14 $ 22* 20  CREATININE 0.66 0.54 0.56 0.60 0.69 0.64  CALCIUM 8.2* 8.4* 8.8* 8.6* 8.4* 8.3*  MG 1.9 2.0 2.0 1.8  --   --   PHOS  --  3.5  --   --   --   --      Recent Results (from the past 240 hour(s))  Fungus Culture With Stain     Status: None (Preliminary result)   Collection Time: 10/20/22  9:45 AM   Specimen: PATH Cytology Peritoneal fluid  Result Value Ref Range Status   Fungus Stain Final report  Final    Comment: (NOTE) Performed At: [facility] [address] [place], [place] [id number] Rush Farmer MD [id number]    Fungus (Mycology) Culture  PENDING  Incomplete   Fungal Source PERITONEAL  Final    Comment: Performed at [facility], [place] [address] [place], [place] [address]  Aerobic/Anaerobic Culture w Gram Stain (surgical/deep wound)     Status: None   Collection Time: 10/20/22  9:45 AM   Specimen: PATH Cytology Peritoneal fluid  Result Value Ref Range Status   Specimen Description   Final    PERITONEAL Performed at [place] [address], [place], [place] [address]    Special Requests   Final    NONE Performed at [facility], [place] [address], [place], [place] [address]    Gram Stain   Final    RARE WBC PRESENT, PREDOMINANTLY PMN NO ORGANISMS SEEN    Culture   Final    No growth aerobically or anaerobically. Performed at [facility], [place] [address], [place], [place] [address]    Report Status 10/25/2022 FINAL  Final  Fungus Culture Result     Status: None   Collection Time: 10/20/22  9:45 AM  Result Value Ref Range Status  Result 1 Comment  Final    Comment: (NOTE) KOH/Calcofluor preparation:  no fungus observed. Performed At: [facility] [place], [place] [id number] Rush Farmer MD [id number]      Radiology Studies: IR IMAGING GUIDED PORT INSERTION  Result Date: 10/26/2022 CLINICAL DATA:  Peritoneal carcinomatosis and need for porta cath to begin chemotherapy. The patient is currently admitted to the hospital and will begin chemotherapy as an inpatient. EXAM: IMPLANTED PORT A CATH PLACEMENT WITH ULTRASOUND AND FLUOROSCOPIC GUIDANCE ANESTHESIA/SEDATION: Moderate (conscious) sedation was employed during this procedure. A total of Versed 2.0 mg and Fentanyl 100 mcg was administered intravenously by radiology nursing. Moderate Sedation Time: 34 minutes. The patient's level of consciousness and vital signs were monitored continuously by radiology nursing throughout the procedure under my direct supervision. FLUOROSCOPY:  2.0 mGy PROCEDURE: The procedure, risks, benefits, and alternatives were explained to the patient. Questions regarding the procedure were encouraged and answered. The patient understands and consents to the procedure. A time-out was performed prior to initiating the procedure. Ultrasound was utilized to confirm patency of the right internal jugular vein. A permanent ultrasound image was saved and recorded. The right neck and chest were prepped with chlorhexidine in a sterile fashion, and a sterile drape was applied covering the operative field. Maximum barrier sterile technique with sterile gowns and gloves were used for the procedure. Local anesthesia was provided with 1% lidocaine. After creating a small venotomy incision, a 21 gauge needle was advanced into the right internal jugular vein under direct, real-time ultrasound guidance. Ultrasound image documentation was performed. After securing guidewire access, an 8 Fr dilator was placed. A J-wire was kinked to measure appropriate catheter length. A subcutaneous port pocket was then created along the upper chest wall utilizing sharp and blunt dissection. Portable cautery was utilized. The pocket was irrigated with sterile saline. A single lumen power injectable port was chosen for placement. The 8 Fr catheter was tunneled from the port pocket site to the venotomy incision. The port was placed in the pocket. External catheter was trimmed to appropriate length based on guidewire measurement. At the venotomy, an 8 Fr peel-away sheath was placed over a guidewire. The catheter was then placed through the sheath and the sheath removed. Final catheter positioning was confirmed and documented with a fluoroscopic spot image. The port was accessed with a needle and aspirated and flushed with heparinized saline. The access needle was left in place. The venotomy and port pocket incisions were closed with subcutaneous 3-0 Monocryl and subcuticular 4-0 Vicryl. Dermabond was  applied to both incisions. COMPLICATIONS: COMPLICATIONS None FINDINGS: After catheter placement, the tip lies at the cavo-atrial junction. The catheter aspirates normally and is ready for immediate use. IMPRESSION: Placement of single lumen port a cath via right internal jugular vein. The catheter tip lies at the cavo-atrial junction. A power injectable port a cath was placed and is ready for immediate use. Electronically Signed   By: Aletta Edouard M.D.   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 Small amount gas in rectum. No bowel wall thickening or definite obstruction identified. Diffusely increased attenuation in peritoneal cavity consistent with ascites. No osseous abnormalities. IMPRESSION: Ascites. Nonobstructive bowel  gas pattern. Electronically Signed   By: Lavonia Dana M.D.   On: 10/26/2022 10:53   CT CHEST W CONTRAST  Result Date: 10/25/2022 CLINICAL DATA:  Occult malignancy. EXAM: CT CHEST WITH CONTRAST TECHNIQUE: Multidetector CT imaging of the chest was performed during intravenous contrast administration. RADIATION DOSE REDUCTION: This exam was performed according to the departmental dose-optimization program which includes automated exposure control, adjustment of the mA and/or kV according to patient size and/or use of iterative reconstruction technique. CONTRAST:  65m OMNIPAQUE IOHEXOL 300 MG/ML  SOLN COMPARISON:  CT abdomen 10/19/2022 FINDINGS: Cardiovascular: Heart is normal size. Thoracic aorta is normal in caliber. Pulmonary arterial system is adequately opacified and demonstrates small embolus of the proximal right lower lobar pulmonary artery. No evidence of  right heart strain. Remaining vascular structures are normal. Mediastinum/Nodes: No mediastinal or hilar adenopathy. Remaining mediastinal structures are normal. Lungs/Pleura: Lungs are adequately inflated. No airspace consolidation or effusion. Airways are normal. Upper Abdomen: Mild to moderate ascites over the upper abdomen unchanged. Remainder of the upper abdomen is unchanged from the recent prior exam. Musculoskeletal: No focal abnormality. IMPRESSION: 1. Small embolus of the proximal right lower lobar pulmonary artery. No evidence of right heart strain. 2. Mild to moderate ascites over the upper abdomen unchanged from the recent prior exam. Critical Value/emergent results were called by telephone at the time of interpretation on 10/25/2022 at 7:10 p.m. to provider Nurse practitioner, ERufina Falco who verbally acknowledged these results. Electronically Signed   By: DMarin OlpM.D.   On: 10/25/2022 19:11    Scheduled Meds:  (feeding supplement) PROSource Plus  30 mL Oral BID BM   amLODipine  10 mg Oral Daily   feeding supplement  1 Container Oral TID BM   ferrous sulfate  325 mg Oral Q breakfast   folic acid  1 mg Oral Daily   furosemide  40 mg Oral Daily   multivitamin with minerals  1 tablet Oral Daily   ondansetron (ZOFRAN) IV  4 mg Intravenous Q8H   polyethylene glycol  17 g Oral BID   senna-docusate  2 tablet Oral QHS   spironolactone  25 mg Oral Daily   Continuous Infusions:  heparin 1,450 Units/hr (10/27/22 0513)     LOS: 7 days   AShelly Coss MD [facility] P2/06/2023, 11:00 AM

## 2022-10-28 NOTE — Progress Notes (Addendum)
PHARMACY - TOTAL PARENTERAL NUTRITION CONSULT NOTE   Indication:  gastric outlet obstruction with extrinsic compression   Patient Measurements: Height: 4' 11"$  (149.9 cm) Weight: 68.1 kg (150 lb 2.1 oz) IBW/kg (Calculated) : 43.2 TPN AdjBW (KG): 49.8 Body mass index is 30.32 kg/m. 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 Malignant ascites s/p paracentesis x 3. EGD: gastric outlet obstruction w/ extrinsic compression. Pharmacy consulted to manage TPN for nutrition support.   Glucose / Insulin: no hx DM, serum glu 148 Electrolytes:  Na low @ 129, others WNL Renal: WNL Hepatic: WNL Intake / Output; MIVF: no IVF GI Imaging:  GI Surgeries / Procedures:  2/9 EGD: gastric outlet obstruction w/ extrinsic compression  Central access: single lumen PAC placed 2/9 for chemo, PICC ordered for TPN on 2/11 TPN start date: 10/28/2022>> changed to 2/12  Nutritional Goals: Goal TPN rate is 65 mL/hr (provides ~ 86 g of protein and ~ 1607 kcals per day)  RD Assessment: Estimated Needs Total Energy Estimated Needs: 1500-1700 Total Protein Estimated Needs: 75-90g Total Fluid Estimated Needs: 1.5L/day  Current Nutrition:  NPO  Plan:  Start TPN at 42 mL/hr at 1800 Electrolytes in TPN: Na 149mq/L, K 543m/L, Ca 41m3mL, Mg 41mE42m, and Phos 141mm20m. Cl:Ac 1:1 Add standard MVI and trace elements to TPN Initiate Sensitive q6h SSI and adjust as needed  Thiamine 100 mg/day x 5 days D#1/5 Monitor TPN labs on Mon/Thurs  MicheEudelia Bunchrm.D Use secure chat for questions 10/28/2022 12:24 PM   Addendum: [facility]> will start 2/12 Dr AdikaLonn Georgia informed  MicheEudelia Bunchrm.D Use secure chat for questions 10/28/2022 1:18 PM

## 2022-10-29 ENCOUNTER — Inpatient Hospital Stay (HOSPITAL_COMMUNITY): Payer: Medicaid Other

## 2022-10-29 ENCOUNTER — Encounter (HOSPITAL_COMMUNITY): Payer: Self-pay | Admitting: Gastroenterology

## 2022-10-29 DIAGNOSIS — C786 Secondary malignant neoplasm of retroperitoneum and peritoneum: Secondary | ICD-10-CM | POA: Diagnosis not present

## 2022-10-29 LAB — CBC
HCT: 28.3 % — ABNORMAL LOW (ref 36.0–46.0)
Hemoglobin: 8.7 g/dL — ABNORMAL LOW (ref 12.0–15.0)
MCH: 22.8 pg — ABNORMAL LOW (ref 26.0–34.0)
MCHC: 30.7 g/dL (ref 30.0–36.0)
MCV: 74.3 fL — ABNORMAL LOW (ref 80.0–100.0)
Platelets: 564 10*3/uL — ABNORMAL HIGH (ref 150–400)
RBC: 3.81 MIL/uL — ABNORMAL LOW (ref 3.87–5.11)
RDW: 16.3 % — ABNORMAL HIGH (ref 11.5–15.5)
WBC: 9 10*3/uL (ref 4.0–10.5)
nRBC: 0 % (ref 0.0–0.2)

## 2022-10-29 LAB — COMPREHENSIVE METABOLIC PANEL
ALT: 14 U/L (ref 0–44)
AST: 11 U/L — ABNORMAL LOW (ref 15–41)
Albumin: 2.1 g/dL — ABNORMAL LOW (ref 3.5–5.0)
Alkaline Phosphatase: 88 U/L (ref 38–126)
Anion gap: 11 (ref 5–15)
BUN: 25 mg/dL — ABNORMAL HIGH (ref 6–20)
CO2: 26 mmol/L (ref 22–32)
Calcium: 8 mg/dL — ABNORMAL LOW (ref 8.9–10.3)
Chloride: 90 mmol/L — ABNORMAL LOW (ref 98–111)
Creatinine, Ser: 0.73 mg/dL (ref 0.44–1.00)
GFR, Estimated: >60 mL/min (ref >60)
Glucose, Bld: 138 mg/dL — ABNORMAL HIGH (ref 70–99)
Potassium: 4.5 mmol/L (ref 3.5–5.1)
Sodium: 127 mmol/L — ABNORMAL LOW (ref 135–145)
Total Bilirubin: 0.4 mg/dL (ref 0.3–1.2)
Total Protein: 5.7 g/dL — ABNORMAL LOW (ref 6.5–8.1)

## 2022-10-29 LAB — PHOSPHORUS: Phosphorus: 4.6 mg/dL (ref 2.5–4.6)

## 2022-10-29 LAB — GLUCOSE, CAPILLARY
Glucose-Capillary: 159 mg/dL — ABNORMAL HIGH (ref 70–99)
Glucose-Capillary: 149 mg/dL — ABNORMAL HIGH (ref 70–99)

## 2022-10-29 LAB — MAGNESIUM: Magnesium: 2.4 mg/dL (ref 1.7–2.4)

## 2022-10-29 LAB — HEPARIN LEVEL (UNFRACTIONATED): Heparin Unfractionated: 0.37 IU/mL (ref 0.30–0.70)

## 2022-10-29 LAB — TRIGLYCERIDES: Triglycerides: 134 mg/dL (ref <150)

## 2022-10-29 MED ORDER — SODIUM CHLORIDE 0.9% FLUSH
10.0000 mL | INTRAVENOUS | Status: DC | PRN
  Administered 2022-11-02: 10 mL

## 2022-10-29 MED ORDER — TRAVASOL 10 % IV SOLN
INTRAVENOUS | Status: AC
  Filled 2022-10-29: qty 554.4

## 2022-10-29 MED ORDER — SODIUM CHLORIDE 0.9% FLUSH
10.0000 mL | Freq: Two times a day (BID) | INTRAVENOUS | Status: DC
  Administered 2022-10-29 – 2022-11-04 (×10): 10 mL
  Administered 2022-11-05: 20 mL
  Administered 2022-11-05 – 2022-11-17 (×20): 10 mL

## 2022-10-29 NOTE — Progress Notes (Signed)
Nutrition Follow-up  INTERVENTION:   Recommend monitor magnesium, potassium, and phosphorus for at least 3 days, MD to replete as needed, as pt is at risk for refeeding syndrome.  -TPN management per Pharmacy -Recommend 100 mg Thiamine daily for 5 days. -Daily weights while on TPN  -Prosource Plus PO BID, each provides 100 kcals and 15g protein as tolerated  NUTRITION DIAGNOSIS:   Increased nutrient needs related to cancer and cancer related treatments as evidenced by estimated needs.  Ongoing.  GOAL:   Patient will meet greater than or equal to 90% of their needs  Not meeting currently.  MONITOR:   PO intake, Supplement acceptance, I & O's, Labs, Weight trends  REASON FOR ASSESSMENT:   Consult New TPN/TNA  ASSESSMENT:   47 y.o. female with medical history significant of anxiety, HPV comes to the hospital at [place] ED with complaints of abdominal distention and constipation. CT abdomen pelvis showed peritoneal carcinomatosis with extensive metastases, ascites and multiloculated cystic lesion of the pancreatic tail.  GYN oncology consulted, underwent paracentesis, 3.5 L was removed.  2/2: admitted 2/9: s/p EGD, s/p PAC placement  Patient having PICC placed today and starting TPN. TPN starting at 42 ml/hr, providing 1037 kcals and 55g protein.  Patient on full liquids, accepting some Prosource supplements for protein.  Has had BMs now, last this morning. GI concerned for ileus or SBO.  Admission weight: 153 lbs Current weight: 150 lbs  Medications: Ferrous sulfate, Folic acid, Lasix, Zofran, Senokot, Miralax  Labs reviewed: CBGs: 159 Low Na   Diet Order:   Diet Order             Diet full liquid Room service appropriate? Yes; Fluid consistency: Thin  Diet effective now                   EDUCATION NEEDS:   No education needs have been identified at this time  Skin:  Skin Assessment: Reviewed RN Assessment  Last BM:  2/12  Height:   Ht  Readings from Last 1 Encounters:  10/26/22 4' 11"$  (1.499 m)    Weight:   Wt Readings from Last 1 Encounters:  10/28/22 68.1 kg    BMI:  Body mass index is 30.32 kg/m.  Estimated Nutritional Needs:   Kcal:  1500-1700  Protein:  75-90g  Fluid:  1.5L/day  Clayton Bibles, MS, RD, LDN Inpatient Clinical Dietitian Contact information available via Amion

## 2022-10-29 NOTE — Progress Notes (Signed)
PHARMACY - TOTAL PARENTERAL NUTRITION CONSULT NOTE   Indication:  gastric outlet obstruction with extrinsic compression   Patient Measurements: Height: 4' 11"$  (149.9 cm) Weight: 68.1 kg (150 lb 2.1 oz) IBW/kg (Calculated) : 43.2 TPN AdjBW (KG): 49.8 Body mass index is 30.32 kg/m. 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 Malignant ascites s/p paracentesis x 3. EGD: gastric outlet obstruction w/ extrinsic compression. Pharmacy consulted to manage TPN for nutrition support.   Glucose / Insulin: no hx DM, serum glu 138 Electrolytes:  Na low @ 127, others WNL Renal: WNL Hepatic: WNL Triglycerides: WNL (2/12) Intake / Output; MIVF: no IVF; I/O not accurately recorded GI Imaging:  GI Surgeries / Procedures:  2/9 EGD: gastric outlet obstruction w/ extrinsic compression  Central access: single lumen PAC placed 2/9 for chemo, PICC ordered for TPN on 2/11 TPN start date: 10/28/2022>> changed to 2/12  Nutritional Goals: Goal TPN rate is 65 mL/hr (provides ~ 86 g of protein and ~ 1607 kcals per day)  RD Assessment: Estimated Needs Total Energy Estimated Needs: 1500-1700 Total Protein Estimated Needs: 75-90g Total Fluid Estimated Needs: 1.5L/day  Current Nutrition:  NPO  Significant Events: [facility]/11: [facility] today> will start 2/12 Dr Lonn Georgia & RN informed.  Plan:  Start TPN at 42 mL/hr at 1800 Electrolytes in TPN:  Na 144mq/L,  K 2829m/L,  Ca 29m629mL,  Mg 29mE67m,  Phos 10mm33m.  Max chloride Add standard MVI and trace elements to TPN Initiate Sensitive q6h SSI and adjust as needed  Thiamine 100 mg/day x 5 days D#1/5 Monitor TPN labs on Mon/Thurs   Milana Salay Peggyann JubarmD, BCPS Pharmacy: [phone] 7:01 AM

## 2022-10-29 NOTE — Progress Notes (Addendum)
PROGRESS NOTE  Christina [name]  [id number] DOB: 10/29/1975 DOA: 10/19/2022 PCP: [name], MD   Brief Narrative: Patient is a 47 year old female with history of anxiety,HPV, who presented with complaint of abdominal distention, constipation.  CT abdomen/pelvis showed peritoneal carcinomatosis with extensive metastasis, ascites, multiloculated cystic lesion of the pancreatic tail.  GYN oncology consulted initially .  Underwent paracentesis twice.  Tumor markers are elevated :CA125, CEA. . 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  Oncology, GI following.  S/p  EGD, plan for EUS/biopsy as per GI, starting on TPN. Marland Kitchen  Hospital course remarkable for persistent abdominal pain, distention, constipation, poor oral intake.  Palliative care also following for goals of care.  Assessment & Plan:  Principal Problem:   Peritoneal carcinomatosis ([place]) Active Problems:   S/P cesarean section   Generalized abdominal pain   Constipation   Ovarian mass   Pancreatic mass   Tobacco abuse   Malignant ascites   Protein-calorie malnutrition (HCC)   Abdominal pain secondary to peritoneal carcinomatosis/ascites: Concern of extensive metastatic disease with unknown primary as seen on the CT scan.  CT concerning for bilateral cystic/solid ovarian masses, pancreatic lesion also noted.  CEA, CA125 elevated, CA 19-9 is normal. Went to cancer center for pelvic examination on 2/7. 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  Medical oncology for palliative care also consulted regarding poor prognosis.  GI consulted, status post EGD on 2/9.  EGD was negative for malignancy in the esophagus, stomach or duodenum but was found to have significant extrinsic compression  in the gastric antrum.  GI planning for endoscopic ultrasound and biopsy . Oncology planning to start chemotherapy .   Underwent Port-A-Cath placement on 2/9  Ascites: Underwent paracentesis twice, first on 2/3 , second on 2/6, 3rd on 2/10.   Continue supportive care.  Started on spironolactone, Lasix.  Hyponatremia: This is most likely contributed by ascites.  Currently spironolactone, Lasix.  Continue to monitor BMP  Constipation/abdominal distention:Abdominal x-ray done on 2/9 did not show any bowel obstruction but shows possible ileus.  She has bowel sounds.  She is having bowel movements now. Continue aggressive bowel regimen  PE: Asymptomatic.  CT chest done with contrast for staging purpose showed small embolus on the proximal right lower lobe pulmonary artery.  Started on heparin drip.  Will change Eliquis when appropriate.  She is on room air  Microcytic anemia/iron deficiency/folic acid deficiency: Continue iron supplement, folic acid supplement.  With hemoglobin.  Currently in the range of 7-8  Moderate protein calorie malnutrition: Nutritionist consulted and following.  She continues to have poor oral intake.  Plan for TPN, needs to get PICC line  Hypokalemia/hypomagnesemia/hyponatremia: Currently being monitored and supplemented as needed  Hypertension: Continue amlodipine 10 mg , losartan .  Nutrition Problem: Increased nutrient needs Etiology: cancer and cancer related treatments    DVT prophylaxis:     Code Status: Full Code  Family Communication: Sister at the bedside on 2/9  Patient status: Inpatient  Patient is from : Home  Anticipated discharge to: Home  Estimated DC date: After full workup   Consultants: Oncology, GI, palliative care  Procedures: Paracentesis  Antimicrobials:  Anti-infectives (From admission, onward)    None       Subjective: Patient seen and examined at bedside today.  Hemodynamically stable.  She had a bowel movement this morning.  Passing gas.  Not in severe abdominal pain.  Objective: Vitals:   10/28/22 0550 10/28/22 1316 10/28/22 2105  10/29/22 0422  BP: (!) 137/92 (!) 120/95 126/81 124/81  Pulse: 100 99 (!) 102 99  Resp: 15 18 18 16  $ Temp: 98.2 F (36.8 C) (!) 97.5 F (36.4 C) 98 F (36.7 C) 97.8 F (36.6 C)  TempSrc: Oral Oral Oral Oral  SpO2: 96% 99% 98% 96%  Weight: 68.1 kg     Height:        Intake/Output Summary (Last 24 hours) at 10/29/2022 1158 Last data filed at 10/29/2022 0600 Gross per 24 hour  Intake 1017.06 ml  Output 0 ml  Net 1017.06 ml   Filed Weights   10/19/22 1156 10/26/22 1135 10/28/22 0550  Weight: 69.4 kg 69.4 kg 68.1 kg    Examination:    General exam: Overall comfortable, not in distress, very deconditioned, weak HEENT: PERRL Respiratory system:  no wheezes or crackles  Cardiovascular system: S1 & S2 heard, RRR.  Chemo-Port Gastrointestinal system: Abdomen is distended, bowel sounds present Central nervous system: Alert and oriented Extremities: No edema, no clubbing ,no cyanosis Skin: No rashes, no ulcers,no icterus     Data Reviewed: I have personally reviewed following labs and imaging studies  CBC: Recent Labs  Lab 10/25/22 2038 10/26/22 0427 10/27/22 0403 10/28/22 0103 10/29/22 0415  WBC 11.8* 10.7* 7.6 8.5 9.0  HGB 9.2* 8.8* 7.9* 8.7* 8.7*  HCT 29.6* 28.3* 25.7* 28.4* 28.3*  MCV 74.2* 74.1* 74.3* 74.3* 74.3*  PLT 558* 517* 416* 506* [id number]*   Basic Metabolic Panel: Recent Labs  Lab 10/23/22 0523 10/24/22 0613 10/26/22 0427 10/27/22 0403 10/28/22 0103 10/29/22 0415  NA 134* 131* 128* 129* 129* 127*  K 4.0 4.5 4.6 4.0 3.9 4.5  CL 95* 93* 90* 92* 87* 90*  CO2 29 27 28 27 29 26  $ GLUCOSE 150* 144* 140* 135* 148* 138*  BUN 12 14 22* 20 21* 25*  CREATININE 0.56 0.60 0.69 0.64 0.93 0.73  CALCIUM 8.8* 8.6* 8.4* 8.3* 8.2* 8.0*  MG 2.0 1.8  --   --   --  2.4  PHOS  --   --   --   --   --  4.6     Recent Results (from the past 240 hour(s))  Fungus Culture With Stain     Status: None (Preliminary result)   Collection Time: 10/20/22  9:45 AM   Specimen:  PATH Cytology Peritoneal fluid  Result Value Ref Range Status   Fungus Stain Final report  Final    Comment: (NOTE) Performed At: [facility] [id number] [place], [place] [id number] Rush Farmer MD [id number]    Fungus (Mycology) Culture PENDING  Incomplete   Fungal Source PERITONEAL  Final    Comment: Performed at [facility], [place] [address], [place], [place] [address]  Aerobic/Anaerobic Culture w Gram Stain (surgical/deep wound)     Status: None   Collection Time: 10/20/22  9:45 AM   Specimen: PATH Cytology Peritoneal fluid  Result Value Ref Range Status   Specimen Description   Final    PERITONEAL Performed at [place] [address], [place], [place] [address]    Special Requests   Final    NONE Performed at [facility], [place] [address] [place], [place] [address]    Gram Stain   Final    RARE WBC PRESENT, PREDOMINANTLY PMN NO ORGANISMS SEEN    Culture   Final    No growth aerobically  or anaerobically. Performed at [facility], [place] [address], [place], [place] [address]    Report Status 10/25/2022 FINAL  Final  Fungus Culture Result     Status: None   Collection Time: 10/20/22  9:45 AM  Result Value Ref Range Status   Result 1 Comment  Final    Comment: (NOTE) KOH/Calcofluor preparation:  no fungus observed. Performed At: [facility] [place], [place] [id number] Rush Farmer MD [id number]      Radiology Studies: CT ABDOMEN PELVIS W CONTRAST  Result Date: 10/28/2022 CLINICAL DATA:  Abdominal pain EXAM: CT ABDOMEN AND PELVIS WITH CONTRAST TECHNIQUE: Multidetector CT imaging of the abdomen and pelvis was performed using the standard protocol following bolus administration of intravenous contrast. RADIATION DOSE REDUCTION: This exam was performed according to the departmental dose-optimization program which includes automated exposure control, adjustment of  the mA and/or kV according to patient size and/or use of iterative reconstruction technique. CONTRAST:  114m OMNIPAQUE IOHEXOL 300 MG/ML  SOLN COMPARISON:  CT 10/19/2022 FINDINGS: Lower chest: New small left pleural effusion. Hepatobiliary: Geographic hepatic steatosis. No definite liver lesion. Unremarkable gallbladder. No hyperdense gallstone. No biliary dilatation. Pancreas: Multiloculated cystic lesion of the pancreatic tail measuring approximately 7.9 x 4.5 x 4.0 cm. No ductal dilatation is seen. Spleen: Normal in size without focal abnormality. Adrenals/Urinary Tract: Unremarkable adrenal glands. Nonobstructing stones within the left kidney. Kidneys enhance symmetrically. No renal lesion. No hydronephrosis. Urinary bladder unremarkable. Stomach/Bowel: Abnormal wall thickening of the gastric antrum (series 4, image 37). Multiple dilated loops of small bowel have developed, measuring up to 4.1 cm in diameter. No definite transition point. Numerous thickened, hyperenhancing loops of distal small bowel. Moderate volume of stool within the colon. Vascular/Lymphatic: Minimal aortic atherosclerosis. No aneurysm. No lymphadenopathy is seen. Reproductive: Cystic-appearing lesion of the anterior uterine body measuring 3.4 cm (series 2, image 72) large bilateral multi-cystic adnexal masses measuring approximately 11.2 x 6.9 x 10.0 cm on the right and 10.9 x 8.2 x 10.8 cm on the left. Other: Small-moderate volume ascites, decreased in volume since prior. No organized or rim enhancing collections. No pneumoperitoneum. Similar appearance of peritoneal and omental nodularity compatible with carcinomatosis. Musculoskeletal: No acute or significant osseous findings. Mild anasarca. IMPRESSION: 1. Multiple dilated loops of small bowel have developed which may represent ileus versus partial or developing small bowel obstruction. No definite transition point is identified. 2. Numerous thickened, hyperenhancing loops of distal  small bowel, suggestive of an infectious or inflammatory enteritis. 3. Abnormal wall thickening of the gastric antrum, which could be secondary to gastritis or malignancy. Follow-up findings from recent endoscopy. 4. Large bilateral multi-cystic adnexal masses measuring up to 11.2 cm on the right and 10.8 cm on the left, concerning for primary or metastatic ovarian malignancy. 5. Multiloculated cystic lesion of the pancreatic tail measuring up to 7.9 cm. 6. Small-moderate volume ascites, decreased in volume since prior. Similar appearance of peritoneal and omental nodularity compatible with carcinomatosis. 7. New small left pleural effusion. 8. Geographic hepatic steatosis. 9. Nonobstructing left renal stones. Electronically Signed   By: NDavina PokeD.O.   On: 10/28/2022 16:36   [place]KG SITE RITE  Result Date: 10/28/2022 If Site Rite image not attached, placement could not be confirmed due to current cardiac rhythm.  [place]oreaParacentesis  Result Date: 10/27/2022 INDICATION: Abdominal distention. Recurrent ascites secondary to peritoneal carcinomatosis. Request for diagnostic and therapeutic paracentesis EXAM: ULTRASOUND GUIDED RIGHT LOWER QUADRANT PARACENTESIS MEDICATIONS: 1% plain lidocaine, 5 mL COMPLICATIONS:  None immediate. PROCEDURE: Informed written consent was obtained from the patient after a discussion of the risks, benefits and alternatives to treatment. A timeout was performed prior to the initiation of the procedure. Initial ultrasound scanning demonstrates a small to moderate amount of ascites within the right lower abdominal quadrant. The right lower abdomen was prepped and draped in the usual sterile fashion. 1% lidocaine was used for local anesthesia. Following this, a 19 gauge, 7-cm, Yueh catheter was introduced. An ultrasound image was saved for documentation purposes. The paracentesis was performed. The catheter was removed and a dressing was applied. The patient tolerated the procedure  well without immediate post procedural complication. FINDINGS: A total of approximately 1.9 L of clear yellow fluid was removed. Samples were sent to the laboratory as requested by the clinical team. IMPRESSION: Successful ultrasound-guided paracentesis yielding 1.9 liters of peritoneal fluid. Read by: Ascencion Dike PA-C Electronically Signed   By: Jerilynn Mages.  Shick M.D.   On: 10/27/2022 16:26    Scheduled Meds:  (feeding supplement) PROSource Plus  30 mL Oral BID BM   amLODipine  10 mg Oral Daily   Chlorhexidine Gluconate Cloth  6 each Topical Daily   feeding supplement  1 Container Oral TID BM   ferrous sulfate  325 mg Oral Q breakfast   folic acid  1 mg Oral Daily   furosemide  40 mg Oral Daily   insulin aspart  0-9 Units Subcutaneous Q6H   losartan  50 mg Oral Daily   ondansetron (ZOFRAN) IV  4 mg Intravenous Q8H   polyethylene glycol  17 g Oral BID   senna-docusate  2 tablet Oral QHS   spironolactone  25 mg Oral Daily   Continuous Infusions:  heparin 1,750 Units/hr (10/28/22 2136)   TPN ADULT (ION)       LOS: 9 days   Shelly Coss, MD [facility] P2/08/2023, 11:58 AM

## 2022-10-29 NOTE — Progress Notes (Signed)
Peripherally Inserted Central Catheter Placement  The IV Nurse has discussed with the patient and/or persons authorized to consent for the patient, the purpose of this procedure and the potential benefits and risks involved with this procedure.  The benefits include less needle sticks, lab draws from the catheter, and the patient may be discharged home with the catheter. Risks include, but not limited to, infection, bleeding, blood clot (thrombus formation), and puncture of an artery; nerve damage and irregular heartbeat and possibility to perform a PICC exchange if needed/ordered by physician.  Alternatives to this procedure were also discussed.  Bard Power PICC patient education guide, fact sheet on infection prevention and patient information card has been provided to patient /or left at bedside.    PICC Placement Documentation  PICC Double Lumen [id number] Right Basilic 32 cm 0 cm (Active)  Indication for Insertion or Continuance of Line Administration of hyperosmolar/irritating solutions (i.e. TPN, Vancomycin, etc.) 10/29/22 1251  Exposed Catheter (cm) 0 cm 10/29/22 1251  Site Assessment Clean, Dry, Intact 10/29/22 1251  Lumen #1 Status Flushed;Saline locked;Blood return noted 10/29/22 1251  Lumen #2 Status Flushed;Saline locked;Blood return noted 10/29/22 1251  Dressing Type Transparent;Securing device 10/29/22 1251  Dressing Status Antimicrobial disc in place;Clean, Dry, Intact 10/29/22 1251  Safety Lock Not Applicable [id number]  Line Adjustment (NICU/IV Team Only) No 10/29/22 1251  Dressing Intervention New dressing;Other (Comment) 10/29/22 1251  Dressing Change Due 11/05/22 10/29/22 1251       Enos Fling 10/29/2022, 12:52 PM

## 2022-10-29 NOTE — Progress Notes (Signed)
Daily Progress Note   Patient Name: Christina [name]       Date: 10/29/2022 DOB: 04-17-1976  Age: 47 y.o. MRN#: [id number] Attending Physician: Shelly Coss, MD Primary Care Physician: [name], MD Admit Date: 10/19/2022  Reason for Consultation/Follow-up: Establishing goals of care  Subjective: Awake alert, had a bowel movement this morning, abdominal pain is better, wants to eat.     Length of Stay: 9  Current Medications: Scheduled Meds:   (feeding supplement) PROSource Plus  30 mL Oral BID BM   amLODipine  10 mg Oral Daily   Chlorhexidine Gluconate Cloth  6 each Topical Daily   feeding supplement  1 Container Oral TID BM   ferrous sulfate  325 mg Oral Q breakfast   folic acid  1 mg Oral Daily   furosemide  40 mg Oral Daily   insulin aspart  0-9 Units Subcutaneous Q6H   losartan  50 mg Oral Daily   ondansetron (ZOFRAN) IV  4 mg Intravenous Q8H   polyethylene glycol  17 g Oral BID   senna-docusate  2 tablet Oral QHS   spironolactone  25 mg Oral Daily    Continuous Infusions:  heparin 1,750 Units/hr (10/28/22 2136)   TPN ADULT (ION)      PRN Meds: acetaminophen, alum & mag hydroxide-simeth, cyclobenzaprine, diphenhydrAMINE-zinc acetate, guaiFENesin, hydrALAZINE, HYDROmorphone (DILAUDID) injection, ipratropium-albuterol, metoprolol tartrate, naLOXone (NARCAN)  injection, ondansetron (ZOFRAN) IV, oxyCODONE, traZODone  Physical Exam         Awake alert moderate distress Regular work of breathing S 1 S 2 Improved abdominal   discomfort Trace edema Awake alert oriented.   Vital Signs: BP 124/81 (BP Location: Right Arm)   Pulse 99   Temp 97.8 F (36.6 C) (Oral)   Resp 16   Ht 4' 11"$  (1.499 m)   Wt 68.1 kg   LMP 10/12/2022   SpO2 96%   BMI 30.32 kg/m  SpO2:  SpO2: 96 % O2 Device: O2 Device: Room Air O2 Flow Rate: O2 Flow Rate (L/min): 2 L/min  Intake/output summary:  Intake/Output Summary (Last 24 hours) at 10/29/2022 1206 Last data filed at 10/29/2022 0600 Gross per 24 hour  Intake 1017.06 ml  Output 0 ml  Net 1017.06 ml    LBM: Last BM Date : 10/27/22 Baseline Weight: Weight: 69.4 kg  Most recent weight: Weight: 68.1 kg       Palliative Assessment/Data:      Patient Active Problem List   Diagnosis Date Noted   Generalized abdominal pain 10/22/2022   Constipation 10/22/2022   Ovarian mass 10/22/2022   Pancreatic mass 10/22/2022   Tobacco abuse 10/22/2022   Malignant ascites 10/22/2022   Protein-calorie malnutrition ([place]) 10/22/2022   Peritoneal carcinomatosis ([place]) 10/19/2022   Postop check 06/25/2016   Encounter for sterilization 06/12/2016   Well woman exam with routine gynecological exam 04/30/2016   Encounter for contraceptive management 04/30/2016   S/P cesarean section 07/14/2014    Palliative Care Assessment & Plan   Patient Profile:    Assessment:  EGD and port placed on 10-26-22.  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 Malignant ascites status post paracentesis twice Recurrent N/V and constipation, secondary to #1, partial bowel obstruction due to the extensive peritoneal metastasis Moderate protein and calorie malnutrition Small PE  Recommendations/Plan: Discussed with patient about current pain and non pain symptom management:   IV Dilaudid dose PRN, d/c Norco and start PO Oxy IR PRN. GI following. CT abdomen from earlier in this hospitalization noted. Scheduled Zofran and PRN. Monitor symptoms.   Full code full scope Patient is aware of EGD findings:  negative for malignancy in the esophagus, stomach or duodenum.  However significant extrinsic compression was noticed in the gastric antrum. Patient understands that she might undergo EUS  and biopsy next week.  Recent CT abdomen also noted. Patient to undergo abdominal X ray. Will consider Octreotide infusion of going on to develop malignant SBO. PMT will continue to follow.      Goals of Care and Additional Recommendations: Limitations on Scope of Treatment: Full Scope Treatment  Code Status:    Code Status Orders  (From admission, onward)           Start     Ordered   10/20/22 1405  Full code  Continuous       Question:  By:  Answer:  Consent: discussion documented in EHR   10/20/22 1404           Code Status History     Date Active Date Inactive Code Status Order ID Comments User Context   05/23/2015 0704 05/27/2015 1545 Full Code [id number]  Cheri Fowler, MD Inpatient   07/14/2014 1022 07/16/2014 1623 Full Code [id number]  Cheri Fowler, MD Inpatient       Prognosis:  Unable to determine  Discharge Planning: To Be Determined  Care plan was discussed with patient.     Thank you for allowing the Palliative Medicine Team to assist in the care of this patient.  mod MDM     Greater than 50%  of this time was spent counseling and coordinating care related to the above assessment and plan.  Loistine Chance, MD  Please contact Palliative Medicine Team phone at [phone] for questions and concerns.

## 2022-10-29 NOTE — Progress Notes (Addendum)
Christina [name]   DOB:07-16-1976   [id number]   [id number]  Med/onc follow up note  Subjective: Patient has been feeling bloated, underwent paracentesis again over the weekend was 1.9 L fluid removed.  Her CT scan yesterday and abdominal x-ray today showed multiple dilated loops of small bowel, likely ileus.  She did have a bowel movement this morning.  Objective:  Vitals:   10/29/22 0422 10/29/22 1400  BP: 124/81 137/89  Pulse: 99 99  Resp: 16 20  Temp: 97.8 F (36.6 C) 98 F (36.7 C)  SpO2: 96% 100%    Body mass index is 30.32 kg/m.  Intake/Output Summary (Last 24 hours) at 10/29/2022 2105 Last data filed at 10/29/2022 1800 Gross per 24 hour  Intake 2078.04 ml  Output 0 ml  Net 2078.04 ml     Sclerae unicteric  Oropharynx clear  No peripheral adenopathy  Lungs clear -- no rales or rhonchi  Heart regular rate and rhythm  Abdomen distended with mild diffuse tenderness.  MSK no focal spinal tenderness, no peripheral edema  Neuro nonfocal    CBG (last 3)  Recent Labs    10/29/22 1147 10/29/22 1730  GLUCAP 159* 149*     Labs:   Urine Studies No results for input(s): "UHGB", "CRYS" in the last 72 hours.  Invalid input(s): "UACOL", "UAPR", "USPG", "UPH", "UTP", "UGL", "UKET", "UBIL", "UNIT", "UROB", "ULEU", "UEPI", "UWBC", "URBC", "UBAC", "CAST", "UCOM", "BILUA"  Basic Metabolic Panel: Recent Labs  Lab 10/23/22 0523 10/24/22 0613 10/26/22 0427 10/27/22 0403 10/28/22 0103 10/29/22 0415  NA 134* 131* 128* 129* 129* 127*  K 4.0 4.5 4.6 4.0 3.9 4.5  CL 95* 93* 90* 92* 87* 90*  CO2 29 27 28 27 29 26  $ GLUCOSE 150* 144* 140* 135* 148* 138*  BUN 12 14 22* 20 21* 25*  CREATININE 0.56 0.60 0.69 0.64 0.93 0.73  CALCIUM 8.8* 8.6* 8.4* 8.3* 8.2* 8.0*  MG 2.0 1.8  --   --   --  2.4  PHOS  --   --   --   --   --  4.6   GFR Estimated Creatinine Clearance: 72.2 mL/min (by C-G formula based on SCr of 0.73 mg/dL). Liver Function Tests: Recent Labs  Lab  10/23/22 0523 10/24/22 0613 10/29/22 0415  [name] 14* 14* 11*  ALT 18 16 14  $ ALKPHOS 78 82 88  BILITOT 0.2* 0.3 0.4  PROT 6.0* 5.7* 5.7*  ALBUMIN 2.2* 2.1* 2.1*   No results for input(s): "LIPASE", "AMYLASE" in the last 168 hours. No results for input(s): "AMMONIA" in the last 168 hours. Coagulation profile No results for input(s): "INR", "PROTIME" in the last 168 hours.   CBC: Recent Labs  Lab 10/25/22 2038 10/26/22 0427 10/27/22 0403 10/28/22 0103 10/29/22 0415  WBC 11.8* 10.7* 7.6 8.5 9.0  HGB 9.2* 8.8* 7.9* 8.7* 8.7*  HCT 29.6* 28.3* 25.7* 28.4* 28.3*  MCV 74.2* 74.1* 74.3* 74.3* 74.3*  PLT 558* 517* 416* 506* 564*   Cardiac Enzymes: No results for input(s): "CKTOTAL", "CKMB", "CKMBINDEX", "TROPONINI" in the last 168 hours. BNP: Invalid input(s): "POCBNP" CBG: Recent Labs  Lab 10/29/22 1147 10/29/22 1730  GLUCAP 159* 149*   D-Dimer No results for input(s): "DDIMER" in the last 72 hours. Hgb A1c No results for input(s): "HGBA1C" in the last 72 hours. Lipid Profile Recent Labs    10/29/22 0415  TRIG 134   Thyroid function studies No results for input(s): "TSH", "T4TOTAL", "T3FREE", "THYROIDAB" in the last 72 hours.  Invalid input(s): "FREET3" Anemia work up No results for input(s): "VITAMINB12", "FOLATE", "FERRITIN", "TIBC", "IRON", "RETICCTPCT" in the last 72 hours. Microbiology Recent Results (from the past 240 hour(s))  Fungus Culture With Stain     Status: None (Preliminary result)   Collection Time: 10/20/22  9:45 AM   Specimen: PATH Cytology Peritoneal fluid  Result Value Ref Range Status   Fungus Stain Final report  Final    Comment: (NOTE) Performed At: [facility] [id number] [place], [place] [id number] Rush Farmer MD [id number]    Fungus (Mycology) Culture PENDING  Incomplete   Fungal Source PERITONEAL  Final    Comment: Performed at [facility], [place] [address] [place], [place] [address]   Aerobic/Anaerobic Culture w Gram Stain (surgical/deep wound)     Status: None   Collection Time: 10/20/22  9:45 AM   Specimen: PATH Cytology Peritoneal fluid  Result Value Ref Range Status   Specimen Description   Final    PERITONEAL Performed at [place] [address], [place], [place] [address]    Special Requests   Final    NONE Performed at [facility], [place] [address] [place], [place] [address]    Gram Stain   Final    RARE WBC PRESENT, PREDOMINANTLY PMN NO ORGANISMS SEEN    Culture   Final    No growth aerobically or anaerobically. Performed at [facility], [place] [address] [place], [place] [address]    Report Status 10/25/2022 FINAL  Final  Fungus Culture Result     Status: None   Collection Time: 10/20/22  9:45 AM  Result Value Ref Range Status   Result 1 Comment  Final    Comment: (NOTE) KOH/Calcofluor preparation:  no fungus observed. Performed At: [facility] [place], [place] [id number] Rush Farmer MD [id number]       Studies:  DG Abd 2 Views  Result Date: 10/29/2022 CLINICAL DATA:  Distended abdomen EXAM: ABDOMEN - 2 VIEW COMPARISON:  10/28/2022 FINDINGS: No free air beneath the diaphragm. Persistent moderate gas distension of large and small bowel, small bowel loops distended up to 4 cm. Findings do not appear significantly changed compared to radiograph several days prior. IMPRESSION: Persistent moderate gas distension of large and small bowel, not significantly changed compared to prior and suggestive of ileus versus partial obstruction Electronically Signed   By: Donavan Foil M.D.   On: 10/29/2022 16:20   CT ABDOMEN PELVIS W CONTRAST  Result Date: 10/28/2022 CLINICAL DATA:  Abdominal pain EXAM: CT ABDOMEN AND PELVIS WITH CONTRAST TECHNIQUE: Multidetector CT imaging of the abdomen and pelvis was performed using the standard protocol following bolus administration of intravenous  contrast. RADIATION DOSE REDUCTION: This exam was performed according to the departmental dose-optimization program which includes automated exposure control, adjustment of the mA and/or kV according to patient size and/or use of iterative reconstruction technique. CONTRAST:  175m OMNIPAQUE IOHEXOL 300 MG/ML  SOLN COMPARISON:  CT 10/19/2022 FINDINGS: Lower chest: New small left pleural effusion. Hepatobiliary: Geographic hepatic steatosis. No definite liver lesion. Unremarkable gallbladder. No hyperdense gallstone. No biliary dilatation. Pancreas: Multiloculated cystic lesion of the pancreatic tail measuring approximately 7.9 x 4.5 x 4.0 cm. No ductal dilatation is seen. Spleen: Normal in size without focal abnormality. Adrenals/Urinary Tract: Unremarkable adrenal glands. Nonobstructing stones within the left kidney. Kidneys enhance symmetrically. No renal lesion. No hydronephrosis. Urinary bladder unremarkable. Stomach/Bowel: Abnormal wall thickening of the gastric antrum (series 4,  image 37). Multiple dilated loops of small bowel have developed, measuring up to 4.1 cm in diameter. No definite transition point. Numerous thickened, hyperenhancing loops of distal small bowel. Moderate volume of stool within the colon. Vascular/Lymphatic: Minimal aortic atherosclerosis. No aneurysm. No lymphadenopathy is seen. Reproductive: Cystic-appearing lesion of the anterior uterine body measuring 3.4 cm (series 2, image 72) large bilateral multi-cystic adnexal masses measuring approximately 11.2 x 6.9 x 10.0 cm on the right and 10.9 x 8.2 x 10.8 cm on the left. Other: Small-moderate volume ascites, decreased in volume since prior. No organized or rim enhancing collections. No pneumoperitoneum. Similar appearance of peritoneal and omental nodularity compatible with carcinomatosis. Musculoskeletal: No acute or significant osseous findings. Mild anasarca. IMPRESSION: 1. Multiple dilated loops of small bowel have developed which  may represent ileus versus partial or developing small bowel obstruction. No definite transition point is identified. 2. Numerous thickened, hyperenhancing loops of distal small bowel, suggestive of an infectious or inflammatory enteritis. 3. Abnormal wall thickening of the gastric antrum, which could be secondary to gastritis or malignancy. Follow-up findings from recent endoscopy. 4. Large bilateral multi-cystic adnexal masses measuring up to 11.2 cm on the right and 10.8 cm on the left, concerning for primary or metastatic ovarian malignancy. 5. Multiloculated cystic lesion of the pancreatic tail measuring up to 7.9 cm. 6. Small-moderate volume ascites, decreased in volume since prior. Similar appearance of peritoneal and omental nodularity compatible with carcinomatosis. 7. New small left pleural effusion. 8. Geographic hepatic steatosis. 9. Nonobstructing left renal stones. Electronically Signed   By: Davina Poke D.O.   On: 10/28/2022 16:36   [place] EKG SITE RITE  Result Date: 10/28/2022 If Site Rite image not attached, placement could not be confirmed due to current cardiac rhythm.   Assessment: 47 y.o. 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 Malignant ascites status post paracentesisX3 Recurrent N/V and constipation, secondary to #1, partial bowel obstruction due to the extensive peritoneal metastasis Moderate protein and calorie malnutrition Small PE    Plan:  -agree with TPN, she is not able to get enteral nutrition  -GI Dr. Paulita Fujita saw her this morning, feels EUS biopsy is difficult and risky due to the ileus and dilated small bowel -I recommend n.p.o., and try NG tube with intermittent suction, to see if her ileus will improve. -This is likely related to her extensive peritoneal carcinomatosis.  I plan to start chemotherapy in the next 2 to 3 days.  Ideally I would like to  have EUS biopsy of her pancreatic lesion, to confirm the primary tumor.  However if EUS is not feasible in the next few days, I do plan to start her on chemotherapy FOLFIRINOX without EUS biopsy.  I plan to inform the inpatient chemo team. --Chemotherapy consent: Side effects including but does not not limited to, fatigue, nausea, vomiting, diarrhea, hair loss, cold sensitivity and neuropathy, fluid retention, renal and kidney dysfunction, neutropenic fever, needed for blood transfusion, bleeding, were discussed with patient in great detail. She agrees to proceed. -the goal of chem is palliative to prolong her life and improve her quality of life  -I communicated with Drs. Outlaw and Adhikari today.  -I will f/u   I spent a total of 50 mins for her care today, including care coordination and chemo orders etc.   Truitt Merle, MD 10/29/2022

## 2022-10-29 NOTE — Progress Notes (Signed)
[place] for IV heparin Indication: pulmonary embolus  Allergies  Allergen Reactions   Claritin [Loratadine] Other (See Comments)    causes spasms   Diphenhydramine Other (See Comments)    Causes spasms and keeps alert for days   Lorazepam Other (See Comments)    After a few doses it made her feel paranoid    Patient Measurements: Height: 4' 11"$  (149.9 cm) Weight: 68.1 kg (150 lb 2.1 oz) IBW/kg (Calculated) : 43.2 Heparin Dosing Weight: 59 kg  Vital Signs: Temp: 97.8 F (36.6 C) (02/12 0422) Temp Source: Oral (02/12 0422) BP: 124/81 (02/12 0422) Pulse Rate: 99 (02/12 0422)  Labs: Recent Labs    10/27/22 0403 10/27/22 1154 10/28/22 0103 10/28/22 0805 10/29/22 0415  HGB 7.9*  --  8.7*  --  8.7*  HCT 25.7*  --  28.4*  --  28.3*  PLT 416*  --  506*  --  564*  HEPARINUNFRC 0.13*   < > 0.31 0.47 0.37  CREATININE 0.64  --  0.93  --  0.73   < > = values in this interval not displayed.     Estimated Creatinine Clearance: 72.2 mL/min (by C-G formula based on SCr of 0.73 mg/dL).  Assessment: 10 yoF admitted on 2/2 with new finding of metastatic peritoneal carcinomatosis/ascites. CT chest on 2/8 showed incidental finding of small PE. Pharmacy is consulted to dose IV heparin.  No prior to admission anticoagulation Lovenox 59m SQ daily ordered for inpatient VTE prophylaxis (last dose on 2/8 at 1203) Baseline PT/INR WNL on 2/3  Today, 10/29/2022: HL 0.37 therapeutic on 1750 units/hr Hgb 8.7, plts high No bleeding documented  Goal of Therapy:  Heparin level 0.3-0.7 units/ml Monitor platelets by anticoagulation protocol: Yes   Plan:  Continue  heparin drip @ 1750 units/hr  Daily CBC & daily heparin level For EUS/biopsy this week F/u to start Eliquis when appropriate   EPeggyann Juba PharmD, BCPS Pharmacy: [phone]/08/2023 6:57 AM

## 2022-10-29 NOTE — Progress Notes (Signed)
Subjective: Distended and tight abdomen  Objective: Vital signs in last 24 hours: Temp:  [97.5 F (36.4 C)-98 F (36.7 C)] 97.8 F (36.6 C) (02/12 0422) Pulse Rate:  [99-102] 99 (02/12 0422) Resp:  [16-18] 16 (02/12 0422) BP: (120-126)/(81-95) 124/81 (02/12 0422) SpO2:  [96 %-99 %] 96 % (02/12 0422) Weight change:  Last BM Date : 10/27/22  PE: GEN:  NAD ABD:  Moderately distended abdomen, no significant tenderness, has predominance of tympany  Lab Results: CBC    Component Value Date/Time   WBC 9.0 10/29/2022 0415   RBC 3.81 (L) 10/29/2022 0415   HGB 8.7 (L) 10/29/2022 0415   HCT 28.3 (L) 10/29/2022 0415   PLT 564 (H) 10/29/2022 0415   MCV 74.3 (L) 10/29/2022 0415   MCH 22.8 (L) 10/29/2022 0415   MCHC 30.7 10/29/2022 0415   RDW 16.3 (H) 10/29/2022 0415   LYMPHSABS 1.9 04/11/2016 1310   MONOABS 0.3 04/11/2016 1310   EOSABS 0.2 04/11/2016 1310   BASOSABS 0.0 04/11/2016 1310  CMP     Component Value Date/Time   NA 127 (L) 10/29/2022 0415   K 4.5 10/29/2022 0415   CL 90 (L) 10/29/2022 0415   CO2 26 10/29/2022 0415   GLUCOSE 138 (H) 10/29/2022 0415   BUN 25 (H) 10/29/2022 0415   CREATININE 0.73 10/29/2022 0415   CALCIUM 8.0 (L) 10/29/2022 0415   PROT 5.7 (L) 10/29/2022 0415   ALBUMIN 2.1 (L) 10/29/2022 0415   AST 11 (L) 10/29/2022 0415   ALT 14 10/29/2022 0415   ALKPHOS 88 10/29/2022 0415   BILITOT 0.4 10/29/2022 0415   GFRNONAA >60 10/29/2022 0415   GFRAA >60 12/03/2016 1142   Assessment:   Peritoneal carcinomatosis with ascites, unclear primary possible pancreas. Abdominal distention, despite recent paracentesis.  Plan:   I am worried patient is having progressive ileus or small bowel obstruction. Ordering abdominal xray for evaluation. Need patient's significant abdominal distention (especially if from ileus or bowel obstruction) improved prior to consideration of EUS. [facility] will follow.   Landry Dyke 10/29/2022, 12:00 PM   Cell  [phone] If no answer or after 5 PM call [phone]

## 2022-10-30 ENCOUNTER — Inpatient Hospital Stay (HOSPITAL_COMMUNITY): Payer: Medicaid Other

## 2022-10-30 ENCOUNTER — Other Ambulatory Visit: Payer: Self-pay

## 2022-10-30 DIAGNOSIS — R112 Nausea with vomiting, unspecified: Secondary | ICD-10-CM

## 2022-10-30 DIAGNOSIS — Z79899 Other long term (current) drug therapy: Secondary | ICD-10-CM

## 2022-10-30 DIAGNOSIS — R180 Malignant ascites: Secondary | ICD-10-CM | POA: Diagnosis not present

## 2022-10-30 DIAGNOSIS — C786 Secondary malignant neoplasm of retroperitoneum and peritoneum: Secondary | ICD-10-CM | POA: Diagnosis not present

## 2022-10-30 DIAGNOSIS — N838 Other noninflammatory disorders of ovary, fallopian tube and broad ligament: Secondary | ICD-10-CM

## 2022-10-30 DIAGNOSIS — I2699 Other pulmonary embolism without acute cor pulmonale: Secondary | ICD-10-CM

## 2022-10-30 DIAGNOSIS — E440 Moderate protein-calorie malnutrition: Secondary | ICD-10-CM

## 2022-10-30 DIAGNOSIS — Z515 Encounter for palliative care: Secondary | ICD-10-CM

## 2022-10-30 DIAGNOSIS — K56600 Partial intestinal obstruction, unspecified as to cause: Secondary | ICD-10-CM

## 2022-10-30 DIAGNOSIS — K8689 Other specified diseases of pancreas: Secondary | ICD-10-CM

## 2022-10-30 DIAGNOSIS — G893 Neoplasm related pain (acute) (chronic): Secondary | ICD-10-CM

## 2022-10-30 DIAGNOSIS — K5900 Constipation, unspecified: Secondary | ICD-10-CM

## 2022-10-30 LAB — MAGNESIUM: Magnesium: 2.4 mg/dL (ref 1.7–2.4)

## 2022-10-30 LAB — HEPARIN LEVEL (UNFRACTIONATED)
Heparin Unfractionated: 0.27 IU/mL — ABNORMAL LOW (ref 0.30–0.70)
Heparin Unfractionated: 0.46 IU/mL (ref 0.30–0.70)
Heparin Unfractionated: 0.44 IU/mL (ref 0.30–0.70)

## 2022-10-30 LAB — COMPREHENSIVE METABOLIC PANEL
ALT: 14 U/L (ref 0–44)
AST: 8 U/L — ABNORMAL LOW (ref 15–41)
Albumin: 2.1 g/dL — ABNORMAL LOW (ref 3.5–5.0)
Alkaline Phosphatase: 88 U/L (ref 38–126)
Anion gap: 11 (ref 5–15)
BUN: 25 mg/dL — ABNORMAL HIGH (ref 6–20)
CO2: 26 mmol/L (ref 22–32)
Calcium: 7.8 mg/dL — ABNORMAL LOW (ref 8.9–10.3)
Chloride: 90 mmol/L — ABNORMAL LOW (ref 98–111)
Creatinine, Ser: 0.64 mg/dL (ref 0.44–1.00)
GFR, Estimated: >60 mL/min (ref >60)
Glucose, Bld: 174 mg/dL — ABNORMAL HIGH (ref 70–99)
Potassium: 4.1 mmol/L (ref 3.5–5.1)
Sodium: 127 mmol/L — ABNORMAL LOW (ref 135–145)
Total Bilirubin: 0.4 mg/dL (ref 0.3–1.2)
Total Protein: 5.6 g/dL — ABNORMAL LOW (ref 6.5–8.1)

## 2022-10-30 LAB — CBC
HCT: 26.1 % — ABNORMAL LOW (ref 36.0–46.0)
Hemoglobin: 8 g/dL — ABNORMAL LOW (ref 12.0–15.0)
MCH: 22.7 pg — ABNORMAL LOW (ref 26.0–34.0)
MCHC: 30.7 g/dL (ref 30.0–36.0)
MCV: 74.1 fL — ABNORMAL LOW (ref 80.0–100.0)
Platelets: 476 10*3/uL — ABNORMAL HIGH (ref 150–400)
RBC: 3.52 MIL/uL — ABNORMAL LOW (ref 3.87–5.11)
RDW: 16.3 % — ABNORMAL HIGH (ref 11.5–15.5)
WBC: 9.6 10*3/uL (ref 4.0–10.5)
nRBC: 0 % (ref 0.0–0.2)

## 2022-10-30 LAB — GLUCOSE, CAPILLARY
Glucose-Capillary: 151 mg/dL — ABNORMAL HIGH (ref 70–99)
Glucose-Capillary: 166 mg/dL — ABNORMAL HIGH (ref 70–99)
Glucose-Capillary: 169 mg/dL — ABNORMAL HIGH (ref 70–99)
Glucose-Capillary: 177 mg/dL — ABNORMAL HIGH (ref 70–99)
Glucose-Capillary: 185 mg/dL — ABNORMAL HIGH (ref 70–99)

## 2022-10-30 LAB — PHOSPHORUS: Phosphorus: 3.5 mg/dL (ref 2.5–4.6)

## 2022-10-30 MED ORDER — DEXTROSE 5 % IV SOLN: Freq: Once | INTRAVENOUS | Status: AC

## 2022-10-30 MED ORDER — TRAVASOL 10 % IV SOLN
INTRAVENOUS | Status: AC
  Filled 2022-10-30: qty 726

## 2022-10-30 MED ORDER — OXALIPLATIN CHEMO INJECTION 100 MG/20ML: 70.0000 mg/m2 | Freq: Once | INTRAVENOUS | Status: DC

## 2022-10-30 MED ORDER — SODIUM CHLORIDE 0.9 % IV SOLN: 120.0000 mg/m2 | Freq: Once | INTRAVENOUS | Status: DC

## 2022-10-30 MED ORDER — SODIUM CHLORIDE 0.9 % IV SOLN
150.0000 mg | Freq: Once | INTRAVENOUS | Status: DC
  Filled 2022-10-30: qty 5

## 2022-10-30 MED ORDER — FLUOROURACIL CHEMO INJECTION 5 GM/100ML
2400.0000 mg/m2 | Freq: Once | INTRAVENOUS | Status: AC
  Administered 2022-10-31: 4050 mg via INTRAVENOUS
  Filled 2022-10-30: qty 81

## 2022-10-30 MED ORDER — SODIUM CHLORIDE 0.9 % IV SOLN
150.0000 mg | Freq: Once | INTRAVENOUS | Status: AC
  Administered 2022-10-31: 150 mg via INTRAVENOUS
  Filled 2022-10-30: qty 5

## 2022-10-30 MED ORDER — SODIUM CHLORIDE 0.9 % IV SOLN
400.0000 mg/m2 | Freq: Once | INTRAVENOUS | Status: DC
  Filled 2022-10-30: qty 33.6

## 2022-10-30 MED ORDER — SODIUM CHLORIDE 0.9 % IV SOLN
120.0000 mg/m2 | Freq: Once | INTRAVENOUS | Status: AC
  Administered 2022-10-31: 200 mg via INTRAVENOUS
  Filled 2022-10-30: qty 10

## 2022-10-30 MED ORDER — BISACODYL 10 MG RE SUPP: 10.0000 mg | Freq: Every day | RECTAL | Status: DC | PRN

## 2022-10-30 MED ORDER — SODIUM CHLORIDE 0.9 % IV SOLN
10.0000 mg | Freq: Once | INTRAVENOUS | Status: AC
  Administered 2022-10-31: 10 mg via INTRAVENOUS
  Filled 2022-10-30: qty 1

## 2022-10-30 MED ORDER — HYDROMORPHONE 1 MG/ML IV SOLN
INTRAVENOUS | Status: DC
  Administered 2022-10-30: 2.18 mg via INTRAVENOUS
  Administered 2022-10-30: 1.91 mg via INTRAVENOUS
  Administered 2022-10-30: 3.07 mg via INTRAVENOUS
  Administered 2022-10-30: 30 mg via INTRAVENOUS
  Administered 2022-10-31: 0.936 mg via INTRAVENOUS
  Administered 2022-10-31: 3.61 mg via INTRAVENOUS
  Administered 2022-10-31: 2.74 mg via INTRAVENOUS
  Administered 2022-10-31: 3.56 mg via INTRAVENOUS
  Administered 2022-10-31: 2.7 mg via INTRAVENOUS
  Administered 2022-10-31: 2.6 mg via INTRAVENOUS
  Administered 2022-11-01: 30 mg via INTRAVENOUS
  Administered 2022-11-01: 3.32 mg via INTRAVENOUS
  Filled 2022-10-30 (×2): qty 30

## 2022-10-30 MED ORDER — PALONOSETRON HCL INJECTION 0.25 MG/5ML
0.2500 mg | Freq: Once | INTRAVENOUS | Status: DC
  Filled 2022-10-30: qty 5

## 2022-10-30 MED ORDER — OXALIPLATIN CHEMO INJECTION 100 MG/20ML
70.0000 mg/m2 | Freq: Once | INTRAVENOUS | Status: AC
  Administered 2022-10-31: 120 mg via INTRAVENOUS
  Filled 2022-10-30: qty 20

## 2022-10-30 MED ORDER — SODIUM CHLORIDE 0.9 % IV SOLN: 2400.0000 mg/m2 | INTRAVENOUS | Status: DC

## 2022-10-30 MED ORDER — PALONOSETRON HCL INJECTION 0.25 MG/5ML
0.2500 mg | Freq: Once | INTRAVENOUS | Status: AC
  Administered 2022-10-31: 0.25 mg via INTRAVENOUS
  Filled 2022-10-30: qty 5

## 2022-10-30 MED ORDER — SODIUM CHLORIDE 0.9 % IV SOLN
400.0000 mg/m2 | Freq: Once | INTRAVENOUS | Status: AC
  Administered 2022-10-31: 672 mg via INTRAVENOUS
  Filled 2022-10-30: qty 33.6

## 2022-10-30 MED ORDER — SODIUM CHLORIDE 0.9 % IV SOLN
10.0000 mg | Freq: Once | INTRAVENOUS | Status: DC
  Filled 2022-10-30: qty 1

## 2022-10-30 NOTE — Progress Notes (Signed)
ANTICOAGULATION CONSULT NOTE   Pharmacy Consult for IV heparin Indication: pulmonary embolus  Allergies  Allergen Reactions   Claritin [Loratadine] Other (See Comments)    causes spasms   Diphenhydramine Other (See Comments)    Causes spasms and keeps alert for days   Lorazepam Other (See Comments)    After a few doses it made her feel paranoid    Patient Measurements: Height: 4' 11"$  (149.9 cm) Weight: 68.1 kg (150 lb 2.1 oz) IBW/kg (Calculated) : 43.2 Heparin Dosing Weight: 59 kg  Vital Signs: Temp: 97.7 F (36.5 C) (02/12 2147) Temp Source: Oral (02/12 2147) BP: 101/63 (02/12 2147) Pulse Rate: 99 (02/12 2147)  Labs: Recent Labs    10/28/22 0103 10/28/22 0805 10/29/22 0415 10/30/22 0509  HGB 8.7*  --  8.7* 8.0*  HCT 28.4*  --  28.3* 26.1*  PLT 506*  --  564* 476*  HEPARINUNFRC 0.31 0.47 0.37 0.27*  CREATININE 0.93  --  0.73 0.64     Estimated Creatinine Clearance: 72.2 mL/min (by C-G formula based on SCr of 0.64 mg/dL).  Assessment: 46 yoF admitted on 2/2 with new finding of metastatic peritoneal carcinomatosis/ascites. CT chest on 2/8 showed incidental finding of small PE. Pharmacy is consulted to dose IV heparin.  No prior to admission anticoagulation Lovenox 56m SQ daily ordered for inpatient VTE prophylaxis (last dose on 2/8 at 1203) Baseline PT/INR WNL on 2/3  Today, 10/30/2022: HL 0.27- now sub- therapeutic on IV heparin 1750 units/hr Hgb 8.0- low, trending back down, plts high No bleeding documented  Goal of Therapy:  Heparin level 0.3-0.7 units/ml Monitor platelets by anticoagulation protocol: Yes   Plan:  Increase heparin drip slightly to 1800 units/hr  Recheck heparin level in ~ 6h Daily CBC & daily heparin level For EUS/biopsy this week F/u to start Eliquis when appropriate   MNetta Cedars PharmD, BCPS 10/30/2022 5:57 AM

## 2022-10-30 NOTE — Progress Notes (Signed)
Daily Progress Note   Patient Name: Christina [name]       Date: 10/30/2022 DOB: 1975/10/19  Age: 47 y.o. MRN#: [id number] Attending Physician: Shelly Coss, MD Primary Care Physician: [name], MD Admit Date: 10/19/2022 Length of Stay: 10 days  Reason for Consultation/Follow-up: Establishing goals of care and Symptom Management  Subjective:   CC: Worsening abdominal pain not currently controlled by IV Dilaudid.  Following up regarding symptom management.  Subjective:  Reviewed EMR prior to seeing patient.  At the time of EMR review, patient has received 10.5 mg IV Dilaudid in the past 24 hours.  Patient was n.p.o. yesterday afternoon to allow for bowel rest.  Since patient now n.p.o. discontinued oxycodone.  Patient is receiving TPN for nutritional support.  Presented to bedside to discuss care with patient.  Introduced myself as a member of the palliative medicine team; patient remembers meeting with my colleague Dr. Rowe Pavy.  Discussed patient's pain at this time.  According to patient she has had acute on chronic pain in the setting of multiple medical conditions.  Patient noted that since last May at least she has been on multiple pain medications including Naprosyn and prednisone to help with osteoarthritis pain.  She noted she has tried multiple medications for management of this and so it was not until recently that she was diagnosed with this abdominal pain from a cancer mass.  Patient describes her pain as bloating and also sharp radiating straight through to her back from her abdomen.  Patient noted the IV Dilaudid 1.5 mg does help to bring down her pain though it can wear off within 2 hours at the longest.  Patient stated that the oxycodone she had previously taken did not lead to any reducing in her pain level.  Patient tries to move in certain positions or distract herself to manage the pain when breakthrough pain occurs and she is not able to receive the IV Dilaudid yet again.   Discussed with patient's transition to n.p.o. to allow for bowel rest at this time, have considered multiple medications including IV and transdermal to manage pain appropriately at this time.  Based on patient's worsening pain and n.p.o. status at this time, patient agreeing with starting IV Dilaudid PCA.  Discussed being on a Dilaudid PCA will allow for better dose finding and more rapid management of pain.  This will likely transition to either an oral regiment or possibly a transdermal fentanyl patch since she will be starting chemo and do not want her to miss pain doses due to nausea and vomiting.  Patient agreeing with Dilaudid PCA at this time with possible conversion to transdermal fentanyl patch moving forward.  Also discussed importance of bowel movements cyst with abdominal pain management.  As patient is n.p.o. at this time, will have daily suppository available as needed.  Discussed importance of having a bowel movement at least once every 3 days.  When patient able to take p.o. medications again, will need to be on a regular scheduled bowel regimen.  Spent time providing support as able via active listening.  Noted this provider would continue to follow along with patient's journey and make adjustments to medications accordingly.  Updated bedside RN regarding plan for medication management today.  Review of Systems Abdominal pain Objective:   Vital Signs:  BP 125/83 (BP Location: Left Arm)   Pulse (!) 105   Temp (!) 97.5 F (36.4 C) (Oral)   Resp 17   Ht 4' 11"$  (1.499 m)  Wt 68.1 kg   LMP 10/12/2022   SpO2 100%   BMI 30.32 kg/m   Physical Exam: General: Laying in bed, alert, pleasant, grimacing at times due to sudden pain Eyes: No discharge noted HENT: moist mucous membranes Cardiovascular: RRR, Respiratory: no increased work of breathing noted, not in respiratory distress Abdomen: distended, tender to palpation Extremities: Moving all extremities appropriately Skin:  no rashes or lesions on visible skin Neuro: A&Ox4, following commands easily Psych: appropriately answers all questions  Imaging:  I personally reviewed recent imaging.   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  Palliative medicine team consulted to assist with symptom management.  Recommendations/Plan: # Complex medical decision making/goals of care:  -Continuing with full scope of care at this time. Did not address GOC during visit today.  -When able once pain management improved, recommend discussion regarding decision maker if patient unable to make medical decisions for herself. Sister, Gerrit Halls, is listed as NOK in children. Patient noted having multiple children.   -  Code Status: Full Code  # Symptom management:  - Pain, Acute on chronic in the setting of malignant ascites with metastatic adenocarcinoma to peritoneum with large cystic lesion in pancreatic tail and bilateral ovaries  Patient has now been transition to n.p.o. to allow for bowel rest.  Will transition pain regimen accordingly.  At time of EMR review, in past 24 hours patient has received 10.5 mg of IV Dilaudid ; approximately 132 OMEs.  -Start IV Dilaudid PCA with continuous basal dosing of 0.3 mg/h with bolus doses of 0.5 mg every 15 minutes as needed.  Continue to adjust based on patient's symptom response.   -Placing patient on PCA will allow for more appropriate dose finding so can eventually transition to medication such as transdermal fentanyl for long-acting pain management.  -Discontinue IV dilaudid prn  NOT FROM PCA once PCA initiated.    -Constipation, acute on chronic in setting of malignant ascites  Currently NPO ordered.  -Ordered for bisacodyl suppository daily prn. Discussed with patient important of having a bowel movement AT LEAST once every 3 days.   # Psychosocial Support:  -Sister listed as NOK  # Discharge Planning: TBD  -Will likely need outpatient PMT follow up at [facility].   Discussed with: patient, bedside RN  Thank you for allowing the palliative care team to participate in the care [name].  Chelsea Aus, DO Palliative Care Provider PMT # [phone]  If patient remains symptomatic despite maximum doses, please call PMT at [phone] between 0700 and 1900. Outside of these hours, please call attending, as PMT does not have night coverage.

## 2022-10-30 NOTE — TOC Progression Note (Addendum)
Transition of Care [facility]) - Progression Note    Patient Details  Name: ROIZA CORRIEA MRN: [id number] Date of Birth: November 02, 1975  Transition of Care [facility]) CM/SW [place], RN Phone [phone]  10/30/2022, 1:54 PM  Clinical Narrative:     Transition of Care [facility]) Screening Note   Patient Details  Name: SHAYENNE DONNA Date of Birth: 1976/08/03   Transition of Care [facility]) CM/SW Contact:    Angelita Ingles, RN Phone Number: 10/30/2022, 1:54 PM             Expected Discharge Plan and Services                                               Social Determinants of Health (SDOH) Interventions SDOH Screenings   Food Insecurity: No Food Insecurity (10/20/2022)  Housing: Low Risk  (10/20/2022)  Transportation Needs: No Transportation Needs (10/20/2022)  Utilities: Not At Risk (10/20/2022)  Tobacco Use: High Risk (10/29/2022)    Readmission Risk Interventions     No data to display

## 2022-10-30 NOTE — Progress Notes (Signed)
PROGRESS NOTE  Christina [name]  [id number] DOB: [birth year] DOA: 10/19/2022 PCP: [name], MD   Brief Narrative: Patient is a 47 year old female with history of anxiety,HPV, who presented with complaint of abdominal distention, constipation.  CT abdomen/pelvis showed peritoneal carcinomatosis with extensive metastasis, ascites, multiloculated cystic lesion of the pancreatic tail.  GYN oncology consulted initially .  Underwent paracentesis x 3.  Tumor markers are elevated :CA125, CEA. . 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  Oncology, GI following.  S/p  EGD, plan for EUS/biopsy as per GI after improvement in the abdominal distention.Now started  on TPN. Marland Kitchen  Hospital course remarkable for persistent abdominal pain, distention, constipation, poor oral intake.  Palliative care also following for goals of care.  Oncology planning to start chemotherapy  Assessment & Plan:  Principal Problem:   Peritoneal carcinomatosis ([place]) Active Problems:   S/P cesarean section   Generalized abdominal pain   Constipation   Ovarian mass   Pancreatic mass   Tobacco abuse   Malignant ascites   Protein-calorie malnutrition (H[facility])   Abdominal pain secondary to peritoneal carcinomatosis/ascites: Concern of extensive metastatic disease with unknown primary as seen on the CT scan.  CT concerning for bilateral cystic/solid ovarian masses, pancreatic lesion also noted.  CEA, CA125 elevated, CA 19-9 is normal. 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  Medical oncology for palliative care also consulted regarding poor prognosis.  GI consulted, status post EGD on 2/9.  EGD was negative for malignancy in the esophagus, stomach or duodenum but was found to have significant extrinsic compression  in the gastric antrum.  GI planning for endoscopic ultrasound and biopsy but want to  wait because abdomen is significantly distended. Oncology planning to start chemotherapy .  Underwent Port-A-Cath placement on 2/9. Now on PCA pump  Ascites: Underwent paracentesis twice, first on 2/3 , second on 2/6, 3rd on 2/10.   Continue supportive care.  Started on spironolactone, Lasix.  Hyponatremia: This is most likely contributed by ascites.  Currently spironolactone, Lasix.  Continue to monitor BMP  Constipation/abdominal distention:Abdominal x-ray done multiple times showing ileitis, possible partial obstruction.  Currently NPO and on TPN . NG tube placed on 2/12 with return of dark brown fluid but patient is having severe discomfort due to presence of NG tube placement and requesting [place] to remove it.  NG tube removed today  PE: Asymptomatic.  CT chest done with contrast for staging purpose showed small embolus on the proximal right lower lobe pulmonary artery.  Started on heparin drip.  Will change Eliquis when appropriate.  She is on room air  Microcytic anemia/iron deficiency/folic acid deficiency: Continue iron supplement, folic acid supplement.  With hemoglobin.  Currently in the range of 7-8  Moderate protein calorie malnutrition: Nutritionist consulted and following.  She continues to have poor oral intake.  Started  TPN, s/p PICC line  Hypokalemia/hypomagnesemia/hyponatremia: Currently being monitored and supplemented as needed  Hypertension: Continue amlodipine 10 mg , losartan .  Nutrition Problem: Increased nutrient needs Etiology: cancer and cancer related treatments    DVT prophylaxis:     Code Status: Full Code  Family Communication: Sister at the bedside on 2/9  Patient status: Inpatient  Patient is from : Home  Anticipated discharge to: unsure now  Estimated DC date: not sure   Consultants: Oncology, GI, palliative care  Procedures: Paracentesis,EGD  Antimicrobials:  Anti-infectives (From admission, onward)  None        Subjective: Patient seen and examined at bedside today.  NG tube was placed yesterday.  She is complaining of severe abdominal discomfort today with more distention.  NG tube had drained some dark fluid but this is complaining of severe discomfort due to the presence of tube.  Requesting to take it out.  Not passing gas today  Objective: Vitals:   10/29/22 0422 10/29/22 1400 10/29/22 2147 10/30/22 0635  BP: 124/81 137/89 101/63 125/83  Pulse: 99 99 99 (!) 105  Resp: 16 20 17 17  $ Temp: 97.8 F (36.6 C) 98 F (36.7 C) 97.7 F (36.5 C) (!) 97.5 F (36.4 C)  TempSrc: Oral Oral Oral Oral  SpO2: 96% 100% 99% 100%  Weight:      Height:        Intake/Output Summary (Last 24 hours) at 10/30/2022 1124 Last data filed at 10/30/2022 1000 Gross per 24 hour  Intake 1065.98 ml  Output 700 ml  Net 365.98 ml   Filed Weights   10/19/22 1156 10/26/22 1135 10/28/22 0550  Weight: 69.4 kg 69.4 kg 68.1 kg    Examination:   General exam: Uncomfortable due to abdominal distention, NG tube, appears deconditioned HEENT: NG tube Respiratory system:  no wheezes or crackles  Cardiovascular system: S1 & S2 heard, RRR.  Gastrointestinal system: Abdomen is distended, no bowel sounds heard today, diffuse tenderness Central nervous system: Alert and oriented Extremities: No edema, no clubbing ,no cyanosis Skin: No rashes, no ulcers,no icterus     Data Reviewed: I have personally reviewed following labs and imaging studies  CBC: Recent Labs  Lab 10/26/22 0427 10/27/22 0403 10/28/22 0103 10/29/22 0415 10/30/22 0509  WBC 10.7* 7.6 8.5 9.0 9.6  HGB 8.8* 7.9* 8.7* 8.7* 8.0*  HCT 28.3* 25.7* 28.4* 28.3* 26.1*  MCV 74.1* 74.3* 74.3* 74.3* 74.1*  PLT 517* 416* 506* 564* 0000000*   Basic Metabolic Panel: Recent Labs  Lab 10/24/22 0613 10/26/22 0427 10/27/22 0403 10/28/22 0103 10/29/22 0415 10/30/22 0509  NA 131* 128* 129* 129* 127* 127*  K 4.5 4.6 4.0 3.9 4.5 4.1  CL 93* 90* 92* 87* 90*  90*  CO2 27 28 27 29 26 26  $ GLUCOSE 144* 140* 135* 148* 138* 174*  BUN 14 22* 20 21* 25* 25*  CREATININE 0.60 0.69 0.64 0.93 0.73 0.64  CALCIUM 8.6* 8.4* 8.3* 8.2* 8.0* 7.8*  MG 1.8  --   --   --  2.4 2.4  PHOS  --   --   --   --  4.6 3.5     No results found for this or any previous visit (from the past 240 hour(s)).    Radiology Studies: DG Abd 1 View  Result Date: 10/30/2022 CLINICAL DATA:  Nasogastric tube placement EXAM: ABDOMEN - 1 VIEW COMPARISON:  10/29/2022 FINDINGS: An enteric tube has been placed with tip projecting over the left upper quadrant consistent with location in the body of the stomach. Mild gaseous distention of colon in the upper abdomen. IMPRESSION: Enteric tube tip projects over the left upper quadrant consistent with location in the body of the stomach. Electronically Signed   By: Lucienne Capers M.D.   On: 10/30/2022 01:48   DG Abd 2 Views  Result Date: 10/29/2022 CLINICAL DATA:  Distended abdomen EXAM: ABDOMEN - 2 VIEW COMPARISON:  10/28/2022 FINDINGS: No free air beneath the diaphragm. Persistent moderate gas distension of large and small bowel, small bowel loops distended up to 4  cm. Findings do not appear significantly changed compared to radiograph several days prior. IMPRESSION: Persistent moderate gas distension of large and small bowel, not significantly changed compared to prior and suggestive of ileus versus partial obstruction Electronically Signed   By: Donavan Foil M.D.   On: 10/29/2022 16:20   CT ABDOMEN PELVIS W CONTRAST  Result Date: 10/28/2022 CLINICAL DATA:  Abdominal pain EXAM: CT ABDOMEN AND PELVIS WITH CONTRAST TECHNIQUE: Multidetector CT imaging of the abdomen and pelvis was performed using the standard protocol following bolus administration of intravenous contrast. RADIATION DOSE REDUCTION: This exam was performed according to the departmental dose-optimization program which includes automated exposure control, adjustment of the mA and/or  kV according to patient size and/or use of iterative reconstruction technique. CONTRAST:  178m OMNIPAQUE IOHEXOL 300 MG/ML  SOLN COMPARISON:  CT 10/19/2022 FINDINGS: Lower chest: New small left pleural effusion. Hepatobiliary: Geographic hepatic steatosis. No definite liver lesion. Unremarkable gallbladder. No hyperdense gallstone. No biliary dilatation. Pancreas: Multiloculated cystic lesion of the pancreatic tail measuring approximately 7.9 x 4.5 x 4.0 cm. No ductal dilatation is seen. Spleen: Normal in size without focal abnormality. Adrenals/Urinary Tract: Unremarkable adrenal glands. Nonobstructing stones within the left kidney. Kidneys enhance symmetrically. No renal lesion. No hydronephrosis. Urinary bladder unremarkable. Stomach/Bowel: Abnormal wall thickening of the gastric antrum (series 4, image 37). Multiple dilated loops of small bowel have developed, measuring up to 4.1 cm in diameter. No definite transition point. Numerous thickened, hyperenhancing loops of distal small bowel. Moderate volume of stool within the colon. Vascular/Lymphatic: Minimal aortic atherosclerosis. No aneurysm. No lymphadenopathy is seen. Reproductive: Cystic-appearing lesion of the anterior uterine body measuring 3.4 cm (series 2, image 72) large bilateral multi-cystic adnexal masses measuring approximately 11.2 x 6.9 x 10.0 cm on the right and 10.9 x 8.2 x 10.8 cm on the left. Other: Small-moderate volume ascites, decreased in volume since prior. No organized or rim enhancing collections. No pneumoperitoneum. Similar appearance of peritoneal and omental nodularity compatible with carcinomatosis. Musculoskeletal: No acute or significant osseous findings. Mild anasarca. IMPRESSION: 1. Multiple dilated loops of small bowel have developed which may represent ileus versus partial or developing small bowel obstruction. No definite transition point is identified. 2. Numerous thickened, hyperenhancing loops of distal small bowel,  suggestive of an infectious or inflammatory enteritis. 3. Abnormal wall thickening of the gastric antrum, which could be secondary to gastritis or malignancy. Follow-up findings from recent endoscopy. 4. Large bilateral multi-cystic adnexal masses measuring up to 11.2 cm on the right and 10.8 cm on the left, concerning for primary or metastatic ovarian malignancy. 5. Multiloculated cystic lesion of the pancreatic tail measuring up to 7.9 cm. 6. Small-moderate volume ascites, decreased in volume since prior. Similar appearance of peritoneal and omental nodularity compatible with carcinomatosis. 7. New small left pleural effusion. 8. Geographic hepatic steatosis. 9. Nonobstructing left renal stones. Electronically Signed   By: NDavina PokeD.O.   On: 10/28/2022 16:36   [place] SITE RITE  Result Date: 10/28/2022 If Site Rite image not attached, placement could not be confirmed due to current cardiac rhythm.   Scheduled Meds:  (feeding supplement) PROSource Plus  30 mL Oral BID BM   amLODipine  10 mg Oral Daily   Chlorhexidine Gluconate Cloth  6 each Topical Daily   feeding supplement  1 Container Oral TID BM   ferrous sulfate  325 mg Oral Q breakfast   folic acid  1 mg Oral Daily   furosemide  40 mg Oral Daily  HYDROmorphone   Intravenous Q4H   insulin aspart  0-9 Units Subcutaneous Q6H   losartan  50 mg Oral Daily   ondansetron (ZOFRAN) IV  4 mg Intravenous Q8H   polyethylene glycol  17 g Oral BID   senna-docusate  2 tablet Oral QHS   sodium chloride flush  10-40 mL Intracatheter Q12H   spironolactone  25 mg Oral Daily   Continuous Infusions:  heparin 1,800 Units/hr (10/30/22 0611)   TPN ADULT (ION) 42 mL/hr at 10/29/22 1716   TPN ADULT (ION)       LOS: 10 days   Shelly Coss, MD [facility] P2/13/2024, 11:24 AM

## 2022-10-30 NOTE — Progress Notes (Signed)
Christina [name]   DOB:09-15-1976   [id number]   [id number]  Med/onc follow up note  Subjective: pt did not tolerate NG tube last night, had a worsening abdominal pain, nausea and gagging.  NG tube was removed this morning.  She feels her abdominal distention is slightly improved, she is on Dilaudid PCA now.  Her last bowel movement was last night, nausea is controlled, no vomiting today since his tube was removed.  She has been n.p.o. since yesterday.  Objective:  Vitals:   10/30/22 1307 10/30/22 1614  BP: 107/71   Pulse: 90   Resp: 20 16  Temp: 97.8 F (36.6 C)   SpO2: 100% 95%    Body mass index is 30.32 kg/m.  Intake/Output Summary (Last 24 hours) at 10/30/2022 1825 Last data filed at 10/30/2022 1818 Gross per 24 hour  Intake 35 ml  Output 700 ml  Net -665 ml     Sclerae unicteric  Oropharynx clear  No peripheral adenopathy  Lungs clear -- no rales or rhonchi  Heart regular rate and rhythm  Abdomen distended with mild diffuse tenderness.  MSK no focal spinal tenderness, no peripheral edema  Neuro nonfocal    CBG (last 3)  Recent Labs    10/30/22 0000 10/30/22 0629 10/30/22 1136  GLUCAP 166* 169* 185*     Labs:   Urine Studies No results for input(s): "UHGB", "CRYS" in the last 72 hours.  Invalid input(s): "UACOL", "UAPR", "USPG", "UPH", "UTP", "UGL", "UKET", "UBIL", "UNIT", "UROB", "ULEU", "UEPI", "UWBC", "URBC", "UBAC", "CAST", "UCOM", "BILUA"  Basic Metabolic Panel: Recent Labs  Lab 10/24/22 0613 10/26/22 0427 10/27/22 0403 10/28/22 0103 10/29/22 0415 10/30/22 0509  NA 131* 128* 129* 129* 127* 127*  K 4.5 4.6 4.0 3.9 4.5 4.1  CL 93* 90* 92* 87* 90* 90*  CO2 27 28 27 29 26 26  $ GLUCOSE 144* 140* 135* 148* 138* 174*  BUN 14 22* 20 21* 25* 25*  CREATININE 0.60 0.69 0.64 0.93 0.73 0.64  CALCIUM 8.6* 8.4* 8.3* 8.2* 8.0* 7.8*  MG 1.8  --   --   --  2.4 2.4  PHOS  --   --   --   --  4.6 3.5   GFR Estimated Creatinine Clearance: 72.2 mL/min (by  C-G formula based on SCr of 0.64 mg/dL). Liver Function Tests: Recent Labs  Lab 10/24/22 0613 10/29/22 0415 10/30/22 0509  AST 14* 11* 8*  ALT 16 14 14  $ ALKPHOS 82 88 88  BILITOT 0.3 0.4 0.4  PROT 5.7* 5.7* 5.6*  ALBUMIN 2.1* 2.1* 2.1*   No results for input(s): "LIPASE", "AMYLASE" in the last 168 hours. No results for input(s): "AMMONIA" in the last 168 hours. Coagulation profile No results for input(s): "INR", "PROTIME" in the last 168 hours.   CBC: Recent Labs  Lab 10/26/22 0427 10/27/22 0403 10/28/22 0103 10/29/22 0415 10/30/22 0509  WBC 10.7* 7.6 8.5 9.0 9.6  HGB 8.8* 7.9* 8.7* 8.7* 8.0*  HCT 28.3* 25.7* 28.4* 28.3* 26.1*  MCV 74.1* 74.3* 74.3* 74.3* 74.1*  PLT 517* 416* 506* 564* 476*   Cardiac Enzymes: No results for input(s): "CKTOTAL", "CKMB", "CKMBINDEX", "TROPONINI" in the last 168 hours. BNP: Invalid input(s): "POCBNP" CBG: Recent Labs  Lab 10/29/22 1147 10/29/22 1730 10/30/22 0000 10/30/22 0629 10/30/22 1136  GLUCAP 159* 149* 166* 169* 185*   D-Dimer No results for input(s): "DDIMER" in the last 72 hours. Hgb A1c No results for input(s): "HGBA1C" in the last 72 hours. Lipid Profile Recent  Labs    10/29/22 0415  TRIG 134   Thyroid function studies No results for input(s): "TSH", "T4TOTAL", "T3FREE", "THYROIDAB" in the last 72 hours.  Invalid input(s): "FREET3" Anemia work up No results for input(s): "VITAMINB12", "FOLATE", "FERRITIN", "TIBC", "IRON", "RETICCTPCT" in the last 72 hours. Microbiology No results found for this or any previous visit (from the past 240 hour(s)).     Studies:  DG Abd 1 View  Result Date: 10/30/2022 CLINICAL DATA:  Nasogastric tube placement EXAM: ABDOMEN - 1 VIEW COMPARISON:  10/29/2022 FINDINGS: An enteric tube has been placed with tip projecting over the left upper quadrant consistent with location in the body of the stomach. Mild gaseous distention of colon in the upper abdomen. IMPRESSION: Enteric tube  tip projects over the left upper quadrant consistent with location in the body of the stomach. Electronically Signed   By: Lucienne Capers M.D.   On: 10/30/2022 01:48   DG Abd 2 Views  Result Date: 10/29/2022 CLINICAL DATA:  Distended abdomen EXAM: ABDOMEN - 2 VIEW COMPARISON:  10/28/2022 FINDINGS: No free air beneath the diaphragm. Persistent moderate gas distension of large and small bowel, small bowel loops distended up to 4 cm. Findings do not appear significantly changed compared to radiograph several days prior. IMPRESSION: Persistent moderate gas distension of large and small bowel, not significantly changed compared to prior and suggestive of ileus versus partial obstruction Electronically Signed   By: Donavan Foil M.D.   On: 10/29/2022 16:20    Assessment: 47 y.o. 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 Malignant ascites status post paracentesisX3 Recurrent N/V and constipation, secondary to #1, partial bowel obstruction due to the extensive peritoneal metastasis Moderate protein and calorie malnutrition Small PE    Plan:  -Dr. Paulita Fujita saw patient today, EUS can not be done tomorrow due to lack of anesthesiologist. -I plan to start chemotherapy FOLFIRINOX before EUS.  I spoke with pharmacy and charting nurse Rollene Fare, and finally we decided to start chemotherapy at [place].m. tomorrow.  Our pharmacy will have the chemo medicine mixed in the morning, and it will be ready at 8:30am. If everything goes well, she will finish chemo around 10am on Friday 2/16. -I again reviewed the chemo benefit and AEs with pt and she agrees to proceed  -I communicated with Dr. Paulita Fujita, he plan to do EUS biopsy at 12:30pm on Friday 2/16, after she finishes chemo. Heparin drip will be held before EUS.  -appreciate palliative care team's input for her symptom management -continue TPN  -I will f/u  tomorrow morning    I spent a total of 50 mins for her care today, including care coordination.  Truitt Merle, MD 10/30/2022

## 2022-10-30 NOTE — Progress Notes (Signed)
Subjective: Couldn't tolerate keeping NGT in place. Ongoing abdominal distent.  Objective: Vital signs in last 24 hours: Temp:  [97.5 F (36.4 C)-98 F (36.7 C)] 97.5 F (36.4 C) (02/13 0635) Pulse Rate:  [99-105] 105 (02/13 0635) Resp:  [17-20] 17 (02/13 0635) BP: (101-137)/(63-89) 125/83 (02/13 0635) SpO2:  [99 %-100 %] 100 % (02/13 0635) FiO2 (%):  [0 %] 0 % (02/13 1236) Weight change:  Last BM Date : 10/27/22  PE: GEN:  Older-appearing than stated age, frail-appearng ABD:  Moderate distention with predominant tympany, mild generalized tenderness, no peritonitis.  Lab Results: CBC    Component Value Date/Time   WBC 9.6 10/30/2022 0509   RBC 3.52 (L) 10/30/2022 0509   HGB 8.0 (L) 10/30/2022 0509   HCT 26.1 (L) 10/30/2022 0509   PLT 476 (H) 10/30/2022 0509   MCV 74.1 (L) 10/30/2022 0509   MCH 22.7 (L) 10/30/2022 0509   MCHC 30.7 10/30/2022 0509   RDW 16.3 (H) 10/30/2022 0509   LYMPHSABS 1.9 04/11/2016 1310   MONOABS 0.3 04/11/2016 1310   EOSABS 0.2 04/11/2016 1310   BASOSABS 0.0 04/11/2016 1310  CMP     Component Value Date/Time   NA 127 (L) 10/30/2022 0509   K 4.1 10/30/2022 0509   CL 90 (L) 10/30/2022 0509   CO2 26 10/30/2022 0509   GLUCOSE 174 (H) 10/30/2022 0509   BUN 25 (H) 10/30/2022 0509   CREATININE 0.64 10/30/2022 0509   CALCIUM 7.8 (L) 10/30/2022 0509   PROT 5.6 (L) 10/30/2022 0509   ALBUMIN 2.1 (L) 10/30/2022 0509   AST 8 (L) 10/30/2022 0509   ALT 14 10/30/2022 0509   ALKPHOS 88 10/30/2022 0509   BILITOT 0.4 10/30/2022 0509   GFRNONAA >60 10/30/2022 0509   GFRAA >60 12/03/2016 1142   ABD xray:  Dilated small bowel loops  Assessment:   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 Abdominal distention.  Suspect ileus +/- incipient obstruction, less strongly contributing from ascites.  Plan:   I have explained to patient the increased risk of anesthesia for EUS in setting of significant  abdominal distention (which it seems is not anything we can improve with conservative therapies), also explained relative decrease in visibility due to interfering bowel gas.  That said, did mention that it might help explain source of carcinomatosis which in turn could help tailor treatment. I was planning to do EUS tomorrow, however I'm told by our endoscopy nursing staff that there is no anesthesia availability to do this procedure until 930 Thursday morning.  Defer to Dr. Burr Medico whether to wait for EUS Thursday or go ahead with chemotherapy without doing EUS.   Landry Dyke 10/30/2022, 1:07 PM   Cell [phone] If no answer or after 5 PM call [phone]

## 2022-10-30 NOTE — Progress Notes (Signed)
PHARMACY - TOTAL PARENTERAL NUTRITION CONSULT NOTE   Indication:  gastric outlet obstruction with extrinsic compression   Patient Measurements: Height: 4' 11"$  (149.9 cm) Weight: 68.1 kg (150 lb 2.1 oz) IBW/kg (Calculated) : 43.2 TPN AdjBW (KG): 49.8 Body mass index is 30.32 kg/m. 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 Malignant ascites s/p paracentesis x 3. EGD: gastric outlet obstruction w/ extrinsic compression. Pharmacy consulted to manage TPN for nutrition support.   Glucose / Insulin: no hx DM. 3 units SSI used since initiation of TPN. Glucose 149-169.  Electrolytes:  Na low at 127, Cl low at 90. Others, including Corrected Calcium, WNL.  Renal: SCr WNL Hepatic: AST low, ALT, Alk Phos, Tbili WNL Triglycerides: WNL (2/12) Intake / Output; MIVF: no IVF; I/O not accurately recorded GI Imaging:  2/11 CT abd/pelvis: Multiple dilated loops of small bowel have developed which may represent ileus versus partial or developing small bowel obstruction. Numerous thickened, hyperenhancing loops of distal small bowel, suggestive of infectious or inflammatory enteritis. Abnormal wall thickening of the gastric antrum, which could be secondary to gastritis or malignancy. Large bilateral multi-cystic adnexal masses concerning for primary or metastatic ovarian malignancy. Multiloculated cystic lesion of the pancreatic tail. 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 2/12 abdominal X-ray: Persistent moderate gas distension of large and small bowel suggestive of ileus vs. partial obstruction GI Surgeries / Procedures:  2/9 EGD: gastric outlet obstruction with extrinsic compression  Central access: single lumen PAC placed on 2/9 for chemo, PICC placed on 2/12 for TPN TPN start date: 2/12  Nutritional Goals: Goal TPN rate is 65 mL/hr (provides 86 g of  protein and 1607 kcals per day)  RD Assessment: Estimated Needs Total Energy Estimated Needs: 1500-1700 Total Protein Estimated Needs: 75-90g Total Fluid Estimated Needs: 1.5L/day  Current Nutrition:  NPO TPN  Significant Events: 2/11: [facility] unable to compound TPN today > will start 2/12, Dr Lonn Georgia & RN informed.  Plan:  Increase TPN rate to 55 mL/hr at 1800 Electrolytes in TPN:  Na 137mq/L K 34m/L Ca 8m40mL Mg 3mE26m Phos 18mm93m Max chloride Add standard MVI and trace elements to TPN Continue Sensitive q6h SSI and adjust as needed  Thiamine 100 mg/day x 5 days (D #2/5) Monitor TPN labs on Mon/Thurs CMET, Mag, Phos in AM   JignaLindell SparrmD, BCPS Pharmacy: [phone]/2024 7:17 AM

## 2022-10-30 NOTE — Progress Notes (Addendum)
[place] for IV heparin Indication: pulmonary embolus  Allergies  Allergen Reactions   Claritin [Loratadine] Other (See Comments)    causes spasms   Diphenhydramine Other (See Comments)    Causes spasms and keeps alert for days   Lorazepam Other (See Comments)    After a few doses it made her feel paranoid    Patient Measurements: Height: 4' 11"$  (149.9 cm) Weight: 68.1 kg (150 lb 2.1 oz) IBW/kg (Calculated) : 43.2 Heparin Dosing Weight: 59 kg  Vital Signs: Temp: 97.8 F (36.6 C) (02/13 1307) Temp Source: Oral (02/13 1307) BP: 107/71 (02/13 1307) Pulse Rate: 90 (02/13 1307)  Labs: Recent Labs    10/28/22 0103 10/28/22 0805 10/29/22 0415 10/30/22 0509 10/30/22 1310  HGB 8.7*  --  8.7* 8.0*  --   HCT 28.4*  --  28.3* 26.1*  --   PLT 506*  --  564* 476*  --   HEPARINUNFRC 0.31   < > 0.37 0.27* 0.46  CREATININE 0.93  --  0.73 0.64  --    < > = values in this interval not displayed.     Estimated Creatinine Clearance: 72.2 mL/min (by C-G formula based on SCr of 0.64 mg/dL).  Assessment: 38 yoF admitted on 2/2 with new finding of metastatic peritoneal carcinomatosis/ascites. CT chest on 2/8 showed incidental finding of small PE. Pharmacy is consulted to dose IV heparin.  No prior to admission anticoagulation Lovenox 58m SQ daily ordered for inpatient VTE prophylaxis (last dose on 2/8 at 1203) Baseline PT/INR WNL on 2/3  Today, 10/30/2022: HL 0.46- now therapeutic on IV heparin 1800 units/hr Hgb 8.0- low, trending back down, plts high No bleeding documented  Goal of Therapy:  Heparin level 0.3-0.7 units/ml Monitor platelets by anticoagulation protocol: Yes   Plan:  Continue heparin drip at 1800 units/hr  Recheck heparin level in ~ 6h to verify therapeutic Daily CBC & daily heparin level For EUS/biopsy this week - f/u if/when to hold heparin drip prior to procedure   EPeggyann Juba PharmD, BCPS Pharmacy:  [phone]/13/2024 3:06 PM   Addendum: Confirmatory heparin level is therapeutic at 0.44.  Continue current heparin rate and f/u level in AM.  EPeggyann Juba PharmD, BCPS 10/30/2022 8:45 PM

## 2022-10-31 DIAGNOSIS — I2699 Other pulmonary embolism without acute cor pulmonale: Secondary | ICD-10-CM | POA: Diagnosis not present

## 2022-10-31 DIAGNOSIS — R180 Malignant ascites: Secondary | ICD-10-CM | POA: Diagnosis not present

## 2022-10-31 DIAGNOSIS — E440 Moderate protein-calorie malnutrition: Secondary | ICD-10-CM | POA: Diagnosis not present

## 2022-10-31 DIAGNOSIS — C786 Secondary malignant neoplasm of retroperitoneum and peritoneum: Secondary | ICD-10-CM | POA: Diagnosis not present

## 2022-10-31 LAB — CBC
HCT: 24.5 % — ABNORMAL LOW (ref 36.0–46.0)
Hemoglobin: 7.5 g/dL — ABNORMAL LOW (ref 12.0–15.0)
MCH: 22.7 pg — ABNORMAL LOW (ref 26.0–34.0)
MCHC: 30.6 g/dL (ref 30.0–36.0)
MCV: 74.2 fL — ABNORMAL LOW (ref 80.0–100.0)
Platelets: 426 10*3/uL — ABNORMAL HIGH (ref 150–400)
RBC: 3.3 MIL/uL — ABNORMAL LOW (ref 3.87–5.11)
RDW: 16.3 % — ABNORMAL HIGH (ref 11.5–15.5)
WBC: 7.5 10*3/uL (ref 4.0–10.5)
nRBC: 0 % (ref 0.0–0.2)

## 2022-10-31 LAB — GLUCOSE, CAPILLARY
Glucose-Capillary: 169 mg/dL — ABNORMAL HIGH (ref 70–99)
Glucose-Capillary: 273 mg/dL — ABNORMAL HIGH (ref 70–99)
Glucose-Capillary: 229 mg/dL — ABNORMAL HIGH (ref 70–99)
Glucose-Capillary: 212 mg/dL — ABNORMAL HIGH (ref 70–99)
Glucose-Capillary: 191 mg/dL — ABNORMAL HIGH (ref 70–99)

## 2022-10-31 LAB — COMPREHENSIVE METABOLIC PANEL
ALT: 15 U/L (ref 0–44)
AST: 13 U/L — ABNORMAL LOW (ref 15–41)
Albumin: 1.9 g/dL — ABNORMAL LOW (ref 3.5–5.0)
Alkaline Phosphatase: 89 U/L (ref 38–126)
Anion gap: 8 (ref 5–15)
BUN: 26 mg/dL — ABNORMAL HIGH (ref 6–20)
CO2: 23 mmol/L (ref 22–32)
Calcium: 7.9 mg/dL — ABNORMAL LOW (ref 8.9–10.3)
Chloride: 96 mmol/L — ABNORMAL LOW (ref 98–111)
Creatinine, Ser: 0.56 mg/dL (ref 0.44–1.00)
GFR, Estimated: >60 mL/min (ref >60)
Glucose, Bld: 188 mg/dL — ABNORMAL HIGH (ref 70–99)
Potassium: 4.4 mmol/L (ref 3.5–5.1)
Sodium: 127 mmol/L — ABNORMAL LOW (ref 135–145)
Total Bilirubin: 0.3 mg/dL (ref 0.3–1.2)
Total Protein: 5.4 g/dL — ABNORMAL LOW (ref 6.5–8.1)

## 2022-10-31 LAB — MAGNESIUM: Magnesium: 2.3 mg/dL (ref 1.7–2.4)

## 2022-10-31 LAB — HEPARIN LEVEL (UNFRACTIONATED): Heparin Unfractionated: 0.56 IU/mL (ref 0.30–0.70)

## 2022-10-31 LAB — PHOSPHORUS: Phosphorus: 4 mg/dL (ref 2.5–4.6)

## 2022-10-31 MED ORDER — TRAVASOL 10 % IV SOLN
INTRAVENOUS | Status: AC
  Filled 2022-10-31: qty 858

## 2022-10-31 MED ORDER — LIP MEDEX EX OINT
1.0000 | TOPICAL_OINTMENT | CUTANEOUS | Status: DC | PRN
  Administered 2022-10-31: 1 via TOPICAL
  Filled 2022-10-31 (×3): qty 7

## 2022-10-31 MED ORDER — INSULIN ASPART 100 UNIT/ML IJ SOLN: 0.0000 [IU] | Freq: Four times a day (QID) | INTRAMUSCULAR | Status: DC

## 2022-10-31 MED ORDER — INSULIN ASPART 100 UNIT/ML IJ SOLN
0.0000 [IU] | INTRAMUSCULAR | Status: DC
  Administered 2022-10-31: 3 [IU] via SUBCUTANEOUS
  Administered 2022-10-31: 5 [IU] via SUBCUTANEOUS
  Administered 2022-11-01 (×4): 3 [IU] via SUBCUTANEOUS
  Administered 2022-11-01: 5 [IU] via SUBCUTANEOUS
  Administered 2022-11-02 (×2): 2 [IU] via SUBCUTANEOUS
  Administered 2022-11-02 – 2022-11-03 (×8): 3 [IU] via SUBCUTANEOUS
  Administered 2022-11-03: 2 [IU] via SUBCUTANEOUS
  Administered 2022-11-03: 3 [IU] via SUBCUTANEOUS
  Administered 2022-11-04: 2 [IU] via SUBCUTANEOUS
  Administered 2022-11-04: 3 [IU] via SUBCUTANEOUS
  Administered 2022-11-04: 8 [IU] via SUBCUTANEOUS
  Administered 2022-11-04 – 2022-11-05 (×4): 3 [IU] via SUBCUTANEOUS
  Administered 2022-11-05: 2 [IU] via SUBCUTANEOUS
  Administered 2022-11-05: 3 [IU] via SUBCUTANEOUS
  Administered 2022-11-05 – 2022-11-06 (×3): 2 [IU] via SUBCUTANEOUS
  Administered 2022-11-06: 5 [IU] via SUBCUTANEOUS
  Administered 2022-11-06: 2 [IU] via SUBCUTANEOUS
  Administered 2022-11-06 (×2): 3 [IU] via SUBCUTANEOUS
  Administered 2022-11-07: 5 [IU] via SUBCUTANEOUS
  Administered 2022-11-07: 2 [IU] via SUBCUTANEOUS
  Administered 2022-11-07 (×2): 3 [IU] via SUBCUTANEOUS
  Administered 2022-11-07 (×2): 2 [IU] via SUBCUTANEOUS
  Administered 2022-11-08: 3 [IU] via SUBCUTANEOUS
  Administered 2022-11-08 (×2): 2 [IU] via SUBCUTANEOUS
  Administered 2022-11-08: 3 [IU] via SUBCUTANEOUS
  Administered 2022-11-08: 2 [IU] via SUBCUTANEOUS
  Administered 2022-11-08 – 2022-11-09 (×2): 3 [IU] via SUBCUTANEOUS

## 2022-10-31 NOTE — Progress Notes (Signed)
Christina [name]   DOB:03-30-1976   [id number]   [id number]  Med/onc follow up note  Subjective: pt slept well last night, has been passing gases, no BM yesterday.  Her abdominal bloating is stable.  Her pain is most controlled.  No other new complaints.  Objective:  Vitals:   10/31/22 0330 10/31/22 0600  BP:  105/67  Pulse:  88  Resp: 20 18  Temp:  98.3 F (36.8 C)  SpO2: 98% 95%    Body mass index is 30.06 kg/m.  Intake/Output Summary (Last 24 hours) at 10/31/2022 0827 Last data filed at 10/31/2022 0315 Gross per 24 hour  Intake 1122.6 ml  Output 100 ml  Net 1022.6 ml     Sclerae unicteric  Oropharynx clear  No peripheral adenopathy  Lungs clear -- no rales or rhonchi  Heart regular rate and rhythm  Abdomen distended with mild diffuse tenderness.  MSK no focal spinal tenderness, no peripheral edema  Neuro nonfocal    CBG (last 3)  Recent Labs    10/30/22 1850 10/30/22 2317 10/31/22 0523  GLUCAP 177* 151* 169*     Labs:   Urine Studies No results for input(s): "UHGB", "CRYS" in the last 72 hours.  Invalid input(s): "UACOL", "UAPR", "USPG", "UPH", "UTP", "UGL", "UKET", "UBIL", "UNIT", "UROB", "ULEU", "UEPI", "UWBC", "URBC", "UBAC", "CAST", "UCOM", "BILUA"  Basic Metabolic Panel: Recent Labs  Lab 10/27/22 0403 10/28/22 0103 10/29/22 0415 10/30/22 0509 10/31/22 0520  NA 129* 129* 127* 127* 127*  K 4.0 3.9 4.5 4.1 4.4  CL 92* 87* 90* 90* 96*  CO2 27 29 26 26 23  $ GLUCOSE 135* 148* 138* 174* 188*  BUN 20 21* 25* 25* 26*  CREATININE 0.64 0.93 0.73 0.64 0.56  CALCIUM 8.3* 8.2* 8.0* 7.8* 7.9*  MG  --   --  2.4 2.4 2.3  PHOS  --   --  4.6 3.5 4.0   GFR Estimated Creatinine Clearance: 71.8 mL/min (by C-G formula based on SCr of 0.56 mg/dL). Liver Function Tests: Recent Labs  Lab 10/29/22 0415 10/30/22 0509 10/31/22 0520  AST 11* 8* 13*  ALT 14 14 15  $ ALKPHOS 88 88 89  BILITOT 0.4 0.4 0.3  PROT 5.7* 5.6* 5.4*  ALBUMIN 2.1* 2.1* 1.9*   No  results for input(s): "LIPASE", "AMYLASE" in the last 168 hours. No results for input(s): "AMMONIA" in the last 168 hours. Coagulation profile No results for input(s): "INR", "PROTIME" in the last 168 hours.   CBC: Recent Labs  Lab 10/27/22 0403 10/28/22 0103 10/29/22 0415 10/30/22 0509 10/31/22 0520  WBC 7.6 8.5 9.0 9.6 7.5  HGB 7.9* 8.7* 8.7* 8.0* 7.5*  HCT 25.7* 28.4* 28.3* 26.1* 24.5*  MCV 74.3* 74.3* 74.3* 74.1* 74.2*  PLT 416* 506* 564* 476* 426*   Cardiac Enzymes: No results for input(s): "CKTOTAL", "CKMB", "CKMBINDEX", "TROPONINI" in the last 168 hours. BNP: Invalid input(s): "POCBNP" CBG: Recent Labs  Lab 10/30/22 0629 10/30/22 1136 10/30/22 1850 10/30/22 2317 10/31/22 0523  GLUCAP 169* 185* 177* 151* 169*   D-Dimer No results for input(s): "DDIMER" in the last 72 hours. Hgb A1c No results for input(s): "HGBA1C" in the last 72 hours. Lipid Profile Recent Labs    10/29/22 0415  TRIG 134   Thyroid function studies No results for input(s): "TSH", "T4TOTAL", "T3FREE", "THYROIDAB" in the last 72 hours.  Invalid input(s): "FREET3" Anemia work up No results for input(s): "VITAMINB12", "FOLATE", "FERRITIN", "TIBC", "IRON", "RETICCTPCT" in the last 72 hours. Microbiology No results found  for this or any previous visit (from the past 240 hour(s)).     Studies:  DG Abd 1 View  Result Date: 10/30/2022 CLINICAL DATA:  Nasogastric tube placement EXAM: ABDOMEN - 1 VIEW COMPARISON:  10/29/2022 FINDINGS: An enteric tube has been placed with tip projecting over the left upper quadrant consistent with location in the body of the stomach. Mild gaseous distention of colon in the upper abdomen. IMPRESSION: Enteric tube tip projects over the left upper quadrant consistent with location in the body of the stomach. Electronically Signed   By: Lucienne Capers M.D.   On: 10/30/2022 01:48   DG Abd 2 Views  Result Date: 10/29/2022 CLINICAL DATA:  Distended abdomen EXAM:  ABDOMEN - 2 VIEW COMPARISON:  10/28/2022 FINDINGS: No free air beneath the diaphragm. Persistent moderate gas distension of large and small bowel, small bowel loops distended up to 4 cm. Findings do not appear significantly changed compared to radiograph several days prior. IMPRESSION: Persistent moderate gas distension of large and small bowel, not significantly changed compared to prior and suggestive of ileus versus partial obstruction Electronically Signed   By: Donavan Foil M.D.   On: 10/29/2022 16:20    Assessment: 47 y.o. 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 Malignant ascites status post paracentesisX3 Recurrent N/V and constipation, secondary to #1, partial bowel obstruction due to the extensive peritoneal metastasis Moderate protein and calorie malnutrition Small PE    Plan:  -Lab reviewed, adequate for treatment, will proceed for cycle FOLFIRINOX with dose reduction today -I spoke with her nurse, and pharmacy, to coordinate her chemotherapy, which was started around 8:30 AM today. -Continue supportive care -Her EUS biopsy is scheduled for Friday 1230 -I will follow-up daily this week.  Truitt Merle, MD 10/31/2022

## 2022-10-31 NOTE — Progress Notes (Signed)
Medical oncology note  Pt tolerated chemo well overall,noticed some blurry vision, no double vision or other neurological symptoms. 5-fu started at 2:30pm today, so we increased her pump infusion rate from 7m/hr to 24.647mhr, so she can complete 5-fu infusion around 11:30am on Friday, before she goes to EUS procedure at 12:30pm.   YaTruitt Merle2/14/2024 My cell [phone]

## 2022-10-31 NOTE — Progress Notes (Signed)
PROGRESS NOTE  Christina [name]  [id number] DOB: 04/10/1976 DOA: 10/19/2022 PCP: [name], MD   Brief Narrative: Patient is a 47 year old female with history of anxiety,HPV, who presented with complaint of abdominal distention, constipation.  CT abdomen/pelvis showed peritoneal carcinomatosis with extensive metastasis, ascites, multiloculated cystic lesion of the pancreatic tail.  GYN oncology consulted initially .  Underwent paracentesis x 3.  Tumor markers are elevated :CA125, CEA. . 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  Oncology, GI following.  S/p  EGD, plan for EUS/biopsy as per GI on Friday.Now started  on TPN. Marland Kitchen  Hospital course remarkable for persistent abdominal pain, distention, constipation, poor oral intake.  Palliative care also following for goals of care.  Oncology planning to start chemotherapy soon  Assessment & Plan:  Principal Problem:   Peritoneal carcinomatosis ([place]) Active Problems:   S/P cesarean section   Generalized abdominal pain   Constipation   Ovarian mass   Pancreatic mass   Tobacco abuse   Malignant ascites   Protein-calorie malnutrition ([facility])   Cancer associated pain   High risk medication use   Palliative care encounter   Abdominal pain secondary to peritoneal carcinomatosis/ascites: Concern of extensive metastatic disease with unknown primary as seen on the CT scan.  CT concerning for bilateral cystic/solid ovarian masses, pancreatic lesion also noted.  CEA, CA125 elevated, CA 19-9 is normal. 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  Medical oncology for palliative care also consulted regarding poor prognosis.  GI consulted, status post EGD on 2/9.  EGD was negative for malignancy in the esophagus, stomach or duodenum but was found to have significant extrinsic compression  in the gastric antrum.  GI  planning for endoscopic ultrasound and biopsy but wanted to wait because abdomen is significantly distended,now plan for Friday Oncology planning to start chemotherapy .  Underwent Port-A-Cath placement on 2/9. Now on PCA pump  Ascites: Underwent paracentesis twice, first on 2/3 , second on 2/6, 3rd on 2/10.   Continue supportive care.  Started on spironolactone, Lasix.  Hyponatremia: This is most likely contributed by ascites.  Currently spironolactone, Lasix.  Continue to monitor BMP  Constipation/abdominal distention:Abdominal x-ray done multiple times showing ileus, possible partial obstruction.  Currently NPO and on TPN . NG tube placed on 2/12 with return of dark brown fluid but patient was having severe discomfort due to presence of NG tube placement and requesting [place] to remove it.  NG tube removed  PE: Asymptomatic.  CT chest done with contrast for staging purpose showed small embolus on the proximal right lower lobe pulmonary artery.  Started on heparin drip.  Will change Eliquis when appropriate.  She is on room air  Microcytic anemia/iron deficiency/folic acid deficiency: Continue iron supplement, folic acid supplement.  Monitor  hemoglobin.  Currently in the range of 7-8  Moderate protein calorie malnutrition: Nutritionist consulted and following.   Started  TPN, s/p PICC line  Hypokalemia/hypomagnesemia/hyponatremia: Currently being monitored and supplemented as needed  Hypertension: Continue amlodipine 10 mg , losartan .  Nutrition Problem: Increased nutrient needs Etiology: cancer and cancer related treatments    DVT prophylaxis:     Code Status: Full Code  Family Communication: Sister at the bedside on 2/9  Patient status: Inpatient  Patient is from : Home  Anticipated discharge to: unsure now  Estimated DC date: not sure   Consultants: Oncology, GI, palliative care  Procedures: Paracentesis,EGD  Antimicrobials:  Anti-infectives (From admission, onward)     None       Subjective: Patient seen and examined at bedside today.  Hemodynamically stable.  Looks more comfortable than yesterday.  On PCA pump.  She states she is passing gas today.  Denies any significant abdominal pain, nausea or vomiting today  Objective: Vitals:   10/31/22 0330 10/31/22 0524 10/31/22 0600 10/31/22 0852  BP:   105/67   Pulse:   88   Resp: 20  18 19  $ Temp:   98.3 F (36.8 C)   TempSrc:   Oral   SpO2: 98%  95% 100%  Weight:  67.5 kg    Height:        Intake/Output Summary (Last 24 hours) at 10/31/2022 1035 Last data filed at 10/31/2022 0315 Gross per 24 hour  Intake 1122.6 ml  Output --  Net 1122.6 ml   Filed Weights   10/26/22 1135 10/28/22 0550 10/31/22 0524  Weight: 69.4 kg 68.1 kg 67.5 kg    Examination:  General exam: Overall comfortable, not in distress, weak HEENT: PERRL Respiratory system:  no wheezes or crackles  Cardiovascular system: S1, S2, no murmur Gastrointestinal system: Abdomen is distended, some bowel sounds heard  Central nervous system: Alert and oriented Extremities: No edema, no clubbing ,no cyanosis Skin: No rashes, no ulcers,no icterus     Data Reviewed: I have personally reviewed following labs and imaging studies  CBC: Recent Labs  Lab 10/27/22 0403 10/28/22 0103 10/29/22 0415 10/30/22 0509 10/31/22 0520  WBC 7.6 8.5 9.0 9.6 7.5  HGB 7.9* 8.7* 8.7* 8.0* 7.5*  HCT 25.7* 28.4* 28.3* 26.1* 24.5*  MCV 74.3* 74.3* 74.3* 74.1* 74.2*  PLT 416* 506* 564* 476* 123XX123*   Basic Metabolic Panel: Recent Labs  Lab 10/27/22 0403 10/28/22 0103 10/29/22 0415 10/30/22 0509 10/31/22 0520  NA 129* 129* 127* 127* 127*  K 4.0 3.9 4.5 4.1 4.4  CL 92* 87* 90* 90* 96*  CO2 27 29 26 26 23  $ GLUCOSE 135* 148* 138* 174* 188*  BUN 20 21* 25* 25* 26*  CREATININE 0.64 0.93 0.73 0.64 0.56  CALCIUM 8.3* 8.2* 8.0* 7.8* 7.9*  MG  --   --  2.4 2.4 2.3  PHOS  --   --  4.6 3.5 4.0     No results found for this or any previous  visit (from the past 240 hour(s)).    Radiology Studies: DG Abd 1 View  Result Date: 10/30/2022 CLINICAL DATA:  Nasogastric tube placement EXAM: ABDOMEN - 1 VIEW COMPARISON:  10/29/2022 FINDINGS: An enteric tube has been placed with tip projecting over the left upper quadrant consistent with location in the body of the stomach. Mild gaseous distention of colon in the upper abdomen. IMPRESSION: Enteric tube tip projects over the left upper quadrant consistent with location in the body of the stomach. Electronically Signed   By: Lucienne Capers M.D.   On: 10/30/2022 01:48   DG Abd 2 Views  Result Date: 10/29/2022 CLINICAL DATA:  Distended abdomen EXAM: ABDOMEN - 2 VIEW COMPARISON:  10/28/2022 FINDINGS: No free air beneath the diaphragm. Persistent moderate gas distension of large and small bowel, small bowel loops distended up to 4 cm. Findings do not appear significantly changed compared to radiograph several days prior. IMPRESSION: Persistent moderate gas distension of large and small bowel, not significantly changed compared to prior and suggestive of ileus versus partial obstruction Electronically Signed   By: Donavan Foil M.D.   On:  10/29/2022 16:20    Scheduled Meds:  (feeding supplement) PROSource Plus  30 mL Oral BID BM   amLODipine  10 mg Oral Daily   Chlorhexidine Gluconate Cloth  6 each Topical Daily   feeding supplement  1 Container Oral TID BM   ferrous sulfate  325 mg Oral Q breakfast   fluorouracil (ADRUCIL) 4,050 mg in dextrose 5 % 1,000 mL chemo infusion  2,400 mg/m2 (Treatment Plan Recorded) Intravenous Once   folic acid  1 mg Oral Daily   furosemide  40 mg Oral Daily   HYDROmorphone   Intravenous Q4H   insulin aspart  0-9 Units Subcutaneous Q6H   irinotecan (CAMPTOSAR) 200 mg in sodium chloride 0.9 % 500 mL chemo infusion  120 mg/m2 (Treatment Plan Recorded) Intravenous Once   leucovorin 672 mg in sodium chloride 0.9 % 250 mL infusion  400 mg/m2 (Treatment Plan Recorded)  Intravenous Once   losartan  50 mg Oral Daily   oxaliplatin (ELOXATIN) 120 mg in dextrose 5 % 500 mL chemo infusion  70 mg/m2 (Treatment Plan Recorded) Intravenous Once   polyethylene glycol  17 g Oral BID   senna-docusate  2 tablet Oral QHS   sodium chloride flush  10-40 mL Intracatheter Q12H   spironolactone  25 mg Oral Daily   Continuous Infusions:  heparin 1,800 Units/hr (10/31/22 0921)   TPN ADULT (ION) 55 mL/hr at 10/31/22 0315   TPN ADULT (ION)       LOS: 11 days   Shelly Coss, MD [facility] P2/14/2024, 10:35 AM

## 2022-10-31 NOTE — Progress Notes (Addendum)
[place] for IV heparin Indication: pulmonary embolus  Allergies  Allergen Reactions   Claritin [Loratadine] Other (See Comments)    causes spasms   Diphenhydramine Other (See Comments)    Causes spasms and keeps alert for days   Lorazepam Other (See Comments)    After a few doses it made her feel paranoid    Patient Measurements: Height: 4' 11"$  (149.9 cm) Weight: 67.5 kg (148 lb 13 oz) IBW/kg (Calculated) : 43.2 Heparin Dosing Weight: 59 kg  Vital Signs: Temp: 98.3 F (36.8 C) (02/14 0600) Temp Source: Oral (02/14 0600) BP: 105/67 (02/14 0600) Pulse Rate: 88 (02/14 0600)  Labs: Recent Labs    10/29/22 0415 10/30/22 0509 10/30/22 1310 10/30/22 2015 10/31/22 0520  HGB 8.7* 8.0*  --   --  7.5*  HCT 28.3* 26.1*  --   --  24.5*  PLT 564* 476*  --   --  426*  HEPARINUNFRC 0.37 0.27* 0.46 0.44 0.56  CREATININE 0.73 0.64  --   --  0.56     Estimated Creatinine Clearance: 71.8 mL/min (by C-G formula based on SCr of 0.56 mg/dL).  Assessment: 34 yoF admitted on 2/2 with new finding of metastatic peritoneal carcinomatosis/ascites. CT chest on 2/8 showed incidental finding of small PE. Pharmacy is consulted to dose IV heparin.  No prior to admission anticoagulation Lovenox 14m SQ daily ordered for inpatient VTE prophylaxis (last dose on 2/8 at 1203) Baseline PT/INR WNL on 2/3  Today, 10/31/2022: Heparin level = 0.56 units/mL, remains therapeutic on IV heparin at 1800 units/hr CBC: Hgb low/decreased to 7.5. Plt high. No bleeding or infusion issues noted per nursing  Goal of Therapy:  Heparin level 0.3-0.7 units/ml Monitor platelets by anticoagulation protocol: Yes   Plan:  Continue IV heparin infusion at 1800 units/hr  Daily CBC, heparin level Monitor closely for s/sx of bleeding For EUS/biopsy this Friday - MD, please indicate when to hold heparin infusion prior to procedure   JLindell Spar PharmD, BCPS Pharmacy:  [phone]/14/2024 7:41 AM

## 2022-10-31 NOTE — Progress Notes (Signed)
Oncology to start chemotherapy.  Dr. Burr Medico requests EUS Friday (by which time first inpatient dose of chemotherapy will be finished), which we have scheduled.  [facility] will revisit tomorrow.

## 2022-10-31 NOTE — Progress Notes (Addendum)
PHARMACY - TOTAL PARENTERAL NUTRITION CONSULT NOTE   Indication:  gastric outlet obstruction with extrinsic compression   Patient Measurements: Height: 4' 11"$  (149.9 cm) Weight: 67.5 kg (148 lb 13 oz) IBW/kg (Calculated) : 43.2 TPN AdjBW (KG): 49.8 Body mass index is 30.06 kg/m. 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 Malignant ascites s/p paracentesis x 3. EGD: gastric outlet obstruction w/ extrinsic compression. Pharmacy consulted to manage TPN for nutrition support.   Glucose / Insulin: no hx DM. 8 units SSI used in past 24h. Glucose 151-185 (goal < 150).  Electrolytes:  Na remains low at 127, Cl low but improved to 96. Others, including Corrected Calcium, WNL.  Renal: SCr WNL Hepatic: AST low, ALT, Alk Phos, Tbili WNL Triglycerides: WNL (2/12) Intake / Output; MIVF: I/O not accurately recorded. IVF: D5W @ 58m/hr when chemotherapy starts GI Imaging:  2/11 CT abd/pelvis: Multiple dilated loops of small bowel have developed which may represent ileus versus partial or developing small bowel obstruction. Numerous thickened, hyperenhancing loops of distal small bowel, suggestive of infectious or inflammatory enteritis. Abnormal wall thickening of the gastric antrum, which could be secondary to gastritis or malignancy. Large bilateral multi-cystic adnexal masses concerning for primary or metastatic ovarian malignancy. Multiloculated cystic lesion of the pancreatic tail. 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 2/12 abdominal X-ray: Persistent moderate gas distension of large and small bowel suggestive of ileus vs. partial obstruction GI Surgeries / Procedures:  2/9 EGD: gastric outlet obstruction with extrinsic compression  Central access: single lumen PAC placed on 2/9 for chemo, PICC placed on 2/12 for TPN TPN start date: 2/12  Nutritional  Goals: Goal TPN rate is 65 mL/hr (provides 86 g of protein and 1607 kcals per day)  RD Assessment: Estimated Needs Total Energy Estimated Needs: 1500-1700 Total Protein Estimated Needs: 75-90g Total Fluid Estimated Needs: 1.5L/day  Current Nutrition:  NPO TPN  Significant Events: [facility] > will start 2/12, Dr ATawanna Solo& RN informed.  Plan:  Increase TPN to goal rate of 65 mL/hr at 1800 Electrolytes in TPN:  Na 1573m/L K 1553mL Ca 5mE101m Mg 2mEq57mPhos 10mmo23mMax chloride Add standard MVI and trace elements to TPN Change to Moderate q4h SSI and adjust as needed  Thiamine 100 mg/day x 5 days (D #3/5) Monitor TPN labs on Mon/Thurs   Saron Vanorman Lindell SparmD, BCPS Pharmacy: [phone] 8:00 AM

## 2022-11-01 ENCOUNTER — Other Ambulatory Visit: Payer: Self-pay

## 2022-11-01 ENCOUNTER — Inpatient Hospital Stay (HOSPITAL_COMMUNITY): Payer: Medicaid Other | Admitting: Certified Registered Nurse Anesthetist

## 2022-11-01 DIAGNOSIS — C786 Secondary malignant neoplasm of retroperitoneum and peritoneum: Secondary | ICD-10-CM | POA: Diagnosis not present

## 2022-11-01 LAB — HEPARIN LEVEL (UNFRACTIONATED): Heparin Unfractionated: 0.66 IU/mL (ref 0.30–0.70)

## 2022-11-01 LAB — COMPREHENSIVE METABOLIC PANEL
ALT: 17 U/L (ref 0–44)
AST: 19 U/L (ref 15–41)
Albumin: 1.8 g/dL — ABNORMAL LOW (ref 3.5–5.0)
Alkaline Phosphatase: 80 U/L (ref 38–126)
Anion gap: 11 (ref 5–15)
BUN: 20 mg/dL (ref 6–20)
CO2: 22 mmol/L (ref 22–32)
Calcium: 8.3 mg/dL — ABNORMAL LOW (ref 8.9–10.3)
Chloride: 97 mmol/L — ABNORMAL LOW (ref 98–111)
Creatinine, Ser: 0.5 mg/dL (ref 0.44–1.00)
GFR, Estimated: >60 mL/min (ref >60)
Glucose, Bld: 215 mg/dL — ABNORMAL HIGH (ref 70–99)
Potassium: 4.4 mmol/L (ref 3.5–5.1)
Sodium: 130 mmol/L — ABNORMAL LOW (ref 135–145)
Total Bilirubin: 0.1 mg/dL — ABNORMAL LOW (ref 0.3–1.2)
Total Protein: 5.8 g/dL — ABNORMAL LOW (ref 6.5–8.1)

## 2022-11-01 LAB — GLUCOSE, CAPILLARY
Glucose-Capillary: 202 mg/dL — ABNORMAL HIGH (ref 70–99)
Glucose-Capillary: 200 mg/dL — ABNORMAL HIGH (ref 70–99)
Glucose-Capillary: 178 mg/dL — ABNORMAL HIGH (ref 70–99)
Glucose-Capillary: 200 mg/dL — ABNORMAL HIGH (ref 70–99)
Glucose-Capillary: 181 mg/dL — ABNORMAL HIGH (ref 70–99)

## 2022-11-01 LAB — PHOSPHORUS: Phosphorus: 3.3 mg/dL (ref 2.5–4.6)

## 2022-11-01 LAB — MAGNESIUM: Magnesium: 1.9 mg/dL (ref 1.7–2.4)

## 2022-11-01 LAB — CYTOLOGY - NON PAP

## 2022-11-01 LAB — SURGICAL PATHOLOGY

## 2022-11-01 LAB — CBC
HCT: 23.7 % — ABNORMAL LOW (ref 36.0–46.0)
Hemoglobin: 7.1 g/dL — ABNORMAL LOW (ref 12.0–15.0)
MCH: 22.6 pg — ABNORMAL LOW (ref 26.0–34.0)
MCHC: 30 g/dL (ref 30.0–36.0)
MCV: 75.5 fL — ABNORMAL LOW (ref 80.0–100.0)
Platelets: 427 10*3/uL — ABNORMAL HIGH (ref 150–400)
RBC: 3.14 MIL/uL — ABNORMAL LOW (ref 3.87–5.11)
RDW: 16.2 % — ABNORMAL HIGH (ref 11.5–15.5)
WBC: 9 10*3/uL (ref 4.0–10.5)
nRBC: 0 % (ref 0.0–0.2)

## 2022-11-01 LAB — PREPARE RBC (CROSSMATCH)

## 2022-11-01 LAB — TRIGLYCERIDES: Triglycerides: 95 mg/dL (ref <150)

## 2022-11-01 MED ORDER — TRAVASOL 10 % IV SOLN
INTRAVENOUS | Status: AC
  Filled 2022-11-01: qty 858

## 2022-11-01 MED ORDER — HYDROMORPHONE 1 MG/ML IV SOLN
INTRAVENOUS | Status: AC
  Administered 2022-11-01: 3.26 mg via INTRAVENOUS
  Administered 2022-11-01: 2.68 mg via INTRAVENOUS
  Administered 2022-11-01 – 2022-11-02 (×2): 2.02 mg via INTRAVENOUS
  Administered 2022-11-02: 2.81 mg via INTRAVENOUS
  Administered 2022-11-02: 1.33 mg via INTRAVENOUS
  Administered 2022-11-02: 2.62 mg via INTRAVENOUS
  Administered 2022-11-02: 30 mg via INTRAVENOUS
  Administered 2022-11-02: 2.58 mg via INTRAVENOUS
  Administered 2022-11-02: 1.24 mg via INTRAVENOUS
  Administered 2022-11-02: 2.02 mg via INTRAVENOUS
  Administered 2022-11-03: 4.14 mg via INTRAVENOUS
  Administered 2022-11-03: 2.99 mg via INTRAVENOUS
  Administered 2022-11-03: 1.91 mg via INTRAVENOUS
  Administered 2022-11-03: 2.92 mg via INTRAVENOUS
  Administered 2022-11-03: 1.22 mg via INTRAVENOUS
  Administered 2022-11-04: 3.8 mg via INTRAVENOUS
  Administered 2022-11-04: 2.56 mg via INTRAVENOUS
  Administered 2022-11-04: 3.4 mg via INTRAVENOUS
  Administered 2022-11-04: 1.72 mg via INTRAVENOUS
  Administered 2022-11-04: 2.16 mg via INTRAVENOUS
  Administered 2022-11-04: 30 mg via INTRAVENOUS
  Administered 2022-11-04: 2.96 mg via INTRAVENOUS
  Administered 2022-11-05: 2.48 mg via INTRAVENOUS
  Administered 2022-11-05: 2.81 mg via INTRAVENOUS
  Administered 2022-11-05: 2.85 mg via INTRAVENOUS
  Administered 2022-11-05: 2.09 mg via INTRAVENOUS
  Administered 2022-11-05: 1.15 mg via INTRAVENOUS
  Administered 2022-11-05: 2.66 mg via INTRAVENOUS
  Filled 2022-11-01 (×2): qty 30

## 2022-11-01 MED ORDER — SODIUM CHLORIDE 0.9% IV SOLUTION: Freq: Once | INTRAVENOUS | Status: DC

## 2022-11-01 MED ORDER — PROCHLORPERAZINE EDISYLATE 10 MG/2ML IJ SOLN
10.0000 mg | Freq: Four times a day (QID) | INTRAMUSCULAR | Status: DC | PRN
  Administered 2022-11-01 – 2022-11-14 (×15): 10 mg via INTRAVENOUS
  Filled 2022-11-01 (×16): qty 2

## 2022-11-01 MED ORDER — PREGABALIN 50 MG PO CAPS
50.0000 mg | ORAL_CAPSULE | Freq: Every day | ORAL | Status: DC
  Administered 2022-11-01: 50 mg via ORAL
  Filled 2022-11-01: qty 1

## 2022-11-01 MED ORDER — CLONAZEPAM 0.5 MG PO TABS
0.5000 mg | ORAL_TABLET | Freq: Two times a day (BID) | ORAL | Status: DC
  Administered 2022-11-01 (×2): 0.5 mg via ORAL
  Filled 2022-11-01 (×2): qty 1

## 2022-11-01 NOTE — Progress Notes (Signed)
  Daily Progress Note   Patient Name: Christina [name]       Date: 11/01/2022 DOB: 1976-09-13  Age: 47 y.o. MRN#: [id number] Attending Physician: Shelly Coss, MD Primary Care Physician: [name], MD Admit Date: 10/19/2022 Length of Stay: 12 days  Reason for Consultation/Follow-up: Establishing goals of care and Symptom Management  Subjective:   CC: Following up regarding symptom management.  Subjective: c/o pain down lateral right leg-new. Had loose bowel movement yesterday.  Reviewed EMR prior to seeing patient. PCA started 2/13 24hr Use :  2/13 '40mg'$    2/14 '43mg'$    2/15: '4mg'$  since 8AM-10AM   Patient is receiving TPN for nutritional support.  Still has bloating and also sharp radiating straight through to her back from her abdomen.   Updated bedside RN regarding plan for medication management today.  Review of Systems Abdominal pain Objective:   Vital Signs:  BP 120/80 (BP Location: Left Arm)   Pulse 87   Temp 98.4 F (36.9 C) (Oral)   Resp (!) 22   Ht '4\' 11"'$  (1.499 m)   Wt 67.5 kg   LMP 10/12/2022   SpO2 90%   BMI 30.06 kg/m   Physical Exam: General: Laying in bed, alert, pleasant, appear anxious  Eyes: No discharge noted HENT: moist mucous membranes Cardiovascular: RRR, Respiratory: no increased work of breathing noted, not in respiratory distress Abdomen: distended, tender to palpation Extremities: Moving all extremities appropriately Skin: no rashes or lesions on visible skin Neuro: A&Ox4, following commands easily Psych: appropriately answers all questions  Imaging:  I personally reviewed recent imaging.   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  Palliative medicine team consulted to assist with symptom management.  Recommendations/Plan: # Complex medical decision making/goals of care:  Sister, Gerrit Halls, is listed as NOK in children. Patient noted having multiple children. Has a 21 year old son. Once pain is under control will have further discussion. -  Code Status: Full Code  # Symptom management: Increase basal rate 0.'5mg'$ /hr Maintain current bolus Start Lyrica QHS if able to take PO Start Klonopin May benefit from steroids  # Psychosocial Support: Spiritual care consult  # Discharge Planning:  Will likely need outpatient PM[facility] follow [facility].   Discussed with: patient, bedside RN  Thank you for allowing the palliative care team to participate in the care [name].  Lane Hacker, DO Palliative Medicine   If patient remains symptomatic despite maximum doses, please call PMT at [phone] between 0700 and 1900. Outside of these hours, please call attending, as PMT does not have night coverage.

## 2022-11-01 NOTE — Progress Notes (Signed)
PROGRESS NOTE  CRYSTELLE LANGS  [id number] DOB: [birth year] DOA: 10/19/2022 PCP: [name], MD   Brief Narrative: Patient is a 47 year old female with history of anxiety,HPV, who presented with complaint of abdominal distention, constipation.  CT abdomen/pelvis showed peritoneal carcinomatosis with extensive metastasis, ascites, multiloculated cystic lesion of the pancreatic tail.  GYN oncology consulted initially .  Underwent paracentesis x 3.  Tumor markers are elevated :CA125, CEA. . 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  Oncology, GI following.  S/p  EGD, plan for EUS/biopsy as per GI on Friday.Now started  on TPN. Marland Kitchen  Hospital course remarkable for persistent abdominal pain, distention, constipation, poor oral intake.  Palliative care also following for goals of care.  Oncology started chemotherapy   Assessment & Plan:  Principal Problem:   Peritoneal carcinomatosis ([place]) Active Problems:   S/P cesarean section   Generalized abdominal pain   Constipation   Ovarian mass   Pancreatic mass   Tobacco abuse   Malignant ascites   Protein-calorie malnutrition (HCC)   Cancer associated pain   High risk medication use   Palliative care encounter   Abdominal pain secondary to peritoneal carcinomatosis: Concern of extensive metastatic disease with unknown primary as seen on the CT scan.  CT concerning for bilateral cystic/solid ovarian masses, pancreatic lesion also noted.  CEA, CA125 elevated, CA 19-9 is normal. 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  Medical oncology for palliative care also consulted regarding poor prognosis.  GI consulted, status post EGD on 2/9.  EGD was negative for malignancy in the esophagus, stomach or duodenum but was found to have significant extrinsic compression  in the gastric antrum.  GI planning for endoscopic  ultrasound and biopsy but wanted to wait because abdomen is significantly distended,now plan for Friday Oncology started  chemotherapy .  Underwent Port-A-Cath placement on 2/9. Now on PCA pump.  She had a bowel movement last night.  Abdomen is distended but mostly with gas.  Ascites: Underwent paracentesis twice, first on 2/3 , second on 2/6, 3rd on 2/10.   Continue supportive care.  Started on spironolactone, Lasix.  Will continue to assess  every day if she needs  paracentesis  Hyponatremia: This is most likely contributed by ascites.  Currently spironolactone, Lasix.  Continue to monitor BMP  Constipation/abdominal distention:Abdominal x-ray done multiple times showing ileus, possible partial obstruction.  Currently NPO and on TPN . NG tube placed on 2/12 with return of dark brown fluid but patient was having severe discomfort due to presence of NG tube placement and requesting [place] to remove it.  NG tube removed,on clear liquid diet  PE: Asymptomatic.  CT chest done with contrast for staging purpose showed small embolus on the proximal right lower lobe pulmonary artery.  Started on heparin drip.  Will change Eliquis when appropriate.  She is on room air  Microcytic anemia/iron deficiency/folic acid deficiency: Continue iron supplement, folic acid supplement.  Monitor  hemoglobin.  Currently in the range of 7-8.  Ordered a unit of blood transfusion .  Denies any hematochezia or melena.  Moderate protein calorie malnutrition: Nutritionist consulted and following.   Started  TPN, s/p PICC line  Hypokalemia/hypomagnesemia/hyponatremia: Currently being monitored and supplemented as needed  Hypertension: Continue amlodipine 10 mg , losartan .  Nutrition Problem: Increased nutrient needs Etiology: cancer and cancer related treatments    DVT prophylaxis:Heparin iv     Code Status:  Full Code  Family Communication: Sister at the bedside on 2/9  Patient status: Inpatient  Patient is from :  Home  Anticipated discharge to: unsure now  Estimated DC date: not sure   Consultants: Oncology, GI, palliative care  Procedures: Paracentesis,EGD  Antimicrobials:  Anti-infectives (From admission, onward)    None       Subjective: Patient seen and examined at bedside today.  Hemodynamically stable.  Overall comfortable.  Denies any significant abdominal pain but pain is there.  On PCA pump.  Had bowel movement yesterday.  Passing gas  Objective: Vitals:   11/01/22 0207 11/01/22 0348 11/01/22 0458 11/01/22 0818  BP:   120/80   Pulse: 86  87   Resp: 17 17 17 $ (!) 22  Temp: 98.6 F (37 C)  98.4 F (36.9 C)   TempSrc: Oral  Oral   SpO2: 99% 100% 97% 90%  Weight:      Height:        Intake/Output Summary (Last 24 hours) at 11/01/2022 1158 Last data filed at 11/01/2022 0552 Gross per 24 hour  Intake 1635.1 ml  Output 350 ml  Net 1285.1 ml   Filed Weights   10/26/22 1135 10/28/22 0550 10/31/22 0524  Weight: 69.4 kg 68.1 kg 67.5 kg    Examination:  General exam: Overall comfortable, not in distress,weak,deconditioned HEENT: PERRL Respiratory system:  no wheezes or crackles  Cardiovascular system: S1 & S2 heard, RRR.  Gastrointestinal system: Abdomen is severely distended Central nervous system: Alert and oriented, PICC line Extremities: No edema, no clubbing ,no cyanosis Skin: No rashes, no ulcers,no icterus      Data Reviewed: I have personally reviewed following labs and imaging studies  CBC: Recent Labs  Lab 10/28/22 0103 10/29/22 0415 10/30/22 0509 10/31/22 0520 11/01/22 0512  WBC 8.5 9.0 9.6 7.5 9.0  HGB 8.7* 8.7* 8.0* 7.5* 7.1*  HCT 28.4* 28.3* 26.1* 24.5* 23.7*  MCV 74.3* 74.3* 74.1* 74.2* 75.5*  PLT 506* 564* 476* 426* [id number]*   Basic Metabolic Panel: Recent Labs  Lab 10/28/22 0103 10/29/22 0415 10/30/22 0509 10/31/22 0520 11/01/22 0512  NA 129* 127* 127* 127* 130*  K 3.9 4.5 4.1 4.4 4.4  CL 87* 90* 90* 96* 97*  CO2 29 26 26 23 22   $ GLUCOSE 148* 138* 174* 188* 215*  BUN 21* 25* 25* 26* 20  CREATININE 0.93 0.73 0.64 0.56 0.50  CALCIUM 8.2* 8.0* 7.8* 7.9* 8.3*  MG  --  2.4 2.4 2.3 1.9  PHOS  --  4.6 3.5 4.0 3.3     No results found for this or any previous visit (from the past 240 hour(s)).    Radiology Studies: No results found.  Scheduled Meds:  (feeding supplement) PROSource Plus  30 mL Oral BID BM   sodium chloride   Intravenous Once   amLODipine  10 mg Oral Daily   Chlorhexidine Gluconate Cloth  6 each Topical Daily   clonazePAM  0.5 mg Oral BID   feeding supplement  1 Container Oral TID BM   ferrous sulfate  325 mg Oral Q breakfast   fluorouracil (ADRUCIL) 4,050 mg in dextrose 5 % 1,000 mL chemo infusion  2,400 mg/m2 (Treatment Plan Recorded) Intravenous Once   folic acid  1 mg Oral Daily   furosemide  40 mg Oral Daily   HYDROmorphone   Intravenous Q4H   insulin aspart  0-15 Units Subcutaneous Q4H   losartan  50 mg Oral Daily   polyethylene glycol  17 g  Oral BID   pregabalin  50 mg Oral QHS   senna-docusate  2 tablet Oral QHS   sodium chloride flush  10-40 mL Intracatheter Q12H   spironolactone  25 mg Oral Daily   Continuous Infusions:  heparin 1,800 Units/hr (10/31/22 2324)   TPN ADULT (ION) 65 mL/hr at 11/01/22 0143   TPN ADULT (ION)       LOS: 12 days   Shelly Coss, MD [facility] P2/15/2024, 11:58 AM

## 2022-11-01 NOTE — Progress Notes (Addendum)
PHARMACY - TOTAL PARENTERAL NUTRITION CONSULT NOTE   Indication:  gastric outlet obstruction with extrinsic compression   Patient Measurements: Height: 4' 11"$  (149.9 cm) Weight: 67.5 kg (148 lb 13 oz) IBW/kg (Calculated) : 43.2 TPN AdjBW (KG): 49.8 Body mass index is 30.06 kg/m. 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 Malignant ascites s/p paracentesis x 3. EGD: gastric outlet obstruction w/ extrinsic compression. Pharmacy consulted to manage TPN for nutrition support.   Glucose / Insulin: no hx DM. 18 units SSI used in past 24h. Glucose 169-273 (goal < 150).  Electrolytes:  Na 127>130, Cl low but up to 97. Others, including Corrected Calcium 10, WNL.  Renal: SCr WNL Hepatic: AST, ALT, Alk Phos, Tbili WNL Triglycerides: WNL  2/15 Intake / Output; MIVF: I/O incomplete, unmeasured. IVF: D5W @ 43m/hr when chemotherapy starts - LBM + 2/14 GI Imaging:  2/11 CT abd/pelvis: Multiple dilated loops of small bowel have developed which may represent ileus versus partial or developing small bowel obstruction. Numerous thickened, hyperenhancing loops of distal small bowel, suggestive of infectious or inflammatory enteritis. Abnormal wall thickening of the gastric antrum, which could be secondary to gastritis or malignancy. Large bilateral multi-cystic adnexal masses concerning for primary or metastatic ovarian malignancy. Multiloculated cystic lesion of the pancreatic tail. 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 2/12 abdominal X-ray: Persistent moderate gas distension of large and small bowel suggestive of ileus vs. partial obstruction GI Surgeries / Procedures:  2/9 EGD: gastric outlet obstruction with extrinsic compression  Central access: single lumen PAC placed on 2/9 for chemo, PICC placed on 2/12 for TPN TPN start date: 2/12  Nutritional  Goals: Goal TPN rate is 65 mL/hr (provides 86 g of protein and 1607 kcals per day)  RD Assessment: Estimated Needs Total Energy Estimated Needs: 1500-1700 Total Protein Estimated Needs: 75-90g Total Fluid Estimated Needs: 1.5L/day  Current Nutrition:  TPN Prosource BID: none given Boost/Resource TID:  none given CLD starting 2/14: none recorded  Significant Events: [facility]11[facility] > will start 2/12, Dr ATawanna Solo& RN informed.  Plan:  TPN at goal rate of 65 mL/hr at 1800 Electrolytes in TPN:  Na 1539m/L K 1529mL Ca 5mE61m Mg 2mEq29mPhos 10mmo64mMax chloride Insulin 9 units Add standard MVI and trace elements to TPN Change to Moderate q4h SSI and adjust as needed  Add 1/2 of SSI requirement to TPN now that rate is increased. Will titrate from there. Thiamine 100 mg/day x 5 days (D #4/5), on po FA Monitor TPN labs on Mon/Thurs   Melquisedec Journey S. RobertAlford HighlandmD, BCPS Clinical Staff Pharmacist [email or web address] 11/01/2022 7:18 AM

## 2022-11-01 NOTE — Progress Notes (Signed)
ANTICOAGULATION CONSULT NOTE - Follow Up Consult  Pharmacy Consult for Heparin Indication: pulmonary embolus  Allergies  Allergen Reactions   Claritin [Loratadine] Other (See Comments)    causes spasms   Diphenhydramine Other (See Comments)    Causes spasms and keeps alert for days   Lorazepam Other (See Comments)    After a few doses it made her feel paranoid    Patient Measurements: Height: 4' 11"$  (149.9 cm) Weight: 67.5 kg (148 lb 13 oz) IBW/kg (Calculated) : 43.2 Heparin Dosing Weight: 59 kg  Vital Signs: Temp: 98.4 F (36.9 C) (02/15 0458) Temp Source: Oral (02/15 0458) BP: 120/80 (02/15 0458) Pulse Rate: 87 (02/15 0458)  Labs: Recent Labs    10/30/22 0509 10/30/22 1310 10/30/22 2015 10/31/22 0520 11/01/22 0512  HGB 8.0*  --   --  7.5* 7.1*  HCT 26.1*  --   --  24.5* 23.7*  PLT 476*  --   --  426* 427*  HEPARINUNFRC 0.27*   < > 0.44 0.56 0.66  CREATININE 0.64  --   --  0.56 0.50   < > = values in this interval not displayed.    Estimated Creatinine Clearance: 71.8 mL/min (by C-G formula based on SCr of 0.5 mg/dL).   Assessment: AC/Heme: hep gtt for new small asymptomatic PE - Hep level 0.66 in goal. Hgb 7.1 lower. Plts 427 ok - Microcytic anemia/iron deficiency/folic acid deficiency on po FESO4 for anemia (some doses not given)  Goal of Therapy:  Heparin level 0.3-0.7 units/ml Monitor platelets by anticoagulation protocol: Yes   Plan:  Con't IV heparin at 1800 units/hr Daily HL and CBC Need advisement on if/when to stop IV heparin prior to EUS tomorrow.   Lyne Khurana S. Alford Highland, PharmD, BCPS Clinical Staff Pharmacist [email or web address] Alford Highland, Pacen Watford Stillinger 11/01/2022,7:39 AM

## 2022-11-01 NOTE — Progress Notes (Signed)
Subjective: Receiving chemotherapy.  Objective: Vital signs in last 24 hours: Temp:  [97.7 F (36.5 C)-98.6 F (37 C)] 97.7 F (36.5 C) (02/15 1431) Pulse Rate:  [86-102] 86 (02/15 1431) Resp:  [15-22] 20 (02/15 1431) BP: (119-128)/(72-80) 119/80 (02/15 1431) SpO2:  [90 %-100 %] 100 % (02/15 1431) FiO2 (%):  [21 %] 21 % (02/15 0348) Weight change:  Last BM Date : 10/31/22  PE: GEN:  NAD ABD:  Distended  Lab Results: CBC    Component Value Date/Time   WBC 9.0 11/01/2022 0512   RBC 3.14 (L) 11/01/2022 0512   HGB 7.1 (L) 11/01/2022 0512   HCT 23.7 (L) 11/01/2022 0512   PLT 427 (H) 11/01/2022 0512   MCV 75.5 (L) 11/01/2022 0512   MCH 22.6 (L) 11/01/2022 0512   MCHC 30.0 11/01/2022 0512   RDW 16.2 (H) 11/01/2022 0512   LYMPHSABS 1.9 04/11/2016 1310   MONOABS 0.3 04/11/2016 1310   EOSABS 0.2 04/11/2016 1310   BASOSABS 0.0 04/11/2016 1310  CMP     Component Value Date/Time   NA 130 (L) 11/01/2022 0512   K 4.4 11/01/2022 0512   CL 97 (L) 11/01/2022 0512   CO2 22 11/01/2022 0512   GLUCOSE 215 (H) 11/01/2022 0512   BUN 20 11/01/2022 0512   CREATININE 0.50 11/01/2022 0512   CALCIUM 8.3 (L) 11/01/2022 0512   PROT 5.8 (L) 11/01/2022 0512   ALBUMIN 1.8 (L) 11/01/2022 0512   AST 19 11/01/2022 0512   ALT 17 11/01/2022 0512   ALKPHOS 80 11/01/2022 0512   BILITOT 0.1 (L) 11/01/2022 0512   GFRNONAA >60 11/01/2022 0512   GFRAA >60 12/03/2016 1142   Assessment:   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 Abdominal distention.  Suspect ileus +/- incipient obstruction, less strongly contributing from ascites.  Plan:   EUS planned for tomorrow.  Patient and Dr. Burr Medico aware of limitations of this study in setting of her ascites and abdominal distention, but it is felt that these parameters have been optimized as much as possible. Risks (bleeding, infection, bowel perforation that could require surgery, sedation-related  changes in cardiopulmonary systems), benefits (identification and possible treatment of source of symptoms, exclusion of certain causes of symptoms), and alternatives (watchful waiting, radiographic imaging studies, empiric medical treatment) of upper endoscopy with ultrasound and possible FNA (EUS +/- FNA) were explained to patient/family in detail and patient wishes to proceed.  [facility] will follow.   Landry Dyke 11/01/2022, 2:43 PM   Cell [phone] If no answer or after 5 PM call [phone]

## 2022-11-02 ENCOUNTER — Encounter (HOSPITAL_COMMUNITY)
Admission: EM | Disposition: A | Discharge status: Home / Self Care | Payer: Self-pay | Source: Home / Self Care | Attending: Internal Medicine

## 2022-11-02 ENCOUNTER — Inpatient Hospital Stay (HOSPITAL_COMMUNITY): Payer: Medicaid Other

## 2022-11-02 DIAGNOSIS — C786 Secondary malignant neoplasm of retroperitoneum and peritoneum: Secondary | ICD-10-CM | POA: Diagnosis not present

## 2022-11-02 LAB — CYTOLOGY - PAP
Comment: NEGATIVE
High risk HPV: NEGATIVE

## 2022-11-02 LAB — GLUCOSE, CAPILLARY
Glucose-Capillary: 139 mg/dL — ABNORMAL HIGH (ref 70–99)
Glucose-Capillary: 150 mg/dL — ABNORMAL HIGH (ref 70–99)
Glucose-Capillary: 169 mg/dL — ABNORMAL HIGH (ref 70–99)
Glucose-Capillary: 176 mg/dL — ABNORMAL HIGH (ref 70–99)
Glucose-Capillary: 179 mg/dL — ABNORMAL HIGH (ref 70–99)
Glucose-Capillary: 180 mg/dL — ABNORMAL HIGH (ref 70–99)
Glucose-Capillary: 183 mg/dL — ABNORMAL HIGH (ref 70–99)

## 2022-11-02 LAB — CBC
HCT: 24.1 % — ABNORMAL LOW (ref 36.0–46.0)
Hemoglobin: 7.2 g/dL — ABNORMAL LOW (ref 12.0–15.0)
MCH: 22.4 pg — ABNORMAL LOW (ref 26.0–34.0)
MCHC: 29.9 g/dL — ABNORMAL LOW (ref 30.0–36.0)
MCV: 74.8 fL — ABNORMAL LOW (ref 80.0–100.0)
Platelets: 380 10*3/uL (ref 150–400)
RBC: 3.22 MIL/uL — ABNORMAL LOW (ref 3.87–5.11)
RDW: 16.1 % — ABNORMAL HIGH (ref 11.5–15.5)
WBC: 7.9 10*3/uL (ref 4.0–10.5)
nRBC: 0 % (ref 0.0–0.2)

## 2022-11-02 LAB — HEPARIN LEVEL (UNFRACTIONATED): Heparin Unfractionated: 0.87 IU/mL — ABNORMAL HIGH (ref 0.30–0.70)

## 2022-11-02 SURGERY — CANCELLED PROCEDURE

## 2022-11-02 MED ORDER — LIDOCAINE HCL 1 % IJ SOLN
INTRAMUSCULAR | Status: AC
  Administered 2022-11-02: 12 mL
  Filled 2022-11-02: qty 20

## 2022-11-02 MED ORDER — ENOXAPARIN SODIUM 80 MG/0.8ML IJ SOSY
1.0000 mg/kg | PREFILLED_SYRINGE | Freq: Two times a day (BID) | INTRAMUSCULAR | Status: DC
  Administered 2022-11-02 – 2022-11-07 (×10): 67.5 mg via SUBCUTANEOUS
  Filled 2022-11-02 (×10): qty 0.8

## 2022-11-02 MED ORDER — METOCLOPRAMIDE HCL 5 MG/ML IJ SOLN
5.0000 mg | Freq: Four times a day (QID) | INTRAMUSCULAR | Status: DC
  Administered 2022-11-02 – 2022-11-05 (×11): 5 mg via INTRAVENOUS
  Filled 2022-11-02 (×11): qty 2

## 2022-11-02 MED ORDER — CLONAZEPAM 0.125 MG PO TBDP
0.2500 mg | ORAL_TABLET | Freq: Two times a day (BID) | ORAL | Status: DC
  Administered 2022-11-03 – 2022-11-17 (×28): 0.25 mg via ORAL
  Filled 2022-11-02 (×29): qty 2

## 2022-11-02 MED ORDER — PREGABALIN 25 MG PO CAPS
25.0000 mg | ORAL_CAPSULE | Freq: Every day | ORAL | Status: DC
  Administered 2022-11-03 – 2022-11-14 (×11): 25 mg via ORAL
  Filled 2022-11-02 (×12): qty 1

## 2022-11-02 MED ORDER — DEXAMETHASONE SODIUM PHOSPHATE 4 MG/ML IJ SOLN
4.0000 mg | Freq: Every day | INTRAMUSCULAR | Status: DC
  Administered 2022-11-02 – 2022-11-15 (×14): 4 mg via INTRAVENOUS
  Filled 2022-11-02 (×15): qty 1

## 2022-11-02 MED ORDER — TRAVASOL 10 % IV SOLN
INTRAVENOUS | Status: AC
  Filled 2022-11-02: qty 858

## 2022-11-02 MED ORDER — PANTOPRAZOLE SODIUM 40 MG IV SOLR
40.0000 mg | INTRAVENOUS | Status: DC
  Administered 2022-11-02 – 2022-11-16 (×15): 40 mg via INTRAVENOUS
  Filled 2022-11-02 (×15): qty 10

## 2022-11-02 SURGICAL SUPPLY — 15 items

## 2022-11-02 NOTE — Progress Notes (Signed)
Christina [name]  [id number] DOB: [birth year] DOA: 10/19/2022 PCP: [name], MD   Brief Narrative: Patient is a 47 year old female with history of anxiety,HPV, who presented with complaint of abdominal distention, constipation.  CT abdomen/pelvis showed peritoneal carcinomatosis with extensive metastasis, ascites, multiloculated cystic lesion of the pancreatic tail.  GYN oncology consulted initially .  Underwent paracentesis x 3.  Tumor markers are elevated :CA125, CEA. . 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  Oncology, GI following.  S/p  EGD, plan for EUS/biopsy as per GI but has been postponed.Started  on TPN. Marland Kitchen  Hospital course remarkable for persistent abdominal pain, distention, constipation, poor oral intake.  Palliative care also following for goals of care.  Oncology started chemotherapy   Assessment & Plan:  Principal Problem:   Peritoneal carcinomatosis ([place]) Active Problems:   S/P cesarean section   Generalized abdominal pain   Constipation   Ovarian mass   Pancreatic mass   Tobacco abuse   Malignant ascites   Protein-calorie malnutrition (HCC)   Cancer associated pain   High risk medication use   Palliative care encounter   Abdominal pain secondary to peritoneal carcinomatosis: Concern of extensive metastatic disease with unknown primary as seen on the CT scan.  CT concerning for bilateral cystic/solid ovarian masses, pancreatic lesion also noted.  CEA, CA125 elevated, CA 19-9 is normal. 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  Medical oncology for palliative care also consulted regarding poor prognosis.  GI consulted, status post EGD on 2/9.  EGD was negative for malignancy in the esophagus, stomach or duodenum but was found to have significant extrinsic compression  in the gastric antrum.  GI planning for  endoscopic ultrasound and biopsy but wanted to wait because abdomen is significantly distended. Oncology started  chemotherapy .  Underwent Port-A-Cath placement on 2/9. Now on PCA pump.   Ascites: Underwent paracentesis twice, first on 2/3 , second on 2/6, 3rd on 2/10.   Continue supportive care.  Started on spironolactone, Lasix. Needs repeat paracentesis today  Hyponatremia: This is most likely contributed by ascites.  Currently spironolactone, Lasix.  Continue to monitor BMP  Constipation/abdominal distention:Abdominal x-ray done multiple times showing ileus, possible partial obstruction.   . NG tube placed on 2/12 with return of dark brown fluid but patient was having severe discomfort due to presence of NG tube placement and requesting [place] to remove it.  NG tube removed,on clear liquid diet. Checking abdominal x-ray today  PE: Asymptomatic.  CT chest done with contrast for staging purpose showed small embolus on the proximal right lower lobe pulmonary artery.  Started on heparin drip.  Will change Eliquis when appropriate.  She is on room air  Microcytic anemia/iron deficiency/folic acid deficiency: Continue iron supplement, folic acid supplement.  Monitor  hemoglobin.  Currently in the range of 7-8.  Ordered a unit of blood transfusion,will get after completion of chemo today.  Denies any hematochezia or melena.  Moderate protein calorie malnutrition: Nutritionist consulted and following.   Started  TPN, s/p PICC line  Hypokalemia/hypomagnesemia/hyponatremia: Currently being monitored and supplemented as needed  Hypertension: Continue amlodipine 10 mg , losartan .  Nutrition Problem: Increased nutrient needs Etiology: cancer and cancer related treatments    DVT prophylaxis:Heparin iv     Code Status: Full Code  Family Communication: Sister at the bedside on 2/16  Patient status: Inpatient  Patient is from : Home  Anticipated discharge to: unsure now  Estimated DC date:  not sure   Consultants: Oncology, GI, palliative care  Procedures: Paracentesis,EGD  Antimicrobials:  Anti-infectives (From admission, onward)    None       Subjective: Patient seen and examined at bedside today.  She has more distention of the abdomen today.  Passed some gas this morning.  No bowel movement for last 2 days.  She is requesting for paracentesis.  Objective: Vitals:   11/02/22 0103 11/02/22 0500 11/02/22 0519 11/02/22 1000  BP:  123/78    Pulse:  (!) 105    Resp: 14 17 18 $ (!) 23  Temp:  98.7 F (37.1 C)    TempSrc:  Oral    SpO2: 95% 99% 96% 94%  Weight:      Height:        Intake/Output Summary (Last 24 hours) at 11/02/2022 1149 Last data filed at 11/02/2022 0630 Gross per 24 hour  Intake 2427.41 ml  Output 1800 ml  Net 627.41 ml   Filed Weights   10/26/22 1135 10/28/22 0550 10/31/22 0524  Weight: 69.4 kg 68.1 kg 67.5 kg    Examination:  General exam: Very weak, deconditioned HEENT: PERRL Respiratory system:  no wheezes or crackles  Cardiovascular system: S1 & S2 heard, RRR.  Gastrointestinal system: Abdomen is severely extended , bowel sounds not heard that much  Central nervous system: Alert and oriented Extremities: No edema, no clubbing ,no cyanosis.picc line Skin: No rashes, no ulcers,no icterus       Data Reviewed: I have personally reviewed following labs and imaging studies  CBC: Recent Labs  Lab 10/29/22 0415 10/30/22 0509 10/31/22 0520 11/01/22 0512 11/02/22 0521  WBC 9.0 9.6 7.5 9.0 7.9  HGB 8.7* 8.0* 7.5* 7.1* 7.2*  HCT 28.3* 26.1* 24.5* 23.7* 24.1*  MCV 74.3* 74.1* 74.2* 75.5* 74.8*  PLT 564* 476* 426* 427* [id number]   Basic Metabolic Panel: Recent Labs  Lab 10/28/22 0103 10/29/22 0415 10/30/22 0509 10/31/22 0520 11/01/22 0512  NA 129* 127* 127* 127* 130*  K 3.9 4.5 4.1 4.4 4.4  CL 87* 90* 90* 96* 97*  CO2 29 26 26 23 22  $ GLUCOSE 148* 138* 174* 188* 215*  BUN 21* 25* 25* 26* 20  CREATININE 0.93 0.73 0.64 0.56  0.50  CALCIUM 8.2* 8.0* 7.8* 7.9* 8.3*  MG  --  2.4 2.4 2.3 1.9  PHOS  --  4.6 3.5 4.0 3.3     No results found for this or any previous visit (from the past 240 hour(s)).    Radiology Studies: No results found.  Scheduled Meds:  (feeding supplement) PROSource Plus  30 mL Oral BID BM   sodium chloride   Intravenous Once   amLODipine  10 mg Oral Daily   Chlorhexidine Gluconate Cloth  6 each Topical Daily   clonazePAM  0.5 mg Oral BID   feeding supplement  1 Container Oral TID BM   ferrous sulfate  325 mg Oral Q breakfast   fluorouracil (ADRUCIL) 4,050 mg in dextrose 5 % 1,000 mL chemo infusion  2,400 mg/m2 (Treatment Plan Recorded) Intravenous Once   folic acid  1 mg Oral Daily   furosemide  40 mg Oral Daily   HYDROmorphone   Intravenous Q4H   insulin aspart  0-15 Units Subcutaneous Q4H   losartan  50 mg Oral Daily   polyethylene glycol  17 g Oral BID   pregabalin  50 mg Oral QHS   senna-docusate  2 tablet Oral  QHS   sodium chloride flush  10-40 mL Intracatheter Q12H   spironolactone  25 mg Oral Daily   Continuous Infusions:  heparin Stopped (11/02/22 0724)   TPN ADULT (ION) 65 mL/hr at 11/02/22 0224   TPN ADULT (ION)       LOS: 13 days   Shelly Coss, MD [facility] P2/16/2024, 11:49 AM

## 2022-11-02 NOTE — Procedures (Signed)
Ultrasound-guided  therapeutic paracentesis performed yielding 3.5 liters of yellow  fluid. No immediate complications. EBL none.

## 2022-11-02 NOTE — Progress Notes (Signed)
PHARMACY - TOTAL PARENTERAL NUTRITION CONSULT NOTE   Indication:  gastric outlet obstruction with extrinsic compression   Patient Measurements: Height: 4' 11"$  (149.9 cm) Weight: 67.5 kg (148 lb 13 oz) IBW/kg (Calculated) : 43.2 TPN AdjBW (KG): 49.8 Body mass index is 30.06 kg/m. 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 Malignant ascites s/p paracentesis x 3. EGD: gastric outlet obstruction w/ extrinsic compression. Pharmacy consulted to manage TPN for nutrition support.   Glucose / Insulin: no hx DM. 18 units SSI used in past 24h. Glucose 176-200 (goal < 150).  Electrolytes:  Na 127>130, Cl low but up to 97. Others, including Corrected Calcium 10, WNL. No new BMET 2/17 Renal: SCr WNL Hepatic: AST, ALT, Alk Phos, Tbili WNL Triglycerides: WNL  2/15 Intake / Output; MIVF: I/O incomplete, unmeasured. IVF: D5W @ 32m/hr when chemotherapy starts - LBM + 2/14 GI Imaging:  2/11 CT abd/pelvis: Multiple dilated loops of small bowel have developed which may represent ileus versus partial or developing small bowel obstruction. Numerous thickened, hyperenhancing loops of distal small bowel, suggestive of infectious or inflammatory enteritis. Abnormal wall thickening of the gastric antrum, which could be secondary to gastritis or malignancy. Large bilateral multi-cystic adnexal masses concerning for primary or metastatic ovarian malignancy. Multiloculated cystic lesion of the pancreatic tail. 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 2/12 abdominal X-ray: Persistent moderate gas distension of large and small bowel suggestive of ileus vs. partial obstruction GI Surgeries / Procedures:  2/9 EGD: gastric outlet obstruction with extrinsic compression  Central access: single lumen PAC placed on 2/9 for chemo, PICC placed on 2/12 for TPN TPN start date:  2/12  Nutritional Goals: Goal TPN rate is 65 mL/hr (provides 86 g of protein and 1607 kcals per day)  RD Assessment: Estimated Needs Total Energy Estimated Needs: 1500-1700 Total Protein Estimated Needs: 75-90g Total Fluid Estimated Needs: 1.5L/day  Current Nutrition:  TPN Prosource BID: none given Boost/Resource TID:  none given CLD starting 2/14: none recorded  Significant Events: 2/11[facility] > will start 2/12, Dr ATawanna Solo& RN informed.  Plan:  TPN at goal rate of 65 mL/hr at 1800 Electrolytes in TPN:  Na 1559m/L K 1588mL Ca 5mE42m Mg 2mEq72mPhos 10mmo24mMax chloride Incr Insulin 18 units in bag Add standard MVI and trace elements to TPN Change to Moderate q4h SSI and adjust as needed  Thiamine 100 mg/day x 5 days (D #5/5), on PO FA Monitor TPN labs on Mon/Thurs and prn   Noe Goyer S. RobertAlford HighlandmD, BCPS Clinical Staff Pharmacist [email or web address] 11/02/2022 7:12 AM

## 2022-11-02 NOTE — Progress Notes (Signed)
ANTICOAGULATION CONSULT NOTE - Follow Up Consult  Pharmacy Consult for Heparin Indication: pulmonary embolus  Allergies  Allergen Reactions   Claritin [Loratadine] Other (See Comments)    causes spasms   Diphenhydramine Other (See Comments)    Causes spasms and keeps alert for days   Lorazepam Other (See Comments)    After a few doses it made her feel paranoid    Patient Measurements: Height: 4' 11"$  (149.9 cm) Weight: 67.5 kg (148 lb 13 oz) IBW/kg (Calculated) : 43.2 Heparin Dosing Weight: 59 kg  Vital Signs: Temp: 98.4 F (36.9 C) (02/15 2039) Temp Source: Oral (02/15 2039) BP: 121/84 (02/15 2039) Pulse Rate: 98 (02/15 2039)  Labs: Recent Labs    10/31/22 0520 11/01/22 0512 11/02/22 0521  HGB 7.5* 7.1* 7.2*  HCT 24.5* 23.7* 24.1*  PLT 426* 427* 380  HEPARINUNFRC 0.56 0.66 0.87*  CREATININE 0.56 0.50  --      Estimated Creatinine Clearance: 71.8 mL/min (by C-G formula based on SCr of 0.5 mg/dL).   Assessment: AC/Heme: hep gtt for new small asymptomatic PE - Hep level 0.87 (supratherapeutic) with heparin gtt @ 1800 untis/hr. Hgb 7.2  Plts 380  - Microcytic anemia/iron deficiency/folic acid deficiency on po FESO4 for anemia (some doses not given) - RN confirmed no line issues.  No bleeding reported  Goal of Therapy:  Heparin level 0.3-0.7 units/ml Monitor platelets by anticoagulation protocol: Yes   Plan:  Decrease IV heparin to 1700 units/hr Check heparin level 6 hr after rate decreased Daily HL and CBC Need advisement on if/when to stop IV heparin prior to EUS today.    Everette Rank, PharmD 11/02/2022,6:41 AM

## 2022-11-02 NOTE — Progress Notes (Signed)
Christina [name]   DOB:1976-05-14   [id number]   [id number]  Med/onc follow up note  Subjective patient has been tolerating chemotherapy well, does report worsening abdominal distention and bloating.  Mild intermittent nausea, no vomiting.  She is passing gas, no bowel movement yesterday or today.  Objective:  Vitals:   11/02/22 1643 11/02/22 1700  BP:  118/68  Pulse:  83  Resp: 17   Temp:  98.2 F (36.8 C)  SpO2: 96%     Body mass index is 30.06 kg/m.  Intake/Output Summary (Last 24 hours) at 11/02/2022 1729 Last data filed at 11/02/2022 1700 Gross per 24 hour  Intake 2819.41 ml  Output 2100 ml  Net 719.41 ml     Sclerae unicteric  Oropharynx clear  No peripheral adenopathy  Lungs clear -- no rales or rhonchi  Heart regular rate and rhythm  Abdomen distended with mild diffuse tenderness.  MSK no focal spinal tenderness, no peripheral edema  Neuro nonfocal    CBG (last 3)  Recent Labs    11/02/22 0742 11/02/22 1158 11/02/22 1634  GLUCAP 150* 139* 169*     Labs:   Urine Studies No results for input(s): "UHGB", "CRYS" in the last 72 hours.  Invalid input(s): "UACOL", "UAPR", "USPG", "UPH", "UTP", "UGL", "UKET", "UBIL", "UNIT", "UROB", "ULEU", "UEPI", "UWBC", "URBC", "UBAC", "CAST", "UCOM", "BILUA"  Basic Metabolic Panel: Recent Labs  Lab 10/28/22 0103 10/29/22 0415 10/30/22 0509 10/31/22 0520 11/01/22 0512  NA 129* 127* 127* 127* 130*  K 3.9 4.5 4.1 4.4 4.4  CL 87* 90* 90* 96* 97*  CO2 29 26 26 23 22  $ GLUCOSE 148* 138* 174* 188* 215*  BUN 21* 25* 25* 26* 20  CREATININE 0.93 0.73 0.64 0.56 0.50  CALCIUM 8.2* 8.0* 7.8* 7.9* 8.3*  MG  --  2.4 2.4 2.3 1.9  PHOS  --  4.6 3.5 4.0 3.3   GFR Estimated Creatinine Clearance: 71.8 mL/min (by C-G formula based on SCr of 0.5 mg/dL). Liver Function Tests: Recent Labs  Lab 10/29/22 0415 10/30/22 0509 10/31/22 0520 11/01/22 0512  AST 11* 8* 13* 19  ALT 14 14 15 17  $ ALKPHOS 88 88 89 80  BILITOT 0.4  0.4 0.3 0.1*  PROT 5.7* 5.6* 5.4* 5.8*  ALBUMIN 2.1* 2.1* 1.9* 1.8*   No results for input(s): "LIPASE", "AMYLASE" in the last 168 hours. No results for input(s): "AMMONIA" in the last 168 hours. Coagulation profile No results for input(s): "INR", "PROTIME" in the last 168 hours.   CBC: Recent Labs  Lab 10/29/22 0415 10/30/22 0509 10/31/22 0520 11/01/22 0512 11/02/22 0521  WBC 9.0 9.6 7.5 9.0 7.9  HGB 8.7* 8.0* 7.5* 7.1* 7.2*  HCT 28.3* 26.1* 24.5* 23.7* 24.1*  MCV 74.3* 74.1* 74.2* 75.5* 74.8*  PLT 564* 476* 426* 427* 380   Cardiac Enzymes: No results for input(s): "CKTOTAL", "CKMB", "CKMBINDEX", "TROPONINI" in the last 168 hours. BNP: Invalid input(s): "POCBNP" CBG: Recent Labs  Lab 11/02/22 0056 11/02/22 0510 11/02/22 0742 11/02/22 1158 11/02/22 1634  GLUCAP 180* 176* 150* 139* 169*   D-Dimer No results for input(s): "DDIMER" in the last 72 hours. Hgb A1c No results for input(s): "HGBA1C" in the last 72 hours. Lipid Profile Recent Labs    11/01/22 0512  TRIG 95   Thyroid function studies No results for input(s): "TSH", "T4TOTAL", "T3FREE", "THYROIDAB" in the last 72 hours.  Invalid input(s): "FREET3" Anemia work up No results for input(s): "VITAMINB12", "FOLATE", "FERRITIN", "TIBC", "IRON", "RETICCTPCT" in the last 72  hours. Microbiology No results found for this or any previous visit (from the past 240 hour(s)).     Studies:  US Paracentesis  Result Date: 11/02/2022 INDICATION: Patient with history of peritoneal carcinomatosis, adenocarcinoma from likely upper GI /pancreaticobiliary origin, recurrent ascites, pulmonary embolus, anemia; request received for therapeutic paracentesis. EXAM: ULTRASOUND GUIDED THERAPEUTIC PARACENTESIS MEDICATIONS: 8 ml 1% lidocaine COMPLICATIONS: None immediate. PROCEDURE: Informed written consent was obtained from the patient after a discussion of the risks, benefits and alternatives to treatment. A timeout was performed  prior to the initiation of the procedure. Initial ultrasound scanning demonstrates a moderate amount of ascites within the right lower abdominal quadrant. The right lower abdomen was prepped and draped in the usual sterile fashion. 1% lidocaine was used for local anesthesia. Following this, a 19 gauge, 7-cm, Yueh catheter was introduced. An ultrasound image was saved for documentation purposes. The paracentesis was performed. The catheter was removed and a dressing was applied. The patient tolerated the procedure well without immediate post procedural complication. FINDINGS: A total of approximately 3.5 liters of yellow fluid was removed. IMPRESSION: Successful ultrasound-guided therapeutic paracentesis yielding 3.5 liters of peritoneal fluid. Read by: Rowe Robert, PA-C Electronically Signed   By: Ruthann Cancer M.D.   On: 11/02/2022 16:46   DG Abd 1 View  Result Date: 11/02/2022 CLINICAL DATA:  Abdominal distension. EXAM: ABDOMEN - 1 VIEW COMPARISON:  10/30/2022 FINDINGS: Persistent mildly distended air-filled small bowel loops with scattered air-fluid levels suggesting small bowel obstruction. No free air. The lung bases are grossly clear. IMPRESSION: Mild small-bowel obstruction bowel gas pattern. Electronically Signed   By: Marijo Sanes M.D.   On: 11/02/2022 12:39    Assessment: 47 y.o. 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 Malignant ascites status post paracentesisX3 Recurrent N/V and constipation, secondary to #1, partial bowel obstruction due to the extensive peritoneal metastasis Moderate protein and calorie malnutrition Small PE    Plan:  -Lab reviewed. -She will need paracentesis, this has been ordered by Dr. Tawanna Solo -She may not be able to proceed with EUS today due to worsening abdominal distention -Agree with blood transfusion after chemo. -Continue supportive  care. -I will f/u next Monday   Truitt Merle, MD 11/02/2022

## 2022-11-02 NOTE — Progress Notes (Signed)
Nutrition Follow-up  INTERVENTION:   -TPN management per Pharmacy  -D/c Boost Breeze -patient refusing  NUTRITION DIAGNOSIS:   Increased nutrient needs related to cancer and cancer related treatments as evidenced by estimated needs.  Ongoing.  GOAL:   Patient will meet greater than or equal to 90% of their needs  Meeting with TPN.  MONITOR:   PO intake, Supplement acceptance, I & O's, Labs, Weight trends  ASSESSMENT:   47 y.o. female with medical history significant of anxiety, HPV comes to the hospital at [facility] with complaints of abdominal distention and constipation. CT abdomen pelvis showed peritoneal carcinomatosis with extensive metastases, ascites and multiloculated cystic lesion of the pancreatic tail.  GYN oncology consulted, underwent paracentesis, 3.5 L was removed.  2/2: admitted 2/9: s/p EGD, s/p PAC placement  Patient to have repeat paracentesis.   Patient continues TPN at goal: 65 ml/hr. Providing 1606 kcals and 85g protein.  Admission weight: 153 lbs Current weight: 148 lbs -likely needs repeat NFPE to see if fat or muscle depletions present .  Medications: Ferrous sulfate, Folic acid, Lasix, Reglan, Senokot  Labs reviewed: CBGs: 139-181   Diet Order:   Diet Order             Diet NPO time specified  Diet effective midnight           Diet NPO time specified  Diet effective midnight                   EDUCATION NEEDS:   No education needs have been identified at this time  Skin:  Skin Assessment: Reviewed RN Assessment  Last BM:  2/14 -type 5  Height:   Ht Readings from Last 1 Encounters:  10/26/22 4' 11"$  (1.499 m)    Weight:   Wt Readings from Last 1 Encounters:  10/31/22 67.5 kg    BMI:  Body mass index is 30.06 kg/m.  Estimated Nutritional Needs:   Kcal:  1500-1700  Protein:  75-90g  Fluid:  1.5L/day  Clayton Bibles, MS, RD, LDN Inpatient Clinical Dietitian Contact information available via [employer]ion

## 2022-11-02 NOTE — Progress Notes (Signed)
Reglan 36m TID for nausea and motility PPI for possible malignant obstruction and GI protection Decadron 463mdaily for inflammation and nausea Dulcolax suppository/or Fleets until taking PO fully May consider octreotide for malignant dysmotility/obstruction if no improvement in next 24-48 hours. Allow for sips and ice chips for comfort. No NG tube unless she has vomiting. Maintain PCA until some of her acute issues stabilize usage has been stable for past 24 hours. Reports pain is under much better control.

## 2022-11-02 NOTE — Progress Notes (Signed)
Subjective: Worsening distention.  Objective: Vital signs in last 24 hours: Temp:  [97.7 F (36.5 C)-98.7 F (37.1 C)] 98.7 F (37.1 C) (02/16 0500) Pulse Rate:  [86-105] 105 (02/16 0500) Resp:  [14-23] 23 (02/16 1000) BP: (119-123)/(78-84) 123/78 (02/16 0500) SpO2:  [91 %-100 %] 94 % (02/16 1000) Weight change:  Last BM Date : 10/31/22  PE: GEN:  Chronically ill-appearing, older-appearing than stated age ABD:  Moderate distention, tympany-predominant, worsening.  Lab Results: CBC    Component Value Date/Time   WBC 7.9 11/02/2022 0521   RBC 3.22 (L) 11/02/2022 0521   HGB 7.2 (L) 11/02/2022 0521   HCT 24.1 (L) 11/02/2022 0521   PLT 380 11/02/2022 0521   MCV 74.8 (L) 11/02/2022 0521   MCH 22.4 (L) 11/02/2022 0521   MCHC 29.9 (L) 11/02/2022 0521   RDW 16.1 (H) 11/02/2022 0521   LYMPHSABS 1.9 04/11/2016 1310   MONOABS 0.3 04/11/2016 1310   EOSABS 0.2 04/11/2016 1310   BASOSABS 0.0 04/11/2016 1310  CMP     Component Value Date/Time   NA 130 (L) 11/01/2022 0512   K 4.4 11/01/2022 0512   CL 97 (L) 11/01/2022 0512   CO2 22 11/01/2022 0512   GLUCOSE 215 (H) 11/01/2022 0512   BUN 20 11/01/2022 0512   CREATININE 0.50 11/01/2022 0512   CALCIUM 8.3 (L) 11/01/2022 0512   PROT 5.8 (L) 11/01/2022 0512   ALBUMIN 1.8 (L) 11/01/2022 0512   AST 19 11/01/2022 0512   ALT 17 11/01/2022 0512   ALKPHOS 80 11/01/2022 0512   BILITOT 0.1 (L) 11/01/2022 0512   GFRNONAA >60 11/01/2022 0512   GFRAA >60 12/03/2016 1142   Assessment:  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 Abdominal distention.  Suspect ileus +/- incipient obstruction, less strongly contributing from ascites.  Plan:   Given patient's interval distention with interval increase in abdominal discomfort, I am concerned either about worsening ascites, worsening ileus or incipient obstruction.  Patient is also still receiving her chemotherapy infusion. Neither patient  nor I feel comfortable with proceeding with EUS at this time.  EUS today has been canceled.  I think the yield will be low (especially due to increased bowel gas) and the risks high.  Patient understands and wants to hold off on EUS at this time. Paracentesis today has been ordered by [facility]; if this is unrevealing, or not significant ascites, patient might benefit from repeat abd xray +/- CT scan. At this point, I am not sure how feasible doing the EUS is going to be, at least in the foreseeable future. [facility] will follow.   Landry Dyke 11/02/2022, 11:29 AM   Cell [phone] If no answer or after 5 PM call [phone]

## 2022-11-02 NOTE — Anesthesia Preprocedure Evaluation (Signed)
Anesthesia Evaluation    History of Anesthesia Complications (+) PONV and history of anesthetic complications  Airway        Dental   Pulmonary Current Smoker          Cardiovascular hypertension,      Neuro/Psych Seizures -, Well Controlled,   Anxiety Depression       GI/Hepatic Neg liver ROS,,,Pancreatic mass   Endo/Other  diabetes    Renal/GU negative Renal ROS  negative genitourinary   Musculoskeletal negative musculoskeletal ROS (+)    Abdominal   Peds  Hematology negative hematology ROS (+)   Anesthesia Other Findings   Reproductive/Obstetrics                             Anesthesia Physical Anesthesia Plan  ASA: 3  Anesthesia Plan: MAC   Post-op Pain Management:    Induction: Intravenous  PONV Risk Score and Plan: 2 and Propofol infusion and Treatment may vary due to age or medical condition  Airway Management Planned: Simple Face Mask, Natural Airway and Nasal Cannula  Additional Equipment: None  Intra-op Plan:   Post-operative Plan:   Informed Consent:   Plan Discussed with:   Anesthesia Plan Comments:        Anesthesia Quick Evaluation

## 2022-11-03 DIAGNOSIS — R1084 Generalized abdominal pain: Secondary | ICD-10-CM | POA: Diagnosis not present

## 2022-11-03 DIAGNOSIS — C786 Secondary malignant neoplasm of retroperitoneum and peritoneum: Secondary | ICD-10-CM | POA: Diagnosis not present

## 2022-11-03 DIAGNOSIS — K5900 Constipation, unspecified: Secondary | ICD-10-CM | POA: Diagnosis not present

## 2022-11-03 DIAGNOSIS — N838 Other noninflammatory disorders of ovary, fallopian tube and broad ligament: Secondary | ICD-10-CM | POA: Diagnosis not present

## 2022-11-03 LAB — TYPE AND SCREEN
ABO/RH(D): A POS
Antibody Screen: NEGATIVE
Unit division: 0

## 2022-11-03 LAB — BPAM RBC
Blood Product Expiration Date: 202403092359
ISSUE DATE / TIME: 202402161334
Unit Type and Rh: 6200

## 2022-11-03 LAB — CBC
HCT: 27.4 % — ABNORMAL LOW (ref 36.0–46.0)
Hemoglobin: 8.6 g/dL — ABNORMAL LOW (ref 12.0–15.0)
MCH: 24.1 pg — ABNORMAL LOW (ref 26.0–34.0)
MCHC: 31.4 g/dL (ref 30.0–36.0)
MCV: 76.8 fL — ABNORMAL LOW (ref 80.0–100.0)
Platelets: 265 10*3/uL (ref 150–400)
RBC: 3.57 MIL/uL — ABNORMAL LOW (ref 3.87–5.11)
RDW: 17.6 % — ABNORMAL HIGH (ref 11.5–15.5)
WBC: 6.6 10*3/uL (ref 4.0–10.5)
nRBC: 0 % (ref 0.0–0.2)

## 2022-11-03 LAB — GLUCOSE, CAPILLARY
Glucose-Capillary: 193 mg/dL — ABNORMAL HIGH (ref 70–99)
Glucose-Capillary: 179 mg/dL — ABNORMAL HIGH (ref 70–99)
Glucose-Capillary: 181 mg/dL — ABNORMAL HIGH (ref 70–99)
Glucose-Capillary: 197 mg/dL — ABNORMAL HIGH (ref 70–99)
Glucose-Capillary: 144 mg/dL — ABNORMAL HIGH (ref 70–99)

## 2022-11-03 LAB — BASIC METABOLIC PANEL
Anion gap: 8 (ref 5–15)
BUN: 19 mg/dL (ref 6–20)
CO2: 23 mmol/L (ref 22–32)
Calcium: 7.8 mg/dL — ABNORMAL LOW (ref 8.9–10.3)
Chloride: 99 mmol/L (ref 98–111)
Creatinine, Ser: 0.35 mg/dL — ABNORMAL LOW (ref 0.44–1.00)
GFR, Estimated: >60 mL/min (ref >60)
Glucose, Bld: 232 mg/dL — ABNORMAL HIGH (ref 70–99)
Potassium: 3.9 mmol/L (ref 3.5–5.1)
Sodium: 130 mmol/L — ABNORMAL LOW (ref 135–145)

## 2022-11-03 LAB — HEPARIN LEVEL (UNFRACTIONATED): Heparin Unfractionated: 0.22 IU/mL — ABNORMAL LOW (ref 0.30–0.70)

## 2022-11-03 MED ORDER — FUROSEMIDE 40 MG PO TABS: 40.0000 mg | ORAL_TABLET | Freq: Every day | ORAL | Status: DC

## 2022-11-03 MED ORDER — POLYETHYLENE GLYCOL 3350 17 G PO PACK
17.0000 g | PACK | Freq: Three times a day (TID) | ORAL | Status: DC
  Administered 2022-11-04 – 2022-11-13 (×3): 17 g via ORAL
  Filled 2022-11-03 (×9): qty 1

## 2022-11-03 MED ORDER — FUROSEMIDE 10 MG/ML IJ SOLN: 40.0000 mg | Freq: Every day | INTRAMUSCULAR | Status: DC

## 2022-11-03 MED ORDER — TRAVASOL 10 % IV SOLN
INTRAVENOUS | Status: AC
  Filled 2022-11-03: qty 858

## 2022-11-03 NOTE — Progress Notes (Signed)
PROGRESS NOTE    Christina [name]  [id number] DOB: 01-Dec-1975 DOA: 10/19/2022 PCP: [name], MD   Brief Narrative:  Patient is a 47 year old female with history of anxiety,HPV, who presented with complaint of abdominal distention, constipation.  CT abdomen/pelvis showed peritoneal carcinomatosis with extensive metastasis, ascites, multiloculated cystic lesion of the pancreatic tail.  GYN oncology consulted initially .  Underwent paracentesis x 3.  Tumor markers are elevated :CA125, CEA. . 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  Oncology, GI following.  S/p  EGD, plan for EUS/biopsy as per GI but has been postponed.Started  on TPN. Hospital course remarkable for persistent abdominal pain, distention, constipation, poor oral intake.  Palliative care also following for goals of care.  Oncology started chemotherapy   Assessment & Plan:   Abdominal pain secondary to metastatic adenocarcinoma with peritoneal carcinomatosis:  -Concern of extensive metastatic disease with unknown primary as seen on the CT scan.  CT concerning for bilateral cystic/solid ovarian masses, pancreatic lesion also noted.  CEA, CA125 elevated, CA 19-9 is normal. -Cytology report from paracentesis showed adenocarcinoma most likely from upper GI or pancreaticobiliary origin.   -Medical oncology for palliative care also consulted regarding poor prognosis.   -GI consulted, status post EGD on 2/9.   -EGD was negative for malignancy in the esophagus, stomach or duodenum but was found to have significant extrinsic compression  in the gastric antrum.  GI planning for endoscopic ultrasound and biopsy but wanted to wait because abdomen is significantly distended.  May need outpatient EUS -Oncology started  chemotherapy .  Underwent Port-A-Cath placement on 2/9. -Now on PCA pump.  -Palliative on board-recommend Reglan 5 mg 3 times daily, PPI, Decadron 4 mg for  inflammation and nausea.  No NG unless she has vomiting.   Malignant ascites:  -Underwent paracentesis on 2/3 , 2/6,  2/10, 2/16.   Continue supportive care.   -cont. spironolactone, Lasix.    Hyponatremia: Continue to monitor  Constipation/abdominal distention:Abdominal x-ray done multiple times showing ileus, possible partial obstruction.  -NG tube placed on 2/12 however later close removed due to patient's discomfort -Abdomen x-ray from 1/16 shows mild small bowel obstruction bowel gas pattern.  -No bowel movement since Thursday.  GI increase MiraLAX to 3 times daily  PE:  -Asymptomatic.  CT chest done with contrast for staging purpose showed small embolus on the proximal right lower lobe pulmonary artery.  Currently on full dose Lovenox.  Will change Eliquis when appropriate.  She is on room air   Microcytic anemia/iron deficiency/folic acid deficiency:  -Continue iron supplement, folic acid supplement.  Monitor  hemoglobin.  Currently in the range of 7-8.  S/p 1 unit PRBC transfusion on 2/16.  H&H is stable this a.m.   -Denies any hematochezia or melena.   Moderate protein calorie malnutrition: Nutritionist consulted and following.   Started  TPN, s/p PICC line   Hypokalemia/hypomagnesemia Currently being monitored and supplemented as needed   Hypertension: On amlodipine 10 mg , losartan  -Continue to monitor  DVT prophylaxis: Full dose Lovenox  code Status: Full code Family Communication:  None present at bedside.  Plan of care discussed with patient in length and she verbalized understanding and agreed with it. Disposition Plan: To be determined  Consultants:  GI Oncology Palliative care  Procedures:  Status) EGD   Status is: Inpatient    Subjective: Patient seen and examined.  Reports that after paracentesis she feels better.  Not passing  gas or stool.  No bowel movement since she had chemo.  No fever, chills.  No nausea or vomiting.  No acute events  overnight.  Objective: Vitals:   11/03/22 0647 11/03/22 0846 11/03/22 1137 11/03/22 1346  BP: 139/82   136/82  Pulse: 100   (!) 102  Resp: 14 20 16 20  $ Temp: 98.3 F (36.8 C)   98.4 F (36.9 C)  TempSrc: Oral   Oral  SpO2: 100% 98% 97% 97%  Weight:      Height:        Intake/Output Summary (Last 24 hours) at 11/03/2022 1444 Last data filed at 11/03/2022 0654 Gross per 24 hour  Intake 1867.32 ml  Output 2200 ml  Net -332.68 ml   Filed Weights   10/28/22 0550 10/31/22 0524 11/03/22 0500  Weight: 68.1 kg 67.5 kg 66.1 kg    Examination:  General exam: Appears calm and comfortable, on nasal cannula, communicating well Respiratory system: Clear to auscultation. Respiratory effort normal. Cardiovascular system: S1 & S2 heard, RRR. No JVD, murmurs, rubs, gallops or clicks. No pedal edema. Gastrointestinal system: Abdomen is distended, soft and nontender. No organomegaly or masses felt.  Hypoactive bowel sounds.   Central nervous system: Alert and oriented. No focal neurological deficits. Extremities: Symmetric 5 x 5 power. Skin: No rashes, lesions or ulcers Psychiatry: Judgement and insight appear normal. Mood & affect appropriate.    Data Reviewed: I have personally reviewed following labs and imaging studies  CBC: Recent Labs  Lab 10/30/22 0509 10/31/22 0520 11/01/22 0512 11/02/22 0521 11/03/22 0345  WBC 9.6 7.5 9.0 7.9 6.6  HGB 8.0* 7.5* 7.1* 7.2* 8.6*  HCT 26.1* 24.5* 23.7* 24.1* 27.4*  MCV 74.1* 74.2* 75.5* 74.8* 76.8*  PLT 476* 426* 427* 380 [id number]   Basic Metabolic Panel: Recent Labs  Lab 10/29/22 0415 10/30/22 0509 10/31/22 0520 11/01/22 0512 11/03/22 0345  NA 127* 127* 127* 130* 130*  K 4.5 4.1 4.4 4.4 3.9  CL 90* 90* 96* 97* 99  CO2 26 26 23 22 23  $ GLUCOSE 138* 174* 188* 215* 232*  BUN 25* 25* 26* 20 19  CREATININE 0.73 0.64 0.56 0.50 0.35*  CALCIUM 8.0* 7.8* 7.9* 8.3* 7.8*  MG 2.4 2.4 2.3 1.9  --   PHOS 4.6 3.5 4.0 3.3  --    GFR: Estimated  Creatinine Clearance: 71.1 mL/min (A) (by C-G formula based on SCr of 0.35 mg/dL (L)). Liver Function Tests: Recent Labs  Lab 10/29/22 0415 10/30/22 0509 10/31/22 0520 11/01/22 0512  AST 11* 8* 13* 19  ALT 14 14 15 17  $ ALKPHOS 88 88 89 80  BILITOT 0.4 0.4 0.3 0.1*  PROT 5.7* 5.6* 5.4* 5.8*  ALBUMIN 2.1* 2.1* 1.9* 1.8*   No results for input(s): "LIPASE", "AMYLASE" in the last 168 hours. No results for input(s): "AMMONIA" in the last 168 hours. Coagulation Profile: No results for input(s): "INR", "PROTIME" in the last 168 hours. Cardiac Enzymes: No results for input(s): "CKTOTAL", "CKMB", "CKMBINDEX", "TROPONINI" in the last 168 hours. BNP (last 3 results) No results for input(s): "PROBNP" in the last 8760 hours. HbA1C: No results for input(s): "HGBA1C" in the last 72 hours. CBG: Recent Labs  Lab 11/02/22 2025 11/02/22 2344 11/03/22 0357 11/03/22 0756 11/03/22 1202  GLUCAP 179* 183* 193* 179* 181*   Lipid Profile: Recent Labs    11/01/22 0512  TRIG 95   Thyroid Function Tests: No results for input(s): "TSH", "T4TOTAL", "FREET4", "T3FREE", "THYROIDAB" in the last 72 hours. Anemia  Panel: No results for input(s): "VITAMINB12", "FOLATE", "FERRITIN", "TIBC", "IRON", "RETICCTPCT" in the last 72 hours. Sepsis Labs: No results for input(s): "PROCALCITON", "LATICACIDVEN" in the last 168 hours.  No results found for this or any previous visit (from the past 240 hour(s)).    Radiology Studies: US Paracentesis  Result Date: 11/02/2022 INDICATION: Patient with history of peritoneal carcinomatosis, adenocarcinoma from likely upper GI /pancreaticobiliary origin, recurrent ascites, pulmonary embolus, anemia; request received for therapeutic paracentesis. EXAM: ULTRASOUND GUIDED THERAPEUTIC PARACENTESIS MEDICATIONS: 8 ml 1% lidocaine COMPLICATIONS: None immediate. PROCEDURE: Informed written consent was obtained from the patient after a discussion of the risks, benefits and  alternatives to treatment. A timeout was performed prior to the initiation of the procedure. Initial ultrasound scanning demonstrates a moderate amount of ascites within the right lower abdominal quadrant. The right lower abdomen was prepped and draped in the usual sterile fashion. 1% lidocaine was used for local anesthesia. Following this, a 19 gauge, 7-cm, Yueh catheter was introduced. An ultrasound image was saved for documentation purposes. The paracentesis was performed. The catheter was removed and a dressing was applied. The patient tolerated the procedure well without immediate post procedural complication. FINDINGS: A total of approximately 3.5 liters of yellow fluid was removed. IMPRESSION: Successful ultrasound-guided therapeutic paracentesis yielding 3.5 liters of peritoneal fluid. Read by: Rowe Robert, PA-C Electronically Signed   By: Ruthann Cancer M.D.   On: 11/02/2022 16:46   DG Abd 1 View  Result Date: 11/02/2022 CLINICAL DATA:  Abdominal distension. EXAM: ABDOMEN - 1 VIEW COMPARISON:  10/30/2022 FINDINGS: Persistent mildly distended air-filled small bowel loops with scattered air-fluid levels suggesting small bowel obstruction. No free air. The lung bases are grossly clear. IMPRESSION: Mild small-bowel obstruction bowel gas pattern. Electronically Signed   By: Marijo Sanes M.D.   On: 11/02/2022 12:39    Scheduled Meds:  (feeding supplement) PROSource Plus  30 mL Oral BID BM   amLODipine  10 mg Oral Daily   Chlorhexidine Gluconate Cloth  6 each Topical Daily   clonazepam  0.25 mg Oral BID   dexamethasone (DECADRON) injection  4 mg Intravenous Daily   enoxaparin (LOVENOX) injection  1 mg/kg Subcutaneous Q12H   feeding supplement  1 Container Oral TID BM   ferrous sulfate  325 mg Oral Q breakfast   furosemide  40 mg Oral Daily   HYDROmorphone   Intravenous Q4H   insulin aspart  0-15 Units Subcutaneous Q4H   losartan  50 mg Oral Daily   metoCLOPramide (REGLAN) injection  5 mg  Intravenous Q6H   pantoprazole (PROTONIX) IV  40 mg Intravenous Q24H   polyethylene glycol  17 g Oral BID   pregabalin  25 mg Oral QHS   senna-docusate  2 tablet Oral QHS   sodium chloride flush  10-40 mL Intracatheter Q12H   spironolactone  25 mg Oral Daily   Continuous Infusions:  TPN ADULT (ION) 65 mL/hr at 11/03/22 0244   TPN ADULT (ION)       LOS: 14 days   Time spent: 35 minutes   Felicie Kocher Loann Quill, MD [facility]  If 7PM-7AM, please contact night-coverage [email or web address] 11/03/2022, 2:44 PM

## 2022-11-03 NOTE — Progress Notes (Addendum)
[facility] Gastroenterology Progress Note  Christina [name] 47 y.o. [birth year]  CC: Metastatic adenocarcinoma   Subjective: Patient seen and examined at bedside.  Abdominal distention improved after paracentesis.  Bowel movement since Thursday.  ROS : Afebrile, negative for vomiting.   Objective: Vital signs in last 24 hours: Vitals:   11/03/22 1137 11/03/22 1346  BP:  136/82  Pulse:  (!) 102  Resp: 16 20  Temp:  98.4 F (36.9 C)  SpO2: 97% 97%    Physical Exam:  General:  Alert, cooperative, no distress, appears stated age  Head:  Normocephalic, without obvious abnormality, atraumatic  Eyes:  , EOM's intact,   Lungs:   No visible respiratory distress  Heart:  Regular rate and rhythm, S1, S2 normal  Abdomen:   Abdomen is distended, bowel sounds are present but hypoactive, no peritoneal signs  Extremities: Extremities normal, atraumatic, no  edema  Pulses: 2+ and symmetric    Lab Results: Recent Labs    11/01/22 0512 11/03/22 0345  NA 130* 130*  K 4.4 3.9  CL 97* 99  CO2 22 23  GLUCOSE 215* 232*  BUN 20 19  CREATININE 0.50 0.35*  CALCIUM 8.3* 7.8*  MG 1.9  --   PHOS 3.3  --    Recent Labs    11/01/22 0512  AST 19  ALT 17  ALKPHOS 80  BILITOT 0.1*  PROT 5.8*  ALBUMIN 1.8*   Recent Labs    11/02/22 0521 11/03/22 0345  WBC 7.9 6.6  HGB 7.2* 8.6*  HCT 24.1* 27.4*  MCV 74.8* 76.8*  PLT 380 265   No results for input(s): "LABPROT", "INR" in the last 72 hours.    Assessment/Plan: -Metastatic adenocarcinoma with peritoneal carcinomatosis.  CT scan showed large cystic lesion in the pancreatic tail as well as bilateral ovarian lesion.  -Malignant ascites.  Immunohistochemical stains suggestive of upper GI or pancreatic biliary primary.  EGD on February 9 showed gastric compression in the antrum, mild esophagitis and gastritis.  Biopsies were negative for malignancy.  EUS for yesterday was canceled because of worsening abdominal distention. -Possible early  small bowel obstruction/ileus -no bowel movement since Thursday.  Recommendations ------------------------ -Increase MiraLAX to 3 times a day. -May have to consider outpatient EUS.  Patient is already started on chemotherapy. -Continue other supportive care.  Otis Brace MD, [place] 11/03/2022, 2:45 PM  Contact #  [phone]

## 2022-11-03 NOTE — Progress Notes (Signed)
PHARMACY - TOTAL PARENTERAL NUTRITION CONSULT NOTE   Indication:  gastric outlet obstruction with extrinsic compression   Patient Measurements: Height: 4' 11"$  (149.9 cm) Weight: 66.1 kg (145 lb 11.6 oz) IBW/kg (Calculated) : 43.2 TPN AdjBW (KG): 49.8 Body mass index is 29.43 kg/m. 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 Malignant ascites s/p paracentesis x 3. EGD: gastric outlet obstruction w/ extrinsic compression. Pharmacy consulted to manage TPN for nutrition support.   Glucose / Insulin: no hx DM. CBGs: 139-193 (goal < 150).  - SSI / 24 hrs:  16 units - Insulin in TPN:   - Dexamethasone 54m IV x1 on 2/14 - Dexamethasone 459mIV daily (start 2/16)  Electrolytes:  Na remains low despite max Na concentration, Other WNL including K, Cl, CorrCa 9.5 Renal: SCr low, BUN WNL Hepatic: AST, ALT, Alk Phos, Tbili WNL Triglycerides: WNL  2/15 Intake / Output; MIVF: I/O incomplete, unmeasured. - No IVF.  Lasix 4011mO daily.   - LBM + 2/14 GI Imaging:  2/11 CT abd/pelvis: Multiple dilated loops of small bowel have developed which may represent ileus versus partial or developing small bowel obstruction. Numerous thickened, hyperenhancing loops of distal small bowel, suggestive of infectious or inflammatory enteritis. Abnormal wall thickening of the gastric antrum, which could be secondary to gastritis or malignancy. Large bilateral multi-cystic adnexal masses concerning for primary or metastatic ovarian malignancy. Multiloculated cystic lesion of the pancreatic tail. 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 2/12 abdominal X-ray: Persistent moderate gas distension of large and small bowel suggestive of ileus vs. partial obstruction GI Surgeries / Procedures:  2/9 EGD: gastric outlet obstruction with extrinsic compression. 2/16 IR: paracentesis 3.5L  out  Central access: single lumen PAC placed on 2/9 for chemo, PICC placed on 2/12 for TPN TPN start date: 2/12  Nutritional Goals: Goal TPN rate is 65 mL/hr (provides 86 g of protein and 1607 kcals per day)  RD Assessment: (2/16) Estimated Needs Total Energy Estimated Needs: 1500-1700 Total Protein Estimated Needs: 75-90g Total Fluid Estimated Needs: 1.5L/day  Current Nutrition:  TPN Diet:  NPO (2/17)  Significant Events: 2/11[facility] > will start 2/12, Dr AdhTawanna SoloRN informed.  Plan:  Continue TPN at goal rate of 65 mL/hr Electrolytes in TPN:  Na 154m26m K 15mE43mCa 5mEq/31mg 2mEq/L69mos 10mmol/62mx chloride Insulin 18 units in bag Add standard MVI and trace elements to TPN Continue Moderate q4h SSI and adjust as needed  Change Folic acid to TPN d/t NPO Monitor TPN labs on Mon/Thurs and prn   ChristinGretta Arab BCPS [facility] main pharmacy [phone] 10:06 AM

## 2022-11-03 NOTE — Progress Notes (Incomplete)
   Palliative Medicine Inpatient Follow Up Note     Chart Reviewed. Patient assessed at the bedside. Patient sitting up in bed. Family present. No acute distress. Symptom check-in for Dr. Hilma Favors.  Reports she is feeling much better. Pain is 1 on 10 scale. She is much appreciative of this. Reports pain is worst with movement however manageable. Denies shortness of breath, nausea. Focusing on having bowel movement.   Discussed the importance of continued conversation with family and their  medical providers regarding overall plan of care and treatment options, ensuring decisions are within the context of the patients values and GOCs.   Questions addressed and support provided.    Objective Assessment: Vital Signs Vitals:   11/03/22 1137 11/03/22 1346  BP:  136/82  Pulse:  (!) 102  Resp: 16 20  Temp:  98.4 F (36.9 C)  SpO2: 97% 97%    Intake/Output Summary (Last 24 hours) at 11/03/2022 1629 Last data filed at 11/03/2022 0654 Gross per 24 hour  Intake 1867.32 ml  Output 2200 ml  Net -332.68 ml   Last Weight  Most recent update: 11/03/2022  6:52 AM    Weight  66.1 kg (145 lb 11.6 oz)            Gen:  NAD CV: Regular rate and rhythm, no murmurs rubs or gallops PULM: clear to auscultation bilaterally. No wheezes/rales/rhonchi ABD: soft/nontender/distended/hypoactive bowel sounds EXT: No edema Neuro: Alert and oriented x3  SUMMARY OF RECOMMENDATIONS   Continue with current plan of care per medical team  PMT will continue to support and follow on as needed basis. Please secure chat for urgent needs.   SYMPTOM MANAGEMENT  Reglan 64m TID for nausea and motility PPI for possible malignant obstruction and GI protection Decadron 470mdaily for inflammation and nausea Dulcolax suppository/or Fleets until taking PO fully May consider octreotide for malignant dysmotility/obstruction if no improvement in next 24-48 hours. Allow for sips and ice chips for comfort. No NG tube  unless she has vomiting. Maintain Dilaudid PCA until some of her acute issues stabilize usage has been stable for past 24 hours (21.11m30m  Time Total: 20 MIN   Visit consisted of counseling and education dealing with the complex and emotionally intense issues of symptom management and palliative care in the setting of serious and potentially life-threatening illness.Greater than 50%  of this time was spent counseling and coordinating care related to the above assessment and plan.  NikAlda LeaGPCNP-BC  [phone] Medicine Team providers are available by phone from 7am to 7pm daily and can be reached through the team cell phone. Should this patient require assistance outside of these hours, please call the patient's attending physician.

## 2022-11-04 DIAGNOSIS — C786 Secondary malignant neoplasm of retroperitoneum and peritoneum: Secondary | ICD-10-CM | POA: Diagnosis not present

## 2022-11-04 LAB — GLUCOSE, CAPILLARY
Glucose-Capillary: 119 mg/dL — ABNORMAL HIGH (ref 70–99)
Glucose-Capillary: 146 mg/dL — ABNORMAL HIGH (ref 70–99)
Glucose-Capillary: 161 mg/dL — ABNORMAL HIGH (ref 70–99)
Glucose-Capillary: 167 mg/dL — ABNORMAL HIGH (ref 70–99)
Glucose-Capillary: 181 mg/dL — ABNORMAL HIGH (ref 70–99)
Glucose-Capillary: 268 mg/dL — ABNORMAL HIGH (ref 70–99)

## 2022-11-04 LAB — COMPREHENSIVE METABOLIC PANEL
ALT: 66 U/L — ABNORMAL HIGH (ref 0–44)
AST: 31 U/L (ref 15–41)
Albumin: 1.7 g/dL — ABNORMAL LOW (ref 3.5–5.0)
Alkaline Phosphatase: 113 U/L (ref 38–126)
Anion gap: 4 — ABNORMAL LOW (ref 5–15)
BUN: 24 mg/dL — ABNORMAL HIGH (ref 6–20)
CO2: 22 mmol/L (ref 22–32)
Calcium: 7.6 mg/dL — ABNORMAL LOW (ref 8.9–10.3)
Chloride: 109 mmol/L (ref 98–111)
Creatinine, Ser: 0.54 mg/dL (ref 0.44–1.00)
GFR, Estimated: >60 mL/min (ref >60)
Glucose, Bld: 176 mg/dL — ABNORMAL HIGH (ref 70–99)
Potassium: 3.6 mmol/L (ref 3.5–5.1)
Sodium: 135 mmol/L (ref 135–145)
Total Bilirubin: 0.2 mg/dL — ABNORMAL LOW (ref 0.3–1.2)
Total Protein: 4.9 g/dL — ABNORMAL LOW (ref 6.5–8.1)

## 2022-11-04 LAB — PHOSPHORUS: Phosphorus: 3.4 mg/dL (ref 2.5–4.6)

## 2022-11-04 LAB — CBC
HCT: 29 % — ABNORMAL LOW (ref 36.0–46.0)
Hemoglobin: 8.7 g/dL — ABNORMAL LOW (ref 12.0–15.0)
MCH: 23.8 pg — ABNORMAL LOW (ref 26.0–34.0)
MCHC: 30 g/dL (ref 30.0–36.0)
MCV: 79.2 fL — ABNORMAL LOW (ref 80.0–100.0)
Platelets: 225 10*3/uL (ref 150–400)
RBC: 3.66 MIL/uL — ABNORMAL LOW (ref 3.87–5.11)
RDW: 18 % — ABNORMAL HIGH (ref 11.5–15.5)
WBC: 4.4 10*3/uL (ref 4.0–10.5)
nRBC: 0 % (ref 0.0–0.2)

## 2022-11-04 LAB — MAGNESIUM: Magnesium: 2.1 mg/dL (ref 1.7–2.4)

## 2022-11-04 LAB — HEPARIN LEVEL (UNFRACTIONATED): Heparin Unfractionated: 0.28 [IU]/mL — ABNORMAL LOW (ref 0.30–0.70)

## 2022-11-04 MED ORDER — FUROSEMIDE 10 MG/ML IJ SOLN
40.0000 mg | Freq: Every day | INTRAMUSCULAR | Status: DC
  Administered 2022-11-04 – 2022-11-15 (×12): 40 mg via INTRAVENOUS
  Filled 2022-11-04 (×12): qty 4

## 2022-11-04 MED ORDER — TRAVASOL 10 % IV SOLN
INTRAVENOUS | Status: AC
  Filled 2022-11-04: qty 858

## 2022-11-04 NOTE — Progress Notes (Signed)
PHARMACY - TOTAL PARENTERAL NUTRITION CONSULT NOTE   Indication:  gastric outlet obstruction with extrinsic compression   Patient Measurements: Height: 4' 11"$  (149.9 cm) Weight: 66.1 kg (145 lb 11.6 oz) IBW/kg (Calculated) : 43.2 TPN AdjBW (KG): 49.8 Body mass index is 29.43 kg/m. 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 Malignant ascites s/p paracentesis x 3. EGD: gastric outlet obstruction w/ extrinsic compression. Pharmacy consulted to manage TPN for nutrition support.   Glucose / Insulin: no hx DM. CBGs: 119-197 (goal < 150).  - SSI / 24 hrs:  14 units - Insulin in TPN:  18 units - Dexamethasone 23m IV x1 on 2/14 - Dexamethasone 41mIV daily (start 2/16)  Electrolytes:  Na improved to WNL (max Na concentration in TPN), Other WNL including K, Phos, Mag, CorrCa 9.44 Renal: SCr low Hepatic: ALT slightly elevated, Alk Phos WNL, Tbili low Triglycerides: WNL  2/15 Intake / Output; MIVF: I/O incomplete, unmeasured UOP x4 - No IVF.  Lasix 4027mO daily.   - LBM 2/17 GI Imaging:  2/11 CT abd/pelvis: Multiple dilated loops of small bowel have developed which may represent ileus versus partial or developing small bowel obstruction. Numerous thickened, hyperenhancing loops of distal small bowel, suggestive of infectious or inflammatory enteritis. Abnormal wall thickening of the gastric antrum, which could be secondary to gastritis or malignancy. Large bilateral multi-cystic adnexal masses concerning for primary or metastatic ovarian malignancy. Multiloculated cystic lesion of the pancreatic tail. 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 2/12 abdominal X-ray: Persistent moderate gas distension of large and small bowel suggestive of ileus vs. partial obstruction GI Surgeries / Procedures:  2/9 EGD: gastric outlet obstruction with extrinsic  compression. 2/16 IR: paracentesis 3.5L out  Central access: single lumen PAC placed on 2/9 for chemo, PICC placed on 2/12 for TPN TPN start date: 2/12 - 2/11: [facility].  Will start 2/12, Dr AdhTawanna SoloRN informed.   Nutritional Goals: Goal TPN rate is 65 mL/hr (provides 86 g of protein and 1607 kcals per day)  RD Assessment:  Estimated Needs Total Energy Estimated Needs: 1500-1700 Total Protein Estimated Needs: 75-90g Total Fluid Estimated Needs: 1.5L/day  Current Nutrition:  TPN Diet: CLD (2/17)  Plan:  Continue TPN at goal rate of 65 mL/hr Electrolytes in TPN:  Na 150 mEq/L K 20 mEq/L Ca 5mE64m Mg 2 mEq/L Phos 10 mmol/L Ac:Cl ratio  1:1 Insulin 20 units in bag Add standard MVI and trace elements to TPN Add folic acid to TPN Continue Moderate q4h SSI and adjust as needed  Monitor TPN labs on Mon/Thurs and prn   [name], BCPS [facility] main pharmacy [phone] 9:34 AM

## 2022-11-04 NOTE — Progress Notes (Signed)
PROGRESS NOTE    Christina [name]  [id number] DOB: 03/09/1976 DOA: 10/19/2022 PCP: [name], MD   Brief Narrative:  Patient is a 47 year old female with history of anxiety,HPV, who presented with complaint of abdominal distention, constipation.  CT abdomen/pelvis showed peritoneal carcinomatosis with extensive metastasis, ascites, multiloculated cystic lesion of the pancreatic tail.  GYN oncology consulted initially .  Underwent paracentesis x 3.  Tumor markers are elevated :CA125, CEA. . 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  Oncology, GI following.  S/p  EGD, plan for EUS/biopsy as per GI but has been postponed.Started  on TPN. Hospital course remarkable for persistent abdominal pain, distention, constipation, poor oral intake.  Palliative care also following for goals of care.  Oncology started chemotherapy   Assessment & Plan:   Abdominal pain secondary to metastatic adenocarcinoma with peritoneal carcinomatosis:  -Concern of extensive metastatic disease with unknown primary as seen on the CT scan.  CT concerning for bilateral cystic/solid ovarian masses, pancreatic lesion also noted.  CEA, CA125 elevated, CA 19-9 is normal. -Cytology report from paracentesis showed adenocarcinoma most likely from upper GI or pancreaticobiliary origin.   -Medical oncology on board and palliative care also consulted regarding poor prognosis.   -GI consulted, status post EGD on 2/9.   -EGD was negative for malignancy in the esophagus, stomach or duodenum but was found to have significant extrinsic compression  in the gastric antrum.  GI planning for endoscopic ultrasound and biopsy but wanted to wait because of abdominal distention.  GI to decide timings for EUS. -Oncology started  chemotherapy .  Underwent Port-A-Cath placement on 2/9. -Now on PCA pump.  -Palliative on board-recommend Reglan 5 mg 3 times daily, PPI, Decadron 4 mg  for inflammation and nausea.  No NG unless she has vomiting.   Malignant ascites:  -Underwent paracentesis on 2/3 , 2/6,  2/10, 2/16.   Continue supportive care.   -cont. spironolactone, Lasix.    Constipation/abdominal distention:Abdominal x-ray done multiple times showing ileus, possible partial obstruction.  -NG tube placed on 2/12 however later close removed due to patient's discomfort -Abdomen x-ray from 1/16 shows mild small bowel obstruction bowel gas pattern.  -GI increase MiraLAX to 3 times daily.  Now on clear liquid diet -Had a large bowel movement last night.    PE:  -Asymptomatic.  CT chest done with contrast for staging purpose showed small embolus on the proximal right lower lobe pulmonary artery.  Currently on full dose Lovenox.  Will change Eliquis when appropriate.  She is on room air and intermittent [place]   Microcytic anemia/iron deficiency/folic acid deficiency:  -Continue iron supplement, folic acid supplement.  Monitor  hemoglobin.  Currently in the range of 7-8.  S/p 1 unit PRBC transfusion on 2/16.  H&H is stable this a.m.   -Denies any hematochezia or melena.   Moderate protein calorie malnutrition:  Hypoalbuminemia:  -Nutritionist consulted and following.   Started  TPN, s/p PICC line   Hypokalemia/hypomagnesemia: Replenished   Hypertension: On amlodipine 10 mg , losartan  -Continue to monitor  Hyponatremia: Resolved  DVT prophylaxis: Full dose Lovenox  code Status: Full code Family Communication:  None present at bedside.  Plan of care discussed with patient in length and she verbalized understanding and agreed with it. Disposition Plan: To be determined  Consultants:  GI Oncology Palliative care  Procedures:  Status) EGD   Status is: Inpatient    Subjective: Patient seen and examined.  Sitting comfortably in the bed.  Reports that she feels better this morning had a large bowel movement last night.  Passing gas.  No nausea, vomiting, blood in  the stool.  Looking forward to try to clear liquid this morning.  No acute events overnight.  Objective: Vitals:   11/04/22 0020 11/04/22 0402 11/04/22 0434 11/04/22 0757  BP:  136/86    Pulse:  97    Resp: 18 19 18 16  $ Temp:  98 F (36.7 C)    TempSrc:  Oral    SpO2: 100% 99% 99% 97%  Weight:      Height:        Intake/Output Summary (Last 24 hours) at 11/04/2022 0949 Last data filed at 11/04/2022 0700 Gross per 24 hour  Intake 1215.98 ml  Output --  Net 1215.98 ml    Filed Weights   10/28/22 0550 10/31/22 0524 11/03/22 0500  Weight: 68.1 kg 67.5 kg 66.1 kg    Examination:  General exam: Appears calm and comfortable, on nasal cannula, communicating well Respiratory system: Clear to auscultation. Respiratory effort normal. Cardiovascular system: S1 & S2 heard, RRR. No JVD, murmurs, rubs, gallops or clicks. No pedal edema. Gastrointestinal system: Abdomen is distended, soft and nontender. No organomegaly or masses felt.  Bowel sounds positive.   Central nervous system: Alert and oriented. No focal neurological deficits. Extremities: Symmetric 5 x 5 power. Skin: No rashes, lesions or ulcers Psychiatry: Judgement and insight appear normal. Mood & affect appropriate.    Data Reviewed: I have personally reviewed following labs and imaging studies  CBC: Recent Labs  Lab 10/31/22 0520 11/01/22 0512 11/02/22 0521 11/03/22 0345 11/04/22 0638  WBC 7.5 9.0 7.9 6.6 4.4  HGB 7.5* 7.1* 7.2* 8.6* 8.7*  HCT 24.5* 23.7* 24.1* 27.4* 29.0*  MCV 74.2* 75.5* 74.8* 76.8* 79.2*  PLT 426* 427* 380 265 [id number]    Basic Metabolic Panel: Recent Labs  Lab 10/29/22 0415 10/30/22 0509 10/31/22 0520 11/01/22 0512 11/03/22 0345 11/04/22 0638  NA 127* 127* 127* 130* 130* 135  K 4.5 4.1 4.4 4.4 3.9 3.6  CL 90* 90* 96* 97* 99 109  CO2 26 26 23 22 23 22  $ GLUCOSE 138* 174* 188* 215* 232* 176*  BUN 25* 25* 26* 20 19 24*  CREATININE 0.73 0.64 0.56 0.50 0.35* 0.54  CALCIUM 8.0* 7.8* 7.9*  8.3* 7.8* 7.6*  MG 2.4 2.4 2.3 1.9  --  2.1  PHOS 4.6 3.5 4.0 3.3  --  3.4    GFR: Estimated Creatinine Clearance: 71.1 mL/min (by C-G formula based on SCr of 0.54 mg/dL). Liver Function Tests: Recent Labs  Lab 10/29/22 0415 10/30/22 0509 10/31/22 0520 11/01/22 0512 11/04/22 0638  AST 11* 8* 13* 19 31  ALT 14 14 15 17 $ 66*  ALKPHOS 88 88 89 80 113  BILITOT 0.4 0.4 0.3 0.1* 0.2*  PROT 5.7* 5.6* 5.4* 5.8* 4.9*  ALBUMIN 2.1* 2.1* 1.9* 1.8* 1.7*    No results for input(s): "LIPASE", "AMYLASE" in the last 168 hours. No results for input(s): "AMMONIA" in the last 168 hours. Coagulation Profile: No results for input(s): "INR", "PROTIME" in the last 168 hours. Cardiac Enzymes: No results for input(s): "CKTOTAL", "CKMB", "CKMBINDEX", "TROPONINI" in the last 168 hours. BNP (last 3 results) No results for input(s): "PROBNP" in the last 8760 hours. HbA1C: No results for input(s): "HGBA1C" in the last 72 hours. CBG: Recent Labs  Lab 11/03/22 1639 11/03/22 2139 11/04/22 0009 11/04/22 0401 11/04/22 0743  GLUCAP 197*  144* 167* 119* 146*    Lipid Profile: No results for input(s): "CHOL", "HDL", "LDLCALC", "TRIG", "CHOLHDL", "LDLDIRECT" in the last 72 hours.  Thyroid Function Tests: No results for input(s): "TSH", "T4TOTAL", "FREET4", "T3FREE", "THYROIDAB" in the last 72 hours. Anemia Panel: No results for input(s): "VITAMINB12", "FOLATE", "FERRITIN", "TIBC", "IRON", "RETICCTPCT" in the last 72 hours. Sepsis Labs: No results for input(s): "PROCALCITON", "LATICACIDVEN" in the last 168 hours.  No results found for this or any previous visit (from the past 240 hour(s)).    Radiology Studies: US Paracentesis  Result Date: 11/02/2022 INDICATION: Patient with history of peritoneal carcinomatosis, adenocarcinoma from likely upper GI /pancreaticobiliary origin, recurrent ascites, pulmonary embolus, anemia; request received for therapeutic paracentesis. EXAM: ULTRASOUND GUIDED  THERAPEUTIC PARACENTESIS MEDICATIONS: 8 ml 1% lidocaine COMPLICATIONS: None immediate. PROCEDURE: Informed written consent was obtained from the patient after a discussion of the risks, benefits and alternatives to treatment. A timeout was performed prior to the initiation of the procedure. Initial ultrasound scanning demonstrates a moderate amount of ascites within the right lower abdominal quadrant. The right lower abdomen was prepped and draped in the usual sterile fashion. 1% lidocaine was used for local anesthesia. Following this, a 19 gauge, 7-cm, Yueh catheter was introduced. An ultrasound image was saved for documentation purposes. The paracentesis was performed. The catheter was removed and a dressing was applied. The patient tolerated the procedure well without immediate post procedural complication. FINDINGS: A total of approximately 3.5 liters of yellow fluid was removed. IMPRESSION: Successful ultrasound-guided therapeutic paracentesis yielding 3.5 liters of peritoneal fluid. Read by: Rowe Robert, PA-C Electronically Signed   By: Ruthann Cancer M.D.   On: 11/02/2022 16:46   DG Abd 1 View  Result Date: 11/02/2022 CLINICAL DATA:  Abdominal distension. EXAM: ABDOMEN - 1 VIEW COMPARISON:  10/30/2022 FINDINGS: Persistent mildly distended air-filled small bowel loops with scattered air-fluid levels suggesting small bowel obstruction. No free air. The lung bases are grossly clear. IMPRESSION: Mild small-bowel obstruction bowel gas pattern. Electronically Signed   By: Marijo Sanes M.D.   On: 11/02/2022 12:39    Scheduled Meds:  (feeding supplement) PROSource Plus  30 mL Oral BID BM   amLODipine  10 mg Oral Daily   Chlorhexidine Gluconate Cloth  6 each Topical Daily   clonazepam  0.25 mg Oral BID   dexamethasone (DECADRON) injection  4 mg Intravenous Daily   enoxaparin (LOVENOX) injection  1 mg/kg Subcutaneous Q12H   feeding supplement  1 Container Oral TID BM   ferrous sulfate  325 mg Oral Q  breakfast   furosemide  40 mg Intravenous Daily   HYDROmorphone   Intravenous Q4H   insulin aspart  0-15 Units Subcutaneous Q4H   losartan  50 mg Oral Daily   metoCLOPramide (REGLAN) injection  5 mg Intravenous Q6H   pantoprazole (PROTONIX) IV  40 mg Intravenous Q24H   polyethylene glycol  17 g Oral Q8H   pregabalin  25 mg Oral QHS   senna-docusate  2 tablet Oral QHS   sodium chloride flush  10-40 mL Intracatheter Q12H   spironolactone  25 mg Oral Daily   Continuous Infusions:  TPN ADULT (ION) 65 mL/hr at 11/03/22 1842   TPN ADULT (ION)       LOS: 15 days   Time spent: 35 minutes   Deltha Bernales Loann Quill, MD [facility]  If 7PM-7AM, please contact night-coverage [email or web address] 11/04/2022, 9:49 AM

## 2022-11-04 NOTE — Progress Notes (Signed)
[facility] Gastroenterology Progress Note  Christina [name] 47 y.o. 1976/09/14  CC: Metastatic adenocarcinoma   Subjective: Patient seen and examined at bedside.  Had large bowel movement recently with mild improvement in abdominal distention.  ROS : Afebrile, negative for vomiting.   Objective: Vital signs in last 24 hours: Vitals:   11/04/22 0434 11/04/22 0757  BP:    Pulse:    Resp: 18 16  Temp:    SpO2: 99% 97%    Physical Exam:  General:  Alert, cooperative, no distress, appears stated age  Head:  Normocephalic, without obvious abnormality, atraumatic  Eyes:  , EOM's intact,   Lungs:   No visible respiratory distress  Heart:  Regular rate and rhythm, S1, S2 normal  Abdomen:   Abdomen is distended, bowel sounds are present but hypoactive, no peritoneal signs  Extremities: Extremities normal, atraumatic, no  edema  Pulses: 2+ and symmetric    Lab Results: Recent Labs    11/03/22 0345 11/04/22 0638  NA 130* 135  K 3.9 3.6  CL 99 109  CO2 23 22  GLUCOSE 232* 176*  BUN 19 24*  CREATININE 0.35* 0.54  CALCIUM 7.8* 7.6*  MG  --  2.1  PHOS  --  3.4   Recent Labs    11/04/22 0638  AST 31  ALT 66*  ALKPHOS 113  BILITOT 0.2*  PROT 4.9*  ALBUMIN 1.7*   Recent Labs    11/03/22 0345 11/04/22 0638  WBC 6.6 4.4  HGB 8.6* 8.7*  HCT 27.4* 29.0*  MCV 76.8* 79.2*  PLT 265 225   No results for input(s): "LABPROT", "INR" in the last 72 hours.    Assessment/Plan: -Metastatic adenocarcinoma with peritoneal carcinomatosis.  CT scan showed large cystic lesion in the pancreatic tail as well as bilateral ovarian lesion.  -Malignant ascites.  Immunohistochemical stains suggestive of upper GI or pancreatic biliary primary.  EGD on February 9 showed gastric compression in the antrum, mild esophagitis and gastritis.  Biopsies were negative for malignancy.  EUS for yesterday was canceled because of worsening abdominal distention. -Possible early small bowel obstruction/ileus  -no bowel movement since Thursday.  Recommendations ------------------------ -Patient had large bowel movement recently but abdomen still remains moderately distended.  Continue MiraLAX for now. -May have to consider outpatient EUS.  Patient is already started on chemotherapy.  I will discuss with Dr. Paulita Fujita regarding timing for EUS. -Continue other supportive care.  Otis Brace MD, [place] 11/04/2022, 9:16 AM  Contact #  [phone]

## 2022-11-05 DIAGNOSIS — G893 Neoplasm related pain (acute) (chronic): Secondary | ICD-10-CM | POA: Diagnosis not present

## 2022-11-05 DIAGNOSIS — Z7189 Other specified counseling: Secondary | ICD-10-CM

## 2022-11-05 DIAGNOSIS — Z515 Encounter for palliative care: Secondary | ICD-10-CM | POA: Diagnosis not present

## 2022-11-05 DIAGNOSIS — K5900 Constipation, unspecified: Secondary | ICD-10-CM | POA: Diagnosis not present

## 2022-11-05 DIAGNOSIS — R180 Malignant ascites: Secondary | ICD-10-CM | POA: Diagnosis not present

## 2022-11-05 DIAGNOSIS — C786 Secondary malignant neoplasm of retroperitoneum and peritoneum: Secondary | ICD-10-CM | POA: Diagnosis not present

## 2022-11-05 LAB — COMPREHENSIVE METABOLIC PANEL
ALT: 116 U/L — ABNORMAL HIGH (ref 0–44)
AST: 48 U/L — ABNORMAL HIGH (ref 15–41)
Albumin: 1.6 g/dL — ABNORMAL LOW (ref 3.5–5.0)
Alkaline Phosphatase: 117 U/L (ref 38–126)
Anion gap: 9 (ref 5–15)
BUN: 19 mg/dL (ref 6–20)
CO2: 24 mmol/L (ref 22–32)
Calcium: 8 mg/dL — ABNORMAL LOW (ref 8.9–10.3)
Chloride: 101 mmol/L (ref 98–111)
Creatinine, Ser: 0.43 mg/dL — ABNORMAL LOW (ref 0.44–1.00)
GFR, Estimated: >60 mL/min (ref >60)
Glucose, Bld: 191 mg/dL — ABNORMAL HIGH (ref 70–99)
Potassium: 3.8 mmol/L (ref 3.5–5.1)
Sodium: 134 mmol/L — ABNORMAL LOW (ref 135–145)
Total Bilirubin: 0.3 mg/dL (ref 0.3–1.2)
Total Protein: 4.7 g/dL — ABNORMAL LOW (ref 6.5–8.1)

## 2022-11-05 LAB — GLUCOSE, CAPILLARY
Glucose-Capillary: 127 mg/dL — ABNORMAL HIGH (ref 70–99)
Glucose-Capillary: 140 mg/dL — ABNORMAL HIGH (ref 70–99)
Glucose-Capillary: 162 mg/dL — ABNORMAL HIGH (ref 70–99)
Glucose-Capillary: 165 mg/dL — ABNORMAL HIGH (ref 70–99)
Glucose-Capillary: 181 mg/dL — ABNORMAL HIGH (ref 70–99)
Glucose-Capillary: 98 mg/dL (ref 70–99)

## 2022-11-05 LAB — CBC
HCT: 26.2 % — ABNORMAL LOW (ref 36.0–46.0)
Hemoglobin: 8.1 g/dL — ABNORMAL LOW (ref 12.0–15.0)
MCH: 23.7 pg — ABNORMAL LOW (ref 26.0–34.0)
MCHC: 30.9 g/dL (ref 30.0–36.0)
MCV: 76.6 fL — ABNORMAL LOW (ref 80.0–100.0)
Platelets: 171 10*3/uL (ref 150–400)
RBC: 3.42 MIL/uL — ABNORMAL LOW (ref 3.87–5.11)
RDW: 18.2 % — ABNORMAL HIGH (ref 11.5–15.5)
WBC: 3.5 10*3/uL — ABNORMAL LOW (ref 4.0–10.5)
nRBC: 0 % (ref 0.0–0.2)

## 2022-11-05 LAB — TRIGLYCERIDES: Triglycerides: 99 mg/dL (ref <150)

## 2022-11-05 LAB — HEPARIN LEVEL (UNFRACTIONATED): Heparin Unfractionated: 0.27 IU/mL — ABNORMAL LOW (ref 0.30–0.70)

## 2022-11-05 LAB — MAGNESIUM: Magnesium: 1.9 mg/dL (ref 1.7–2.4)

## 2022-11-05 LAB — PHOSPHORUS: Phosphorus: 2.9 mg/dL (ref 2.5–4.6)

## 2022-11-05 MED ORDER — METOCLOPRAMIDE HCL 5 MG/ML IJ SOLN
5.0000 mg | Freq: Three times a day (TID) | INTRAMUSCULAR | Status: DC
  Administered 2022-11-05 – 2022-11-07 (×6): 5 mg via INTRAVENOUS
  Filled 2022-11-05 (×6): qty 2

## 2022-11-05 MED ORDER — FENTANYL 50 MCG/HR TD PT72
1.0000 | MEDICATED_PATCH | TRANSDERMAL | Status: DC
  Administered 2022-11-05 – 2022-11-11 (×3): 1 via TRANSDERMAL
  Filled 2022-11-05 (×3): qty 1

## 2022-11-05 MED ORDER — FENTANYL 50 MCG/HR TD PT72: 1.0000 | MEDICATED_PATCH | TRANSDERMAL | Status: DC

## 2022-11-05 MED ORDER — TRAVASOL 10 % IV SOLN
INTRAVENOUS | Status: AC
  Filled 2022-11-05: qty 858

## 2022-11-05 MED ORDER — HYDROMORPHONE 1 MG/ML IV SOLN
INTRAVENOUS | Status: DC
  Administered 2022-11-05: 2.16 mg via INTRAVENOUS
  Administered 2022-11-06: 0.5 mg via INTRAVENOUS
  Administered 2022-11-06: 1 mg via INTRAVENOUS
  Administered 2022-11-06 – 2022-11-07 (×2): 0.5 mg via INTRAVENOUS
  Administered 2022-11-07: 1 mg via INTRAVENOUS
  Administered 2022-11-07 (×2): 0.5 mg via INTRAVENOUS

## 2022-11-05 NOTE — Progress Notes (Signed)
Daily Progress Note   Patient Name: Christina [name]       Date: 11/05/2022 DOB: June 13, 1976  Age: 47 y.o. MRN#: [id number] Attending Physician: Mckinley Jewel, MD Primary Care Physician: [name], MD Admit Date: 10/19/2022 Length of Stay: 16 days  Reason for Consultation/Follow-up: Establishing goals of care and Symptom Management  Subjective:   CC: Patient noting feeling tired today due to multiple awakenings overnight. Following up regarding symptom management.   Subjective:  At time of EMR review, patient had received 16.42m IV dilaudid in the past 24 hours between basal and bolus dosing. Patient continues to receive Lyrica 25 mg daily.Patient also receiving Klonopin 0.271mBID, dexamethasone 48m61maily, and Reglan q6hr scheduled. Multiple Bms noted yesterday.   When seeing patient this morning, she is laying comfortably in bed. Notes pain still controlled with current PCA regimen. We discussed hoping to transition to regimen that can be continued in the outpatient setting. Discussed starting fentanyl patch today and will discontinue basal IV dilaudid 12 hours after fentanyl patch placement to allow time for absorption. Will continue on IV dilaudid bolus dosing via PCA for breakthrough management of pain. Will need to eventually get on short acting oral opioid for pain management though want to focus on long acting regimen at this time. Patient agreeing with plan.  Patient noted tolerating liquid diet at this time.   Review of Systems Feeling tired today Objective:   Vital Signs:  BP 122/79 (BP Location: Left Arm)   Pulse 88   Temp 98.3 F (36.8 C) (Oral)   Resp 18   Ht 4' 11"$  (1.499 m)   Wt 67.4 kg   LMP 10/12/2022   SpO2 99%   BMI 30.01 kg/m   Physical Exam: General: NAD, alert, laying in bed Eyes: no drainage noted HENT: dry mucous membranes Cardiovascular: RRR Respiratory: no increased work of breathing noted, not in respiratory distress Abdomen:  distended Extremities: moving Ues appropriately  Skin: no rashes or lesions on visible skin Neuro: A&Ox4, following commands easily Psych: appropriately answers all questions  Imaging:  I personally reviewed recent imaging.   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  Palliative medicine team consulted to assist with symptom management.   Recommendations/Plan: # Complex medical decision making/goals of care:  -Continuing with full scope of care at this time.Patient will [facility] hopefully continue supporting goals for medical care moving forward.                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 Attempt to transition to more sustainable outpatient pain regimen. At time of EMR review, in past 24 hours patient has received 16.5 mg of IV Dilaudid; ~205 OMEs.  -Start Fentanyl patch 50 mcg at noon today                -  Discussed plan with pharmacy to place orders. Continue current basal/bolus PCA until midnight tonight (12 hours after start of fentanyl patch). THEN DISCONTINUE basal IV dilaudid. CONTINUE IV dilaudid 0.42m q15 mins bolus dosing for breakthrough management.    -Will eventually need transition to oral for breakthrough medication though at this time working with long acting opioid to better  assess requirements.   -Continue dexamethasone 447mdaily at this time. Consider transition to po in next 1-2 days then taper if appropraite.   -NO NG tube unless has vomiting    Anxiety, in setting of recent cancer diagnosis  -Continue Klonopin 0.2517mID                 -Constipation, acute on chronic in setting of malignant ascites  -Receiving Reglan IV 5mg72mhrs scheduled. Consider transition to po in next 1-2 days then taper if appropraite.   -On Miralax TID as per GI.  # Discharge Planning: TBD  -[facility] Please reach out to provider AtheJobe Gibbontime of discharge to inform of this as will need close follow up.   Discussed with: patient, hospitalist, bedside RN, pharmacist- Abby  Thank you for allowing the palliative care team to participate in the care DebrDonna ChristenaurChelsea Aus Palliative Care Provider PMT # [phone] patient remains symptomatic despite maximum doses, please call PMT at [phone] 0700 and 1900. Outside of these hours, please call attending, as PMT does not have night coverage.

## 2022-11-05 NOTE — Progress Notes (Signed)
PROGRESS NOTE    Christina [name]  [id number] DOB: [birth year] DOA: 10/19/2022 PCP: Christina Sacramento, MD   Brief Narrative:  Patient is a 47 year old female with history of anxiety,HPV, who presented with complaint of abdominal distention, constipation.  CT abdomen/pelvis showed peritoneal carcinomatosis with extensive metastasis, ascites, multiloculated cystic lesion of the pancreatic tail.  GYN oncology consulted initially .  Underwent paracentesis x 3.  Tumor markers are elevated :CA125, CEA. . 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  Oncology, GI following.  S/p  EGD, plan for EUS/biopsy as per GI but has been postponed.Started  on TPN. Hospital course remarkable for persistent abdominal pain, distention, constipation, poor oral intake.  Palliative care also following for goals of care.  Oncology started chemotherapy   Assessment & Plan:   Metastatic adenocarcinoma with peritoneal carcinomatosis:  -With large cystic lesion in pancreatic tail and bilateral ovaries -Concern of extensive metastatic disease with unknown primary as seen on the CT scan.  CT concerning for bilateral cystic/solid ovarian masses, pancreatic lesion also noted.  CEA, CA125 elevated, CA 19-9 is normal. -Cytology report from paracentesis showed adenocarcinoma most likely from upper GI or pancreaticobiliary origin.   -Medical oncology on board and palliative care also consulted regarding poor prognosis.   -GI consulted, status post EGD on 2/9.   -EGD was negative for malignancy in the esophagus, stomach or duodenum but was found to have significant extrinsic compression  in the gastric antrum.  -EUS was initially planned however it was canceled later due to abdominal distention.  -Now GI does not think EUS will be high yield procedure and it's finding may not change patient's management.  EUS can be done later as an outpatient once her symptoms  improved. - Underwent Port-A-Cath placement on 2/9. Oncology started chemotherapy.   Pain management Goals of care: -Palliative on board-recommend Reglan 5 mg 3 times daily, PPI, Decadron 4 mg for inflammation and nausea.  No NG unless she has vomiting. -Was on PCA pump-switched to fentanyl patch at noon today per palliative.   Malignant ascites:  -Underwent paracentesis on 2/3 , 2/6,  2/10, 2/16.   Continue supportive care.   -cont. spironolactone, Lasix.    Constipation/abdominal distention:Abdominal x-ray done multiple times showing ileus, possible partial obstruction.  -NG tube placed on 2/12 however later close removed due to patient's discomfort -Abdomen x-ray from 1/16 shows mild small bowel obstruction bowel gas pattern.  -GI increase MiraLAX to 3 times daily.  Switched clear liquid to full liquid diet today -Had a large bowel movement last night.   -Receiving Reglan IV 5 mg every 8 hours scheduled.  PE:  -Asymptomatic.  CT chest done with contrast for staging purpose showed small embolus on the proximal right lower lobe pulmonary artery.  Currently on full dose Lovenox.  Will change Eliquis when appropriate.  She is on room air and intermittent [place]   Microcytic anemia/iron deficiency/folic acid deficiency:  -Continue iron supplement, folic acid supplement.  Monitor  hemoglobin.  Currently in the range of 7-8.  S/p 1 unit PRBC transfusion on 2/16.  H&H is stable this a.m.   -Denies any hematochezia or melena.   Moderate protein calorie malnutrition:  Hypoalbuminemia:  -Nutritionist consulted and following.   on  TPN, s/p PICC line   Hypokalemia/hypomagnesemia: Replenished   Hypertension: On amlodipine 10 mg , losartan  -Continue to monitor  Hyponatremia: Mild continue to monitor  Elevated liver enzymes: Likely in the setting of  TPN -Continue to monitor  DVT prophylaxis: Full dose Lovenox  code Status: Full code Family Communication:  None present at bedside.  Plan of  care discussed with patient in length and she verbalized understanding and agreed with it. Disposition Plan: To be determined  Consultants:  GI Oncology Palliative care  Procedures:  Status) EGD   Status is: Inpatient    Subjective: Patient seen and examined.  Sitting comfortably on the bed.  Had large bowel movement last night.  No nausea, vomiting.  Continues to have burping and belching.  No blood per rectum.  Remained afebrile.  No acute event overnight. Objective: Vitals:   11/05/22 0406 11/05/22 0411 11/05/22 0500 11/05/22 0800  BP: 122/79     Pulse: 88     Resp: 17 17  18  $ Temp: 98.3 F (36.8 C)     TempSrc: Oral     SpO2: 98% 98%  99%  Weight:   67.4 kg   Height:        Intake/Output Summary (Last 24 hours) at 11/05/2022 1157 Last data filed at 11/05/2022 1019 Gross per 24 hour  Intake 748.21 ml  Output 1350 ml  Net -601.79 ml    Filed Weights   10/31/22 0524 11/03/22 0500 11/05/22 0500  Weight: 67.5 kg 66.1 kg 67.4 kg    Examination:  General exam: Appears calm and comfortable, on nasal cannula, communicating well Respiratory system: Clear to auscultation. Respiratory effort normal. Cardiovascular system: S1 & S2 heard, RRR. No JVD, murmurs, rubs, gallops or clicks. No pedal edema. Gastrointestinal system: Abdomen is distended, soft and nontender. No organomegaly or masses felt.  Bowel sounds positive.   Central nervous system: Alert and oriented. No focal neurological deficits. Extremities: Symmetric 5 x 5 power. Skin: No rashes, lesions or ulcers Psychiatry: Judgement and insight appear normal. Mood & affect appropriate.    Data Reviewed: I have personally reviewed following labs and imaging studies  CBC: Recent Labs  Lab 11/01/22 0512 11/02/22 0521 11/03/22 0345 11/04/22 0638 11/05/22 0414  WBC 9.0 7.9 6.6 4.4 3.5*  HGB 7.1* 7.2* 8.6* 8.7* 8.1*  HCT 23.7* 24.1* 27.4* 29.0* 26.2*  MCV 75.5* 74.8* 76.8* 79.2* 76.6*  PLT 427* 380 265 225  [id number]    Basic Metabolic Panel: Recent Labs  Lab 10/30/22 0509 10/31/22 0520 11/01/22 0512 11/03/22 0345 11/04/22 0638 11/05/22 0414  NA 127* 127* 130* 130* 135 134*  K 4.1 4.4 4.4 3.9 3.6 3.8  CL 90* 96* 97* 99 109 101  CO2 26 23 22 23 22 24  $ GLUCOSE 174* 188* 215* 232* 176* 191*  BUN 25* 26* 20 19 24* 19  CREATININE 0.64 0.56 0.50 0.35* 0.54 0.43*  CALCIUM 7.8* 7.9* 8.3* 7.8* 7.6* 8.0*  MG 2.4 2.3 1.9  --  2.1 1.9  PHOS 3.5 4.0 3.3  --  3.4 2.9    GFR: Estimated Creatinine Clearance: 71.8 mL/min (A) (by C-G formula based on SCr of 0.43 mg/dL (L)). Liver Function Tests: Recent Labs  Lab 10/30/22 0509 10/31/22 0520 11/01/22 0512 11/04/22 0638 11/05/22 0414  AST 8* 13* 19 31 48*  ALT 14 15 17 $ 66* 116*  ALKPHOS 88 89 80 113 117  BILITOT 0.4 0.3 0.1* 0.2* 0.3  PROT 5.6* 5.4* 5.8* 4.9* 4.7*  ALBUMIN 2.1* 1.9* 1.8* 1.7* 1.6*    No results for input(s): "LIPASE", "AMYLASE" in the last 168 hours. No results for input(s): "AMMONIA" in the last 168 hours. Coagulation Profile: No results for input(s): "INR", "PROTIME"  in the last 168 hours. Cardiac Enzymes: No results for input(s): "CKTOTAL", "CKMB", "CKMBINDEX", "TROPONINI" in the last 168 hours. BNP (last 3 results) No results for input(s): "PROBNP" in the last 8760 hours. HbA1C: No results for input(s): "HGBA1C" in the last 72 hours. CBG: Recent Labs  Lab 11/04/22 2002 11/05/22 0018 11/05/22 0403 11/05/22 0748 11/05/22 1113  GLUCAP 181* 140* 162* 98 165*    Lipid Profile: Recent Labs    11/05/22 0414  TRIG 99    Thyroid Function Tests: No results for input(s): "TSH", "T4TOTAL", "FREET4", "T3FREE", "THYROIDAB" in the last 72 hours. Anemia Panel: No results for input(s): "VITAMINB12", "FOLATE", "FERRITIN", "TIBC", "IRON", "RETICCTPCT" in the last 72 hours. Sepsis Labs: No results for input(s): "PROCALCITON", "LATICACIDVEN" in the last 168 hours.  No results found for this or any previous visit (from  the past 240 hour(s)).    Radiology Studies: No results found.  Scheduled Meds:  (feeding supplement) PROSource Plus  30 mL Oral BID BM   amLODipine  10 mg Oral Daily   Chlorhexidine Gluconate Cloth  6 each Topical Daily   clonazepam  0.25 mg Oral BID   dexamethasone (DECADRON) injection  4 mg Intravenous Daily   enoxaparin (LOVENOX) injection  1 mg/kg Subcutaneous Q12H   feeding supplement  1 Container Oral TID BM   fentaNYL  1 patch Transdermal Q72H   ferrous sulfate  325 mg Oral Q breakfast   furosemide  40 mg Intravenous Daily   HYDROmorphone   Intravenous Q4H   [START ON 11/06/2022] HYDROmorphone   Intravenous Q4H   insulin aspart  0-15 Units Subcutaneous Q4H   losartan  50 mg Oral Daily   metoCLOPramide (REGLAN) injection  5 mg Intravenous Q8H   pantoprazole (PROTONIX) IV  40 mg Intravenous Q24H   polyethylene glycol  17 g Oral Q8H   pregabalin  25 mg Oral QHS   senna-docusate  2 tablet Oral QHS   sodium chloride flush  10-40 mL Intracatheter Q12H   spironolactone  25 mg Oral Daily   Continuous Infusions:  TPN ADULT (ION) 65 mL/hr at 11/05/22 0220   TPN ADULT (ION)       LOS: 16 days   Time spent: 35 minutes   Megon Kalina Loann Quill, MD [facility]  If 7PM-7AM, please contact night-coverage [email or web address] 11/05/2022, 11:57 AM

## 2022-11-05 NOTE — Progress Notes (Signed)
Christina [name]   DOB:March 25, 1976   [id number]   [id number]  Med/onc follow up note  Subjective: Patient did have large bowel movement over the weekend, abdomen is less bloated, pain is controlled, she is on fentanyl patch now.  She does feel very fatigued, likely from chemo.  No other new complaints.    Vitals:   11/05/22 1301 11/05/22 1344  BP:  112/73  Pulse:  84  Resp: 15 18  Temp:  98.2 F (36.8 C)  SpO2:  98%    Body mass index is 30.01 kg/m.  Intake/Output Summary (Last 24 hours) at 11/05/2022 1641 Last data filed at 11/05/2022 1019 Gross per 24 hour  Intake 748.21 ml  Output 1350 ml  Net -601.79 ml     Sclerae unicteric  Oropharynx clear  No peripheral adenopathy  Lungs clear -- no rales or rhonchi  Heart regular rate and rhythm  Abdomen distended with mild diffuse tenderness.  MSK no focal spinal tenderness, no peripheral edema  Neuro nonfocal    CBG (last 3)  Recent Labs    11/05/22 0748 11/05/22 1113 11/05/22 1621  GLUCAP 98 165* 181*     Labs:   Urine Studies No results for input(s): "UHGB", "CRYS" in the last 72 hours.  Invalid input(s): "UACOL", "UAPR", "USPG", "UPH", "UTP", "UGL", "UKET", "UBIL", "UNIT", "UROB", "ULEU", "UEPI", "UWBC", "URBC", "UBAC", "CAST", "UCOM", "BILUA"  Basic Metabolic Panel: Recent Labs  Lab 10/30/22 0509 10/31/22 0520 11/01/22 0512 11/03/22 0345 11/04/22 0638 11/05/22 0414  NA 127* 127* 130* 130* 135 134*  K 4.1 4.4 4.4 3.9 3.6 3.8  CL 90* 96* 97* 99 109 101  CO2 26 23 22 23 22 24  $ GLUCOSE 174* 188* 215* 232* 176* 191*  BUN 25* 26* 20 19 24* 19  CREATININE 0.64 0.56 0.50 0.35* 0.54 0.43*  CALCIUM 7.8* 7.9* 8.3* 7.8* 7.6* 8.0*  MG 2.4 2.3 1.9  --  2.1 1.9  PHOS 3.5 4.0 3.3  --  3.4 2.9   GFR Estimated Creatinine Clearance: 71.8 mL/min (A) (by C-G formula based on SCr of 0.43 mg/dL (L)). Liver Function Tests: Recent Labs  Lab 10/30/22 0509 10/31/22 0520 11/01/22 0512 11/04/22 0638 11/05/22 0414   AST 8* 13* 19 31 48*  ALT 14 15 17 $ 66* 116*  ALKPHOS 88 89 80 113 117  BILITOT 0.4 0.3 0.1* 0.2* 0.3  PROT 5.6* 5.4* 5.8* 4.9* 4.7*  ALBUMIN 2.1* 1.9* 1.8* 1.7* 1.6*   No results for input(s): "LIPASE", "AMYLASE" in the last 168 hours. No results for input(s): "AMMONIA" in the last 168 hours. Coagulation profile No results for input(s): "INR", "PROTIME" in the last 168 hours.   CBC: Recent Labs  Lab 11/01/22 0512 11/02/22 0521 11/03/22 0345 11/04/22 0638 11/05/22 0414  WBC 9.0 7.9 6.6 4.4 3.5*  HGB 7.1* 7.2* 8.6* 8.7* 8.1*  HCT 23.7* 24.1* 27.4* 29.0* 26.2*  MCV 75.5* 74.8* 76.8* 79.2* 76.6*  PLT 427* 380 265 225 171   Cardiac Enzymes: No results for input(s): "CKTOTAL", "CKMB", "CKMBINDEX", "TROPONINI" in the last 168 hours. BNP: Invalid input(s): "POCBNP" CBG: Recent Labs  Lab 11/05/22 0018 11/05/22 0403 11/05/22 0748 11/05/22 1113 11/05/22 1621  GLUCAP 140* 162* 98 165* 181*   D-Dimer No results for input(s): "DDIMER" in the last 72 hours. Hgb A1c No results for input(s): "HGBA1C" in the last 72 hours. Lipid Profile Recent Labs    11/05/22 0414  TRIG 99   Thyroid function studies No results for input(s): "TSH", "T4TOTAL", "  T3FREE", "THYROIDAB" in the last 72 hours.  Invalid input(s): "FREET3" Anemia work up No results for input(s): "VITAMINB12", "FOLATE", "FERRITIN", "TIBC", "IRON", "RETICCTPCT" in the last 72 hours. Microbiology No results found for this or any previous visit (from the past 240 hour(s)).     Studies:  No results found.  Assessment: 47 y.o. 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 Malignant ascites status post paracentesisX3 Recurrent N/V and constipation, secondary to #1, partial bowel obstruction due to the extensive peritoneal metastasis Moderate protein and calorie malnutrition Small PE    Plan:  -Lab  reviewed. -Pain management per palliative care -Her next cycle of chemotherapy is due next week, February 28 -Discharge planning per palliative care and primary team, no additional oncology workup is scheduled. -I will refer her to our cancer center office in [place] when she is ready to be discharged.   Truitt Merle, MD 11/05/2022

## 2022-11-05 NOTE — Progress Notes (Signed)
[facility] Gastroenterology Progress Note  Christina [name] 47 y.o. 15-Jan-1976  CC: Metastatic adenocarcinoma   Subjective: Patient seen and examined at bedside.  Denies any new GI symptoms.  Currently having regular bowel movements with laxatives.  ROS : Afebrile, negative for vomiting.   Objective: Vital signs in last 24 hours: Vitals:   11/05/22 0411 11/05/22 0800  BP:    Pulse:    Resp: 17 18  Temp:    SpO2: 98% 99%    Physical Exam:  General:  Alert, cooperative, no distress, appears stated age  Head:  Normocephalic, without obvious abnormality, atraumatic  Eyes:  , EOM's intact,   Lungs:   No visible respiratory distress  Heart:  Regular rate and rhythm, S1, S2 normal  Abdomen:   Abdomen is distended, bowel sounds are present but hypoactive, no peritoneal signs  Extremities: Extremities normal, atraumatic, no  edema  Pulses: 2+ and symmetric    Lab Results: Recent Labs    11/04/22 0638 11/05/22 0414  NA 135 134*  K 3.6 3.8  CL 109 101  CO2 22 24  GLUCOSE 176* 191*  BUN 24* 19  CREATININE 0.54 0.43*  CALCIUM 7.6* 8.0*  MG 2.1 1.9  PHOS 3.4 2.9   Recent Labs    11/04/22 0638 11/05/22 0414  AST 31 48*  ALT 66* 116*  ALKPHOS 113 117  BILITOT 0.2* 0.3  PROT 4.9* 4.7*  ALBUMIN 1.7* 1.6*   Recent Labs    11/04/22 0638 11/05/22 0414  WBC 4.4 3.5*  HGB 8.7* 8.1*  HCT 29.0* 26.2*  MCV 79.2* 76.6*  PLT 225 171   No results for input(s): "LABPROT", "INR" in the last 72 hours.    Assessment/Plan: -Metastatic adenocarcinoma with peritoneal carcinomatosis.  CT scan showed large cystic lesion in the pancreatic tail as well as bilateral ovarian lesion.  -Malignant ascites.  Immunohistochemical stains suggestive of upper GI or pancreatic biliary primary.  EGD on February 9 showed gastric compression in the antrum, mild esophagitis and gastritis.  Biopsies were negative for malignancy.  EUS for yesterday was canceled because of worsening abdominal  distention. -Possible early small bowel obstruction/ileus -no bowel movement since Thursday.  Recommendations ------------------------ -Continue MiraLAX as needed to avoid constipation and ileus. -Discussed with Dr. Paulita Fujita yesterday.  Based on his opinion, he does not think EUS will be high yield procedure.  If oncology thinks EUS and its finding may change his management, EUS can be done later as an outpatient once her overall clinical status improves.  -No further inpatient GI workup planned.  Call us back if needed.  Otis Brace MD, [place] 11/05/2022, 10:11 AM  Contact #  [phone]

## 2022-11-05 NOTE — Progress Notes (Signed)
PHARMACY - TOTAL PARENTERAL NUTRITION CONSULT NOTE   Indication:  gastric outlet obstruction with extrinsic compression   Patient Measurements: Height: 4' 11"$  (149.9 cm) Weight: 67.4 kg (148 lb 9.4 oz) IBW/kg (Calculated) : 43.2 TPN AdjBW (KG): 49.8 Body mass index is 30.01 kg/m. 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 Malignant ascites s/p paracentesis x 3. EGD: gastric outlet obstruction w/ extrinsic compression. Pharmacy consulted to manage TPN for nutrition support.   Glucose / Insulin: no hx DM. CBGs:  98-181 with one outlier 268 (goal < 150).  - SSI / 24 hrs:  20 units - Insulin in TPN:  18 units - Dexamethasone 65m IV x1 on 2/14, then (start 2/16) 466mIV daily  Electrolytes:  Na improved 134 (max Na concentration in TPN), Others WNL including K, Phos, Mag, CorrCa 9.9 Renal: SCr low Hepatic: ALT 17>66>116 large jump, AST 31>48, Alk Phos WNL, Tbili low Triglycerides: WNL  2/19 Intake / Output; MIVF: I/O incomplete, unmeasured UOP x4 - No IVF.  Lasix 4024mO daily. Spiro25m60m  - po 120 ml recorded - LBM 2/18 Other: Reglan 5 mg 3 times daily, PPI, Decadron 4 mg for inflammation and nausea. No NG unless she has vomiting.   GI Imaging:  2/11 CT abd/pelvis: Multiple dilated loops of small bowel have developed which may represent ileus versus partial or developing small bowel obstruction. Numerous thickened, hyperenhancing loops of distal small bowel, suggestive of infectious or inflammatory enteritis. Abnormal wall thickening of the gastric antrum, which could be secondary to gastritis or malignancy. Large bilateral multi-cystic adnexal masses concerning for primary or metastatic ovarian malignancy. Multiloculated cystic lesion of the pancreatic tail. 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 2/12 abdominal X-ray: Persistent moderate  gas distension of large and small bowel suggestive of ileus vs. partial obstruction  GI Surgeries / Procedures:  2/9 EGD: gastric outlet obstruction with extrinsic compression. 2/16 IR: paracentesis 3.5L out  Central access: single lumen PAC placed on 2/9 for chemo, PICC placed on 2/12 for TPN TPN start date: 2/12 - 2/11: [name] unable to compound TPN today.  Will start 2/12, Dr AdhiTawanna SoloN informed.   Nutritional Goals: Goal TPN rate is 65 mL/hr (provides 86 g of protein and 1607 kcals per day)  RD Assessment:  Estimated Needs Total Energy Estimated Needs: 1500-1700 Total Protein Estimated Needs: 75-90g Total Fluid Estimated Needs: 1.5L/day  Current Nutrition:  TPN Diet: CLD (2/17)  Plan:  Continue TPN at goal rate of 65 mL/hr. No changes today Electrolytes in TPN:  Na 150 mEq/L (maxed) K 20 mEq/L Ca 5mEq28mMg 2 mEq/L Phos 10 mmol/L Ac:Cl ratio  1:1 Keep Insulin 20 units in bag (glucose only 98 this AM) Add standard MVI and trace elements to TPN Add folic acid to TPN Continue Moderate q4h SSI and adjust as needed  Monitor TPN labs on Mon/Thurs and prn  Chrishawn Boley S. RoberAlford HighlandrmD, BCPS Clinical Staff Pharmacist [email or web address] 11/05/2022 8:17 AM

## 2022-11-06 DIAGNOSIS — C786 Secondary malignant neoplasm of retroperitoneum and peritoneum: Secondary | ICD-10-CM | POA: Diagnosis not present

## 2022-11-06 LAB — COMPREHENSIVE METABOLIC PANEL
ALT: 88 U/L — ABNORMAL HIGH (ref 0–44)
AST: 25 U/L (ref 15–41)
Albumin: 1.6 g/dL — ABNORMAL LOW (ref 3.5–5.0)
Alkaline Phosphatase: 117 U/L (ref 38–126)
Anion gap: 10 (ref 5–15)
BUN: 20 mg/dL (ref 6–20)
CO2: 25 mmol/L (ref 22–32)
Calcium: 7.8 mg/dL — ABNORMAL LOW (ref 8.9–10.3)
Chloride: 97 mmol/L — ABNORMAL LOW (ref 98–111)
Creatinine, Ser: 0.4 mg/dL — ABNORMAL LOW (ref 0.44–1.00)
GFR, Estimated: >60 mL/min (ref >60)
Glucose, Bld: 269 mg/dL — ABNORMAL HIGH (ref 70–99)
Potassium: 3.7 mmol/L (ref 3.5–5.1)
Sodium: 132 mmol/L — ABNORMAL LOW (ref 135–145)
Total Bilirubin: 0.4 mg/dL (ref 0.3–1.2)
Total Protein: 4.6 g/dL — ABNORMAL LOW (ref 6.5–8.1)

## 2022-11-06 LAB — CBC
HCT: 25.7 % — ABNORMAL LOW (ref 36.0–46.0)
Hemoglobin: 8 g/dL — ABNORMAL LOW (ref 12.0–15.0)
MCH: 23.9 pg — ABNORMAL LOW (ref 26.0–34.0)
MCHC: 31.1 g/dL (ref 30.0–36.0)
MCV: 76.7 fL — ABNORMAL LOW (ref 80.0–100.0)
Platelets: 138 10*3/uL — ABNORMAL LOW (ref 150–400)
RBC: 3.35 MIL/uL — ABNORMAL LOW (ref 3.87–5.11)
RDW: 18.4 % — ABNORMAL HIGH (ref 11.5–15.5)
WBC: 2.2 10*3/uL — ABNORMAL LOW (ref 4.0–10.5)
nRBC: 0 % (ref 0.0–0.2)

## 2022-11-06 LAB — GLUCOSE, CAPILLARY
Glucose-Capillary: 125 mg/dL — ABNORMAL HIGH (ref 70–99)
Glucose-Capillary: 144 mg/dL — ABNORMAL HIGH (ref 70–99)
Glucose-Capillary: 148 mg/dL — ABNORMAL HIGH (ref 70–99)
Glucose-Capillary: 152 mg/dL — ABNORMAL HIGH (ref 70–99)
Glucose-Capillary: 174 mg/dL — ABNORMAL HIGH (ref 70–99)
Glucose-Capillary: 241 mg/dL — ABNORMAL HIGH (ref 70–99)

## 2022-11-06 MED ORDER — TRAVASOL 10 % IV SOLN
INTRAVENOUS | Status: AC
  Filled 2022-11-06: qty 858

## 2022-11-06 NOTE — Progress Notes (Signed)
PROGRESS NOTE    Christina [name]  [id number] DOB: September 13, 1976 DOA: 10/19/2022 PCP: [name], MD   Brief Narrative:  Patient is a 47 year old female with history of anxiety, HPV, who presented with complaint of abdominal distention, constipation.  CT abdomen/pelvis showed peritoneal carcinomatosis with extensive metastasis, ascites, multiloculated cystic lesion of the pancreatic tail.  GYN oncology consulted initially .  Underwent paracentesis x 3.  Tumor markers are elevated :CA125, CEA.  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  Oncology, GI following.  S/p  EGD, plan for EUS/biopsy as per GI but has been postponed.  Started  on TPN, PCA pump. Hospital course complicated by persistent abdominal pain, distention, constipation, poor oral intake.  Palliative care also following for goals of care.  Oncology started chemotherapy.  11/06/22:  Patient was seen and examined at her bedside.  Pain is controlled on PCA pump.   Assessment & Plan: Metastatic adenocarcinoma with peritoneal carcinomatosis:  -With large cystic lesion in pancreatic tail and bilateral ovaries -Concern of extensive metastatic disease with unknown primary as seen on the CT scan.  CT concerning for bilateral cystic/solid ovarian masses, pancreatic lesion also noted.  CEA, CA125 elevated, CA 19-9 is normal. -Cytology report from paracentesis showed adenocarcinoma most likely from upper GI or pancreaticobiliary origin.   -Medical oncology on board and palliative care also consulted regarding poor prognosis.   -GI consulted, status post EGD on 2/9.   -EGD was negative for malignancy in the esophagus, stomach or duodenum but was found to have significant extrinsic compression  in the gastric antrum.  -EUS was initially planned however it was canceled later due to abdominal distention.  -Now GI does not think EUS will be high yield procedure and it's finding  may not change patient's management.  EUS can be done later as an outpatient once her symptoms improved. - Underwent Port-A-Cath placement on 2/9. Oncology started chemotherapy.  Pain management Goals of care: -Palliative on board-recommend Reglan 5 mg 3 times daily, PPI, Decadron 4 mg for inflammation and nausea.  No NG unless she has vomiting. On PCA pump.   Malignant ascites:  -Underwent paracentesis on 2/3 , 2/6,  2/10, 2/16.   Continue supportive care.   -cont. spironolactone, IV Lasix.    Constipation/abdominal distention:Abdominal x-ray done multiple times showing ileus, possible partial obstruction.  -NG tube placed on 2/12 however later close removed due to patient's discomfort -Abdomen x-ray from 1/16 shows mild small bowel obstruction bowel gas pattern.  -GI increase MiraLAX to 3 times daily.  Switched clear liquid to full liquid diet today -Had a large bowel movement last night.   -Receiving Reglan IV 5 mg every 8 hours scheduled.  Pulmonary embolism:  -Asymptomatic.  CT chest done with contrast for staging purpose showed small embolus on the proximal right lower lobe pulmonary artery.  Currently on full dose Lovenox.  Will change Eliquis when appropriate.  She is on room air and intermittent [place]   Microcytic anemia/iron deficiency/folic acid deficiency:  -Continue iron supplement, folic acid supplement.  Monitor  hemoglobin.  Currently in the range of 7-8.  S/p 1 unit PRBC transfusion on 2/16.  H&H is stable this a.m.   -Denies any hematochezia or melena.   Moderate protein calorie malnutrition:  Hypoalbuminemia:  -Nutritionist consulted and following.   on  TPN, s/p PICC line placement   Resolved post repletion: Hypokalemia/hypomagnesemia:   Hypertension: On amlodipine 10 mg , losartan  Continue to  closely monitor vital signs.  Hyponatremia: Slight downtrend of serum sodium.  Continue to monitor.  Elevated liver enzymes: Likely in the setting of TPN -Continue to  monitor  DVT prophylaxis: Full dose subcu Lovenox twice daily code Status: Full code Family Communication: None at bedside.   Disposition Plan: To be determined  Consultants:  GI Oncology Palliative care  Procedures:  Status) EGD   Status is: Inpatient   Objective: Vitals:   11/06/22 0419 11/06/22 0824 11/06/22 1317 11/06/22 1337  BP: 111/83   126/88  Pulse: 91   90  Resp: 18 20 16 18  $ Temp: 98.4 F (36.9 C)   98.7 F (37.1 C)  TempSrc: Oral   Oral  SpO2: 97% 98% 97% 98%  Weight:      Height:        Intake/Output Summary (Last 24 hours) at 11/06/2022 1720 Last data filed at 11/06/2022 1433 Gross per 24 hour  Intake --  Output 2250 ml  Net -2250 ml   Filed Weights   10/31/22 0524 11/03/22 0500 11/05/22 0500  Weight: 67.5 kg 66.1 kg 67.4 kg    Examination:  General exam: Well-developed well-nourished in no acute distress.  She is alert and oriented x 3.   Respiratory system: Clear to auscultation no wheezes or rales. Cardiovascular system: Regular rate and rhythm no rubs or gallops.   Gastrointestinal system: Abdomen is distended.  Bowel sounds present. Central nervous system: Alert and oriented. No focal neurological deficits. Extremities: Symmetric 5 x 5 power. Skin: No rashes, lesions or ulcers Psychiatry: Mood is appropriate for condition and setting.  Data Reviewed: I have personally reviewed following labs and imaging studies  CBC: Recent Labs  Lab 11/02/22 0521 11/03/22 0345 11/04/22 0638 11/05/22 0414 11/06/22 0521  WBC 7.9 6.6 4.4 3.5* 2.2*  HGB 7.2* 8.6* 8.7* 8.1* 8.0*  HCT 24.1* 27.4* 29.0* 26.2* 25.7*  MCV 74.8* 76.8* 79.2* 76.6* 76.7*  PLT 380 265 225 171 0000000*   Basic Metabolic Panel: Recent Labs  Lab 10/31/22 0520 11/01/22 0512 11/03/22 0345 11/04/22 0638 11/05/22 0414 11/06/22 0521  NA 127* 130* 130* 135 134* 132*  K 4.4 4.4 3.9 3.6 3.8 3.7  CL 96* 97* 99 109 101 97*  CO2 23 22 23 22 24 25  $ GLUCOSE 188* 215* 232* 176*  191* 269*  BUN 26* 20 19 24* 19 20  CREATININE 0.56 0.50 0.35* 0.54 0.43* 0.40*  CALCIUM 7.9* 8.3* 7.8* 7.6* 8.0* 7.8*  MG 2.3 1.9  --  2.1 1.9  --   PHOS 4.0 3.3  --  3.4 2.9  --    GFR: Estimated Creatinine Clearance: 71.8 mL/min (A) (by C-G formula based on SCr of 0.4 mg/dL (L)). Liver Function Tests: Recent Labs  Lab 10/31/22 0520 11/01/22 0512 11/04/22 0638 11/05/22 0414 11/06/22 0521  AST 13* 19 31 48* 25  ALT 15 17 66* 116* 88*  ALKPHOS 89 80 113 117 117  BILITOT 0.3 0.1* 0.2* 0.3 0.4  PROT 5.4* 5.8* 4.9* 4.7* 4.6*  ALBUMIN 1.9* 1.8* 1.7* 1.6* 1.6*   No results for input(s): "LIPASE", "AMYLASE" in the last 168 hours. No results for input(s): "AMMONIA" in the last 168 hours. Coagulation Profile: No results for input(s): "INR", "PROTIME" in the last 168 hours. Cardiac Enzymes: No results for input(s): "CKTOTAL", "CKMB", "CKMBINDEX", "TROPONINI" in the last 168 hours. BNP (last 3 results) No results for input(s): "PROBNP" in the last 8760 hours. HbA1C: No results for input(s): "HGBA1C" in the last 72  hours. CBG: Recent Labs  Lab 11/06/22 0024 11/06/22 0421 11/06/22 0730 11/06/22 1121 11/06/22 1639  GLUCAP 148* 125* 144* 152* 241*   Lipid Profile: Recent Labs    11/05/22 0414  TRIG 99   Thyroid Function Tests: No results for input(s): "TSH", "T4TOTAL", "FREET4", "T3FREE", "THYROIDAB" in the last 72 hours. Anemia Panel: No results for input(s): "VITAMINB12", "FOLATE", "FERRITIN", "TIBC", "IRON", "RETICCTPCT" in the last 72 hours. Sepsis Labs: No results for input(s): "PROCALCITON", "LATICACIDVEN" in the last 168 hours.  No results found for this or any previous visit (from the past 240 hour(s)).    Radiology Studies: No results found.  Scheduled Meds:  (feeding supplement) PROSource Plus  30 mL Oral BID BM   amLODipine  10 mg Oral Daily   Chlorhexidine Gluconate Cloth  6 each Topical Daily   clonazepam  0.25 mg Oral BID   dexamethasone (DECADRON)  injection  4 mg Intravenous Daily   enoxaparin (LOVENOX) injection  1 mg/kg Subcutaneous Q12H   feeding supplement  1 Container Oral TID BM   fentaNYL  1 patch Transdermal Q72H   ferrous sulfate  325 mg Oral Q breakfast   furosemide  40 mg Intravenous Daily   HYDROmorphone   Intravenous Q4H   insulin aspart  0-15 Units Subcutaneous Q4H   losartan  50 mg Oral Daily   metoCLOPramide (REGLAN) injection  5 mg Intravenous Q8H   pantoprazole (PROTONIX) IV  40 mg Intravenous Q24H   polyethylene glycol  17 g Oral Q8H   pregabalin  25 mg Oral QHS   senna-docusate  2 tablet Oral QHS   sodium chloride flush  10-40 mL Intracatheter Q12H   spironolactone  25 mg Oral Daily   Continuous Infusions:  TPN ADULT (ION) 65 mL/hr at 11/05/22 1737   TPN ADULT (ION)       LOS: 17 days   Time spent: 35 minutes   Kayleen Memos, MD [facility]  If 7PM-7AM, please contact night-coverage [email or web address] 11/06/2022, 5:21 PM

## 2022-11-06 NOTE — Progress Notes (Signed)
Daily Progress Note   Patient Name: Christina [name]       Date: 11/06/2022 DOB: November 05, 1975  Age: 47 y.o. MRN#: [id number] Attending Physician: Kayleen Memos, DO Primary Care Physician: [name], MD Admit Date: 10/19/2022 Length of Stay: 17 days  Reason for Consultation/Follow-up: Establishing goals of care and Symptom Management  Subjective:   CC: Patient noting feeling tired today due to multiple awakenings overnight. Following up regarding symptom management.   Subjective:  PCA needs noted.  Now on fentanyl patch 50 mcg change Q 72 hours.  Also on  Lyrica 25 mg daily.Patient also receiving Klonopin 0.22m BID, dexamethasone 487mdaily, and Reglan q6hr scheduled.   When seeing patient this morning, she is laying  in bed, complains of abdominal "cramps" today, we discussed about no basal rate and her being on fentanyl patch for long acting pain medication. Additionally, plans are to d/c PCA and for her to be on PO Hydromorphone for breakthrough pain and short-acting opioid.   Requests to continue with PCA for today due to abdominal spasms today.     Review of Systems Feeling tired today Objective:   Vital Signs:  BP 111/83 (BP Location: Left Arm)   Pulse 91   Temp 98.4 F (36.9 C) (Oral)   Resp 20   Ht 4' 11"$  (1.499 m)   Wt 67.4 kg   LMP 10/12/2022   SpO2 98%   BMI 30.01 kg/m   Physical Exam: General: NAD, alert, laying in bed Eyes: no drainage noted HENT: dry mucous membranes Cardiovascular: RRR Respiratory: no increased work of breathing noted, not in respiratory distress Abdomen: distended Extremities: moving Ues appropriately  Skin: no rashes or lesions on visible skin Neuro: A&Ox4, following commands easily Psych: appropriately answers all questions  Imaging:  I personally reviewed recent imaging.   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  Palliative medicine team consulted to assist with symptom management.   Recommendations/Plan: # Complex medical decision making/goals of care:  -Continuing with full scope of care at this time.[facility] continue supporting goals for medical care moving forward.                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 Attempt to transition to more sustainable outpatient pain regimen.    - Fentanyl patch 50 mcg at noon today                - CONTINUE IV dilaudid 0.47m59m15 mins bolus dosing for breakthrough management, for another 24 hours.    -Will consider PO hydromorphone on 11-07-22.    -Continue dexamethasone 4mg34mily at this time. Consider transition to po in next 1-2 days then taper if appropraite.   -NO NG tube unless has vomiting    Anxiety,  in setting of recent cancer diagnosis  -Continue Klonopin 0.46m BID                 -Constipation, acute on chronic in setting of malignant ascites  -Receiving Reglan IV [id number] scheduled. Consider transition to po in next 1-2 days then taper if appropraite.   -On Miralax TID as per GI.  # Discharge Planning: TBD  -[facility] Please reach out to provider AtJobe Gibbont time of discharge to inform of this as will need close follow up.   Discussed with: patient   Thank you  for allowing the palliative care team to participate in the care DeDonna Christen Mod MDM ZeLoistine ChanceD Palliative Care Provider PMT # [phone] patient remains symptomatic despite maximum doses, please call PMT at [phone] 0700 and 1900. Outside of these hours, please call attending, as PMT does not have night coverage.

## 2022-11-06 NOTE — Progress Notes (Addendum)
PHARMACY - TOTAL PARENTERAL NUTRITION CONSULT NOTE   Indication:  gastric outlet obstruction with extrinsic compression   Patient Measurements: Height: 4' 11"$  (149.9 cm) Weight: 67.4 kg (148 lb 9.4 oz) IBW/kg (Calculated) : 43.2 TPN AdjBW (KG): 49.8 Body mass index is 30.01 kg/m. 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 Malignant ascites s/p paracentesis x 3. EGD: gastric outlet obstruction w/ extrinsic compression. Pharmacy consulted to manage TPN for nutrition support.   Glucose / Insulin: no hx DM. CBGs:  98-181 (goal < 180). But 269 on BMET this AM? Latest CBG 144. Now on steroids - SSI / 24 hrs:  12 units - Insulin in TPN:  18 units - Dexamethasone 44m IV x1 on 2/14, then (start 2/16) 434mIV daily  Electrolytes:  Na 132 (max Na concentration in TPN), Cl slightly low CorrCa 9.72 Renal: SCr low 0.4 Hepatic: ALT 17>66>116 large jump>88,  AST 31>48>25, Alk Phos WNL, Tbili low Triglycerides: WNL  2/19 Intake / Output; MIVF: I/O incomplete, unmeasured UOP - No IVF.  Lasix 4045mO daily. Spiro25m46m  - po 120 ml recorded - LBM 2/19 Other: Reglan 5 mg IV TID, IV PPI/24h, Decadron 4 mg for inflammation and nausea. Miralax/8hr, SennaS/hs No NG unless she has vomiting.   GI Imaging:  2/11 CT abd/pelvis: Multiple dilated loops of small bowel have developed which may represent ileus versus partial or developing small bowel obstruction. Numerous thickened, hyperenhancing loops of distal small bowel, suggestive of infectious or inflammatory enteritis. Abnormal wall thickening of the gastric antrum, which could be secondary to gastritis or malignancy. Large bilateral multi-cystic adnexal masses concerning for primary or metastatic ovarian malignancy. Multiloculated cystic lesion of the pancreatic tail. Peritoneal and omental nodularity compatible with  carcinomatosis. 2/12 abdominal X-ray: Persistent moderate gas distension of large and small bowel suggestive of ileus vs. partial obstruction  GI Surgeries / Procedures:  2/9 EGD: gastric outlet obstruction with extrinsic compression. 2/16 IR: paracentesis 3.5L out  Central access:  - 2/9: single lumen PAC for chemo - 2/12: PICC for TPN  TPN start date: 2/12 - 2/11: [name] unable to compound TPN today.  Will start 2/12, Dr AdhiTawanna SoloN informed.   Nutritional Goals: Goal TPN rate is 65 mL/hr (provides 86 g of protein and 1607 kcals per day)  RD Assessment:  Estimated Needs Total Energy Estimated Needs: 1500-1700 Total Protein Estimated Needs: 75-90g Total Fluid Estimated Needs: 1.5L/day  Current Nutrition:  TPN Diet: CLD (2/17) 2/7: Boost/Resource: not taking 2/6: Prosource: not taking   Plan:  Continue TPN at goal rate of 65 mL/hr. No changes today Electrolytes in TPN:  Na 150 mEq/L (maxed) K 20 mEq/L Ca 5mEq18mMg 2 mEq/L Phos 10 mmol/L Cl:Ac ratio  2:1 Con't Insulin 20 units in bag Add standard MVI and trace elements to TPN Add folic acid to TPN Continue Moderate q4h SSI and adjust as needed  Monitor TPN labs on Mon/Thurs and prn (none for 2/21)  Eda Magnussen S. RoberAlford HighlandrmD, BCPS Clinical Staff Pharmacist [email or web address] 11/06/2022 7:35 AM

## 2022-11-07 DIAGNOSIS — C786 Secondary malignant neoplasm of retroperitoneum and peritoneum: Secondary | ICD-10-CM | POA: Diagnosis not present

## 2022-11-07 DIAGNOSIS — R87619 Unspecified abnormal cytological findings in specimens from cervix uteri: Secondary | ICD-10-CM

## 2022-11-07 LAB — COMPREHENSIVE METABOLIC PANEL
ALT: 58 U/L — ABNORMAL HIGH (ref 0–44)
AST: 16 U/L (ref 15–41)
Albumin: 1.7 g/dL — ABNORMAL LOW (ref 3.5–5.0)
Alkaline Phosphatase: 102 U/L (ref 38–126)
Anion gap: 7 (ref 5–15)
BUN: 22 mg/dL — ABNORMAL HIGH (ref 6–20)
CO2: 25 mmol/L (ref 22–32)
Calcium: 7.7 mg/dL — ABNORMAL LOW (ref 8.9–10.3)
Chloride: 100 mmol/L (ref 98–111)
Creatinine, Ser: 0.45 mg/dL (ref 0.44–1.00)
GFR, Estimated: >60 mL/min (ref >60)
Glucose, Bld: 180 mg/dL — ABNORMAL HIGH (ref 70–99)
Potassium: 3.4 mmol/L — ABNORMAL LOW (ref 3.5–5.1)
Sodium: 132 mmol/L — ABNORMAL LOW (ref 135–145)
Total Bilirubin: 0.1 mg/dL — ABNORMAL LOW (ref 0.3–1.2)
Total Protein: 4.3 g/dL — ABNORMAL LOW (ref 6.5–8.1)

## 2022-11-07 LAB — CBC
HCT: 25.4 % — ABNORMAL LOW (ref 36.0–46.0)
Hemoglobin: 7.8 g/dL — ABNORMAL LOW (ref 12.0–15.0)
MCH: 23.6 pg — ABNORMAL LOW (ref 26.0–34.0)
MCHC: 30.7 g/dL (ref 30.0–36.0)
MCV: 76.7 fL — ABNORMAL LOW (ref 80.0–100.0)
Platelets: 129 10*3/uL — ABNORMAL LOW (ref 150–400)
RBC: 3.31 MIL/uL — ABNORMAL LOW (ref 3.87–5.11)
RDW: 18.6 % — ABNORMAL HIGH (ref 11.5–15.5)
WBC: 1.7 10*3/uL — ABNORMAL LOW (ref 4.0–10.5)
nRBC: 0 % (ref 0.0–0.2)

## 2022-11-07 LAB — GLUCOSE, CAPILLARY
Glucose-Capillary: 141 mg/dL — ABNORMAL HIGH (ref 70–99)
Glucose-Capillary: 146 mg/dL — ABNORMAL HIGH (ref 70–99)
Glucose-Capillary: 150 mg/dL — ABNORMAL HIGH (ref 70–99)
Glucose-Capillary: 164 mg/dL — ABNORMAL HIGH (ref 70–99)
Glucose-Capillary: 180 mg/dL — ABNORMAL HIGH (ref 70–99)
Glucose-Capillary: 206 mg/dL — ABNORMAL HIGH (ref 70–99)

## 2022-11-07 MED ORDER — TRAVASOL 10 % IV SOLN
INTRAVENOUS | Status: AC
  Filled 2022-11-07: qty 858

## 2022-11-07 MED ORDER — ENOXAPARIN SODIUM 80 MG/0.8ML IJ SOSY
1.0000 mg/kg | PREFILLED_SYRINGE | Freq: Two times a day (BID) | INTRAMUSCULAR | Status: DC
  Administered 2022-11-07 – 2022-11-16 (×18): 70 mg via SUBCUTANEOUS
  Filled 2022-11-07 (×19): qty 0.8

## 2022-11-07 MED ORDER — FOLIC ACID 1 MG PO TABS
1.0000 mg | ORAL_TABLET | Freq: Every day | ORAL | Status: DC
  Administered 2022-11-08 – 2022-11-17 (×9): 1 mg via ORAL
  Filled 2022-11-07 (×9): qty 1

## 2022-11-07 MED ORDER — HYDROMORPHONE HCL 2 MG PO TABS
2.0000 mg | ORAL_TABLET | ORAL | Status: DC | PRN
  Administered 2022-11-07: 2 mg via ORAL
  Administered 2022-11-07 – 2022-11-10 (×3): 4 mg via ORAL
  Administered 2022-11-15: 2 mg via ORAL
  Filled 2022-11-07: qty 2
  Filled 2022-11-07: qty 1
  Filled 2022-11-07 (×2): qty 2
  Filled 2022-11-07: qty 1
  Filled 2022-11-07 (×2): qty 2

## 2022-11-07 MED ORDER — HYDROMORPHONE HCL 1 MG/ML IJ SOLN
1.0000 mg | INTRAMUSCULAR | Status: DC | PRN
  Administered 2022-11-08 – 2022-11-16 (×32): 1 mg via INTRAVENOUS
  Filled 2022-11-07 (×31): qty 1

## 2022-11-07 MED ORDER — POTASSIUM CHLORIDE 20 MEQ PO PACK
40.0000 meq | PACK | Freq: Once | ORAL | Status: AC
  Administered 2022-11-07: 40 meq via ORAL
  Filled 2022-11-07: qty 2

## 2022-11-07 MED ORDER — POTASSIUM CHLORIDE 10 MEQ/50ML IV SOLN
10.0000 meq | INTRAVENOUS | Status: DC
  Filled 2022-11-07 (×4): qty 50

## 2022-11-07 NOTE — Evaluation (Signed)
Physical Therapy Evaluation Patient Details Name: Christina [name] MRN: [id number] DOB: April 24, 1976 Today's Date: 11/07/2022  History of Present Illness  Christina [name] is a 47 y.o. female admitted with peritoneal carcinomatosis. CT abdomen/pelvis showed peritoneal carcinomatosis with extensive metastasis, ascites, multiloculated cystic lesion of the pancreatic tail. 2/16 Ultrasound-guided  therapeutic paracentesis performed yielding 3.5 liters. PMH: anxiety, HPV  Clinical Impression  Pt admitted with above diagnosis. Pt from home, ind at baseline, quit her job in Dec 2023 due to health reasons, navigating multiple steps to enter/exit single level home. Pt mobilizing to EOB modified ind. Pt powers to stand and ambulates around obstacles in room out to w/c to go with transport, supv with therapist managing IV lines. Will trial longer distances next treatment session and possibly d/c. Foresee no f/u PT needs or equipment.  Pt currently with functional limitations due to the deficits listed below (see PT Problem List). Pt will benefit from skilled PT to increase their independence and safety with mobility to allow discharge to the venue listed below.          Recommendations for follow up therapy are one component of a multi-disciplinary discharge planning process, led by the attending physician.  Recommendations may be updated based on patient status, additional functional criteria and insurance authorization.  Follow Up Recommendations No PT follow up      Assistance Recommended at Discharge PRN  Patient can return home with the following  Assistance with cooking/housework;Help with stairs or ramp for entrance    Equipment Recommendations None recommended by PT  Recommendations for Other Services       Functional Status Assessment Patient has had a recent decline in their functional status and demonstrates the ability to make significant improvements in function in a reasonable and predictable  amount of time.     Precautions / Restrictions Restrictions Weight Bearing Restrictions: No      Mobility  Bed Mobility Overal bed mobility: Modified Independent   Transfers Overall transfer level: Needs assistance  Transfers: Sit to/from Stand Sit to Stand: Supervision  General transfer comment: therapist managing IV lines, slightly increased time    Ambulation/Gait Ambulation/Gait assistance: Supervision Gait Distance (Feet): 12 Feet Assistive device: None Gait Pattern/deviations: Step-through pattern, Decreased stride length Gait velocity: dec  General Gait Details: slow, step through gait pattern from EOB into hallway to w/c to go to cancer center, therapist managing lines  Stairs            Wheelchair Mobility    Modified Rankin (Stroke Patients Only)       Balance Overall balance assessment: No apparent balance deficits (not formally assessed)       Pertinent Vitals/Pain Pain Assessment Pain Assessment: Faces Faces Pain Scale: Hurts little more Pain Location: back Pain Descriptors / Indicators: Discomfort Pain Intervention(s): Limited activity within patient's tolerance, Monitored during session, Premedicated before session    Home Living Family/patient expects to be discharged to:: Private residence Living Arrangements:  (ex spouse, oldest son (33 yr old), 2 roommates in the guest house - all work during the day) Available Help at Discharge: Family;Friend(s);Available PRN/intermittently   Home Access: Stairs to enter Entrance Stairs-Rails: Right;Left Entrance Stairs-Number of Steps: 4-5 back door, 8 front door   Home Layout: One level Home Equipment: Shower seat      Prior Function Prior Level of Function : Independent/Modified Independent;Driving (quit job in Dec 2023, walked ~9000 steps/day)  Mobility Comments: pt reports ind ADLs Comments: pt reports ind  Hand Dominance        Extremity/Trunk Assessment   Upper Extremity  Assessment Upper Extremity Assessment: Defer to OT evaluation    Lower Extremity Assessment Lower Extremity Assessment: Overall WFL for tasks assessed (AROM WFL, strength grossly 4/5, chronic RLE numbness from injury in 20s)    Cervical / Trunk Assessment Cervical / Trunk Assessment: Normal  Communication   Communication: No difficulties  Cognition Arousal/Alertness: Awake/alert Behavior During Therapy: WFL for tasks assessed/performed Overall Cognitive Status: Within Functional Limits for tasks assessed     General Comments      Exercises     Assessment/Plan    PT Assessment Patient needs continued PT services  PT Problem List Decreased activity tolerance;Impaired sensation       PT Treatment Interventions DME instruction;Gait training;Stair training;Functional mobility training;Therapeutic activities;Therapeutic exercise;Balance training;Patient/family education    PT Goals (Current goals can be found in the Care Plan section)  Acute Rehab PT Goals Patient Stated Goal: none stated PT Goal Formulation: With patient Time For Goal Achievement: 11/21/22 Potential to Achieve Goals: Good    Frequency Min 3X/week     Co-evaluation               AM-PAC PT "6 Clicks" Mobility  Outcome Measure Help needed turning from your back to your side while in a flat bed without using bedrails?: None Help needed moving from lying on your back to sitting on the side of a flat bed without using bedrails?: None Help needed moving to and from a bed to a chair (including a wheelchair)?: None Help needed standing up from a chair using your arms (e.g., wheelchair or bedside chair)?: None Help needed to walk in hospital room?: A Little Help needed climbing 3-5 steps with a railing? : A Little 6 Click Score: 22    End of Session   Activity Tolerance: Patient tolerated treatment well Patient left: Other (comment) (in w/c going to cancer center with transport staff) Nurse  Communication: Mobility status PT Visit Diagnosis: Other abnormalities of gait and mobility (R26.89)    Time: [id number] PT Time Calculation (min) (ACUTE ONLY): 9 min   Charges:   PT Evaluation $PT Eval Low Complexity: 1 Low           Tori Kenyen Candy PT, DPT 11/07/22, 2:09 PM

## 2022-11-07 NOTE — Progress Notes (Signed)
Called [name] on chart as requested before she left from visiting patient and permission was given by Hoyt Koch to give information to [place] of the plan and what explained Christina [name]'s underlying disease.

## 2022-11-07 NOTE — Progress Notes (Signed)
12 Days Post-Op Procedure(s) (LRB): ESOPHAGOGASTRODUODENOSCOPY (EGD) (N/A) BIOPSY  Subjective: Patient reports overall doing ok. Diarrhea started overnight. Tolerating liquid/soft diet without nausea. In process of transitioning from PCA to fentanyl patch for pain.   Objective: Vital signs in last 24 hours: Temp:  [98.1 F (36.7 C)-98.6 F (37 C)] 98.1 F (36.7 C) (02/21 1350) Pulse Rate:  [93-99] 99 (02/21 1350) Resp:  [18-21] 20 (02/21 1350) BP: (117-131)/(77-85) 117/77 (02/21 1350) SpO2:  [19 %-98 %] 96 % (02/21 1350) Weight:  [156 lb 8.4 oz (71 kg)] 156 lb 8.4 oz (71 kg) (02/21 0808) Last BM Date : 11/07/22  Intake/Output from previous day: 02/20 0701 - 02/21 0700 In: 739.9 [I.V.:739.9] Out: 1650 [Urine:1650]  Physical Examination: General: alert, cooperative, in no acute distress Resp: clear to auscultation bilaterally Cardio: regular rate and rhythm GI: Abdomen distended, active bowel sounds, mildly tense but soft on palpation Extremities: extremities normal, atraumatic, no cyanosis or edema  Endocervical and Endometrial biopsy procedure Preoperative diagnosis: Abnormal pap Postoperative diagnosis: Same as above Physician: Berline Lopes MD Estimated blood loss: Minimal Specimens: Endocervical biopsy, endometrial biopsy Procedure: After the procedure was discussed with the patient including risks and benefits, she gave consent.  She was then placed in dorsolithotomy position and a speculum was placed in the vagina.  Once the cervix was well visualized it was cleansed with Betadine x3.  A single-tooth tenaculum was placed on the anterior lip of the cervix.  A Kevorkian curette was then used to perform an endocervical curettage.  A Cytobrush was also used.  An endometrial Pipelle was then passed to a depth of just over 7 cm.  1 pass was performed with sufficient tissue obtained.  This was placed in formalin.  Overall the patient tolerated the procedure well.  All instruments were  removed from the vagina.  Labs: WBC/Hgb/Hct/Plts:  1.7/7.8/25.4/129 (02/21 [id number]) BUN/Cr/glu/ALT/AST/amyl/lip:  22/0.45/--/58/16/--/-- (02/21 [id number])  Pap on 2/7: AGC, NOS. HR HPV negative.  Assessment:  47 y.o. 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 Now s/p cycle #1 of chemotherapy, on TPN, working to transition to home regimen for pain control.  Abnormal pap: Discussed with the patient recent abnormal Pap.  Normal-appearing cervix and high risk HPV is negative.  Given atypical glandular cells, I recommended endocervical biopsy and endometrial biopsy to assure that her metastatic disease findings are not from a metastatic endocervical cancer or endometrial cancer.  Both biopsies today performed without any difficulty.  Will place a rush on the path.     LOS: 18 days    Lafonda Mosses 11/07/2022, 2:54 PM

## 2022-11-07 NOTE — Progress Notes (Signed)
PROGRESS NOTE    Christina [name]  [id number] DOB: May 20, 1976 DOA: 10/19/2022 PCP: [name], MD     Brief Narrative:  Christina [name] is a 47 year old female with history of anxiety, HPV, who presented with complaint of abdominal distention, constipation.  CT abdomen/pelvis showed peritoneal carcinomatosis with extensive metastasis, ascites, multiloculated cystic lesion of the pancreatic tail.  GYN oncology consulted initially .  Underwent paracentesis x 3.  Tumor markers are elevated :CA125, CEA.  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  Oncology, GI following.  S/p  EGD, plan for EUS/biopsy as per GI but has been postponed.  Started  on TPN, PCA pump.  Hospital course complicated by persistent abdominal pain, distention, constipation, poor oral intake.  Palliative care also following for goals of care.  Oncology started chemotherapy.  New events last 24 hours / Subjective: Patient rates her pain at a 0. Has been tolerating small amounts of full liquid diet. Remains on PCA pump and TPN.   Assessment & Plan:   Principal Problem:   Peritoneal carcinomatosis ([place]) Active Problems:   S/P cesarean section   Generalized abdominal pain   Constipation   Ovarian mass   Pancreatic mass   Tobacco abuse   Malignant ascites   Protein-calorie malnutrition (HCC)   Cancer associated pain   High risk medication use   Palliative care encounter   Counseling and coordination of care   Metastatic adenocarcinoma with peritoneal carcinomatosis -Concern of extensive metastatic disease with unknown primary as seen on the CT scan.  CT concerning for bilateral cystic/solid ovarian masses, pancreatic lesion also noted.  CEA, CA125 elevated, CA 19-9 is normal. 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   -GI consulted, status post EGD on 2/9. EGD was negative for  malignancy in the esophagus, stomach or duodenum but was found to have significant extrinsic compression  in the gastric antrum. EUS was initially planned however it was canceled later due to abdominal distention. Now GI does not think EUS will be high yield procedure and its finding may not change patient's management.  EUS can be done later as an outpatient once her symptoms improved. -Underwent Port-A-Cath placement on 2/9. Oncology started chemotherapy. Next cycle due 2/28    Pain management -Palliative on board-recommend Reglan 5 mg 3 times daily, PPI, Decadron 4 mg for inflammation and nausea.  No NG unless she has vomiting. -Remains on PCA pump   Malignant ascites  -Underwent paracentesis on 2/3, 2/6,  2/10, 2/16.   Continue supportive care.   -Continue spironolactone, IV Lasix.      Pulmonary embolism -Asymptomatic.  CT chest done with contrast for staging purpose showed small embolus on the proximal right lower lobe pulmonary artery.  Currently on full dose Lovenox.  Will change Eliquis when appropriate.  Pancytopenia -In setting of chemotherapy   Microcytic anemia/iron deficiency/folic acid deficiency -Continue iron supplement, folic acid supplement. S/p 1 unit PRBC transfusion on 2/16   Moderate protein calorie malnutrition:  Hypoalbuminemia:  -Nutritionist consulted and following -Remains on TPN    Hypertension -Norvasc, losartan  Hypokalemia -Replace    DVT prophylaxis: Lovenox  Code Status: Full Family Communication: None at bedside  Disposition Plan:   Status is: Inpatient Remains inpatient appropriate because: remains on PCA and TPN   Antimicrobials:  Anti-infectives (From admission, onward)    None        Objective: Vitals:   11/07/22  [id number] 11/07/22 0445 11/07/22 0500 11/07/22 0808  BP:   131/84   Pulse:   93   Resp: 19 (!) 21 18   Temp:   98.6 F (37 C)   TempSrc:   Oral   SpO2:  (!) 19% 95%   Weight:    71 kg  Height:         Intake/Output Summary (Last 24 hours) at 11/07/2022 1146 Last data filed at 11/07/2022 0808 Gross per 24 hour  Intake 931.67 ml  Output 1000 ml  Net -68.33 ml   Filed Weights   11/03/22 0500 11/05/22 0500 11/07/22 0808  Weight: 66.1 kg 67.4 kg 71 kg    Examination:  General exam: Appears calm and comfortable  Respiratory system: Clear to auscultation. Respiratory effort normal. No respiratory distress. No conversational dyspnea.  Cardiovascular system: S1 & S2 heard, RRR. No murmurs. No pedal edema. Gastrointestinal system: Abdomen is nondistended, soft and nontender. Normal bowel sounds heard. Central nervous system: Alert and oriented. No focal neurological deficits. Speech clear.  Extremities: Symmetric in appearance  Skin: No rashes, lesions or ulcers on exposed skin  Psychiatry: Judgement and insight appear normal. Mood & affect appropriate.   Data Reviewed: I have personally reviewed following labs and imaging studies  CBC: Recent Labs  Lab 11/03/22 0345 11/04/22 0638 11/05/22 0414 11/06/22 0521 11/07/22 0640  WBC 6.6 4.4 3.5* 2.2* 1.7*  HGB 8.6* 8.7* 8.1* 8.0* 7.8*  HCT 27.4* 29.0* 26.2* 25.7* 25.4*  MCV 76.8* 79.2* 76.6* 76.7* 76.7*  PLT 265 225 171 138* Q000111Q*   Basic Metabolic Panel: Recent Labs  Lab 11/01/22 0512 11/03/22 0345 11/04/22 0638 11/05/22 0414 11/06/22 0521 11/07/22 0640  NA 130* 130* 135 134* 132* 132*  K 4.4 3.9 3.6 3.8 3.7 3.4*  CL 97* 99 109 101 97* 100  CO2 22 23 22 24 25 25  $ GLUCOSE 215* 232* 176* 191* 269* 180*  BUN 20 19 24* 19 20 22*  CREATININE 0.50 0.35* 0.54 0.43* 0.40* 0.45  CALCIUM 8.3* 7.8* 7.6* 8.0* 7.8* 7.7*  MG 1.9  --  2.1 1.9  --   --   PHOS 3.3  --  3.4 2.9  --   --    GFR: Estimated Creatinine Clearance: 73.7 mL/min (by C-G formula based on SCr of 0.45 mg/dL). Liver Function Tests: Recent Labs  Lab 11/01/22 0512 11/04/22 [id number] 11/05/22 0414 11/06/22 0521 11/07/22 0640  AST 19 31 48* 25 16  ALT 17 66*  116* 88* 58*  ALKPHOS 80 113 117 117 102  BILITOT 0.1* 0.2* 0.3 0.4 0.1*  PROT 5.8* 4.9* 4.7* 4.6* 4.3*  ALBUMIN 1.8* 1.7* 1.6* 1.6* 1.7*   No results for input(s): "LIPASE", "AMYLASE" in the last 168 hours. No results for input(s): "AMMONIA" in the last 168 hours. Coagulation Profile: No results for input(s): "INR", "PROTIME" in the last 168 hours. Cardiac Enzymes: No results for input(s): "CKTOTAL", "CKMB", "CKMBINDEX", "TROPONINI" in the last 168 hours. BNP (last 3 results) No results for input(s): "PROBNP" in the last 8760 hours. HbA1C: No results for input(s): "HGBA1C" in the last 72 hours. CBG: Recent Labs  Lab 11/06/22 2018 11/07/22 0038 11/07/22 0503 11/07/22 0810 11/07/22 1144  GLUCAP 174* 150* 146* 141* 180*   Lipid Profile: Recent Labs    11/05/22 0414  TRIG 99   Thyroid Function Tests: No results for input(s): "TSH", "T4TOTAL", "FREET4", "T3FREE", "THYROIDAB" in the last 72 hours. Anemia Panel: No results for input(s): "VITAMINB12", "FOLATE", "FERRITIN", "TIBC", "IRON", "  RETICCTPCT" in the last 72 hours. Sepsis Labs: No results for input(s): "PROCALCITON", "LATICACIDVEN" in the last 168 hours.  No results found for this or any previous visit (from the past 240 hour(s)).    Radiology Studies: No results found.    Scheduled Meds:  (feeding supplement) PROSource Plus  30 mL Oral BID BM   amLODipine  10 mg Oral Daily   Chlorhexidine Gluconate Cloth  6 each Topical Daily   clonazepam  0.25 mg Oral BID   dexamethasone (DECADRON) injection  4 mg Intravenous Daily   enoxaparin (LOVENOX) injection  1 mg/kg Subcutaneous Q12H   feeding supplement  1 Container Oral TID BM   fentaNYL  1 patch Transdermal Q72H   ferrous sulfate  325 mg Oral Q breakfast   furosemide  40 mg Intravenous Daily   HYDROmorphone   Intravenous Q4H   insulin aspart  0-15 Units Subcutaneous Q4H   losartan  50 mg Oral Daily   metoCLOPramide (REGLAN) injection  5 mg Intravenous Q8H    pantoprazole (PROTONIX) IV  40 mg Intravenous Q24H   polyethylene glycol  17 g Oral Q8H   pregabalin  25 mg Oral QHS   senna-docusate  2 tablet Oral QHS   sodium chloride flush  10-40 mL Intracatheter Q12H   spironolactone  25 mg Oral Daily   Continuous Infusions:  TPN ADULT (ION) 65 mL/hr at 11/07/22 0808     LOS: 18 days   Time spent: 30 minutes   Dessa Phi, DO [facility] 11/07/2022, 11:46 AM   Available via Epic secure chat 7am-7pm After these hours, please refer to coverage provider listed on [email or web address]

## 2022-11-07 NOTE — Progress Notes (Signed)
Daily Progress Note   Patient Name: Christina [name]       Date: 11/07/2022 DOB: 06-25-1976  Age: 47 y.o. MRN#: [id number] Attending Physician: Dessa Phi, DO Primary Care Physician: [name], MD Admit Date: 10/19/2022 Length of Stay: 18 days  Reason for Consultation/Follow-up: Establishing goals of care and Symptom Management  Subjective:   CC: Patient noting feeling tired today due to multiple awakenings overnight. Following up regarding symptom management.   Subjective:  PCA needs noted.    fentanyl patch 50 mcg change Q 72 hours.  Also on  Lyrica 25 mg daily.Patient also receiving Klonopin 0.879m BID, dexamethasone 446mdaily, and Reglan q8hr scheduled.   When seeing patient this morning, she is laying  in bed, resting ok, pain reasonably well controlled, having BMs.     Review of Systems Feeling tired today Objective:   Vital Signs:  BP 131/84 (BP Location: Left Arm)   Pulse 93   Temp 98.6 F (37 C) (Oral)   Resp 18   Ht 4' 11"$  (1.499 m)   Wt 71 kg   LMP 10/12/2022   SpO2 95%   BMI 31.61 kg/m   Physical Exam: General: NAD, alert, laying in bed Eyes: no drainage noted HENT: dry mucous membranes Cardiovascular: RRR Respiratory: no increased work of breathing noted, not in respiratory distress Abdomen: distended Extremities: moving Ues appropriately  Skin: no rashes or lesions on visible skin Neuro: A&Ox4, following commands easily Psych: appropriately answers all questions  Imaging:  I personally reviewed recent imaging.   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  Palliative medicine team consulted to assist with symptom management.   Recommendations/Plan: # Complex medical decision making/goals of care:  -Continuing with full scope of care at this time.[facility] hopefully continue supporting goals for medical care moving forward.                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 Attempt to transition to more sustainable outpatient pain regimen.    - Fentanyl patch 50 mcg at noon today                -   PO hydromorphone from today 11-07-22.    -Continue dexamethasone 79m45maily at this time. Consider transition to po in next 1-2 days then taper if appropraite.   -NO NG tube unless has vomiting    Anxiety, in setting of recent cancer diagnosis  -Continue Klonopin 0.14m71mD                 -Constipation, acute on chronic in setting of malignant ascites    # Discharge Planning: TBD  -[facility] AtheJobe Gibbonat time of discharge  to inform of this as will need close follow up.   Discussed with: patient   Thank you for allowing the palliative care team to participate in the care Christina [name].  Mod MDM Loistine Chance MD Palliative Care Provider PMT # [phone]  If patient remains symptomatic despite maximum doses, please call PMT at [phone] between 0700 and 1900. Outside of these hours, please call attending, as PMT does not have night coverage.

## 2022-11-07 NOTE — Progress Notes (Signed)
PHARMACY - TOTAL PARENTERAL NUTRITION CONSULT NOTE   Indication:  gastric outlet obstruction with extrinsic compression   Patient Measurements: Height: 4' 11"$  (149.9 cm) Weight: 67.4 kg (148 lb 9.4 oz) IBW/kg (Calculated) : 43.2 TPN AdjBW (KG): 49.8 Body mass index is 30.01 kg/m. 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 Malignant ascites s/p paracentesis x 3. EGD: gastric outlet obstruction w/ extrinsic compression. Pharmacy consulted to manage TPN for nutrition support.   Glucose / Insulin: no hx DM. CBGs: [id number] (goal < 180), only 1 outlier.  - SSI / 24 hrs: 17 units - Insulin in TPN: 20 units - Dexamethasone 56m IV x1 on 2/14, then (start 2/16) 466mIV daily Electrolytes:  Na remains low at 132. K slightly low at 3.4. Others, including Corrected Calcium, WNL. Renal: SCr 0.45 Hepatic: AST WNL. ALT elevated, but trending down. Alk Phos WNL. Tbili low.  Triglycerides: WNL (2/19) Intake / Output; MIVF: I/O incomplete, unmeasured UOP - No IVF. Lasix 4040mV daily. Spironolactone 17m21m daily.   - No PO intake recorded - LBM 2/21  GI Imaging:  2/11 CT abd/pelvis: Multiple dilated loops of small bowel have developed which may represent ileus versus partial or developing small bowel obstruction. Numerous thickened, hyperenhancing loops of distal small bowel, suggestive of infectious or inflammatory enteritis. Abnormal wall thickening of the gastric antrum, which could be secondary to gastritis or malignancy. Large bilateral multi-cystic adnexal masses concerning for primary or metastatic ovarian malignancy. Multiloculated cystic lesion of the pancreatic tail. 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 2/12 abdominal X-ray: Persistent moderate gas distension of large and small bowel suggestive of ileus vs. partial obstruction  GI Surgeries /  Procedures:  2/9 EGD: gastric outlet obstruction with extrinsic compression. 2/16 IR: paracentesis 3.5L out  Central access:  - 2/9: single lumen PAC for chemo - 2/12: PICC for TPN  TPN start date: 2/12 - 2/11: Christina Pontesrmacy unable to compound TPN. Will start 2/12, Dr AdhiTawanna SoloN informed.   Nutritional Goals: Goal TPN rate is 65 mL/hr (provides 86 g of protein and 1607 kcals per day)  RD Assessment:  Estimated Needs Total Energy Estimated Needs: 1500-1700 Total Protein Estimated Needs: 75-90g Total Fluid Estimated Needs: 1.5L/day  Current Nutrition:  TPN Diet: full liquids (2/19) 2/7: Boost/Resource Breeze: not taking 2/6: Prosource: not taking  Plan:  Now: KCl 40mE22m x 1  At 1800: Continue TPN at goal rate of 65 mL/hr.  Electrolytes in TPN:  Na 154 mEq/L (maxed) K 35 mEq/L Ca 5mEq/30mg 2 mEq/L Phos 10 mmol/L Cl:Ac ratio  2:1 Continue Insulin 20 units in bag Add standard MVI and trace elements to TPN Remove folic acid from TPN and change to PO Continue Moderate q4h SSI and adjust as needed  Monitor TPN labs on Mon/Thurs and PRN   Christina [name], BCPS Clinical Pharmacist 11/07/2022 7:58 AM

## 2022-11-08 DIAGNOSIS — C786 Secondary malignant neoplasm of retroperitoneum and peritoneum: Secondary | ICD-10-CM | POA: Diagnosis not present

## 2022-11-08 LAB — COMPREHENSIVE METABOLIC PANEL
ALT: 44 U/L (ref 0–44)
AST: 14 U/L — ABNORMAL LOW (ref 15–41)
Albumin: 1.7 g/dL — ABNORMAL LOW (ref 3.5–5.0)
Alkaline Phosphatase: 99 U/L (ref 38–126)
Anion gap: 7 (ref 5–15)
BUN: 24 mg/dL — ABNORMAL HIGH (ref 6–20)
CO2: 25 mmol/L (ref 22–32)
Calcium: 7.9 mg/dL — ABNORMAL LOW (ref 8.9–10.3)
Chloride: 100 mmol/L (ref 98–111)
Creatinine, Ser: 0.52 mg/dL (ref 0.44–1.00)
GFR, Estimated: >60 mL/min (ref >60)
Glucose, Bld: 166 mg/dL — ABNORMAL HIGH (ref 70–99)
Potassium: 3.7 mmol/L (ref 3.5–5.1)
Sodium: 132 mmol/L — ABNORMAL LOW (ref 135–145)
Total Bilirubin: 0.2 mg/dL — ABNORMAL LOW (ref 0.3–1.2)
Total Protein: 4.8 g/dL — ABNORMAL LOW (ref 6.5–8.1)

## 2022-11-08 LAB — TROPONIN I (HIGH SENSITIVITY)
Troponin I (High Sensitivity): 4 ng/L (ref <18)
Troponin I (High Sensitivity): 4 ng/L (ref <18)

## 2022-11-08 LAB — CBC
HCT: 26.7 % — ABNORMAL LOW (ref 36.0–46.0)
Hemoglobin: 8.2 g/dL — ABNORMAL LOW (ref 12.0–15.0)
MCH: 23.6 pg — ABNORMAL LOW (ref 26.0–34.0)
MCHC: 30.7 g/dL (ref 30.0–36.0)
MCV: 76.9 fL — ABNORMAL LOW (ref 80.0–100.0)
Platelets: 165 10*3/uL (ref 150–400)
RBC: 3.47 MIL/uL — ABNORMAL LOW (ref 3.87–5.11)
RDW: 18.9 % — ABNORMAL HIGH (ref 11.5–15.5)
WBC: 1 10*3/uL — CL (ref 4.0–10.5)
nRBC: 0 % (ref 0.0–0.2)

## 2022-11-08 LAB — MAGNESIUM: Magnesium: 1.8 mg/dL (ref 1.7–2.4)

## 2022-11-08 LAB — PHOSPHORUS: Phosphorus: 3.4 mg/dL (ref 2.5–4.6)

## 2022-11-08 LAB — GLUCOSE, CAPILLARY
Glucose-Capillary: 125 mg/dL — ABNORMAL HIGH (ref 70–99)
Glucose-Capillary: 128 mg/dL — ABNORMAL HIGH (ref 70–99)
Glucose-Capillary: 131 mg/dL — ABNORMAL HIGH (ref 70–99)
Glucose-Capillary: 157 mg/dL — ABNORMAL HIGH (ref 70–99)
Glucose-Capillary: 164 mg/dL — ABNORMAL HIGH (ref 70–99)
Glucose-Capillary: 182 mg/dL — ABNORMAL HIGH (ref 70–99)

## 2022-11-08 LAB — SURGICAL PATHOLOGY

## 2022-11-08 LAB — TRIGLYCERIDES: Triglycerides: 104 mg/dL (ref <150)

## 2022-11-08 MED ORDER — TRAVASOL 10 % IV SOLN
INTRAVENOUS | Status: AC
  Filled 2022-11-08: qty 858

## 2022-11-08 MED ORDER — TBO-FILGRASTIM 300 MCG/0.5ML ~~LOC~~ SOSY
300.0000 ug | PREFILLED_SYRINGE | Freq: Every day | SUBCUTANEOUS | Status: DC
  Administered 2022-11-08 – 2022-11-11 (×4): 300 ug via SUBCUTANEOUS
  Filled 2022-11-08 (×5): qty 0.5

## 2022-11-08 NOTE — Progress Notes (Signed)
PHARMACY - TOTAL PARENTERAL NUTRITION CONSULT NOTE   Indication:  gastric outlet obstruction with extrinsic compression   Patient Measurements: Height: 4' 11"$  (149.9 cm) Weight: 71 kg (156 lb 8.4 oz) IBW/kg (Calculated) : 43.2 TPN AdjBW (KG): 49.8 Body mass index is 31.61 kg/m. 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 Malignant ascites s/p paracentesis x 3. EGD: gastric outlet obstruction w/ extrinsic compression. Pharmacy consulted to manage TPN for nutrition support.   Glucose / Insulin: no hx DM. CBGs: 125-206 (goal < 180), only 1 outlier, 206 at 1633 2/21  - SSI / 24 hrs: 19 units - Insulin in TPN: 20 units - Dexamethasone 33m IV x1 on 2/14, then (start 2/16) 429mIV daily Electrolytes:  Na remains low at 132. K improved 3.7. Others, including Corrected Calcium, WNL. Renal: SCr 0.52 stable Hepatic: AST/ALT low/WNL now. Alk Phos WNL. Tbili low.  Triglycerides: 104 (2/21) Intake / Output; MIVF: I/O incomplete, unmeasured UOP - No IVF. Lasix 4038mV daily. Spironolactone 52m57m daily.   - No PO intake recorded - LBM 2/21  GI Imaging:  2/11 CT abd/pelvis: Multiple dilated loops of small bowel have developed which may represent ileus versus partial or developing small bowel obstruction. Numerous thickened, hyperenhancing loops of distal small bowel, suggestive of infectious or inflammatory enteritis. Abnormal wall thickening of the gastric antrum, which could be secondary to gastritis or malignancy. Large bilateral multi-cystic adnexal masses concerning for primary or metastatic ovarian malignancy. Multiloculated cystic lesion of the pancreatic tail. 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 2/12 abdominal X-ray: Persistent moderate gas distension of large and small bowel suggestive of ileus vs. partial obstruction  GI Surgeries /  Procedures:  2/9 EGD: gastric outlet obstruction with extrinsic compression. 2/16 IR: paracentesis 3.5L out  Central access:  - 2/9: single lumen PAC for chemo - 2/12: PICC for TPN  TPN start date: 2/12 - 2/11: [name] unable to compound TPN. Will start 2/12, Dr AdhiTawanna SoloN informed.   Nutritional Goals: Goal TPN rate is 65 mL/hr (provides 86 g of protein and 1607 kcals per day)  RD Assessment:  Estimated Needs Total Energy Estimated Needs: 1500-1700 Total Protein Estimated Needs: 75-90g Total Fluid Estimated Needs: 1.5L/day  Current Nutrition:  TPN Diet: full liquids (2/19) 2/7: Boost/Resource Breeze: not taking 2/6: Prosource: not taking  Plan:  At 1800: Continue TPN at goal rate of 65 mL/hr.  Electrolytes in TPN:  Na 154 mEq/L (maxed) K 40 mEq/L Ca 5mEq43mMg 2 mEq/L Phos 10 mmol/L Cl:Ac ratio  2:1 Continue Insulin 20 units in bag Add standard MVI and trace elements to TPN Remove folic acid from TPN and change to PO Continue Moderate q4h SSI and adjust as needed  Monitor TPN labs on Mon/Thurs and PRN   Christina [name], BCPS Clinical Pharmacist 11/08/2022 9:17 AM

## 2022-11-08 NOTE — Progress Notes (Signed)
Nutrition Follow-up  INTERVENTION:   -TPN management per Pharmacy  -Encourage PO as tolerated  -D/c Boost Breeze and Prosource -pt refusing   NUTRITION DIAGNOSIS:   Increased nutrient needs related to cancer and cancer related treatments as evidenced by estimated needs.  Ongoing.  GOAL:   Patient will meet greater than or equal to 90% of their needs  Meeting with TPN  MONITOR:   PO intake, Supplement acceptance, I & O's, Labs, Weight trends, TPN   ASSESSMENT:   47 y.o. female with medical history significant of anxiety, HPV comes to the hospital at [place] ED with complaints of abdominal distention and constipation. CT abdomen pelvis showed peritoneal carcinomatosis with extensive metastases, ascites and multiloculated cystic lesion of the pancreatic tail.  GYN oncology consulted, underwent paracentesis, 3.5 L was removed.  2/2: admitted 2/3: s/p paracentesis, yield 3.5L 2/6: s/p paracentesis, yield 3.1L 2/9: s/p EGD, s/p PAC placement 2/16: s/p paracentesis, yield 3.5L   Patient on full liquids. Having pain in shoulders, chest and abdomen. Vomited. Per MD note, pt to have paracentesis today.   TPN continues at 65 ml/hr, providing 1606 kcals and 85g protein.  Admission weight: 153 lbs Current weight: 154 lbs  Medications: Folic acid, Lasix, Miralax, Senokot  Labs reviewed: CBGs: 125-182 Low Na   Diet Order:   Diet Order             Diet full liquid Room service appropriate? Yes; Fluid consistency: Thin  Diet effective now                   EDUCATION NEEDS:   No education needs have been identified at this time  Skin:  Skin Assessment: Reviewed RN Assessment  Last BM:  2/22 -type 7  Height:   Ht Readings from Last 1 Encounters:  10/26/22 4' 11"$  (1.499 m)    Weight:   Wt Readings from Last 1 Encounters:  11/08/22 70.3 kg    BMI:  Body mass index is 31.3 kg/m.  Estimated Nutritional Needs:   Kcal:  1500-1700  Protein:   75-90g  Fluid:  1.5L/day  Clayton Bibles, MS, RD, LDN Inpatient Clinical Dietitian Contact information available via Amion

## 2022-11-08 NOTE — Progress Notes (Addendum)
ARDINE DULAY   DOB:07-01-1976   [id number]   [id number]  Med/onc follow up note  Subjective: Patient noticed her abdominal distention is worse today, she had intermittent nausea, no vomiting.  Still very fatigued, no fever or chills.    Vitals:   11/08/22 1410 11/08/22 2032  BP: 123/84 128/86  Pulse: (!) 102 92  Resp: 20 17  Temp: 98.3 F (36.8 C) 98.2 F (36.8 C)  SpO2: 99% 97%    Body mass index is 31.3 kg/m.  Intake/Output Summary (Last 24 hours) at 11/08/2022 2312 Last data filed at 11/08/2022 1658 Gross per 24 hour  Intake 724.64 ml  Output 350 ml  Net 374.64 ml     Sclerae unicteric  Oropharynx clear  No peripheral adenopathy  Lungs clear -- no rales or rhonchi  Heart regular rate and rhythm  Abdomen distended with mild diffuse tenderness.  MSK no focal spinal tenderness, no peripheral edema  Neuro nonfocal    CBG (last 3)  Recent Labs    11/08/22 1211 11/08/22 1651 11/08/22 2027  GLUCAP 182* 164* 157*     Labs:   Urine Studies No results for input(s): "UHGB", "CRYS" in the last 72 hours.  Invalid input(s): "UACOL", "UAPR", "USPG", "UPH", "UTP", "UGL", "UKET", "UBIL", "UNIT", "UROB", "ULEU", "UEPI", "UWBC", "URBC", "UBAC", "CAST", "UCOM", "BILUA"  Basic Metabolic Panel: Recent Labs  Lab 11/04/22 0638 11/05/22 0414 11/06/22 0521 11/07/22 0640 11/08/22 0550  NA 135 134* 132* 132* 132*  K 3.6 3.8 3.7 3.4* 3.7  CL 109 101 97* 100 100  CO2 '22 24 25 25 25  '$ GLUCOSE 176* 191* 269* 180* 166*  BUN 24* 19 20 22* 24*  CREATININE 0.54 0.43* 0.40* 0.45 0.52  CALCIUM 7.6* 8.0* 7.8* 7.7* 7.9*  MG 2.1 1.9  --   --  1.8  PHOS 3.4 2.9  --   --  3.4   GFR Estimated Creatinine Clearance: 73.3 mL/min (by C-G formula based on SCr of 0.52 mg/dL). Liver Function Tests: Recent Labs  Lab 11/04/22 [id number] 11/05/22 0414 11/06/22 0521 11/07/22 0640 11/08/22 0550  AST 31 48* 25 16 14*  ALT 66* 116* 88* 58* 44  ALKPHOS 113 117 117 102 99  BILITOT 0.2*  0.3 0.4 0.1* 0.2*  PROT 4.9* 4.7* 4.6* 4.3* 4.8*  ALBUMIN 1.7* 1.6* 1.6* 1.7* 1.7*   No results for input(s): "LIPASE", "AMYLASE" in the last 168 hours. No results for input(s): "AMMONIA" in the last 168 hours. Coagulation profile No results for input(s): "INR", "PROTIME" in the last 168 hours.   CBC: Recent Labs  Lab 11/04/22 0638 11/05/22 0414 11/06/22 0521 11/07/22 0640 11/08/22 0550  WBC 4.4 3.5* 2.2* 1.7* 1.0*  HGB 8.7* 8.1* 8.0* 7.8* 8.2*  HCT 29.0* 26.2* 25.7* 25.4* 26.7*  MCV 79.2* 76.6* 76.7* 76.7* 76.9*  PLT 225 171 138* 129* 165   Cardiac Enzymes: No results for input(s): "CKTOTAL", "CKMB", "CKMBINDEX", "TROPONINI" in the last 168 hours. BNP: Invalid input(s): "POCBNP" CBG: Recent Labs  Lab 11/08/22 0334 11/08/22 0732 11/08/22 1211 11/08/22 1651 11/08/22 2027  GLUCAP 125* 131* 182* 164* 157*   D-Dimer No results for input(s): "DDIMER" in the last 72 hours. Hgb A1c No results for input(s): "HGBA1C" in the last 72 hours. Lipid Profile Recent Labs    11/08/22 0550  TRIG 104   Thyroid function studies No results for input(s): "TSH", "T4TOTAL", "T3FREE", "THYROIDAB" in the last 72 hours.  Invalid input(s): "FREET3" Anemia work up No results for input(s): "VITAMINB12", "FOLATE", "  FERRITIN", "TIBC", "IRON", "RETICCTPCT" in the last 72 hours. Microbiology No results found for this or any previous visit (from the past 240 hour(s)).     Studies:  No results found.  Assessment: 47 y.o. 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 Malignant ascites status post paracentesisX3 Recurrent N/V and constipation, secondary to #1, partial bowel obstruction due to the extensive peritoneal metastasis Moderate protein and calorie malnutrition Small PE    Plan:  -I reviewed her endometrial biopsy which showed metastatic adenocarcinoma, IHC result is  essentially the same as cytology from paracentesis.  I discussed with Dr. Berline Lopes, and pathologist Dr. Vic Ripper, both feel this is likely GI primary with metastasis to peritoneum and uterus, although metastasis to endometrium is unusual.  We decided to send her endometrial biopsy sample to FO and Cancertype ID molecular testing.  -She has developed significant neutropenia from chemotherapy.  I will start her on Granix today, will continue daily until [place] above 1.5K. -Continue pain management and other supportive care, repeated paracentesis as needed.   Truitt Merle, MD 11/08/2022

## 2022-11-08 NOTE — Progress Notes (Signed)
       CROSS COVER NOTE  NAME: Christina [name] MRN: [id number] DOB : 05/30/1976    Date of Service   11/08/2022     HPI/Events of Note   Patient experiencing pressure-like pain between her shoulder blades to the front of her mid chest.    Chest discomfort triggered when taking deep breaths.  Patient does endorse abdominal distention and tenderness.  Patient has required x 3 paracentesis for malignant ascites.  Last paracentesis was on 2/16 with 3.5 L out.  Patient has had multiple bowel movements as well.   EKG ordered.  NSR without ST segment changes.   Interventions/ Plan   Already receiving pain med, per [place], DNP, [place]

## 2022-11-08 NOTE — Progress Notes (Signed)
Daily Progress Note   Patient Name: Christina [name]       Date: 11/08/2022 DOB: [birth year]  Age: 47 y.o. MRN#: [id number] Attending Physician: Dessa Phi, DO Primary Care Physician: [name], MD Admit Date: 10/19/2022 Length of Stay: 19 days  Reason for Consultation/Follow-up: Establishing goals of care and Symptom Management  Subjective:   CC: Patient did not rest well overnight, complains of abdominal pressure, believes she might require paracentesis. Now off PCA.     Subjective:  Now off PCA, started on PO Dilaudid PRN. Has rescue IV Dilaudid PRN while she is in hospital.    fentanyl patch 50 mcg change Q 72 hours.  Also on  Lyrica 25 mg daily.Patient also receiving Klonopin 0.59m BID, dexamethasone 435mdaily, and Reglan q8hr scheduled.   When seeing patient this morning, she is laying  in bed, complains of abdominal pressure.     Review of Systems Feeling tired today Objective:   Vital Signs:  BP 131/85 (BP Location: Left Arm)   Pulse (!) 102   Temp 98.2 F (36.8 C)   Resp 18   Ht 4' 11"$  (1.499 m)   Wt 70.3 kg   LMP 10/12/2022   SpO2 100%   BMI 31.30 kg/m   Physical Exam: General: NAD, alert, laying in bed Eyes: no drainage noted HENT: dry mucous membranes Cardiovascular: RRR Respiratory: no increased work of breathing noted, not in respiratory distress Abdomen: distended Extremities: moving Ues appropriately  Skin: no rashes or lesions on visible skin Neuro: A&Ox4, following commands easily Psych: appropriately answers all questions  Imaging:  I personally reviewed recent imaging.   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  Palliative medicine team consulted to assist with symptom management.   Recommendations/Plan: # Complex medical decision making/goals of care:  -Continuing with full scope of care at this time.Patient will need [facility] hopefully continue supporting goals for medical care moving forward.                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 Attempt to transition to more sustainable outpatient pain regimen.    - Fentanyl patch 50 mcg at noon today                -   PO hydromorphone PRN from   11-07-22.    -Continue dexamethasone 58m20maily at this time. Consider transition to po in next 1-2 days then taper if appropriate, ok to continue IV for now.    -NO NG tube unless has vomiting    Anxiety, in setting of recent cancer diagnosis  -Continue Klonopin 0.25m23mD                 -Constipation, acute on chronic in setting of malignant ascites    # Discharge  Planning: TBD  -Will need follow up with PMT at [facility].   Please reach out to provider Jobe Gibbon NP at time of discharge to inform of this as will need close follow up.   Discussed with: patient   Thank you for allowing the palliative care team to participate in the care [name].  Mod MDM Loistine Chance MD Palliative Care Provider PMT # [phone]  If patient remains symptomatic despite maximum doses, please call PMT at [phone] between 0700 and 1900. Outside of these hours, please call attending, as PMT does not have night coverage.

## 2022-11-08 NOTE — Progress Notes (Signed)
PROGRESS NOTE    Christina [name]  [id number] DOB: [birth year] DOA: 10/19/2022 PCP: [name], MD     Brief Narrative:  Christina [name] is a 47 year old female with history of anxiety, HPV, who presented with complaint of abdominal distention, constipation.  CT abdomen/pelvis showed peritoneal carcinomatosis with extensive metastasis, ascites, multiloculated cystic lesion of the pancreatic tail.  GYN oncology consulted initially .  Underwent paracentesis x 3.  Tumor markers are elevated :CA125, CEA.  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  Oncology, GI following.  S/p  EGD, plan for EUS/biopsy as per GI but has been postponed.  Started  on TPN, PCA pump.  Hospital course complicated by persistent abdominal pain, distention, constipation, poor oral intake.  Palliative care also following for goals of care.  Oncology started chemotherapy.  New events last 24 hours / Subjective: Early this morning, patient complained of pain between her shoulder blades, associated with chest tightness.  Followed by 2 episodes of vomiting.  She received Zofran and stated that her symptoms improved.  EKG was done at that time which revealed normal sinus rhythm.  This morning, patient still having some chest tightness and pressure, although less severe.  Starting to feel abdominal distention as well.  Assessment & Plan:   Principal Problem:   Peritoneal carcinomatosis ([place]) Active Problems:   S/P cesarean section   Generalized abdominal pain   Constipation   Ovarian mass   Pancreatic mass   Tobacco abuse   Malignant ascites   Protein-calorie malnutrition ([facility])   Cancer associated pain   High risk medication use   Palliative care encounter   Counseling and coordination of care   Abnormal cervical Papanicolaou smear   Metastatic adenocarcinoma with peritoneal carcinomatosis -Concern of extensive metastatic disease with unknown  primary as seen on the CT scan.  CT concerning for bilateral cystic/solid ovarian masses, pancreatic lesion also noted.  CEA, CA125 elevated, CA 19-9 is normal. 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   -GI consulted, status post EGD on 2/9. EGD was negative for malignancy in the esophagus, stomach or duodenum but was found to have significant extrinsic compression  in the gastric antrum. EUS was initially planned however it was canceled later due to abdominal distention. Now GI does not think EUS will be high yield procedure and its finding may not change patient's management.  EUS can be done later as an outpatient once her symptoms improved. -Underwent Port-A-Cath placement on 2/9. Oncology started chemotherapy. Next cycle due 2/28    Pain management -Palliative on board-recommend Reglan 5 mg 3 times daily, PPI, Decadron 4 mg for inflammation and nausea.  No NG unless she has vomiting. -Remains on PCA pump   Malignant ascites  -Underwent paracentesis on 2/3, 2/6,  2/10, 2/16.   Continue supportive care.   -Continue spironolactone, IV Lasix.   -Repeat paracentesis ordered today  Chest pain -EKG reviewed independently which revealed normal sinus rhythm -Check troponin    Pulmonary embolism -Asymptomatic.  CT chest done with contrast for staging purpose showed small embolus on the proximal right lower lobe pulmonary artery.  Currently on full dose Lovenox.  Will change Eliquis when appropriate.  Pancytopenia -In setting of chemotherapy   Microcytic anemia/iron deficiency/folic acid deficiency -Continue iron supplement, folic acid supplement. S/p 1 unit PRBC transfusion on 2/16   Moderate protein calorie malnutrition:  Hypoalbuminemia:  -Nutritionist consulted and following -Remains on  TPN    Hypertension -Norvasc, losartan   DVT prophylaxis: Lovenox  Code Status: Full Family Communication: None at bedside   Disposition Plan:   Status is: Inpatient Remains inpatient appropriate because: remains on PCA and TPN   Antimicrobials:  Anti-infectives (From admission, onward)    None        Objective: Vitals:   11/07/22 1350 11/07/22 2028 11/08/22 0319 11/08/22 0940  BP: 117/77 125/81 138/66 131/85  Pulse: 99 90 (!) 105 (!) 102  Resp: 20 18 18 18  $ Temp: 98.1 F (36.7 C) 98.8 F (37.1 C) 98.2 F (36.8 C)   TempSrc: Oral Oral    SpO2: 96% 98% 100%   Weight:    70.3 kg  Height:        Intake/Output Summary (Last 24 hours) at 11/08/2022 1047 Last data filed at 11/08/2022 0708 Gross per 24 hour  Intake 1036.02 ml  Output 950 ml  Net 86.02 ml    Filed Weights   11/05/22 0500 11/07/22 0808 11/08/22 0940  Weight: 67.4 kg 71 kg 70.3 kg    Examination:  General exam: Appears calm and comfortable  Respiratory system: Clear to auscultation. Respiratory effort normal. No respiratory distress. No conversational dyspnea.  Cardiovascular system: S1 & S2 heard, RRR. No murmurs. No pedal edema. Gastrointestinal system: Abdomen is mildly distended but soft Central nervous system: Alert and oriented. No focal neurological deficits. Speech clear.  Extremities: Symmetric in appearance  Skin: No rashes, lesions or ulcers on exposed skin  Psychiatry: Judgement and insight appear normal. Mood & affect appropriate.   Data Reviewed: I have personally reviewed following labs and imaging studies  CBC: Recent Labs  Lab 11/04/22 0638 11/05/22 0414 11/06/22 0521 11/07/22 0640 11/08/22 0550  WBC 4.4 3.5* 2.2* 1.7* 1.0*  HGB 8.7* 8.1* 8.0* 7.8* 8.2*  HCT 29.0* 26.2* 25.7* 25.4* 26.7*  MCV 79.2* 76.6* 76.7* 76.7* 76.9*  PLT 225 171 138* 129* [id number]    Basic Metabolic Panel: Recent Labs  Lab 11/04/22 0638 11/05/22 0414 11/06/22 0521 11/07/22 0640 11/08/22 0550  NA 135 134* 132* 132* 132*  K 3.6 3.8 3.7 3.4* 3.7  CL 109 101 97* 100 100  CO2 22 24 25 25 25  $ GLUCOSE 176* 191* 269* 180*  166*  BUN 24* 19 20 22* 24*  CREATININE 0.54 0.43* 0.40* 0.45 0.52  CALCIUM 7.6* 8.0* 7.8* 7.7* 7.9*  MG 2.1 1.9  --   --  1.8  PHOS 3.4 2.9  --   --  3.4    GFR: Estimated Creatinine Clearance: 73.3 mL/min (by C-G formula based on SCr of 0.52 mg/dL). Liver Function Tests: Recent Labs  Lab 11/04/22 [id number] 11/05/22 0414 11/06/22 0521 11/07/22 0640 11/08/22 0550  AST 31 48* 25 16 14*  ALT 66* 116* 88* 58* 44  ALKPHOS 113 117 117 102 99  BILITOT 0.2* 0.3 0.4 0.1* 0.2*  PROT 4.9* 4.7* 4.6* 4.3* 4.8*  ALBUMIN 1.7* 1.6* 1.6* 1.7* 1.7*    No results for input(s): "LIPASE", "AMYLASE" in the last 168 hours. No results for input(s): "AMMONIA" in the last 168 hours. Coagulation Profile: No results for input(s): "INR", "PROTIME" in the last 168 hours. Cardiac Enzymes: No results for input(s): "CKTOTAL", "CKMB", "CKMBINDEX", "TROPONINI" in the last 168 hours. BNP (last 3 results) No results for input(s): "PROBNP" in the last 8760 hours. HbA1C: No results for input(s): "HGBA1C" in the last 72 hours. CBG: Recent Labs  Lab 11/07/22 1633 11/07/22 2030 11/08/22 0004 11/08/22 0334 11/08/22  [place]    Lipid Profile: Recent Labs    11/08/22 0550  TRIG 104    Thyroid Function Tests: No results for input(s): "TSH", "T4TOTAL", "FREET4", "T3FREE", "THYROIDAB" in the last 72 hours. Anemia Panel: No results for input(s): "VITAMINB12", "FOLATE", "FERRITIN", "TIBC", "IRON", "RETICCTPCT" in the last 72 hours. Sepsis Labs: No results for input(s): "PROCALCITON", "LATICACIDVEN" in the last 168 hours.  No results found for this or any previous visit (from the past 240 hour(s)).    Radiology Studies: No results found.    Scheduled Meds:  (feeding supplement) PROSource Plus  30 mL Oral BID BM   amLODipine  10 mg Oral Daily   Chlorhexidine Gluconate Cloth  6 each Topical Daily   clonazepam  0.25 mg Oral BID   dexamethasone (DECADRON) injection  4 mg  Intravenous Daily   enoxaparin (LOVENOX) injection  1 mg/kg Subcutaneous Q12H   feeding supplement  1 Container Oral TID BM   fentaNYL  1 patch Transdermal Q72H   ferrous sulfate  325 mg Oral Q breakfast   folic acid  1 mg Oral Daily   furosemide  40 mg Intravenous Daily   insulin aspart  0-15 Units Subcutaneous Q4H   losartan  50 mg Oral Daily   pantoprazole (PROTONIX) IV  40 mg Intravenous Q24H   polyethylene glycol  17 g Oral Q8H   pregabalin  25 mg Oral QHS   senna-docusate  2 tablet Oral QHS   sodium chloride flush  10-40 mL Intracatheter Q12H   spironolactone  25 mg Oral Daily   Continuous Infusions:  TPN ADULT (ION) 65 mL/hr at 11/08/22 0304   TPN ADULT (ION)       LOS: 19 days   Time spent: 30 minutes   Dessa Phi, DO [facility] 11/08/2022, 10:47 AM   Available via Epic secure chat 7am-7pm After these hours, please refer to coverage provider listed on [email or web address]

## 2022-11-08 NOTE — Progress Notes (Signed)
Pt called out c/o chest pressure and difficulty taking a deep breath. Vitals stable and O2 at 100%. Nasal cannula placed for comfort. Provider notified. EKG obtained - normal EKG. See chart. Pt then vomited x2 - yellow/green bile. Pt states her abdomen feels fuller and she thinks "the fluid is getting worse". Provider at bedside. PRN pain medication given and nausea medication given with good effect. Pt continues on TPN infusion. Pt educated on the probable cause of her chest/abdominal pressure and stated she understood. Pt resting with call bell in reach and bed in locked and lowest position. Pt resting with no other complaints/concerns. Plan of care continues.

## 2022-11-08 NOTE — Evaluation (Signed)
Occupational Therapy Evaluation Patient Details Name: Christina [name] MRN: [id number] DOB: [birth year] Today's Date: 11/08/2022   History of Present Illness Christina [name] is a 47 y.o. female admitted with peritoneal carcinomatosis. CT abdomen/pelvis showed peritoneal carcinomatosis with extensive metastasis, ascites, multiloculated cystic lesion of the pancreatic tail. 2/16 Ultrasound-guided  therapeutic paracentesis performed yielding 3.5 liters. PMH: anxiety, HPV   Clinical Impression   PTA pt lives with her 3 children (7,8 & 20) and has several room mates, who all work during the day. Pt states she is able to complete her self care with increased time, however fatigues very easily. Acute OT to follow to maximize functional level of independence with ADL and functional mobility to facilitate a safe DC home. After ambulating in hallway, pt became very nauseated and vomited significant amount - nsg aware. Pt's sons face timed her at end of session and pt was very emotional after phone call - spiritual care consulted.      Recommendations for follow up therapy are one component of a multi-disciplinary discharge planning process, led by the attending physician.  Recommendations may be updated based on patient status, additional functional criteria and insurance authorization.   Follow Up Recommendations  No OT follow up     Assistance Recommended at Discharge Intermittent Supervision/Assistance  Patient can return home with the following Assistance with cooking/housework;Assist for transportation    Functional Status Assessment  Patient has had a recent decline in their functional status and demonstrates the ability to make significant improvements in function in a reasonable and predictable amount of time.  Equipment Recommendations  None recommended by OT    Recommendations for Other Services       Precautions / Restrictions Precautions Precautions: None Restrictions Weight Bearing  Restrictions: No      Mobility Bed Mobility Overal bed mobility: Modified Independent                  Transfers Overall transfer level: Needs assistance   Transfers: Sit to/from Stand Sit to Stand: Supervision           General transfer comment: therapist managing IV lines, slightly increased time      Balance Overall balance assessment: Mild deficits observed, not formally tested (mild LOB in hallway however able to independently recover)                                         ADL either performed or assessed with clinical judgement   ADL Overall ADL's : Needs assistance/impaired                                       General ADL Comments: Pt able to complete ADL tasks however fatigues easily; would benefit from education on energy conservation     Vision         Perception     Praxis      Pertinent Vitals/Pain Pain Assessment Pain Assessment: Faces Faces Pain Scale: Hurts little more Breathing: normal Negative Vocalization: none Facial Expression: smiling or inexpressive Body Language: tense, distressed pacing, fidgeting Consolability: distracted or reassured by voice/touch PAINAD Score: 2 Pain Location: back; abdomen Pain Descriptors / Indicators: Discomfort Pain Intervention(s): Limited activity within patient's tolerance     Hand Dominance Right   Extremity/Trunk Assessment Upper Extremity Assessment Upper Extremity  Assessment: Overall WFL for tasks assessed   Lower Extremity Assessment Lower Extremity Assessment: Defer to PT evaluation   Cervical / Trunk Assessment Cervical / Trunk Assessment: Normal   Communication Communication Communication: No difficulties   Cognition Arousal/Alertness: Awake/alert Behavior During Therapy: WFL for tasks assessed/performed (appropriately emotional; crying after face time with her 2 yound sons) Overall Cognitive Status: Within Functional Limits for tasks  assessed                                       General Comments       Exercises     Shoulder Instructions      Home Living Family/patient expects to be discharged to:: Private residence Living Arrangements:  (ex spouse, 10,8 yo sons and oldest son (36 yr old - autistic), 2 roommates in the guest house - all work during the day) Available Help at Discharge: Family;Friend(s);Available PRN/intermittently   Home Access: Stairs to enter Entrance Stairs-Number of Steps: 4-5 back door, 8 front door Entrance Stairs-Rails: Right;Left Home Layout: One level     Bathroom Shower/Tub: Teacher, early years/pre: Standard (other toilet is handicapped height) Bathroom Accessibility: Yes How Accessible: Accessible via walker Home Equipment: Shower seat          Prior Functioning/Environment Prior Level of Function : Independent/Modified Independent;Driving (quit job in Dec 2023, walked ~9000 steps/day)             Mobility Comments: pt reports ind ADLs Comments: pt reports ind; states she fatigues eaily adn is just "so tired"        OT Problem List: Decreased strength;Decreased activity tolerance;Decreased knowledge of use of DME or AE;Pain      OT Treatment/Interventions: Self-care/ADL training;Therapeutic exercise;Energy conservation;DME and/or AE instruction;Therapeutic activities;Patient/family education    OT Goals(Current goals can be found in the care plan section) Acute Rehab OT Goals Patient Stated Goal: to cope with her diagnosis OT Goal Formulation: With patient Time For Goal Achievement: 11/22/22 Potential to Achieve Goals: Good  OT Frequency: Min 2X/week    Co-evaluation              AM-PAC OT "6 Clicks" Daily Activity     Outcome Measure Help from another person eating meals?: None Help from another person taking care of personal grooming?: None Help from another person toileting, which includes using toliet, bedpan, or urinal?:  None Help from another person bathing (including washing, rinsing, drying)?: A Little Help from another person to put on and taking off regular upper body clothing?: None Help from another person to put on and taking off regular lower body clothing?: A Little 6 Click Score: 22   End of Session Nurse Communication: Other (comment) (request for nausea meds)  Activity Tolerance: Other (comment) (tnauseated and vomiting) Patient left: in bed;with call bell/phone within reach;with nursing/sitter in room  OT Visit Diagnosis: Unsteadiness on feet (R26.81);Muscle weakness (generalized) (M62.81);Pain Pain - part of body:  (back adn abdomen)                Time: [id number] OT Time Calculation (min): 21 min Charges:  OT General Charges $OT Visit: 1 Visit OT Evaluation $OT Eval Moderate Complexity: [place], OT/L   Acute OT Clinical Specialist [place] Pager [phone] Office [phone]   [facility] 11/08/2022, 5:02 PM

## 2022-11-08 NOTE — Progress Notes (Addendum)
Spoke with the patient regarding biopsies from yesterday.  Endometrial biopsy shows adenocarcinoma with stains again most consistent with an upper GI or pancreaticobiliary primary.    I spoke both with Dr. Vic Ripper and Burr Medico.  While it would be somewhat unusual for an upper GI or pancreatic cancer to metastasize to the endometrium, GI-type tumors of the endometrium are quite rare.  Plan will be to send tumor for medic testing.  Ideally we can do this tomorrow so that we know by the middle of the next week whether there is sufficient tissue for foundation 1 testing.  If not, I discussed with the patient considering a D&C under some light sedation in the operating room to obtain more tumor.  Jeral Pinch MD Gynecologic Oncology

## 2022-11-09 ENCOUNTER — Inpatient Hospital Stay (HOSPITAL_COMMUNITY): Payer: Medicaid Other

## 2022-11-09 ENCOUNTER — Encounter (HOSPITAL_COMMUNITY): Payer: Self-pay | Admitting: Hematology

## 2022-11-09 DIAGNOSIS — C786 Secondary malignant neoplasm of retroperitoneum and peritoneum: Secondary | ICD-10-CM | POA: Diagnosis not present

## 2022-11-09 LAB — CBC
HCT: 25.9 % — ABNORMAL LOW (ref 36.0–46.0)
Hemoglobin: 8 g/dL — ABNORMAL LOW (ref 12.0–15.0)
MCH: 23.6 pg — ABNORMAL LOW (ref 26.0–34.0)
MCHC: 30.9 g/dL (ref 30.0–36.0)
MCV: 76.4 fL — ABNORMAL LOW (ref 80.0–100.0)
Platelets: 199 10*3/uL (ref 150–400)
RBC: 3.39 MIL/uL — ABNORMAL LOW (ref 3.87–5.11)
RDW: 19.3 % — ABNORMAL HIGH (ref 11.5–15.5)
WBC: 1 10*3/uL — CL (ref 4.0–10.5)
nRBC: 0 % (ref 0.0–0.2)

## 2022-11-09 LAB — GLUCOSE, CAPILLARY
Glucose-Capillary: 116 mg/dL — ABNORMAL HIGH (ref 70–99)
Glucose-Capillary: 129 mg/dL — ABNORMAL HIGH (ref 70–99)
Glucose-Capillary: 134 mg/dL — ABNORMAL HIGH (ref 70–99)
Glucose-Capillary: 152 mg/dL — ABNORMAL HIGH (ref 70–99)
Glucose-Capillary: 170 mg/dL — ABNORMAL HIGH (ref 70–99)
Glucose-Capillary: 204 mg/dL — ABNORMAL HIGH (ref 70–99)

## 2022-11-09 LAB — COMPREHENSIVE METABOLIC PANEL
ALT: 36 U/L (ref 0–44)
AST: 14 U/L — ABNORMAL LOW (ref 15–41)
Albumin: 1.5 g/dL — ABNORMAL LOW (ref 3.5–5.0)
Alkaline Phosphatase: 93 U/L (ref 38–126)
Anion gap: 9 (ref 5–15)
BUN: 23 mg/dL — ABNORMAL HIGH (ref 6–20)
CO2: 24 mmol/L (ref 22–32)
Calcium: 7.8 mg/dL — ABNORMAL LOW (ref 8.9–10.3)
Chloride: 102 mmol/L (ref 98–111)
Creatinine, Ser: 0.51 mg/dL (ref 0.44–1.00)
GFR, Estimated: >60 mL/min (ref >60)
Glucose, Bld: 161 mg/dL — ABNORMAL HIGH (ref 70–99)
Potassium: 3.6 mmol/L (ref 3.5–5.1)
Sodium: 135 mmol/L (ref 135–145)
Total Bilirubin: 0.2 mg/dL — ABNORMAL LOW (ref 0.3–1.2)
Total Protein: 4.7 g/dL — ABNORMAL LOW (ref 6.5–8.1)

## 2022-11-09 MED ORDER — LIDOCAINE HCL 1 % IJ SOLN
INTRAMUSCULAR | Status: AC
  Administered 2022-11-09: 10 mL
  Filled 2022-11-09: qty 20

## 2022-11-09 MED ORDER — INSULIN ASPART 100 UNIT/ML IJ SOLN
0.0000 [IU] | Freq: Four times a day (QID) | INTRAMUSCULAR | Status: DC
  Administered 2022-11-09: 5 [IU] via SUBCUTANEOUS
  Administered 2022-11-09: 3 [IU] via SUBCUTANEOUS
  Administered 2022-11-10: 2 [IU] via SUBCUTANEOUS
  Administered 2022-11-10 (×3): 3 [IU] via SUBCUTANEOUS
  Administered 2022-11-11: 2 [IU] via SUBCUTANEOUS
  Administered 2022-11-11: 3 [IU] via SUBCUTANEOUS
  Administered 2022-11-11 – 2022-11-12 (×2): 5 [IU] via SUBCUTANEOUS
  Administered 2022-11-12 – 2022-11-13 (×5): 3 [IU] via SUBCUTANEOUS
  Administered 2022-11-13 (×2): 2 [IU] via SUBCUTANEOUS
  Administered 2022-11-14: 5 [IU] via SUBCUTANEOUS
  Administered 2022-11-14: 3 [IU] via SUBCUTANEOUS
  Administered 2022-11-14: 5 [IU] via SUBCUTANEOUS
  Administered 2022-11-14: 2 [IU] via SUBCUTANEOUS
  Administered 2022-11-15: 5 [IU] via SUBCUTANEOUS

## 2022-11-09 MED ORDER — TRAVASOL 10 % IV SOLN
INTRAVENOUS | Status: AC
  Filled 2022-11-09: qty 858

## 2022-11-09 NOTE — Progress Notes (Signed)
PMT no charge note. Patient aware procedure at the time of my visit.  Patient's sister Joelene Millin was present at bedside.  Family asking about the delay in paracenteses.  We discussed about patient's symptom burden.  Family is appreciative of Dr. Berline Lopes as well as Dr. Burr Medico continuing to follow. PMT will follow over the weekend. Loistine Chance MD [place] [facility]

## 2022-11-09 NOTE — Progress Notes (Signed)
PROGRESS NOTE    Christina [name]  [id number] DOB: 1976/02/15 DOA: 10/19/2022 PCP: [name], MD     Brief Narrative:  Christina [name] is a 47 year old female with history of anxiety, HPV, who presented with complaint of abdominal distention, constipation.  CT abdomen/pelvis showed peritoneal carcinomatosis with extensive metastasis, ascites, multiloculated cystic lesion of the pancreatic tail.  GYN oncology consulted initially .  Underwent paracentesis x 3.  Tumor markers are elevated :CA125, CEA.  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  Oncology, GI following.  S/p  EGD, plan for EUS/biopsy as per GI but has been postponed.  Started  on TPN, PCA pump.  Hospital course complicated by persistent abdominal pain, distention, constipation, poor oral intake.  Palliative care also following for goals of care.  Oncology started chemotherapy.  New events last 24 hours / Subjective: Continues to have some chest tightness, abdominal distention.  Awaiting paracentesis.  Assessment & Plan:   Principal Problem:   Peritoneal carcinomatosis ([place]) Active Problems:   S/P cesarean section   Generalized abdominal pain   Constipation   Ovarian mass   Pancreatic mass   Tobacco abuse   Malignant ascites   Protein-calorie malnutrition (HCC)   Cancer associated pain   High risk medication use   Palliative care encounter   Counseling and coordination of care   Abnormal cervical Papanicolaou smear   Metastatic adenocarcinoma with peritoneal carcinomatosis -Concern of extensive metastatic disease with unknown primary as seen on the CT scan.  CT concerning for bilateral cystic/solid ovarian masses, pancreatic lesion also noted.  CEA, CA125 elevated, CA 19-9 is normal. 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   -GI consulted, status post EGD on 2/9. EGD was  negative for malignancy in the esophagus, stomach or duodenum but was found to have significant extrinsic compression  in the gastric antrum. EUS was initially planned however it was canceled later due to abdominal distention. Now GI does not think EUS will be high yield procedure and its finding may not change patient's management.  EUS can be done later as an outpatient once her symptoms improved. -Underwent Port-A-Cath placement on 2/9. Oncology started chemotherapy. Next cycle due 2/28  -Endometrial biopsy showed metastatic adenocarcinoma.  Dr. Burr Medico following as well as GYN oncology.  Awaiting further molecular testing at this time   Pain management -Palliative on board-recommend Reglan 5 mg 3 times daily, PPI, Decadron 4 mg for inflammation and nausea.  No NG unless she has vomiting. -Remains on PCA pump   Malignant ascites  -Underwent paracentesis on 2/3, 2/6,  2/10, 2/16.   Continue supportive care.   -Continue spironolactone, IV Lasix.   -Repeat paracentesis pending  Chest pain -EKG reviewed independently which revealed normal sinus rhythm -Troponin negative    Pulmonary embolism -Asymptomatic.  CT chest done with contrast for staging purpose showed small embolus on the proximal right lower lobe pulmonary artery.  Currently on full dose Lovenox.  Will change Eliquis when appropriate.  Pancytopenia -In setting of chemotherapy -Granix   Microcytic anemia/iron deficiency/folic acid deficiency -Continue iron supplement, folic acid supplement. S/p 1 unit PRBC transfusion on 2/16   Moderate protein calorie malnutrition:  Hypoalbuminemia:  -Nutritionist consulted and following -Remains on TPN    Hypertension -Norvasc, losartan   DVT prophylaxis: Lovenox  Code Status: Full Family Communication: None at bedside  Disposition Plan:   Status is: Inpatient Remains inpatient appropriate because:  remains on PCA and TPN.  Paracentesis pending   Antimicrobials:  Anti-infectives  (From admission, onward)    None        Objective: Vitals:   11/09/22 1130 11/09/22 1145 11/09/22 1151 11/09/22 1155  BP: (!) 125/90 128/85 125/82 127/84  Pulse:      Resp:      Temp:      TempSrc:      SpO2:      Weight:      Height:        Intake/Output Summary (Last 24 hours) at 11/09/2022 1202 Last data filed at 11/09/2022 [id number] Gross per 24 hour  Intake 1052.63 ml  Output 200 ml  Net 852.63 ml    Filed Weights   11/07/22 0808 11/08/22 0940 11/09/22 0500  Weight: 71 kg 70.3 kg 69.8 kg    Examination:  General exam: Appears calm but uncomfortable  Respiratory system: Clear to auscultation. Respiratory effort normal. No respiratory distress. No conversational dyspnea.  Cardiovascular system: S1 & S2 heard, RRR. No murmurs. No pedal edema. Gastrointestinal system: Abdomen is more distended from yesterday but soft Central nervous system: Alert and oriented. No focal neurological deficits. Speech clear.  Extremities: Symmetric in appearance  Skin: No rashes, lesions or ulcers on exposed skin  Psychiatry: Judgement and insight appear normal. Mood & affect appropriate.   Data Reviewed: I have personally reviewed following labs and imaging studies  CBC: Recent Labs  Lab 11/05/22 0414 11/06/22 0521 11/07/22 0640 11/08/22 0550 11/09/22 0413  WBC 3.5* 2.2* 1.7* 1.0* 1.0*  HGB 8.1* 8.0* 7.8* 8.2* 8.0*  HCT 26.2* 25.7* 25.4* 26.7* 25.9*  MCV 76.6* 76.7* 76.7* 76.9* 76.4*  PLT 171 138* 129* 165 [id number]    Basic Metabolic Panel: Recent Labs  Lab 11/04/22 0638 11/05/22 0414 11/06/22 0521 11/07/22 0640 11/08/22 0550 11/09/22 0413  NA 135 134* 132* 132* 132* 135  K 3.6 3.8 3.7 3.4* 3.7 3.6  CL 109 101 97* 100 100 102  CO2 '22 24 25 25 25 24  '$ GLUCOSE 176* 191* 269* 180* 166* 161*  BUN 24* 19 20 22* 24* 23*  CREATININE 0.54 0.43* 0.40* 0.45 0.52 0.51  CALCIUM 7.6* 8.0* 7.8* 7.7* 7.9* 7.8*  MG 2.1 1.9  --   --  1.8  --   PHOS 3.4 2.9  --   --  3.4  --      GFR: Estimated Creatinine Clearance: 73 mL/min (by C-G formula based on SCr of 0.51 mg/dL). Liver Function Tests: Recent Labs  Lab 11/05/22 0414 11/06/22 0521 11/07/22 0640 11/08/22 0550 11/09/22 0413  AST 48* 25 16 14* 14*  ALT 116* 88* 58* 44 36  ALKPHOS 117 117 102 99 93  BILITOT 0.3 0.4 0.1* 0.2* 0.2*  PROT 4.7* 4.6* 4.3* 4.8* 4.7*  ALBUMIN 1.6* 1.6* 1.7* 1.7* 1.5*    No results for input(s): "LIPASE", "AMYLASE" in the last 168 hours. No results for input(s): "AMMONIA" in the last 168 hours. Coagulation Profile: No results for input(s): "INR", "PROTIME" in the last 168 hours. Cardiac Enzymes: No results for input(s): "CKTOTAL", "CKMB", "CKMBINDEX", "TROPONINI" in the last 168 hours. BNP (last 3 results) No results for input(s): "PROBNP" in the last 8760 hours. HbA1C: No results for input(s): "HGBA1C" in the last 72 hours. CBG: Recent Labs  Lab 11/08/22 1651 11/08/22 2027 11/09/22 0013 11/09/22 0356 11/09/22 0728  GLUCAP 164* 157* 116* 152* 129*    Lipid Profile: Recent Labs    11/08/22 0550  TRIG 104  Thyroid Function Tests: No results for input(s): "TSH", "T4TOTAL", "FREET4", "T3FREE", "THYROIDAB" in the last 72 hours. Anemia Panel: No results for input(s): "VITAMINB12", "FOLATE", "FERRITIN", "TIBC", "IRON", "RETICCTPCT" in the last 72 hours. Sepsis Labs: No results for input(s): "PROCALCITON", "LATICACIDVEN" in the last 168 hours.  No results found for this or any previous visit (from the past 240 hour(s)).    Radiology Studies: No results found.    Scheduled Meds:  amLODipine  10 mg Oral Daily   Chlorhexidine Gluconate Cloth  6 each Topical Daily   clonazepam  0.25 mg Oral BID   dexamethasone (DECADRON) injection  4 mg Intravenous Daily   enoxaparin (LOVENOX) injection  1 mg/kg Subcutaneous Q12H   fentaNYL  1 patch Transdermal Q72H   ferrous sulfate  325 mg Oral Q breakfast   folic acid  1 mg Oral Daily   furosemide  40 mg  Intravenous Daily   insulin aspart  0-15 Units Subcutaneous Q6H   losartan  50 mg Oral Daily   pantoprazole (PROTONIX) IV  40 mg Intravenous Q24H   polyethylene glycol  17 g Oral Q8H   pregabalin  25 mg Oral QHS   senna-docusate  2 tablet Oral QHS   sodium chloride flush  10-40 mL Intracatheter Q12H   spironolactone  25 mg Oral Daily   Tbo-filgastrim (GRANIX) SQ  300 mcg Subcutaneous q1800   Continuous Infusions:  TPN ADULT (ION) 65 mL/hr at 11/09/22 [id number]   TPN ADULT (ION)       LOS: 20 days   Time spent: 30 minutes   Dessa Phi, DO [facility] 11/09/2022, 12:02 PM   Available via Epic secure chat 7am-7pm After these hours, please refer to coverage provider listed on [email or web address]

## 2022-11-09 NOTE — Progress Notes (Signed)
GYN Oncology Progress Note  Reports feeling much better after having fluid drawn off. Having loose stools. Light spotting after the endometrial biopsy. Threw up earlier today. Voiding without difficulty. Walked with PT.  Exam performed by Dr. Berline Lopes: Alert, oriented, appears tired. Lungs clear. Heart murmur noted. Abdomen less distended. Hypo BS, soft. No significant edema noted in LE.  Recent biopsies discussed with patient by Dr. Berline Lopes. Biopsy being sent to [facility] for further testing. If not enough tissue, patient may need to undergo a D&C to obtain more tissue.

## 2022-11-09 NOTE — Progress Notes (Addendum)
Physical Therapy Treatment and Discharge from Acute PT Patient Details Name: Christina [name] MRN: [id number] DOB: April 30, 1976 Today's Date: 11/09/2022   History of Present Illness Christina [name] is a 47 y.o. female admitted with peritoneal carcinomatosis. CT abdomen/pelvis showed peritoneal carcinomatosis with extensive metastasis, ascites, multiloculated cystic lesion of the pancreatic tail. 2/16 Ultrasound-guided  therapeutic paracentesis performed yielding 3.5 liters. PMH: anxiety, HPV    PT Comments    Pt ambulated in hallway and mobilizing modified independently at this time.  Pt reports mostly only up in room and encouraged more hallway ambulation as tolerated.  Pt has met acute PT goals, and PT to sign off at this time.     Recommendations for follow up therapy are one component of a multi-disciplinary discharge planning process, led by the attending physician.  Recommendations may be updated based on patient status, additional functional criteria and insurance authorization.  Follow Up Recommendations  No PT follow up     Assistance Recommended at Discharge PRN  Patient can return home with the following Assistance with cooking/housework;Help with stairs or ramp for entrance   Equipment Recommendations  None recommended by PT    Recommendations for Other Services       Precautions / Restrictions Precautions Precautions: None     Mobility  Bed Mobility Overal bed mobility: Modified Independent                  Transfers Overall transfer level: Modified independent                      Ambulation/Gait Ambulation/Gait assistance: Modified independent (Device/Increase time) Gait Distance (Feet): 300 Feet Assistive device: IV Pole Gait Pattern/deviations: WFL(Within Functional Limits) Gait velocity: dec     General Gait Details: slow cadence, no unsteadiness or LOB observed, pt denies any increase in pain or symptoms   Stairs              Wheelchair Mobility    Modified Rankin (Stroke Patients Only)       Balance                                            Cognition Arousal/Alertness: Awake/alert Behavior During Therapy: WFL for tasks assessed/performed Overall Cognitive Status: Within Functional Limits for tasks assessed                                          Exercises      General Comments        Pertinent Vitals/Pain Pain Assessment Pain Assessment: No/denies pain Pain Intervention(s): Other (comment) (pt requested pain meds from RN end of session (for pain prevention))    Home Living                          Prior Function            PT Goals (current goals can now be found in the care plan section) Progress towards PT goals: Goals met/education completed, patient discharged from PT    Frequency    Min 3X/week      PT Plan Other (comment) (d/c from acute PT)    Co-evaluation  AM-PAC PT "6 Clicks" Mobility   Outcome Measure  Help needed turning from your back to your side while in a flat bed without using bedrails?: None Help needed moving from lying on your back to sitting on the side of a flat bed without using bedrails?: None Help needed moving to and from a bed to a chair (including a wheelchair)?: None Help needed standing up from a chair using your arms (e.g., wheelchair or bedside chair)?: None Help needed to walk in hospital room?: A Little Help needed climbing 3-5 steps with a railing? : A Little 6 Click Score: 22    End of Session   Activity Tolerance: Patient tolerated treatment well Patient left: Other (comment) (BSC) Nurse Communication: Mobility status (RN reports pt is up ad lib in her room) PT Visit Diagnosis: Other abnormalities of gait and mobility (R26.89)     Time: [id number] PT Time Calculation (min) (ACUTE ONLY): 12 min  Charges:  $Gait Training: 8-22 mins           Arlyce Dice,  DPT Physical Therapist Acute Rehabilitation Services Preferred contact method: Secure Chat Weekend Pager Only: [phone] Office: [place] 11/09/2022, 3:34 PM

## 2022-11-09 NOTE — Progress Notes (Signed)
PHARMACY - TOTAL PARENTERAL NUTRITION CONSULT NOTE   Indication:  gastric outlet obstruction with extrinsic compression   Patient Measurements: Height: '4\' 11"'$  (149.9 cm) Weight: 69.8 kg (153 lb 14.1 oz) IBW/kg (Calculated) : 43.2 TPN AdjBW (KG): 49.8 Body mass index is 31.08 kg/m. Usual Weight: 170 lbs  Assessment: 72 yoF with newly dx metastatic adenocarcinoma to peritoneum, with large cystic lesion in pancreatic tail, and bilateral ovaries. Malignant ascites s/p paracentesis x 3. EGD: gastric outlet obstruction w/ extrinsic compression. Pharmacy consulted to manage TPN for nutrition support.   Glucose / Insulin: no hx DM. CBGs 116-182 (goal < 180) - SSI / 24 hrs: 14 units - Insulin in TPN: 20 units - Dexamethasone '10mg'$  IV x1 on 2/14, then (start 2/16) '4mg'$  IV daily Electrolytes:  Na improved to 135. K 3.6. Others, including Corrected Calcium, WNL. Renal: SCr 0.52 stable Hepatic: AST/ALT low/WNL now. Alk Phos WNL. Tbili low.  Triglycerides: 104 (2/21) Intake / Output; MIVF: 57m NG ouptut, ? Only 2019mUOP but also reported have 11 urine occurrences (recorded net I/O of +80265m- No IVF. Lasix '40mg'$  IV daily. Spironolactone '25mg'$  PO daily.   - No PO intake recorded - LBM 2/21  GI Imaging:  2/11 CT abd/pelvis: Multiple dilated loops of small bowel have developed which may represent ileus versus partial or developing small bowel obstruction. Numerous thickened, hyperenhancing loops of distal small bowel, suggestive of infectious or inflammatory enteritis. Abnormal wall thickening of the gastric antrum, which could be secondary to gastritis or malignancy. Large bilateral multi-cystic adnexal masses concerning for primary or metastatic ovarian malignancy. Multiloculated cystic lesion of the pancreatic tail. 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 2/12 abdominal X-ray: Persistent moderate gas distension of large and small bowel suggestive of ileus vs. partial  obstruction  GI Surgeries / Procedures:  2/9 EGD: gastric outlet obstruction with extrinsic compression. 2/16 IR: paracentesis 3.5L out  Central access:  - 2/9: single lumen PAC for chemo - 2/12: PICC for TPN  TPN start date: 2/12 - 2/11: [facility] to [facility]. Will start 2/12, Dr AdhTawanna SoloRN informed.   Nutritional Goals: Goal TPN rate is 65 mL/hr (provides 86 g of protein and 1607 kcals per day)  RD Assessment:  Estimated Needs Total Energy Estimated Needs: 1500-1700 Total Protein Estimated Needs: 75-90g Total Fluid Estimated Needs: 1.5L/day  Current Nutrition:  TPN Diet: full liquids (2/19) 2/7: Boost/Resource Breeze: refusing 2/6: Prosource: refusing  Plan:  At 1800: Continue TPN at goal rate of 65 mL/hr.  Electrolytes in TPN:  Na 154 mEq/L (maxed) K 40 mEq/L Ca 5mE18m Mg 2 mEq/L Phos 10 mmol/L Cl:Ac ratio  2:1 Continue Insulin 20 units in bag Add standard MVI and trace elements to TPN Remove folic acid from TPN and change to PO Continue Moderate SSI but will change to q6h Monitor TPN labs on Mon/Thurs and PRN   JignLindell SpararmD, BCPS Clinical Pharmacist 11/09/2022 9:21 AM

## 2022-11-09 NOTE — Procedures (Signed)
PROCEDURE SUMMARY:  Successful US guided paracentesis from right lateral abdomen.  Yielded 3.1 liters of yellow fluid.  No immediate complications.  Pt tolerated well.   Specimen was not sent for labs.  EBL < 63m  KDocia BarrierPA-C 11/09/2022 2:14 PM

## 2022-11-09 NOTE — Progress Notes (Signed)
Chaplain made an initial attempt to meet with Christina [name] to provide support.  Christina [name] had a visitor and voiced that today was not a good time to talk as she was anticipating a procedure that had been postponed yesterday.  She had also just received some pain medication and was very drowsy.  Chaplain will continue to follow for support and will attempt to come on a different day.  [address] [place] Pager, [phone]

## 2022-11-10 DIAGNOSIS — C786 Secondary malignant neoplasm of retroperitoneum and peritoneum: Secondary | ICD-10-CM | POA: Diagnosis not present

## 2022-11-10 LAB — GLUCOSE, CAPILLARY
Glucose-Capillary: 115 mg/dL — ABNORMAL HIGH (ref 70–99)
Glucose-Capillary: 159 mg/dL — ABNORMAL HIGH (ref 70–99)
Glucose-Capillary: 161 mg/dL — ABNORMAL HIGH (ref 70–99)
Glucose-Capillary: 165 mg/dL — ABNORMAL HIGH (ref 70–99)
Glucose-Capillary: 167 mg/dL — ABNORMAL HIGH (ref 70–99)
Glucose-Capillary: 177 mg/dL — ABNORMAL HIGH (ref 70–99)
Glucose-Capillary: 185 mg/dL — ABNORMAL HIGH (ref 70–99)

## 2022-11-10 LAB — CBC
HCT: 27.1 % — ABNORMAL LOW (ref 36.0–46.0)
Hemoglobin: 8.2 g/dL — ABNORMAL LOW (ref 12.0–15.0)
MCH: 23.2 pg — ABNORMAL LOW (ref 26.0–34.0)
MCHC: 30.3 g/dL (ref 30.0–36.0)
MCV: 76.8 fL — ABNORMAL LOW (ref 80.0–100.0)
Platelets: 230 10*3/uL (ref 150–400)
RBC: 3.53 MIL/uL — ABNORMAL LOW (ref 3.87–5.11)
RDW: 19.7 % — ABNORMAL HIGH (ref 11.5–15.5)
WBC: 1.1 10*3/uL — CL (ref 4.0–10.5)
nRBC: 4.5 % — ABNORMAL HIGH (ref 0.0–0.2)

## 2022-11-10 LAB — BASIC METABOLIC PANEL
Anion gap: 7 (ref 5–15)
BUN: 21 mg/dL — ABNORMAL HIGH (ref 6–20)
CO2: 25 mmol/L (ref 22–32)
Calcium: 7.5 mg/dL — ABNORMAL LOW (ref 8.9–10.3)
Chloride: 98 mmol/L (ref 98–111)
Creatinine, Ser: 0.48 mg/dL (ref 0.44–1.00)
GFR, Estimated: >60 mL/min (ref >60)
Glucose, Bld: 152 mg/dL — ABNORMAL HIGH (ref 70–99)
Potassium: 3.7 mmol/L (ref 3.5–5.1)
Sodium: 130 mmol/L — ABNORMAL LOW (ref 135–145)

## 2022-11-10 LAB — MAGNESIUM: Magnesium: 1.8 mg/dL (ref 1.7–2.4)

## 2022-11-10 LAB — PHOSPHORUS: Phosphorus: 3 mg/dL (ref 2.5–4.6)

## 2022-11-10 MED ORDER — LORATADINE 10 MG PO TABS
10.0000 mg | ORAL_TABLET | Freq: Every day | ORAL | Status: DC
  Administered 2022-11-11 – 2022-11-13 (×3): 10 mg via ORAL
  Filled 2022-11-10 (×6): qty 1

## 2022-11-10 MED ORDER — TRAVASOL 10 % IV SOLN
INTRAVENOUS | Status: AC
  Filled 2022-11-10: qty 858

## 2022-11-10 MED ORDER — ONDANSETRON HCL 4 MG/2ML IJ SOLN
4.0000 mg | Freq: Three times a day (TID) | INTRAMUSCULAR | Status: AC
  Administered 2022-11-10 – 2022-11-12 (×6): 4 mg via INTRAVENOUS
  Filled 2022-11-10 (×6): qty 2

## 2022-11-10 NOTE — Progress Notes (Signed)
Daily Progress Note   Patient Name: Christina [name]       Date: 11/10/2022 DOB: 08/18/1976  Age: 47 y.o. MRN#: [id number] Attending Physician: Christina Phi, DO Primary Care Physician: Christina Sacramento, MD Admit Date: 10/19/2022 Length of Stay: 21 days  Reason for Consultation/Follow-up: Establishing goals of care and Symptom Management  Subjective:   CC: complains of nausea vomiting.     Subjective:  Underwent paracentesis, is taking PO Dilaudid for pain PRN, is on rescue IV Dilaudid PRN while she is in hospital. Remains on  fentanyl patch 50 mcg change Q 72 hours.  Also on  Lyrica 25 mg daily.Patient also receiving Klonopin 0.'25mg'$  BID, dexamethasone '4mg'$  daily.   When seeing patient this morning, she is laying  in bed, complains of nausea vomiting.   Review of Systems Feeling tired today Objective:   Vital Signs:  BP 122/79 (BP Location: Left Arm)   Pulse (!) 105   Temp 98.4 F (36.9 C) (Oral)   Resp 18   Ht '4\' 11"'$  (1.499 m)   Wt 69.8 kg   LMP 10/12/2022   SpO2 95%   BMI 31.08 kg/m   Physical Exam: General: NAD, alert, laying in bed Eyes: no drainage noted HENT: dry mucous membranes Cardiovascular: RRR Respiratory: no increased work of breathing noted, not in respiratory distress Abdomen: distended Extremities: moving Ues appropriately  Skin: no rashes or lesions on visible skin Neuro: A&Ox4, following commands easily Psych: appropriately answers all questions  Imaging:  I personally reviewed recent imaging.   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   Palliative medicine team consulted to assist with symptom management.   Recommendations/Plan: # Complex medical decision making/goals of care:  -Continuing with full scope of care at this time.Patient will need to follow up with outpatient PMT at [facility] to hopefully continue supporting goals for medical care moving forward.                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  ok to continue IV for now.    -NO NG tube unless has vomiting. Trial of scheduled Zofran for 48 hours.    Anxiety, in setting of recent cancer diagnosis  -Continue Klonopin 0.'25mg'$  BID                 -Constipation, acute on chronic in setting of malignant ascites   # Discharge Planning: TBD  -Will need follow up with PMT at [facility].   Please reach out to provider Jobe Gibbon NP at time of discharge to inform of this as will need close follow up.   Discussed with: patient  Thank you for allowing the palliative care team to participate in the care [name].  Mod MDM Loistine Chance MD Palliative Care Provider PMT # [phone]  If patient remains symptomatic despite maximum doses, please call PMT at [phone] between 0700 and 1900. Outside of these hours, please call attending, as PMT does not have night coverage.

## 2022-11-10 NOTE — Progress Notes (Signed)
PROGRESS NOTE    Christina [name]  [id number] DOB: Sep 01, 1976 DOA: 10/19/2022 PCP: [name], MD     Brief Narrative:  Christina [name] is a 47 year old female with history of anxiety, HPV, who presented with complaint of abdominal distention, constipation.  CT abdomen/pelvis showed peritoneal carcinomatosis with extensive metastasis, ascites, multiloculated cystic lesion of the pancreatic tail.  GYN oncology consulted initially .  Underwent paracentesis x 3.  Tumor markers are elevated :CA125, CEA.  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  Oncology, GI following.  S/p  EGD, plan for EUS/biopsy as per GI but has been postponed.  Started  on TPN, PCA pump.  Hospital course complicated by persistent abdominal pain, distention, constipation, poor oral intake.  Palliative care also following for goals of care.  Oncology started chemotherapy.  New events last 24 hours / Subjective: Chest tightness improved after paracentesis yesterday.  Not eating much at all.  Remains on TPN.  Nauseous with vomiting.  Assessment & Plan:   Principal Problem:   Peritoneal carcinomatosis ([place]) Active Problems:   S/P cesarean section   Generalized abdominal pain   Constipation   Ovarian mass   Pancreatic mass   Tobacco abuse   Malignant ascites   Protein-calorie malnutrition (HCC)   Cancer associated pain   High risk medication use   Palliative care encounter   Counseling and coordination of care   Abnormal cervical Papanicolaou smear   Metastatic adenocarcinoma with peritoneal carcinomatosis -Concern of extensive metastatic disease with unknown primary as seen on the CT scan.  CT concerning for bilateral cystic/solid ovarian masses, pancreatic lesion also noted.  CEA, CA125 elevated, CA 19-9 is normal. 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   -GI consulted,  status post EGD on 2/9. EGD was negative for malignancy in the esophagus, stomach or duodenum but was found to have significant extrinsic compression  in the gastric antrum. EUS was initially planned however it was canceled later due to abdominal distention. Now GI does not think EUS will be high yield procedure and its finding may not change patient's management.  EUS can be done later as an outpatient once her symptoms improved. -Underwent Port-A-Cath placement on 2/9. Oncology started chemotherapy. Next cycle due 2/28  -Endometrial biopsy showed metastatic adenocarcinoma.  Dr. Burr Medico following as well as GYN oncology.  Awaiting further molecular testing at this time   Pain management -Palliative on board-recommend Reglan 5 mg 3 times daily, PPI, Decadron 4 mg for inflammation and nausea.  No NG unless she has vomiting. -Now off PCA pump, continue fentanyl patch, Dilaudid   Malignant ascites  -Underwent paracentesis on 2/3, 2/6,  2/10, 2/16, 2/23.   Continue supportive care.   -Continue spironolactone, IV Lasix.    Chest pain -EKG reviewed independently which revealed normal sinus rhythm -Troponin negative -Resolved    Pulmonary embolism -Asymptomatic.  CT chest done with contrast for staging purpose showed small embolus on the proximal right lower lobe pulmonary artery.  Currently on full dose Lovenox.  Will change Eliquis when appropriate.  Pancytopenia -In setting of chemotherapy -Granix -Added Claritin.  Previously had reported spasms using Claritin and was added to allergy list.  Discussed with patient, patient would like to be removed from allergy list.  She has the spasms more with Benadryl use.   Microcytic anemia/iron deficiency/folic acid deficiency -Continue iron supplement, folic acid supplement. S/p 1 unit PRBC transfusion on  2/16   Moderate protein calorie malnutrition:  Hypoalbuminemia:  -Nutritionist consulted and following -Remains on TPN    Hypertension -Norvasc,  losartan   DVT prophylaxis: Lovenox  Code Status: Full Family Communication: None at bedside  Disposition Plan:   Status is: Inpatient Remains inpatient appropriate because: remains on TPN.  Further oncology recommendations pending  Antimicrobials:  Anti-infectives (From admission, onward)    None        Objective: Vitals:   11/09/22 1155 11/09/22 1312 11/09/22 2019 11/10/22 0353  BP: 127/84 121/75 124/71 122/79  Pulse:  (!) 102 96 (!) 105  Resp:  '20 18 18  '$ Temp:  98.3 F (36.8 C) 98.2 F (36.8 C) 98.4 F (36.9 C)  TempSrc:  Oral Oral Oral  SpO2:  97% 96% 95%  Weight:      Height:        Intake/Output Summary (Last 24 hours) at 11/10/2022 1251 Last data filed at 11/10/2022 1019 Gross per 24 hour  Intake 1370.38 ml  Output 750 ml  Net 620.38 ml    Filed Weights   11/07/22 0808 11/08/22 0940 11/09/22 0500  Weight: 71 kg 70.3 kg 69.8 kg    Examination:  General exam: Appears calm but uncomfortable  Respiratory system: Clear to auscultation. Respiratory effort normal. No respiratory distress. No conversational dyspnea.  Cardiovascular system: S1 & S2 heard, RRR. No murmurs. No pedal edema. Gastrointestinal system: Abdomen is soft, nondistended Central nervous system: Alert and oriented. No focal neurological deficits. Speech clear.  Extremities: Symmetric in appearance  Skin: No rashes, lesions or ulcers on exposed skin  Psychiatry: Judgement and insight appear normal. Mood & affect appropriate.   Data Reviewed: I have personally reviewed following labs and imaging studies  CBC: Recent Labs  Lab 11/06/22 0521 11/07/22 0640 11/08/22 0550 11/09/22 0413 11/10/22 0636  WBC 2.2* 1.7* 1.0* 1.0* 1.1*  HGB 8.0* 7.8* 8.2* 8.0* 8.2*  HCT 25.7* 25.4* 26.7* 25.9* 27.1*  MCV 76.7* 76.7* 76.9* 76.4* 76.8*  PLT 138* 129* 165 199 [id number]    Basic Metabolic Panel: Recent Labs  Lab 11/04/22 0638 11/05/22 0414 11/06/22 0521 11/07/22 0640 11/08/22 0550  11/09/22 0413 11/10/22 0636  NA 135 134* 132* 132* 132* 135 130*  K 3.6 3.8 3.7 3.4* 3.7 3.6 3.7  CL 109 101 97* 100 100 102 98  CO2 '22 24 25 25 25 24 25  '$ GLUCOSE 176* 191* 269* 180* 166* 161* 152*  BUN 24* 19 20 22* 24* 23* 21*  CREATININE 0.54 0.43* 0.40* 0.45 0.52 0.51 0.48  CALCIUM 7.6* 8.0* 7.8* 7.7* 7.9* 7.8* 7.5*  MG 2.1 1.9  --   --  1.8  --  1.8  PHOS 3.4 2.9  --   --  3.4  --  3.0    GFR: Estimated Creatinine Clearance: 73 mL/min (by C-G formula based on SCr of 0.48 mg/dL). Liver Function Tests: Recent Labs  Lab 11/05/22 0414 11/06/22 0521 11/07/22 0640 11/08/22 0550 11/09/22 0413  AST 48* 25 16 14* 14*  ALT 116* 88* 58* 44 36  ALKPHOS 117 117 102 99 93  BILITOT 0.3 0.4 0.1* 0.2* 0.2*  PROT 4.7* 4.6* 4.3* 4.8* 4.7*  ALBUMIN 1.6* 1.6* 1.7* 1.7* 1.5*    No results for input(s): "LIPASE", "AMYLASE" in the last 168 hours. No results for input(s): "AMMONIA" in the last 168 hours. Coagulation Profile: No results for input(s): "INR", "PROTIME" in the last 168 hours. Cardiac Enzymes: No results for input(s): "CKTOTAL", "CKMB", "CKMBINDEX", "TROPONINI" in  the last 168 hours. BNP (last 3 results) No results for input(s): "PROBNP" in the last 8760 hours. HbA1C: No results for input(s): "HGBA1C" in the last 72 hours. CBG: Recent Labs  Lab 11/09/22 2021 11/10/22 0010 11/10/22 0355 11/10/22 0716 11/10/22 1152  GLUCAP 134* 167* 115* 165* 185*    Lipid Profile: Recent Labs    11/08/22 0550  TRIG 104    Thyroid Function Tests: No results for input(s): "TSH", "T4TOTAL", "FREET4", "T3FREE", "THYROIDAB" in the last 72 hours. Anemia Panel: No results for input(s): "VITAMINB12", "FOLATE", "FERRITIN", "TIBC", "IRON", "RETICCTPCT" in the last 72 hours. Sepsis Labs: No results for input(s): "PROCALCITON", "LATICACIDVEN" in the last 168 hours.  No results found for this or any previous visit (from the past 240 hour(s)).    Radiology Studies: US  Paracentesis  Result Date: 11/09/2022 INDICATION: Patient with metastatic adenocarcinoma with peritoneal carcinomatosis, recurrent ascites. Request made for therapeutic paracentesis. EXAM: ULTRASOUND GUIDED THERAPEUTIC PARACENTESIS MEDICATIONS: 10 mL 1% lidocaine COMPLICATIONS: None immediate. PROCEDURE: Informed written consent was obtained from the patient after a discussion of the risks, benefits and alternatives to treatment. A timeout was performed prior to the initiation of the procedure. Initial ultrasound scanning demonstrates a moderate amount of ascites within the right lower abdominal quadrant. The right lower abdomen was prepped and draped in the usual sterile fashion. 1% lidocaine was used for local anesthesia. Following this, a 19 gauge, 7-cm, Yueh catheter was introduced. An ultrasound image was saved for documentation purposes. The paracentesis was performed. The catheter was removed and a dressing was applied. The patient tolerated the procedure well without immediate post procedural complication. FINDINGS: A total of approximately 3.1 liters of yellow fluid was removed. IMPRESSION: Successful ultrasound-guided paracentesis yielding 3.1 liters of peritoneal fluid. Read by: Brynda Greathouse PA-C Electronically Signed   By: Aletta Edouard M.D.   On: 11/09/2022 16:43      Scheduled Meds:  amLODipine  10 mg Oral Daily   Chlorhexidine Gluconate Cloth  6 each Topical Daily   clonazepam  0.25 mg Oral BID   dexamethasone (DECADRON) injection  4 mg Intravenous Daily   enoxaparin (LOVENOX) injection  1 mg/kg Subcutaneous Q12H   fentaNYL  1 patch Transdermal Q72H   ferrous sulfate  325 mg Oral Q breakfast   folic acid  1 mg Oral Daily   furosemide  40 mg Intravenous Daily   insulin aspart  0-15 Units Subcutaneous Q6H   loratadine  10 mg Oral Daily   losartan  50 mg Oral Daily   ondansetron (ZOFRAN) IV  4 mg Intravenous Q8H   pantoprazole (PROTONIX) IV  40 mg Intravenous Q24H   polyethylene  glycol  17 g Oral Q8H   pregabalin  25 mg Oral QHS   senna-docusate  2 tablet Oral QHS   sodium chloride flush  10-40 mL Intracatheter Q12H   spironolactone  25 mg Oral Daily   Tbo-filgastrim (GRANIX) SQ  300 mcg Subcutaneous q1800   Continuous Infusions:  TPN ADULT (ION) 65 mL/hr at 11/09/22 1730   TPN ADULT (ION)       LOS: 21 days   Time spent: 25 minutes   Dessa Phi, DO [facility] 11/10/2022, 12:51 PM   Available via Epic secure chat 7am-7pm After these hours, please refer to coverage provider listed on [email or web address]

## 2022-11-10 NOTE — Progress Notes (Signed)
PHARMACY - TOTAL PARENTERAL NUTRITION CONSULT NOTE   Indication:  gastric outlet obstruction with extrinsic compression   Patient Measurements: Height: '4\' 11"'$  (149.9 cm) Weight: 69.8 kg (153 lb 14.1 oz) IBW/kg (Calculated) : 43.2 TPN AdjBW (KG): 49.8 Body mass index is 31.08 kg/m. 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 Malignant ascites s/p paracentesis x 3. EGD: gastric outlet obstruction w/ extrinsic compression. Pharmacy consulted to manage TPN for nutrition support.   Glucose / Insulin: no hx DM. CBGs 134-170 (goal < 180) - SSI / 24 hrs: 10 units - Insulin in TPN: 20 units - Dexamethasone '10mg'$  IV x1 on 2/14, then (start 2/16) '4mg'$  IV daily Electrolytes:  Na remains low 130. K 3.7. Others, including Corrected Calcium, WNL. Renal: SCr 0.48 stable Hepatic: AST/ALT low/WNL now. Alk Phos WNL. Tbili low.  Triglycerides: 104 (2/21) Intake / Output; MIVF: ?unsure accuracy of I/O documentation - No IVF. Lasix '40mg'$  IV daily. Spironolactone '25mg'$  PO daily.   - No PO intake recorded - LBM 2/21  GI Imaging:  2/11 CT abd/pelvis: Multiple dilated loops of small bowel have developed which may represent ileus versus partial or developing small bowel obstruction. Numerous thickened, hyperenhancing loops of distal small bowel, suggestive of infectious or inflammatory enteritis. Abnormal wall thickening of the gastric antrum, which could be secondary to gastritis or malignancy. Large bilateral multi-cystic adnexal masses concerning for primary or metastatic ovarian malignancy. Multiloculated cystic lesion of the pancreatic tail. 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 2/12 abdominal X-ray: Persistent moderate gas distension of large and small bowel suggestive of ileus vs. partial obstruction  GI Surgeries / Procedures:  2/9 EGD: gastric outlet  obstruction with extrinsic compression. 2/16 IR: paracentesis 3.5L out 2/23 IR: paracentesis 3.1L out  Central access:  - 2/9: single lumen PAC for chemo - 2/12: PICC for TPN  TPN start date: 2/12 - 2/11: [facility] unable to compound TPN. Will start 2/12, Dr Tawanna Solo & RN informed.   Nutritional Goals: Goal TPN rate is 65 mL/hr (provides 86 g of protein and 1607 kcals per day)  RD Assessment:  Estimated Needs Total Energy Estimated Needs: 1500-1700 Total Protein Estimated Needs: 75-90g Total Fluid Estimated Needs: 1.5L/day  Current Nutrition:  TPN Diet: full liquids (2/19) 2/7: Boost/Resource Breeze: refusing 2/6: Prosource: refusing  Plan:  At 1800: Continue TPN at goal rate of 65 mL/hr.  Electrolytes in TPN:  Na 154 mEq/L (maxed) K 45 mEq/L (increased) Ca 60mq/L Mg 4 mEq/L (increase) Phos 10 mmol/L Cl:Ac ratio  2:1 Continue Insulin 20 units in bag Add standard MVI and trace elements to TPN Remove folic acid from TPN and change to PO Continue Moderate SSI but will change to q6h Monitor TPN labs on Mon/Thurs and PRN   EPeggyann Juba PharmD, BCPS Pharmacy: [phone]/24/2024 7:41 AM

## 2022-11-10 NOTE — Progress Notes (Signed)
   11/10/22 0751  Provider Notification  Provider Name/Title Dr. Maylene Roes  Date Provider Notified 11/10/22  Time Provider Notified 0745  Method of Notification Page  Notification Reason Critical Result (WBC 1.1)  Provider response No new orders (Pt on Granix)  Date of Provider Response 11/10/22  Time of Provider Response [phone]

## 2022-11-11 DIAGNOSIS — C786 Secondary malignant neoplasm of retroperitoneum and peritoneum: Secondary | ICD-10-CM | POA: Diagnosis not present

## 2022-11-11 LAB — CBC
HCT: 25.8 % — ABNORMAL LOW (ref 36.0–46.0)
Hemoglobin: 7.9 g/dL — ABNORMAL LOW (ref 12.0–15.0)
MCH: 23.7 pg — ABNORMAL LOW (ref 26.0–34.0)
MCHC: 30.6 g/dL (ref 30.0–36.0)
MCV: 77.2 fL — ABNORMAL LOW (ref 80.0–100.0)
Platelets: 231 10*3/uL (ref 150–400)
RBC: 3.34 MIL/uL — ABNORMAL LOW (ref 3.87–5.11)
RDW: 19.5 % — ABNORMAL HIGH (ref 11.5–15.5)
WBC: 3.2 10*3/uL — ABNORMAL LOW (ref 4.0–10.5)
nRBC: 2.8 % — ABNORMAL HIGH (ref 0.0–0.2)

## 2022-11-11 LAB — DIFFERENTIAL
Abs Immature Granulocytes: 0.2 10*3/uL — ABNORMAL HIGH (ref 0.00–0.07)
Basophils Absolute: 0.1 10*3/uL (ref 0.0–0.1)
Basophils Relative: 2 %
Eosinophils Absolute: 0.1 10*3/uL (ref 0.0–0.5)
Eosinophils Relative: 2 %
Immature Granulocytes: 6 %
Lymphocytes Relative: 36 %
Lymphs Abs: 1.2 10*3/uL (ref 0.7–4.0)
Monocytes Absolute: 0.6 10*3/uL (ref 0.1–1.0)
Monocytes Relative: 18 %
Neutro Abs: 1.2 10*3/uL — ABNORMAL LOW (ref 1.7–7.7)
Neutrophils Relative %: 36 %

## 2022-11-11 LAB — BASIC METABOLIC PANEL
Anion gap: 7 (ref 5–15)
BUN: 22 mg/dL — ABNORMAL HIGH (ref 6–20)
CO2: 25 mmol/L (ref 22–32)
Calcium: 7.6 mg/dL — ABNORMAL LOW (ref 8.9–10.3)
Chloride: 99 mmol/L (ref 98–111)
Creatinine, Ser: 0.48 mg/dL (ref 0.44–1.00)
GFR, Estimated: >60 mL/min (ref >60)
Glucose, Bld: 155 mg/dL — ABNORMAL HIGH (ref 70–99)
Potassium: 3.6 mmol/L (ref 3.5–5.1)
Sodium: 131 mmol/L — ABNORMAL LOW (ref 135–145)

## 2022-11-11 LAB — GLUCOSE, CAPILLARY
Glucose-Capillary: 144 mg/dL — ABNORMAL HIGH (ref 70–99)
Glucose-Capillary: 214 mg/dL — ABNORMAL HIGH (ref 70–99)
Glucose-Capillary: 198 mg/dL — ABNORMAL HIGH (ref 70–99)

## 2022-11-11 LAB — MAGNESIUM: Magnesium: 2.2 mg/dL (ref 1.7–2.4)

## 2022-11-11 MED ORDER — POTASSIUM CHLORIDE CRYS ER 20 MEQ PO TBCR
40.0000 meq | EXTENDED_RELEASE_TABLET | Freq: Once | ORAL | Status: AC
  Administered 2022-11-11: 40 meq via ORAL
  Filled 2022-11-11: qty 2

## 2022-11-11 MED ORDER — TRAVASOL 10 % IV SOLN
INTRAVENOUS | Status: AC
  Filled 2022-11-11: qty 858

## 2022-11-11 NOTE — Progress Notes (Signed)
PHARMACY - TOTAL PARENTERAL NUTRITION CONSULT NOTE   Indication:  gastric outlet obstruction with extrinsic compression   Patient Measurements: Height: '4\' 11"'$  (149.9 cm) Weight: 69.8 kg (153 lb 14.1 oz) IBW/kg (Calculated) : 43.2 TPN AdjBW (KG): 49.8 Body mass index is 31.08 kg/m. 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 Malignant ascites s/p paracentesis x 3. EGD: gastric outlet obstruction w/ extrinsic compression. Pharmacy consulted to manage TPN for nutrition support.   Glucose / Insulin: no hx DM. CBGs 144-185 (goal < 180) - SSI / 24 hrs: 11 units - Insulin in TPN: 20 units - Dexamethasone '10mg'$  IV x1 on 2/14, then (start 2/16) '4mg'$  IV daily Electrolytes:  Na remains low 131 (max concentration in TPN). K 3.6. Others, including Corrected Calcium, WNL. Renal: SCr 0.48 stable Hepatic: 2/23 AST/ALT low/WNL now. Alk Phos WNL. Tbili low.  Triglycerides: 104 (2/21) Intake / Output; MIVF: ?unsure accuracy of I/O documentation  - 4 unmeasured urine and 3 stool occurrences - No IVF. Lasix '40mg'$  IV daily. Spironolactone '25mg'$  PO daily.   - No PO intake recorded  GI Imaging:  2/11 CT abd/pelvis: Multiple dilated loops of small bowel have developed which may represent ileus versus partial or developing small bowel obstruction. Numerous thickened, hyperenhancing loops of distal small bowel, suggestive of infectious or inflammatory enteritis. Abnormal wall thickening of the gastric antrum, which could be secondary to gastritis or malignancy. Large bilateral multi-cystic adnexal masses concerning for primary or metastatic ovarian malignancy. Multiloculated cystic lesion of the pancreatic tail. 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 2/12 abdominal X-ray: Persistent moderate gas distension of large and small bowel suggestive of ileus vs. partial  obstruction  GI Surgeries / Procedures:  2/9 EGD: gastric outlet obstruction with extrinsic compression. 2/16 IR: paracentesis 3.5L out 2/23 IR: paracentesis 3.1L out  Central access:  - 2/9: single lumen PAC for chemo - 2/12: PICC for TPN  TPN start date: 2/12 - 2/11: [facility] unable to compound TPN. Will start 2/12, Dr Tawanna Solo & RN informed.   Nutritional Goals: Goal TPN rate is 65 mL/hr (provides 86 g of protein and 1607 kcals per day)  RD Assessment:  Estimated Needs Total Energy Estimated Needs: 1500-1700 Total Protein Estimated Needs: 75-90g Total Fluid Estimated Needs: 1.5L/day  Current Nutrition:  TPN Diet: full liquids (2/19) 2/7: Boost/Resource Breeze: refusing 2/6: Prosource: refusing  Plan:  Now: KCl 41mq PO x 1  At 1800: Continue TPN at goal rate of 65 mL/hr.  Electrolytes in TPN:  Na 154 mEq/L (maxed) K 50 mEq/L (increase) Ca 539m/L Mg 4 mEq/L Phos 10 mmol/L Cl:Ac ratio  2:1 Continue Insulin 20 units in bag Add standard MVI and trace elements to TPN Continue Moderate SSI q6h Monitor TPN labs on Mon/Thurs and PRN   ErPeggyann JubaPharmD, BCPS Pharmacy: [phone]/25/2024 7:05 AM

## 2022-11-11 NOTE — Progress Notes (Signed)
PROGRESS NOTE    Christina [name]  [id number] DOB: 02/26/1976 DOA: 10/19/2022 PCP: [name], MD     Brief Narrative:  Christina [name] is a 47 year old female with history of anxiety, HPV, who presented with complaint of abdominal distention, constipation.  CT abdomen/pelvis showed peritoneal carcinomatosis with extensive metastasis, ascites, multiloculated cystic lesion of the pancreatic tail.  GYN oncology consulted initially .  Underwent paracentesis x 3.  Tumor markers are elevated :CA125, CEA.  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  Oncology, GI following.  S/p  EGD, plan for EUS/biopsy as per GI but has been postponed.  Started  on TPN, PCA pump.  Hospital course complicated by persistent abdominal pain, distention, constipation, poor oral intake.  Palliative care also following for goals of care.  Oncology started chemotherapy.  New events last 24 hours / Subjective: Still very poor PO intake.   Assessment & Plan:   Principal Problem:   Peritoneal carcinomatosis ([place]) Active Problems:   S/P cesarean section   Generalized abdominal pain   Constipation   Ovarian mass   Pancreatic mass   Tobacco abuse   Malignant ascites   Protein-calorie malnutrition (HCC)   Cancer associated pain   High risk medication use   Palliative care encounter   Counseling and coordination of care   Abnormal cervical Papanicolaou smear   Metastatic adenocarcinoma with peritoneal carcinomatosis -Concern of extensive metastatic disease with unknown primary as seen on the CT scan.  CT concerning for bilateral cystic/solid ovarian masses, pancreatic lesion also noted.  CEA, CA125 elevated, CA 19-9 is normal. 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   -GI consulted, status post EGD on 2/9. EGD was negative for malignancy in the esophagus, stomach or duodenum  but was found to have significant extrinsic compression  in the gastric antrum. EUS was initially planned however it was canceled later due to abdominal distention. Now GI does not think EUS will be high yield procedure and its finding may not change patient's management.  EUS can be done later as an outpatient once her symptoms improved. -Underwent Port-A-Cath placement on 2/9. Oncology started chemotherapy. Next cycle due 2/28  -Endometrial biopsy showed metastatic adenocarcinoma.  Dr. Burr Medico following as well as GYN oncology.  Awaiting further molecular testing at this time   Pain management -Palliative on board-recommend Reglan 5 mg 3 times daily, PPI, Decadron 4 mg for inflammation and nausea.  No NG unless she has vomiting. -Now off PCA pump, continue fentanyl patch, Dilaudid   Malignant ascites  -Underwent paracentesis on 2/3, 2/6,  2/10, 2/16, 2/23.   Continue supportive care.   -Continue spironolactone, IV Lasix.    Chest pain -EKG reviewed independently which revealed normal sinus rhythm -Troponin negative -Resolved    Pulmonary embolism -Asymptomatic.  CT chest done with contrast for staging purpose showed small embolus on the proximal right lower lobe pulmonary artery.  Currently on full dose Lovenox.  Will change Eliquis when appropriate.  Pancytopenia -In setting of chemotherapy -Granix -Added Claritin.  Previously had reported spasms using Claritin and was added to allergy list.  Discussed with patient, patient would like to be removed from allergy list.  She has the spasms more with Benadryl use.   Microcytic anemia/iron deficiency/folic acid deficiency -Continue iron supplement, folic acid supplement. S/p 1 unit PRBC transfusion on 2/16   Moderate protein calorie malnutrition:  Hypoalbuminemia:  -Nutritionist consulted and following -  Remains on TPN    Hypertension -Norvasc, losartan   DVT prophylaxis: Lovenox  Code Status: Full Family Communication: None at  bedside  Disposition Plan:   Status is: Inpatient Remains inpatient appropriate because: remains on TPN.  Further oncology recommendations pending  Antimicrobials:  Anti-infectives (From admission, onward)    None        Objective: Vitals:   11/10/22 0353 11/10/22 1331 11/10/22 1947 11/11/22 0531  BP: 122/79 131/82 133/84 131/87  Pulse: (!) 105 (!) 110 (!) 104 94  Resp: 18 20 19 18  $ Temp: 98.4 F (36.9 C) 99.4 F (37.4 C) 98.3 F (36.8 C) 98.2 F (36.8 C)  TempSrc: Oral Oral Oral Oral  SpO2: 95% 96% 97% 97%  Weight:      Height:        Intake/Output Summary (Last 24 hours) at 11/11/2022 1142 Last data filed at 11/11/2022 0900 Gross per 24 hour  Intake 643.53 ml  Output 600 ml  Net 43.53 ml    Filed Weights   11/07/22 0808 11/08/22 0940 11/09/22 0500  Weight: 71 kg 70.3 kg 69.8 kg    Examination:  General exam: Appears calm but uncomfortable  Respiratory system: Clear to auscultation. Respiratory effort normal. No respiratory distress. No conversational dyspnea.  Cardiovascular system: S1 & S2 heard, RRR. No murmurs. No pedal edema. Gastrointestinal system: Abdomen is soft, nondistended Central nervous system: Alert and oriented. No focal neurological deficits. Speech clear.  Extremities: Symmetric in appearance  Skin: No rashes, lesions or ulcers on exposed skin  Psychiatry: Judgement and insight appear normal. Mood & affect appropriate.   Data Reviewed: I have personally reviewed following labs and imaging studies  CBC: Recent Labs  Lab 11/07/22 0640 11/08/22 0550 11/09/22 0413 11/10/22 0636 11/11/22 0500  WBC 1.7* 1.0* 1.0* 1.1* 3.2*  NEUTROABS  --   --   --   --  1.2*  HGB 7.8* 8.2* 8.0* 8.2* 7.9*  HCT 25.4* 26.7* 25.9* 27.1* 25.8*  MCV 76.7* 76.9* 76.4* 76.8* 77.2*  PLT 129* 165 199 230 [id number]    Basic Metabolic Panel: Recent Labs  Lab 11/05/22 0414 11/06/22 0521 11/07/22 0640 11/08/22 0550 11/09/22 0413 11/10/22 0636 11/11/22 0500  NA  134*   < > 132* 132* 135 130* 131*  K 3.8   < > 3.4* 3.7 3.6 3.7 3.6  CL 101   < > 100 100 102 98 99  CO2 24   < > 25 25 24 25 25  $ GLUCOSE 191*   < > 180* 166* 161* 152* 155*  BUN 19   < > 22* 24* 23* 21* 22*  CREATININE 0.43*   < > 0.45 0.52 0.51 0.48 0.48  CALCIUM 8.0*   < > 7.7* 7.9* 7.8* 7.5* 7.6*  MG 1.9  --   --  1.8  --  1.8 2.2  PHOS 2.9  --   --  3.4  --  3.0  --    < > = values in this interval not displayed.    GFR: Estimated Creatinine Clearance: 73 mL/min (by C-G formula based on SCr of 0.48 mg/dL). Liver Function Tests: Recent Labs  Lab 11/05/22 0414 11/06/22 0521 11/07/22 0640 11/08/22 0550 11/09/22 0413  AST 48* 25 16 14* 14*  ALT 116* 88* 58* 44 36  ALKPHOS 117 117 102 99 93  BILITOT 0.3 0.4 0.1* 0.2* 0.2*  PROT 4.7* 4.6* 4.3* 4.8* 4.7*  ALBUMIN 1.6* 1.6* 1.7* 1.7* 1.5*    No results for  input(s): "LIPASE", "AMYLASE" in the last 168 hours. No results for input(s): "AMMONIA" in the last 168 hours. Coagulation Profile: No results for input(s): "INR", "PROTIME" in the last 168 hours. Cardiac Enzymes: No results for input(s): "CKTOTAL", "CKMB", "CKMBINDEX", "TROPONINI" in the last 168 hours. BNP (last 3 results) No results for input(s): "PROBNP" in the last 8760 hours. HbA1C: No results for input(s): "HGBA1C" in the last 72 hours. CBG: Recent Labs  Lab 11/10/22 1152 11/10/22 1612 11/10/22 2029 11/10/22 2301 11/11/22 0533  GLUCAP 185* 159* 177* 161* 144*    Lipid Profile: No results for input(s): "CHOL", "HDL", "LDLCALC", "TRIG", "CHOLHDL", "LDLDIRECT" in the last 72 hours.  Thyroid Function Tests: No results for input(s): "TSH", "T4TOTAL", "FREET4", "T3FREE", "THYROIDAB" in the last 72 hours. Anemia Panel: No results for input(s): "VITAMINB12", "FOLATE", "FERRITIN", "TIBC", "IRON", "RETICCTPCT" in the last 72 hours. Sepsis Labs: No results for input(s): "PROCALCITON", "LATICACIDVEN" in the last 168 hours.  No results found for this or any  previous visit (from the past 240 hour(s)).    Radiology Studies: US Paracentesis  Result Date: 11/09/2022 INDICATION: Patient with metastatic adenocarcinoma with peritoneal carcinomatosis, recurrent ascites. Request made for therapeutic paracentesis. EXAM: ULTRASOUND GUIDED THERAPEUTIC PARACENTESIS MEDICATIONS: 10 mL 1% lidocaine COMPLICATIONS: None immediate. PROCEDURE: Informed written consent was obtained from the patient after a discussion of the risks, benefits and alternatives to treatment. A timeout was performed prior to the initiation of the procedure. Initial ultrasound scanning demonstrates a moderate amount of ascites within the right lower abdominal quadrant. The right lower abdomen was prepped and draped in the usual sterile fashion. 1% lidocaine was used for local anesthesia. Following this, a 19 gauge, 7-cm, Yueh catheter was introduced. An ultrasound image was saved for documentation purposes. The paracentesis was performed. The catheter was removed and a dressing was applied. The patient tolerated the procedure well without immediate post procedural complication. FINDINGS: A total of approximately 3.1 liters of yellow fluid was removed. IMPRESSION: Successful ultrasound-guided paracentesis yielding 3.1 liters of peritoneal fluid. Read by: Brynda Greathouse PA-C Electronically Signed   By: Aletta Edouard M.D.   On: 11/09/2022 16:43      Scheduled Meds:  amLODipine  10 mg Oral Daily   Chlorhexidine Gluconate Cloth  6 each Topical Daily   clonazepam  0.25 mg Oral BID   dexamethasone (DECADRON) injection  4 mg Intravenous Daily   enoxaparin (LOVENOX) injection  1 mg/kg Subcutaneous Q12H   fentaNYL  1 patch Transdermal Q72H   ferrous sulfate  325 mg Oral Q breakfast   folic acid  1 mg Oral Daily   furosemide  40 mg Intravenous Daily   insulin aspart  0-15 Units Subcutaneous Q6H   loratadine  10 mg Oral Daily   losartan  50 mg Oral Daily   ondansetron (ZOFRAN) IV  4 mg Intravenous  Q8H   pantoprazole (PROTONIX) IV  40 mg Intravenous Q24H   polyethylene glycol  17 g Oral Q8H   pregabalin  25 mg Oral QHS   senna-docusate  2 tablet Oral QHS   sodium chloride flush  10-40 mL Intracatheter Q12H   spironolactone  25 mg Oral Daily   Tbo-filgastrim (GRANIX) SQ  300 mcg Subcutaneous q1800   Continuous Infusions:  TPN ADULT (ION) 65 mL/hr at 11/10/22 1805   TPN ADULT (ION)       LOS: 22 days   Time spent: 25 minutes   Dessa Phi, DO [facility] 11/11/2022, 11:42 AM   Available via Epic secure  chat 7am-7pm After these hours, please refer to coverage provider listed on [email or web address]

## 2022-11-11 NOTE — Progress Notes (Signed)
Daily Progress Note   Patient Name: Christina [name]       Date: 11/11/2022 DOB: 1976/06/08  Age: 47 y.o. MRN#: [id number] Attending Physician: Dessa Phi, DO Primary Care Physician: [name], MD Admit Date: 10/19/2022 Length of Stay: 22 days  Reason for Consultation/Follow-up: Establishing goals of care and Symptom Management  Subjective:   CC: complains of nausea vomiting.   Having bowel movements. PO intake minimal, remains on TPN.   Subjective:  Using rescue IV Dilaudid PRN   Remains on  fentanyl patch 50 mcg change Q 72 hours.  Also on  Lyrica 25 mg daily.Patient also receiving Klonopin 0.'25mg'$  BID, dexamethasone '4mg'$  daily.   When seeing patient this morning, she is laying  in bed, complains of vomiting.   Review of Systems Feeling tired today Objective:   Vital Signs:  BP 127/81 (BP Location: Left Arm)   Pulse (!) 110   Temp 98.2 F (36.8 C) (Oral)   Resp 20   Ht '4\' 11"'$  (1.499 m)   Wt 69.8 kg   LMP 10/12/2022   SpO2 94%   BMI 31.08 kg/m   Physical Exam: General: NAD, alert, laying in bed Eyes: no drainage noted HENT: dry mucous membranes Cardiovascular: RRR Respiratory: no increased work of breathing noted, not in respiratory distress Abdomen: distended Extremities: moving Ues appropriately  Skin: no rashes or lesions on visible skin Neuro: A&Ox4, following commands easily Psych: appropriately answers all questions  Imaging:  I personally reviewed recent imaging.   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  Palliative medicine team consulted  to assist with symptom management.   Recommendations/Plan: # Complex medical decision making/goals of care:  -Continuing with full scope of care at this time.Patient will need to follow up with outpatient PMT at [facility] to hopefully continue supporting goals for medical care moving forward.                -  Code Status: Full Code  # Symptom management:  - Pain, Acute on chronic in the setting of malignant ascites with metastatic adenocarcinoma to peritoneum with large cystic lesion in pancreatic tail and bilateral ovaries                   - Fentanyl patch 50 mcg Q 72 hours.                 -   PO hydromorphone and IV PRN    -Continue dexamethasone '4mg'$  daily at this time.  ok to continue IV for now.    -NO NG tube unless has vomiting. Trial of scheduled Zofran for 48 hours is ongoing.    Anxiety, in setting of recent cancer diagnosis  -Continue Klonopin 0.'25mg'$  BID                 -Constipation, acute on chronic in setting of malignant ascites   # Discharge Planning: TBD  -Will need follow up with PMT at [facility]   Please reach out to provider Jobe Gibbon NP at time of discharge to inform of this as will need close follow up.   Discussed with: patient  Thank you for allowing the palliative care team to participate in the care Christina [name].  Mod MDM Loistine Chance MD Palliative Care Provider PMT # [phone]  If patient remains symptomatic despite maximum doses, please call PMT at [phone] between 0700 and 1900. Outside of these hours, please call attending, as PMT does not have night coverage.

## 2022-11-11 NOTE — Progress Notes (Signed)
   11/11/22 1200  Spiritual Encounters  Type of Visit Initial;Follow up  Care provided to: Patient  Referral source Chaplain team  Reason for visit Urgent spiritual support  OnCall Visit Yes  Spiritual Framework  Presenting Themes Meaning/purpose/sources of inspiration;Goals in life/care;Values and beliefs;Significant life change;Coping tools;Impactful experiences and emotions;Courage hope and growth;Rituals and practive  Values/beliefs  (Faith and Family)  Community/Connection Family;Significant other;Friend(s)  Needs/Challenges/Barriers  (Young mother of three)  Patient Stress Factors Exhausted;Family relationships;Financial concerns;Health changes;Lack of caregivers;Loss of control;Major life changes  Family Stress Factors Family relationships;Loss of control;Major life changes  Interventions  Spiritual Care Interventions Made Established relationship of care and support;Compassionate presence;Reflective listening;Normalization of emotions;Explored ethical dilemma;Decision-making support/facilitation;Reconciliation with self/others;Narrative/life review;Explored values/beliefs/practices/strengths;Meaning making;Mindfulness intervention  Spiritual Care Plan  Spiritual Care Issues Still Outstanding Chaplain will continue to follow;Referring to oncoming chaplain for further support   On Call Chaplain met with patient at bedside -  Debr was alert and able to communicate and welcomed the visit from [place]. Introduced spiritual care to Vanette - she spoke at length of her life narrative and what challenges she was experiencing right now due to diagnosis.  The needs of her three young children with special needs was forefront in her mind and emotions at this time. Chaplain provided spiritual care and comfort.  Working with guided meditation as well.  Assured that spiritual care services would be available for her in her stay at [facility].Marland Kitchen Spoke of her coping skills and wanting to be at home with ehr  family. Ended visit with a departing blessing after prayer.

## 2022-11-12 ENCOUNTER — Other Ambulatory Visit: Payer: Self-pay

## 2022-11-12 DIAGNOSIS — C786 Secondary malignant neoplasm of retroperitoneum and peritoneum: Secondary | ICD-10-CM | POA: Diagnosis not present

## 2022-11-12 LAB — GLUCOSE, CAPILLARY
Glucose-Capillary: 134 mg/dL — ABNORMAL HIGH (ref 70–99)
Glucose-Capillary: 151 mg/dL — ABNORMAL HIGH (ref 70–99)
Glucose-Capillary: 152 mg/dL — ABNORMAL HIGH (ref 70–99)
Glucose-Capillary: 163 mg/dL — ABNORMAL HIGH (ref 70–99)
Glucose-Capillary: 210 mg/dL — ABNORMAL HIGH (ref 70–99)

## 2022-11-12 LAB — CBC
HCT: 25.7 % — ABNORMAL LOW (ref 36.0–46.0)
Hemoglobin: 7.9 g/dL — ABNORMAL LOW (ref 12.0–15.0)
MCH: 23.6 pg — ABNORMAL LOW (ref 26.0–34.0)
MCHC: 30.7 g/dL (ref 30.0–36.0)
MCV: 76.7 fL — ABNORMAL LOW (ref 80.0–100.0)
Platelets: 340 10*3/uL (ref 150–400)
RBC: 3.35 MIL/uL — ABNORMAL LOW (ref 3.87–5.11)
RDW: 19.7 % — ABNORMAL HIGH (ref 11.5–15.5)
WBC: 17.3 10*3/uL — ABNORMAL HIGH (ref 4.0–10.5)
nRBC: 1.2 % — ABNORMAL HIGH (ref 0.0–0.2)

## 2022-11-12 LAB — COMPREHENSIVE METABOLIC PANEL
ALT: 52 U/L — ABNORMAL HIGH (ref 0–44)
AST: 28 U/L (ref 15–41)
Albumin: <1.5 g/dL — ABNORMAL LOW (ref 3.5–5.0)
Alkaline Phosphatase: 87 U/L (ref 38–126)
Anion gap: 7 (ref 5–15)
BUN: 19 mg/dL (ref 6–20)
CO2: 25 mmol/L (ref 22–32)
Calcium: 7.8 mg/dL — ABNORMAL LOW (ref 8.9–10.3)
Chloride: 101 mmol/L (ref 98–111)
Creatinine, Ser: 0.44 mg/dL (ref 0.44–1.00)
GFR, Estimated: >60 mL/min (ref >60)
Glucose, Bld: 184 mg/dL — ABNORMAL HIGH (ref 70–99)
Potassium: 4.1 mmol/L (ref 3.5–5.1)
Sodium: 133 mmol/L — ABNORMAL LOW (ref 135–145)
Total Bilirubin: 0.4 mg/dL (ref 0.3–1.2)
Total Protein: 4.5 g/dL — ABNORMAL LOW (ref 6.5–8.1)

## 2022-11-12 LAB — PHOSPHORUS: Phosphorus: 2.9 mg/dL (ref 2.5–4.6)

## 2022-11-12 LAB — TRIGLYCERIDES: Triglycerides: 118 mg/dL (ref <150)

## 2022-11-12 LAB — MAGNESIUM: Magnesium: 1.7 mg/dL (ref 1.7–2.4)

## 2022-11-12 MED ORDER — OLANZAPINE 5 MG PO TBDP
5.0000 mg | ORAL_TABLET | Freq: Every day | ORAL | Status: DC
  Administered 2022-11-12 – 2022-11-17 (×6): 5 mg via ORAL
  Filled 2022-11-12 (×6): qty 1

## 2022-11-12 MED ORDER — TRAVASOL 10 % IV SOLN
INTRAVENOUS | Status: AC
  Filled 2022-11-12: qty 856.8

## 2022-11-12 NOTE — Progress Notes (Signed)
PROGRESS NOTE    Christina [name]  [id number] DOB: 10-13-1975 DOA: 10/19/2022 PCP: [name], MD     Brief Narrative:  Christina [name] is a 47 year old female with history of anxiety, HPV, who presented with complaint of abdominal distention, constipation.  CT abdomen/pelvis showed peritoneal carcinomatosis with extensive metastasis, ascites, multiloculated cystic lesion of the pancreatic tail.  GYN oncology consulted initially .  Underwent paracentesis x 3.  Tumor markers are elevated :CA125, CEA.  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  Oncology, GI following.  S/p  EGD, plan for EUS/biopsy as per GI but has been postponed.  Started  on TPN, PCA pump.  Hospital course complicated by persistent abdominal pain, distention, constipation, poor oral intake.  Palliative care also following for goals of care.  Oncology started chemotherapy.  New events last 24 hours / Subjective: Ate a little bit more yesterday.  No new complaints, seems to have a little bit more energy today  Assessment & Plan:   Principal Problem:   Peritoneal carcinomatosis ([place]) Active Problems:   S/P cesarean section   Generalized abdominal pain   Constipation   Ovarian mass   Pancreatic mass   Tobacco abuse   Malignant ascites   Protein-calorie malnutrition (HCC)   Cancer associated pain   High risk medication use   Palliative care encounter   Counseling and coordination of care   Abnormal cervical Papanicolaou smear   Metastatic adenocarcinoma with peritoneal carcinomatosis -Concern of extensive metastatic disease with unknown primary as seen on the CT scan.  CT concerning for bilateral cystic/solid ovarian masses, pancreatic lesion also noted.  CEA, CA125 elevated, CA 19-9 is normal. 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   -GI consulted, status post EGD on 2/9.  EGD was negative for malignancy in the esophagus, stomach or duodenum but was found to have significant extrinsic compression  in the gastric antrum. EUS was initially planned however it was canceled later due to abdominal distention. Now GI does not think EUS will be high yield procedure and its finding may not change patient's management.  EUS can be done later as an outpatient once her symptoms improved. -Underwent Port-A-Cath placement on 2/9. Oncology started chemotherapy. Next cycle due 2/28  -Endometrial biopsy showed metastatic adenocarcinoma.  Dr. Burr Medico following as well as GYN oncology.  Awaiting further molecular testing at this time   Pain management -Appreciate palliative care team management  Malignant ascites  -Underwent paracentesis on 2/3, 2/6,  2/10, 2/16, 2/23.   Continue supportive care.   -Continue spironolactone, IV Lasix.    Chest pain -EKG reviewed independently which revealed normal sinus rhythm -Troponin negative -Resolved    Pulmonary embolism -Asymptomatic.  CT chest done with contrast for staging purpose showed small embolus on the proximal right lower lobe pulmonary artery.  Currently on full dose Lovenox.  Will change Eliquis when appropriate.  Pancytopenia -In setting of chemotherapy -Granix -Added Claritin.  Previously had reported spasms using Claritin and was added to allergy list.  Discussed with patient, patient would like to be removed from allergy list.  She has the spasms more with Benadryl use.   Microcytic anemia/iron deficiency/folic acid deficiency -Continue iron supplement, folic acid supplement. S/p 1 unit PRBC transfusion on 2/16   Moderate protein calorie malnutrition:  Hypoalbuminemia:  -Nutritionist consulted and following -Remains on TPN    Hypertension -Norvasc, losartan   DVT prophylaxis: Lovenox  Code Status: Full Family Communication: None at bedside  Disposition Plan:   Status is: Inpatient Remains inpatient appropriate  because: remains on TPN.  Further oncology recommendations pending  Antimicrobials:  Anti-infectives (From admission, onward)    None        Objective: Vitals:   11/11/22 2046 11/12/22 0551 11/12/22 1149 11/12/22 1315  BP: 127/82 121/88 (!) 122/91 (!) 131/96  Pulse: 99 99 (!) 107 (!) 106  Resp: '18 18  20  '$ Temp: 98.2 F (36.8 C) 98.2 F (36.8 C)  98.4 F (36.9 C)  TempSrc: Oral Oral  Oral  SpO2: 97% 97% 98% 96%  Weight:      Height:        Intake/Output Summary (Last 24 hours) at 11/12/2022 1343 Last data filed at 11/12/2022 1247 Gross per 24 hour  Intake 1732.93 ml  Output 1400 ml  Net 332.93 ml    Filed Weights   11/07/22 0808 11/08/22 0940 11/09/22 0500  Weight: 71 kg 70.3 kg 69.8 kg    Examination:  General exam: Appears calm Respiratory system: Clear to auscultation. Respiratory effort normal. No respiratory distress. No conversational dyspnea.  Cardiovascular system: S1 & S2 heard, RRR. No murmurs. No pedal edema. Gastrointestinal system: Abdomen is soft, mildly distended  Central nervous system: Alert and oriented. No focal neurological deficits. Speech clear.  Extremities: Symmetric in appearance  Skin: No rashes, lesions or ulcers on exposed skin  Psychiatry: Judgement and insight appear normal. Mood & affect appropriate.   Data Reviewed: I have personally reviewed following labs and imaging studies  CBC: Recent Labs  Lab 11/08/22 0550 11/09/22 0413 11/10/22 0636 11/11/22 0500 11/12/22 0500  WBC 1.0* 1.0* 1.1* 3.2* 17.3*  NEUTROABS  --   --   --  1.2*  --   HGB 8.2* 8.0* 8.2* 7.9* 7.9*  HCT 26.7* 25.9* 27.1* 25.8* 25.7*  MCV 76.9* 76.4* 76.8* 77.2* 76.7*  PLT 165 199 230 231 [id number]    Basic Metabolic Panel: Recent Labs  Lab 11/08/22 0550 11/09/22 0413 11/10/22 0636 11/11/22 0500 11/12/22 0500  NA 132* 135 130* 131* 133*  K 3.7 3.6 3.7 3.6 4.1  CL 100 102 98 99 101  CO2 '25 24 25 25 25  '$ GLUCOSE 166* 161* 152* 155* 184*  BUN 24* 23* 21*  22* 19  CREATININE 0.52 0.51 0.48 0.48 0.44  CALCIUM 7.9* 7.8* 7.5* 7.6* 7.8*  MG 1.8  --  1.8 2.2 1.7  PHOS 3.4  --  3.0  --  2.9    GFR: Estimated Creatinine Clearance: 73 mL/min (by C-G formula based on SCr of 0.44 mg/dL). Liver Function Tests: Recent Labs  Lab 11/06/22 0521 11/07/22 0640 11/08/22 0550 11/09/22 0413 11/12/22 0500  AST 25 16 14* 14* 28  ALT 88* 58* 44 36 52*  ALKPHOS 117 102 99 93 87  BILITOT 0.4 0.1* 0.2* 0.2* 0.4  PROT 4.6* 4.3* 4.8* 4.7* 4.5*  ALBUMIN 1.6* 1.7* 1.7* 1.5* <1.5*    No results for input(s): "LIPASE", "AMYLASE" in the last 168 hours. No results for input(s): "AMMONIA" in the last 168 hours. Coagulation Profile: No results for input(s): "INR", "PROTIME" in the last 168 hours. Cardiac Enzymes: No results for input(s): "CKTOTAL", "CKMB", "CKMBINDEX", "TROPONINI" in the last 168 hours. BNP (last 3 results) No results for input(s): "PROBNP" in the last 8760 hours. HbA1C: No results for input(s): "HGBA1C" in the last 72 hours. CBG: Recent Labs  Lab 11/11/22 1207 11/11/22 1813 11/12/22 0004 11/12/22 0555 11/12/22  [place]*    Lipid Profile: Recent Labs    11/12/22 0500  TRIG 118    Thyroid Function Tests: No results for input(s): "TSH", "T4TOTAL", "FREET4", "T3FREE", "THYROIDAB" in the last 72 hours. Anemia Panel: No results for input(s): "VITAMINB12", "FOLATE", "FERRITIN", "TIBC", "IRON", "RETICCTPCT" in the last 72 hours. Sepsis Labs: No results for input(s): "PROCALCITON", "LATICACIDVEN" in the last 168 hours.  No results found for this or any previous visit (from the past 240 hour(s)).    Radiology Studies: No results found.    Scheduled Meds:  amLODipine  10 mg Oral Daily   Chlorhexidine Gluconate Cloth  6 each Topical Daily   clonazepam  0.25 mg Oral BID   dexamethasone (DECADRON) injection  4 mg Intravenous Daily   enoxaparin (LOVENOX) injection  1 mg/kg Subcutaneous Q12H    fentaNYL  1 patch Transdermal Q72H   ferrous sulfate  325 mg Oral Q breakfast   folic acid  1 mg Oral Daily   furosemide  40 mg Intravenous Daily   insulin aspart  0-15 Units Subcutaneous Q6H   loratadine  10 mg Oral Daily   losartan  50 mg Oral Daily   OLANZapine zydis  5 mg Oral QHS   pantoprazole (PROTONIX) IV  40 mg Intravenous Q24H   polyethylene glycol  17 g Oral Q8H   pregabalin  25 mg Oral QHS   senna-docusate  2 tablet Oral QHS   sodium chloride flush  10-40 mL Intracatheter Q12H   spironolactone  25 mg Oral Daily   Tbo-filgastrim (GRANIX) SQ  300 mcg Subcutaneous q1800   Continuous Infusions:  TPN ADULT (ION) 65 mL/hr at 11/11/22 1725   TPN ADULT (ION)       LOS: 23 days   Time spent: 25 minutes   Dessa Phi, DO [facility] 11/12/2022, 1:43 PM   Available via Epic secure chat 7am-7pm After these hours, please refer to coverage provider listed on [email or web address]

## 2022-11-12 NOTE — Progress Notes (Signed)
PHARMACY - TOTAL PARENTERAL NUTRITION CONSULT NOTE   Indication:  gastric outlet obstruction with extrinsic compression   Patient Measurements: Height: '4\' 11"'$  (149.9 cm) Weight: 69.8 kg (153 lb 14.1 oz) IBW/kg (Calculated) : 43.2 TPN AdjBW (KG): 49.8 Body mass index is 31.08 kg/m. Usual Weight: 170 lbs  Assessment: 55 yoF with newly dx metastatic adenocarcinoma to peritoneum, with large cystic lesion in pancreatic tail, and bilateral ovaries. Malignant ascites s/p paracentesis x 3. EGD: gastric outlet obstruction w/ extrinsic compression. Pharmacy consulted to manage TPN for nutrition support.   Glucose / Insulin: no hx DM. CBGs 150-210s (goal < 180) - SSI / 24 hrs: 14 units - Insulin in TPN: 20 units - Dexamethasone '10mg'$  IV x1 on 2/14, then (start 2/16) '4mg'$  IV daily Electrolytes:  lytes stable and wnl except Na at 133 (max concentration in TPN). Renal: SCr stable Hepatic: LFTs mostly stable, ALT up to 52. Tbili ok. Triglycerides: stable Intake / Output; MIVF: ?unsure accuracy of I/O documentation  - 3 unmeasured urine and 6 stool occurrences - No IVF. Lasix '40mg'$  IV daily. Spironolactone '25mg'$  PO daily.   - No PO intake recorded  GI Imaging:  2/11 CT abd/pelvis: Multiple dilated loops of small bowel have developed which may represent ileus versus partial or developing small bowel obstruction. Numerous thickened, hyperenhancing loops of distal small bowel, suggestive of infectious or inflammatory enteritis. Abnormal wall thickening of the gastric antrum, which could be secondary to gastritis or malignancy. Large bilateral multi-cystic adnexal masses concerning for primary or metastatic ovarian malignancy. Multiloculated cystic lesion of the pancreatic tail. 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 2/12 abdominal X-ray: Persistent moderate gas distension of large and small bowel suggestive of ileus vs. partial obstruction  GI Surgeries / Procedures:  2/9 EGD:  gastric outlet obstruction with extrinsic compression. 2/16 IR: paracentesis 3.5L out 2/23 IR: paracentesis 3.1L out  Central access:  - 2/9: single lumen PAC for chemo - 2/12: PICC for TPN  TPN start date: 2/12  Nutritional Goals: Goal TPN rate is 68m/hr (provides 86 g of protein and 1612 kcals per day)  RD Assessment:  Estimated Needs Total Energy Estimated Needs: 1500-1700 Total Protein Estimated Needs: 75-90g Total Fluid Estimated Needs: 1.5L/day  Current Nutrition:  TPN Diet: full liquids (2/19) Had been refusing PO supplements   Plan:  At 1800: Continue TPN at goal rate of 70 mL/hr.  Electrolytes in TPN:  Na 154 mEq/L (maxed) K 50 mEq/L Ca 592m/L Mg 4 mEq/L Phos 10 mmol/L Cl:Ac ratio  2:1 Increase Insulin to 30 units in bag Add standard MVI and trace elements to TPN Continue Moderate SSI q6h Monitor TPN labs on Mon/Thurs and PRN. Recheck Bmet, Mg, and Phos tomorrow  NaElenor QuinonesPharmD, BCPS, BCColoradolinical Pharmacist 11/12/2022 8:02 AM

## 2022-11-12 NOTE — Progress Notes (Signed)
Cancertype ID sent to [place]. On surgical pathology sample [id number].  Faxed order form to [facility] [phone].  Faxed confirmation received.  Also faxed copy to [facility] in [facility].

## 2022-11-12 NOTE — Progress Notes (Signed)
Christina [name]   DOB:05-31-1976   [id number]   [id number]  Med/onc follow up note  Subjective: Patient complains of intermittent nausea today.  She is on full liquid now, did try chicken noodle soup, etc. Still feels bloated.     Vitals:   11/12/22 1149 11/12/22 1315  BP: (!) 122/91 (!) 131/96  Pulse: (!) 107 (!) 106  Resp:  20  Temp:  98.4 F (36.9 C)  SpO2: 98% 96%    Body mass index is 31.08 kg/m.  Intake/Output Summary (Last 24 hours) at 11/12/2022 2135 Last data filed at 11/12/2022 2121 Gross per 24 hour  Intake 825.14 ml  Output 2550 ml  Net -1724.86 ml     Sclerae unicteric  Oropharynx clear  No peripheral adenopathy  Lungs clear -- no rales or rhonchi  Heart regular rate and rhythm  Abdomen distended with mild diffuse tenderness.  MSK no focal spinal tenderness, no peripheral edema  Neuro nonfocal    CBG (last 3)  Recent Labs    11/12/22 0555 11/12/22 1146 11/12/22 1753  GLUCAP 151* 163* 210*     Labs:   Urine Studies No results for input(s): "UHGB", "CRYS" in the last 72 hours.  Invalid input(s): "UACOL", "UAPR", "USPG", "UPH", "UTP", "UGL", "UKET", "UBIL", "UNIT", "UROB", "ULEU", "UEPI", "UWBC", "URBC", "UBAC", "CAST", "UCOM", "BILUA"  Basic Metabolic Panel: Recent Labs  Lab 11/08/22 0550 11/09/22 0413 11/10/22 0636 11/11/22 0500 11/12/22 0500  NA 132* 135 130* 131* 133*  K 3.7 3.6 3.7 3.6 4.1  CL 100 102 98 99 101  CO2 '25 24 25 25 25  '$ GLUCOSE 166* 161* 152* 155* 184*  BUN 24* 23* 21* 22* 19  CREATININE 0.52 0.51 0.48 0.48 0.44  CALCIUM 7.9* 7.8* 7.5* 7.6* 7.8*  MG 1.8  --  1.8 2.2 1.7  PHOS 3.4  --  3.0  --  2.9   GFR Estimated Creatinine Clearance: 73 mL/min (by C-G formula based on SCr of 0.44 mg/dL). Liver Function Tests: Recent Labs  Lab 11/06/22 0521 11/07/22 0640 11/08/22 0550 11/09/22 0413 11/12/22 0500  AST 25 16 14* 14* 28  ALT 88* 58* 44 36 52*  ALKPHOS 117 102 99 93 87  BILITOT 0.4 0.1* 0.2* 0.2* 0.4  PROT  4.6* 4.3* 4.8* 4.7* 4.5*  ALBUMIN 1.6* 1.7* 1.7* 1.5* <1.5*   No results for input(s): "LIPASE", "AMYLASE" in the last 168 hours. No results for input(s): "AMMONIA" in the last 168 hours. Coagulation profile No results for input(s): "INR", "PROTIME" in the last 168 hours.   CBC: Recent Labs  Lab 11/08/22 0550 11/09/22 0413 11/10/22 0636 11/11/22 0500 11/12/22 0500  WBC 1.0* 1.0* 1.1* 3.2* 17.3*  NEUTROABS  --   --   --  1.2*  --   HGB 8.2* 8.0* 8.2* 7.9* 7.9*  HCT 26.7* 25.9* 27.1* 25.8* 25.7*  MCV 76.9* 76.4* 76.8* 77.2* 76.7*  PLT 165 199 230 231 340   Cardiac Enzymes: No results for input(s): "CKTOTAL", "CKMB", "CKMBINDEX", "TROPONINI" in the last 168 hours. BNP: Invalid input(s): "POCBNP" CBG: Recent Labs  Lab 11/11/22 1813 11/12/22 0004 11/12/22 0555 11/12/22 1146 11/12/22 1753  GLUCAP 198* 152* 151* 163* 210*   D-Dimer No results for input(s): "DDIMER" in the last 72 hours. Hgb A1c No results for input(s): "HGBA1C" in the last 72 hours. Lipid Profile Recent Labs    11/12/22 0500  TRIG 118   Thyroid function studies No results for input(s): "TSH", "T4TOTAL", "T3FREE", "THYROIDAB" in the last 72  hours.  Invalid input(s): "FREET3" Anemia work up No results for input(s): "VITAMINB12", "FOLATE", "FERRITIN", "TIBC", "IRON", "RETICCTPCT" in the last 72 hours. Microbiology No results found for this or any previous visit (from the past 240 hour(s)).     Studies:  No results found.  Assessment: 47 y.o. 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 Malignant ascites status post paracentesisX3 Recurrent N/V and constipation, secondary to #1, partial bowel obstruction due to the extensive peritoneal metastasis Moderate protein and calorie malnutrition Small PE    Plan:  -lab reviewed, wbc elevated, secondary to GCSF, which I will cancel today   -I recommend stay on clear liquid now, she is not tolerating full liquid  -please repeat abdominal US to see if she needs another paracentesis  -she probably needs more time to better control her symptoms, so I will proceed second cycle chemo this Wednesday  -CancerID test was requested today, the result will take a few weeks to return.  -I will f/u.    Truitt Merle, MD 11/12/2022

## 2022-11-12 NOTE — Progress Notes (Signed)
Chaplain worked to engage in a follow-up visit with Christina [name] following chaplain referral/visit yesterday.  Christina [name] voiced that she was tired and wanted to rest.  Chaplain honored her request and offered support as needed.  Christina [name], MDiv  11/12/22 1400  Spiritual Encounters  Type of Visit Initial;Follow up  Care provided to: Patient

## 2022-11-12 NOTE — Progress Notes (Signed)
Daily Progress Note   Patient Name: Christina [name]       Date: 11/12/2022 DOB: 19-Jan-1976  Age: 47 y.o. MRN#: [id number] Attending Physician: Christina Phi, DO Primary Care Physician: Christina Sacramento, MD Admit Date: 10/19/2022 Length of Stay: 23 days  Reason for Consultation/Follow-up: Establishing goals of care and Symptom Management  Subjective:   CC: vomited earlier today while taking her pills.  Having bowel movements. PO intake minimal, remains on TPN.   Subjective:  Using rescue IV Dilaudid PRN   Remains on  fentanyl patch 50 mcg change Q 72 hours.  Also on  Lyrica 25 mg daily.Patient also receiving Klonopin 0.'25mg'$  BID, dexamethasone '4mg'$  IV daily.   When seeing patient this morning, she is laying  in bed, complains of vomiting. She believes the vomiting is due to pill burden, she will try to space out her pills in the morning.   Review of Systems Feeling tired today Objective:   Vital Signs:  BP (!) 131/96 (BP Location: Left Arm)   Pulse (!) 106   Temp 98.4 F (36.9 C) (Oral)   Resp 20   Ht '4\' 11"'$  (1.499 m)   Wt 69.8 kg   LMP 10/12/2022   SpO2 96%   BMI 31.08 kg/m   Physical Exam: General: NAD, alert, laying in bed Eyes: no drainage noted HENT: dry mucous membranes Cardiovascular: RRR Respiratory: no increased work of breathing noted, not in respiratory distress Abdomen: distended Extremities: moving Ues appropriately  Skin: no rashes or lesions on visible skin Neuro: A&Ox4, following commands easily Psych: appropriately answers all questions  Imaging:  I personally reviewed recent imaging.   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  Palliative medicine team consulted to assist with symptom management.   Recommendations/Plan: # Complex medical decision making/goals of care:  -Continuing with full scope of care at this time.Patient will need to follow up with outpatient PMT at [facility] to hopefully continue supporting goals for medical care moving forward.                -  Code Status: Full Code  # Symptom management:  - Pain, Acute on chronic in the setting of malignant ascites with metastatic adenocarcinoma to peritoneum with large cystic lesion in pancreatic tail and bilateral ovaries                   - Fentanyl patch 50 mcg Q 72 hours.                 -   PO hydromorphone and IV PRN    -Continue dexamethasone '4mg'$  daily at this time.  ok to continue IV for now due to ongoing vomiting.   -NO NG tube unless has vomiting. Trial of scheduled Zofran for 48 hours is done, patient doesn't want to be on Reglan anymore, will attempt PO Olanzapine for 7 days and monitor.    Anxiety, in setting of recent cancer diagnosis  -Continue Klonopin 0.'25mg'$  BID                 -Constipation, acute on chronic in setting of malignant ascites   #  Discharge Planning: TBD  -Will need follow up with PMT at [facility].   Please reach out to provider Christina Gibbon NP at time of discharge to inform of this as will need close follow up.   Discussed with: patient   Thank you for allowing the palliative care team to participate in the care Christina [name].  Mod MDM Christina Chance MD Palliative Care Provider PMT # [phone]  If patient remains symptomatic despite maximum doses, please call PMT at [phone] between 0700 and 1900. Outside of these hours, please call attending, as PMT does not have night coverage.

## 2022-11-12 NOTE — Progress Notes (Signed)
Occupational Therapy Treatment Patient Details Name: Christina [name] MRN: [id number] DOB: 1975/12/07 Today's Date: 11/12/2022   History of present illness Christina [name] is a 47 y.o. female admitted with peritoneal carcinomatosis. CT abdomen/pelvis showed peritoneal carcinomatosis with extensive metastasis, ascites, multiloculated cystic lesion of the pancreatic tail. 2/16 Ultrasound-guided  therapeutic paracentesis performed yielding 3.5 liters. PMH: anxiety, HPV   OT comments  Patient was educated on ECT and calming strategies. Patient verbalized and demonstrated understanding with youtube app on phone and carryover from education during session. Patient's sister in room later endorsed that patient would have caregiver support at home. Patient was provided with handouts for ECT as well. Patient endorsed being at baseline at this time. OT to sign off.    Recommendations for follow up therapy are one component of a multi-disciplinary discharge planning process, led by the attending physician.  Recommendations may be updated based on patient status, additional functional criteria and insurance authorization.    Follow Up Recommendations  No OT follow up     Assistance Recommended at Discharge Intermittent Supervision/Assistance  Patient can return home with the following  Assistance with cooking/housework;Assist for transportation   Equipment Recommendations  None recommended by OT    Recommendations for Other Services      Precautions / Restrictions Precautions Precautions: None Restrictions Weight Bearing Restrictions: No              ADL either performed or assessed with clinical judgement   ADL         General ADL Comments: Patient reviewed ECT handout and problem solved through strategies with OT. patient endoresed using mindfulness techinquies on phone on youtube and netflix to help with relaxation. patient was able to teach back ECT strategies. patient was educated on  different ways to practice mindfulness patient verbalized and demosntrated understanding. sister in room later with reports that patient will have support in next level of care for ECT.      Cognition Arousal/Alertness: Awake/alert Behavior During Therapy: WFL for tasks assessed/performed Overall Cognitive Status: Within Functional Limits for tasks assessed                       Pertinent Vitals/ Pain       Pain Assessment Pain Assessment: Faces Faces Pain Scale: Hurts a little bit Pain Location: back; abdomen Pain Descriptors / Indicators: Discomfort Pain Intervention(s): Monitored during session         Frequency  Min 2X/week        Progress Toward Goals  OT Goals(current goals can now be found in the care plan section)  Progress towards OT goals: Progressing toward goals     Plan Discharge plan remains appropriate       AM-PAC OT "6 Clicks" Daily Activity     Outcome Measure   Help from another person eating meals?: None Help from another person taking care of personal grooming?: None Help from another person toileting, which includes using toliet, bedpan, or urinal?: None Help from another person bathing (including washing, rinsing, drying)?: A Little Help from another person to put on and taking off regular upper body clothing?: None Help from another person to put on and taking off regular lower body clothing?: A Little 6 Click Score: 22    End of Session    OT Visit Diagnosis: Unsteadiness on feet (R26.81);Muscle weakness (generalized) (M62.81);Pain   Activity Tolerance Patient tolerated treatment well   Patient Left in bed;with call bell/phone within reach;Other (comment) (MD  in room)   Nurse Communication          Time: [phone] OT Time Calculation (min): 17 min  Charges: OT General Charges $OT Visit: 1 Visit OT Treatments $Self Care/Home Management : 8-22 mins  Rennie Plowman, MS Acute Rehabilitation Department Office#  [phone]   Willa Rough 11/12/2022, 1:59 PM

## 2022-11-13 ENCOUNTER — Inpatient Hospital Stay (HOSPITAL_COMMUNITY): Payer: Medicaid Other

## 2022-11-13 DIAGNOSIS — R112 Nausea with vomiting, unspecified: Secondary | ICD-10-CM

## 2022-11-13 DIAGNOSIS — C786 Secondary malignant neoplasm of retroperitoneum and peritoneum: Secondary | ICD-10-CM | POA: Diagnosis not present

## 2022-11-13 DIAGNOSIS — F419 Anxiety disorder, unspecified: Secondary | ICD-10-CM

## 2022-11-13 LAB — GLUCOSE, CAPILLARY
Glucose-Capillary: 131 mg/dL — ABNORMAL HIGH (ref 70–99)
Glucose-Capillary: 146 mg/dL — ABNORMAL HIGH (ref 70–99)
Glucose-Capillary: 158 mg/dL — ABNORMAL HIGH (ref 70–99)
Glucose-Capillary: 191 mg/dL — ABNORMAL HIGH (ref 70–99)

## 2022-11-13 LAB — BASIC METABOLIC PANEL
Anion gap: 5 (ref 5–15)
BUN: 16 mg/dL (ref 6–20)
CO2: 27 mmol/L (ref 22–32)
Calcium: 7.6 mg/dL — ABNORMAL LOW (ref 8.9–10.3)
Chloride: 101 mmol/L (ref 98–111)
Creatinine, Ser: 0.46 mg/dL (ref 0.44–1.00)
GFR, Estimated: >60 mL/min (ref >60)
Glucose, Bld: 156 mg/dL — ABNORMAL HIGH (ref 70–99)
Potassium: 3.9 mmol/L (ref 3.5–5.1)
Sodium: 133 mmol/L — ABNORMAL LOW (ref 135–145)

## 2022-11-13 LAB — PHOSPHORUS: Phosphorus: 3.5 mg/dL (ref 2.5–4.6)

## 2022-11-13 LAB — MAGNESIUM: Magnesium: 1.9 mg/dL (ref 1.7–2.4)

## 2022-11-13 MED ORDER — FENTANYL 75 MCG/HR TD PT72
1.0000 | MEDICATED_PATCH | TRANSDERMAL | Status: DC
  Administered 2022-11-13 – 2022-11-16 (×2): 1 via TRANSDERMAL
  Filled 2022-11-13 (×2): qty 1

## 2022-11-13 MED ORDER — LIDOCAINE HCL 1 % IJ SOLN
INTRAMUSCULAR | Status: AC
  Filled 2022-11-13: qty 20

## 2022-11-13 MED ORDER — TRAVASOL 10 % IV SOLN
INTRAVENOUS | Status: AC
  Filled 2022-11-13: qty 856.8

## 2022-11-13 MED ORDER — ONDANSETRON HCL 4 MG/2ML IJ SOLN
4.0000 mg | Freq: Three times a day (TID) | INTRAMUSCULAR | Status: DC
  Administered 2022-11-13 – 2022-11-14 (×3): 4 mg via INTRAVENOUS
  Filled 2022-11-13 (×3): qty 2

## 2022-11-13 NOTE — Progress Notes (Signed)
Patient presents for therapeutic paracentesis. [place] limited abdomen shows small amount of peritoneal fluid noted  Insufficient to provide therapeutic value. Patient declined the procedure. Procedure not performed.

## 2022-11-13 NOTE — Progress Notes (Signed)
PROGRESS NOTE    Christina [name]  [id number] DOB: 1976-04-24 DOA: 10/19/2022 PCP: [name], MD     Brief Narrative:  Christina [name] is a 47 year old female with history of anxiety, HPV, who presented with complaint of abdominal distention, constipation.  CT abdomen/pelvis showed peritoneal carcinomatosis with extensive metastasis, ascites, multiloculated cystic lesion of the pancreatic tail.  GYN oncology consulted initially .  Underwent paracentesis x 3.  Tumor markers are elevated :CA125, CEA.  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  Oncology, GI following.  S/p  EGD, plan for EUS/biopsy as per GI but has been postponed.  Started  on TPN, PCA pump.  Hospital course complicated by persistent abdominal pain, distention, constipation, poor oral intake.  Palliative care also following for goals of care.  Oncology started chemotherapy.  New events last 24 hours / Subjective: Did not tolerate clear liquids this morning.  Having significant nausea, asking for nausea medication  Assessment & Plan:   Principal Problem:   Peritoneal carcinomatosis ([place]) Active Problems:   S/P cesarean section   Generalized abdominal pain   Constipation   Ovarian mass   Pancreatic mass   Tobacco abuse   Malignant ascites   Protein-calorie malnutrition (HCC)   Cancer associated pain   High risk medication use   Palliative care encounter   Counseling and coordination of care   Abnormal cervical Papanicolaou smear   Metastatic adenocarcinoma with peritoneal carcinomatosis -Concern of extensive metastatic disease with unknown primary as seen on the CT scan.  CT concerning for bilateral cystic/solid ovarian masses, pancreatic lesion also noted.  CEA, CA125 elevated, CA 19-9 is normal. 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   -GI consulted, status post EGD on  2/9. EGD was negative for malignancy in the esophagus, stomach or duodenum but was found to have significant extrinsic compression  in the gastric antrum. EUS was initially planned however it was canceled later due to abdominal distention. Now GI does not think EUS will be high yield procedure and its finding may not change patient's management.  EUS can be done later as an outpatient once her symptoms improved. -Underwent Port-A-Cath placement on 2/9. Oncology started chemotherapy. Next cycle due 2/28  -Endometrial biopsy showed metastatic adenocarcinoma.  Dr. Burr Medico following as well as GYN oncology.  Awaiting further molecular testing at this time   Pain management -Appreciate palliative care team management  Malignant ascites  -Underwent paracentesis on 2/3, 2/6,  2/10, 2/16, 2/23.   Continue supportive care.   -Continue spironolactone, IV Lasix.   -Ordered ultrasound guided paracentesis today  Chest pain -EKG reviewed independently which revealed normal sinus rhythm -Troponin negative -Resolved    Pulmonary embolism -Asymptomatic.  CT chest done with contrast for staging purpose showed small embolus on the proximal right lower lobe pulmonary artery.  Currently on full dose Lovenox.  Will change Eliquis when appropriate.  Pancytopenia -In setting of chemotherapy -Granix -Added Claritin.  Previously had reported spasms using Claritin and was added to allergy list.  Discussed with patient, patient would like to be removed from allergy list.  She has the spasms more with Benadryl use.   Microcytic anemia/iron deficiency/folic acid deficiency -Continue iron supplement, folic acid supplement. S/p 1 unit PRBC transfusion on 2/16   Moderate protein calorie malnutrition:  Hypoalbuminemia:  -Nutritionist consulted and following -Remains on TPN    Hypertension -Norvasc, losartan   DVT prophylaxis: Lovenox  Code Status: Full Family Communication: None at bedside  Disposition Plan:    Status is: Inpatient Remains inpatient appropriate because: remains on TPN.  Further oncology recommendations pending  Antimicrobials:  Anti-infectives (From admission, onward)    None        Objective: Vitals:   11/12/22 1149 11/12/22 1315 11/12/22 2206 11/13/22 0543  BP: (!) 122/91 (!) 131/96 118/80 130/86  Pulse: (!) 107 (!) 106 89 99  Resp:  20 17   Temp:  98.4 F (36.9 C) 98.3 F (36.8 C) 98.3 F (36.8 C)  TempSrc:  Oral Oral Oral  SpO2: 98% 96% 96% 92%  Weight:      Height:        Intake/Output Summary (Last 24 hours) at 11/13/2022 1039 Last data filed at 11/13/2022 0536 Gross per 24 hour  Intake 788.71 ml  Output 2550 ml  Net -1761.29 ml    Filed Weights   11/07/22 0808 11/08/22 0940 11/09/22 0500  Weight: 71 kg 70.3 kg 69.8 kg    Examination:  General exam: Appears calm, uncomfortable Respiratory system: Clear to auscultation. Respiratory effort normal. No respiratory distress. No conversational dyspnea.  Cardiovascular system: S1 & S2 heard, RRR. No murmurs. No pedal edema. Gastrointestinal system: Abdomen is soft, mildly distended  Central nervous system: Alert and oriented. No focal neurological deficits. Speech clear.  Extremities: Symmetric in appearance  Skin: No rashes, lesions or ulcers on exposed skin  Psychiatry: Judgement and insight appear normal. Mood & affect appropriate.   Data Reviewed: I have personally reviewed following labs and imaging studies  CBC: Recent Labs  Lab 11/08/22 0550 11/09/22 0413 11/10/22 0636 11/11/22 0500 11/12/22 0500  WBC 1.0* 1.0* 1.1* 3.2* 17.3*  NEUTROABS  --   --   --  1.2*  --   HGB 8.2* 8.0* 8.2* 7.9* 7.9*  HCT 26.7* 25.9* 27.1* 25.8* 25.7*  MCV 76.9* 76.4* 76.8* 77.2* 76.7*  PLT 165 199 230 231 [id number]    Basic Metabolic Panel: Recent Labs  Lab 11/08/22 0550 11/09/22 0413 11/10/22 0636 11/11/22 0500 11/12/22 0500 11/13/22 0500  NA 132* 135 130* 131* 133* 133*  K 3.7 3.6 3.7 3.6 4.1 3.9   CL 100 102 98 99 101 101  CO2 '25 24 25 25 25 27  '$ GLUCOSE 166* 161* 152* 155* 184* 156*  BUN 24* 23* 21* 22* 19 16  CREATININE 0.52 0.51 0.48 0.48 0.44 0.46  CALCIUM 7.9* 7.8* 7.5* 7.6* 7.8* 7.6*  MG 1.8  --  1.8 2.2 1.7 1.9  PHOS 3.4  --  3.0  --  2.9 3.5    GFR: Estimated Creatinine Clearance: 73 mL/min (by C-G formula based on SCr of 0.46 mg/dL). Liver Function Tests: Recent Labs  Lab 11/07/22 0640 11/08/22 0550 11/09/22 0413 11/12/22 0500  AST 16 14* 14* 28  ALT 58* 44 36 52*  ALKPHOS 102 99 93 87  BILITOT 0.1* 0.2* 0.2* 0.4  PROT 4.3* 4.8* 4.7* 4.5*  ALBUMIN 1.7* 1.7* 1.5* <1.5*    No results for input(s): "LIPASE", "AMYLASE" in the last 168 hours. No results for input(s): "AMMONIA" in the last 168 hours. Coagulation Profile: No results for input(s): "INR", "PROTIME" in the last 168 hours. Cardiac Enzymes: No results for input(s): "CKTOTAL", "CKMB", "CKMBINDEX", "TROPONINI" in the last 168 hours. BNP (last 3 results) No results for input(s): "PROBNP" in the last 8760 hours. HbA1C: No results for input(s): "HGBA1C" in the last 72 hours. CBG: Recent Labs  Lab 11/12/22 0555 11/12/22 1146  11/12/22 1753 11/12/22 2347 11/13/22 0541  GLUCAP 151* 163* 210* 134* 131*    Lipid Profile: Recent Labs    11/12/22 0500  TRIG 118    Thyroid Function Tests: No results for input(s): "TSH", "T4TOTAL", "FREET4", "T3FREE", "THYROIDAB" in the last 72 hours. Anemia Panel: No results for input(s): "VITAMINB12", "FOLATE", "FERRITIN", "TIBC", "IRON", "RETICCTPCT" in the last 72 hours. Sepsis Labs: No results for input(s): "PROCALCITON", "LATICACIDVEN" in the last 168 hours.  No results found for this or any previous visit (from the past 240 hour(s)).    Radiology Studies: No results found.    Scheduled Meds:  amLODipine  10 mg Oral Daily   Chlorhexidine Gluconate Cloth  6 each Topical Daily   clonazepam  0.25 mg Oral BID   dexamethasone (DECADRON) injection  4 mg  Intravenous Daily   enoxaparin (LOVENOX) injection  1 mg/kg Subcutaneous Q12H   fentaNYL  1 patch Transdermal Q72H   ferrous sulfate  325 mg Oral Q breakfast   folic acid  1 mg Oral Daily   furosemide  40 mg Intravenous Daily   insulin aspart  0-15 Units Subcutaneous Q6H   loratadine  10 mg Oral Daily   losartan  50 mg Oral Daily   OLANZapine zydis  5 mg Oral QHS   ondansetron (ZOFRAN) IV  4 mg Intravenous TID AC   pantoprazole (PROTONIX) IV  40 mg Intravenous Q24H   polyethylene glycol  17 g Oral Q8H   pregabalin  25 mg Oral QHS   senna-docusate  2 tablet Oral QHS   sodium chloride flush  10-40 mL Intracatheter Q12H   spironolactone  25 mg Oral Daily   Continuous Infusions:  TPN ADULT (ION) 70 mL/hr at 11/13/22 0536   TPN ADULT (ION)       LOS: 24 days   Time spent: 25 minutes   Dessa Phi, DO [facility] 11/13/2022, 10:39 AM   Available via Epic secure chat 7am-7pm After these hours, please refer to coverage provider listed on [email or web address]

## 2022-11-13 NOTE — Progress Notes (Signed)
Daily Progress Note   Patient Name: Christina [name]       Date: 11/13/2022 DOB: 1976-05-18  Age: 47 y.o. MRN#: [id number] Attending Physician: Dessa Phi, DO Primary Care Physician: [name], MD Admit Date: 10/19/2022 Length of Stay: 24 days  Reason for Consultation/Follow-up: Establishing goals of care and symptom management  Subjective:   CC: Patient notes Zofran and Compazine helping with nausea which she still has.  Following up regarding symptom management.  Increase fentanyl D/c flexeril Schedule Zofran AC EKG Qtc 11/08/22 419  Subjective:  Reviewed EMR prior to seeing patient.  In the past 24 hours patient has received IV Dilaudid 1 mg x 5 doses.  Patient was started on olanzapine 5 mg nightly on 11/12/2022.  Patient continues to receive fentanyl 50 mcg patch.  Presented to bedside to check on patient.  Patient remembers this provider from last week.  Inquired about patient's symptoms at this time.  Patient's most prominent symptom at this time is nausea though she is still having abdominal and back pain.  When inquiring about nausea management, patient does feel the Zofran and Compazine assist with management.  Patient notes that the nausea does occur when she is trying to eat meals though can occur randomly throughout the day as well.  We discussed trying to schedule Zofran before meals to see if this helps with management and patient agreeing with this idea.  When inquiring about pain, patient notes that again the pain is in her abdomen and in her back.  Patient feels that the 1 mg of IV Dilaudid for breakthrough does help though she is needing it she feels every 4 hours.  We discussed that based on her current requirements, would consider increasing her fentanyl patch to the next dose to hopefully decrease her as needed needs.  Patient agreeing with increasing fentanyl patch at this time. Also discussed we will discontinue Flexeril as patient is not taking medication and can  interact with other medications she is currently receiving.  Patient notes that ultrasound for possible paracentesis is planned today to make sure increased fluid is not affecting her nausea.  Patient's last small bowel movement was this morning.  Patient notes plan is for her to have another round of chemotherapy tomorrow.  Offered emotional support via active listening.  Patient notes that she is missing her 3 children ages 65, 9, and 2 years old.  She notes that they are currently with their father.  She does talk to them regularly knows about projects they are currently working on for school.  All questions answered at that time.  Noted we will continue to follow along and adjust symptom management medications moving forward.  Review of Systems Continued abdominal and back pain, continued nausea Objective:   Vital Signs:  BP 130/86 (BP Location: Left Arm)   Pulse 99   Temp 98.3 F (36.8 C) (Oral)   Resp 17   Ht '4\' 11"'$  (1.499 m)   Wt 69.8 kg   LMP 10/12/2022   SpO2 92%   BMI 31.08 kg/m   Physical Exam: General: NAD, alert, pleasant, laying in bed Eyes: No drainage noted HENT: moist mucous membranes Cardiovascular: RRR Respiratory: no increased work of breathing noted, not in respiratory distress Abdomen: distended Extremities: Moving all appropriately Skin: no rashes or lesions on visible skin Neuro: A&Ox4, following commands easily Psych: appropriately answers all questions  Imaging:  I personally reviewed recent imaging.   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  Palliative medicine team consulted to assist with symptom management.    Recommendations/Plan: # Complex medical decision making/goals of care:  -Continuing with full scope of care at this time. Patient will need to follow up with outpatient PMT at [facility] to hopefully continue supporting goals for medical care moving forward    -If patient to remain in hospital for prolonged time, would consider inquiring about ACP documentation completion.   -  Code Status: Full Code  # Symptom management:    - Pain, Acute on chronic in the setting of malignant ascites with metastatic adenocarcinoma to peritoneum with large cystic lesion in pancreatic tail and bilateral ovaries                - Increase Fentanyl patch to 75 mcg Q 72 hours.                 - Continue hydromorphone IV '1mg'$  q4hrs prn  - Continue hydromorphone po 2-'4mg'$  q3 hrs prn                -Continue dexamethasone '4mg'$  daily at this time. Will continue IV for now due to ongoing vomiting.           -Discontinued flexeril as patient not requiring, does not improve her type of pain, and can interact with multiple medications patient receiving.    Nausea/Vomiting, acute in setting of malignant ascites, receiving chemotherapy  EKG on 11/08/22 noted Qtc of 419.   -Re-trialing scheduled IV Zofran with meals as patient does feel Zofran improves her symptoms    -Continue compazine prn   -Continue olanzapine '5mg'$  qhs (started 2/26)                  Anxiety, in setting of recent cancer diagnosis                -Continue Klonopin 0.'25mg'$  BID                  -Constipation, acute on chronic in setting of malignant ascites   -Has scheduled Miralax as per GI  # Psychosocial Support:  -Sister, 3 children (58,8,14 yo)- consider [facility] support   # Discharge Planning: TBD   -Will need follow up with PMT at [facility]. Please reach out to provider Jobe Gibbon NP at time of discharge to inform of this as will need close follow up.    Discussed with: patient, bedside RN  Thank you for allowing the palliative care team to participate in the care [name].  Chelsea Aus, DO Palliative Care Provider PMT # [phone]  If patient remains symptomatic despite maximum doses, please call PMT at [phone] between 0700 and 1900. Outside of these hours, please call attending, as PMT does not have night coverage.

## 2022-11-13 NOTE — Progress Notes (Signed)
PHARMACY - TOTAL PARENTERAL NUTRITION CONSULT NOTE   Indication:  gastric outlet obstruction with extrinsic compression   Patient Measurements: Height: '4\' 11"'$  (149.9 cm) Weight: 69.8 kg (153 lb 14.1 oz) IBW/kg (Calculated) : 43.2 TPN AdjBW (KG): 49.8 Body mass index is 31.08 kg/m. Usual Weight: 170 lbs  Assessment: 40 yoF with newly dx metastatic adenocarcinoma to peritoneum, with large cystic lesion in pancreatic tail, and bilateral ovaries. Malignant ascites s/p paracentesis x 5. EGD: gastric outlet obstruction w/ extrinsic compression. Pharmacy consulted to manage TPN for nutrition support.   Glucose / Insulin: no hx DM. CBGs 130-210s (goal < 180) - SSI / 24 hrs: 12 units - Insulin in TPN: 30 units - Dexamethasone '10mg'$  IV x1 on 2/14, then (start 2/16) '4mg'$  IV daily Electrolytes:  lytes stable and wnl except Na at 133 (max concentration in TPN). Renal: SCr stable Hepatic: LFTs mostly stable, ALT up to 52. Tbili ok. Triglycerides: stable Intake / Output; MIVF: ?unsure accuracy of I/O documentation  - 4 unmeasured urine and 3 stool occurrences - No IVF. Lasix '40mg'$  IV daily. Spironolactone '25mg'$  PO daily.    GI Imaging:  2/11 CT abd/pelvis: Multiple dilated loops of small bowel have developed which may represent ileus versus partial or developing small bowel obstruction. Numerous thickened, hyperenhancing loops of distal small bowel, suggestive of infectious or inflammatory enteritis. Abnormal wall thickening of the gastric antrum, which could be secondary to gastritis or malignancy. Large bilateral multi-cystic adnexal masses concerning for primary or metastatic ovarian malignancy. Multiloculated cystic lesion of the pancreatic tail. 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 2/12 abdominal X-ray: Persistent moderate gas distension of large and small bowel suggestive of ileus vs. partial obstruction  GI Surgeries / Procedures:  2/9 EGD: gastric outlet obstruction  with extrinsic compression. s/p Paracentesis x 5. Last paracentesis 2/23 yielded 3.1 L.  Central access:  - 2/9: single lumen PAC for chemo - 2/12: PICC for TPN  TPN start date: 2/12  Nutritional Goals: Goal TPN rate is 42m/hr (provides 86 g of protein and 1612 kcals per day)  RD Assessment:  Estimated Needs Total Energy Estimated Needs: 1500-1700 Total Protein Estimated Needs: 75-90g Total Fluid Estimated Needs: 1.5L/day  Current Nutrition:  TPN Diet: changed back to clears on 2/27; not tolerating full liquids    Plan:  At 1800: Continue TPN at goal rate of 70 mL/hr.  Electrolytes in TPN:  Na 154 mEq/L (maxed) K 50 mEq/L Ca 566m/L Mg 4 mEq/L Phos 10 mmol/L Cl:Ac ratio  2:1 Continue Insulin 30 units in bag Add standard MVI and trace elements to TPN Continue Moderate SSI q6h Monitor TPN labs on Mon/Thurs and PRN.   AmMendel RyderPharmD Clinical Pharmacist 11/13/2022 7:07 AM

## 2022-11-13 NOTE — Progress Notes (Signed)
Palliative Progress Note:  Presented to bedside for follow-up visit to check on patient's nausea. Christina [name] observed sitting on BSC. Assisted with helping patient wash and get back into bed. Patient reports that diarrhea has been problematic - but much improved since Reglan discontinued - and she is grateful for this.  Last episode of nausea was this morning. Patient reports that early on she needed PRN Compazine. She received first scheduled dose of Zofran at lunchtime and napped afterwards. She reports having also tried to space her pills throughout the day. Patient is preparing for supper now - and states she will see how it goes. Patient reports that her food options are limited - very little agrees with her stomach - she plans to try broth and ice pop. IV team nurse at bedside starting TPN.  Patient has concern about a blister on her right index finger. She states that this has happened with multiple blood glucose checks, some obtained while she is sleeping. Patient is provided with bandaids to cover the finger (to prevent additional sticks to that finger) and nursing aide notified to avoid this finger.  Thank you for allowing the palliative team to participate in the care of Christina [name]. If patient remains symptomatic despite maximum doses, please call PMT at [phone] between 0700 and 1900. Outside of these hours, please call attending, as PMT does not have night coverage.  Signed by: Moss Mc, RN, MSN, [facility] / NP Student   I have reviewed, seen, examined, and discussed the care of this patient in detail with the Moss Mc, RN, MSN, [facility] / NP Student  including pertinent patient records, physical exam findings, and data. I have updated the note accordingly and agree with details of the encounter stated in above note (GC).   Chelsea Aus, DO Palliative Care Provider [phone]

## 2022-11-14 DIAGNOSIS — Z79899 Other long term (current) drug therapy: Secondary | ICD-10-CM

## 2022-11-14 DIAGNOSIS — C786 Secondary malignant neoplasm of retroperitoneum and peritoneum: Secondary | ICD-10-CM | POA: Diagnosis not present

## 2022-11-14 LAB — CBC WITH DIFFERENTIAL/PLATELET
Abs Immature Granulocytes: 2.2 10*3/uL — ABNORMAL HIGH (ref 0.00–0.07)
Basophils Absolute: 0 10*3/uL (ref 0.0–0.1)
Basophils Relative: 0 %
Eosinophils Absolute: 0 10*3/uL (ref 0.0–0.5)
Eosinophils Relative: 0 %
HCT: 26.9 % — ABNORMAL LOW (ref 36.0–46.0)
Hemoglobin: 8.3 g/dL — ABNORMAL LOW (ref 12.0–15.0)
Lymphocytes Relative: 13 %
Lymphs Abs: 2.4 10*3/uL (ref 0.7–4.0)
MCH: 24.1 pg — ABNORMAL LOW (ref 26.0–34.0)
MCHC: 30.9 g/dL (ref 30.0–36.0)
MCV: 78.2 fL — ABNORMAL LOW (ref 80.0–100.0)
Metamyelocytes Relative: 2 %
Monocytes Absolute: 0.5 10*3/uL (ref 0.1–1.0)
Monocytes Relative: 3 %
Myelocytes: 9 %
Neutro Abs: 13.1 10*3/uL — ABNORMAL HIGH (ref 1.7–7.7)
Neutrophils Relative %: 72 %
Platelets: 346 10*3/uL (ref 150–400)
Promyelocytes Relative: 1 %
RBC: 3.44 MIL/uL — ABNORMAL LOW (ref 3.87–5.11)
RDW: 20.6 % — ABNORMAL HIGH (ref 11.5–15.5)
WBC: 18.2 10*3/uL — ABNORMAL HIGH (ref 4.0–10.5)
nRBC: 1.8 % — ABNORMAL HIGH (ref 0.0–0.2)

## 2022-11-14 LAB — GLUCOSE, CAPILLARY
Glucose-Capillary: 110 mg/dL — ABNORMAL HIGH (ref 70–99)
Glucose-Capillary: 159 mg/dL — ABNORMAL HIGH (ref 70–99)
Glucose-Capillary: 209 mg/dL — ABNORMAL HIGH (ref 70–99)
Glucose-Capillary: 212 mg/dL — ABNORMAL HIGH (ref 70–99)

## 2022-11-14 MED ORDER — FLUOROURACIL CHEMO INJECTION 5 GM/100ML
2400.0000 mg/m2 | Freq: Once | INTRAVENOUS | Status: AC
  Administered 2022-11-14: 4050 mg via INTRAVENOUS
  Filled 2022-11-14: qty 80

## 2022-11-14 MED ORDER — SODIUM CHLORIDE 0.9 % IV SOLN
10.0000 mg | Freq: Once | INTRAVENOUS | Status: AC
  Administered 2022-11-14: 10 mg via INTRAVENOUS
  Filled 2022-11-14: qty 1

## 2022-11-14 MED ORDER — SODIUM CHLORIDE 0.9 % IV SOLN
150.0000 mg | Freq: Once | INTRAVENOUS | Status: AC
  Administered 2022-11-14: 150 mg via INTRAVENOUS
  Filled 2022-11-14: qty 5

## 2022-11-14 MED ORDER — POLYETHYLENE GLYCOL 3350 17 G PO PACK
17.0000 g | PACK | Freq: Every day | ORAL | Status: DC
  Filled 2022-11-14: qty 1

## 2022-11-14 MED ORDER — PROCHLORPERAZINE EDISYLATE 10 MG/2ML IJ SOLN
10.0000 mg | Freq: Four times a day (QID) | INTRAMUSCULAR | Status: DC | PRN
  Administered 2022-11-14 – 2022-11-17 (×3): 10 mg via INTRAVENOUS
  Filled 2022-11-14 (×3): qty 2

## 2022-11-14 MED ORDER — SODIUM CHLORIDE 0.9 % IV SOLN
400.0000 mg/m2 | Freq: Once | INTRAVENOUS | Status: AC
  Administered 2022-11-14: 672 mg via INTRAVENOUS
  Filled 2022-11-14: qty 33.6

## 2022-11-14 MED ORDER — PALONOSETRON HCL INJECTION 0.25 MG/5ML
0.2500 mg | Freq: Once | INTRAVENOUS | Status: AC
  Administered 2022-11-14: 0.25 mg via INTRAVENOUS
  Filled 2022-11-14: qty 5

## 2022-11-14 MED ORDER — TRAVASOL 10 % IV SOLN
INTRAVENOUS | Status: AC
  Filled 2022-11-14: qty 856.8

## 2022-11-14 MED ORDER — OXALIPLATIN CHEMO INJECTION 100 MG/20ML
85.0000 mg/m2 | Freq: Once | INTRAVENOUS | Status: AC
  Administered 2022-11-14: 150 mg via INTRAVENOUS
  Filled 2022-11-14: qty 20

## 2022-11-14 MED ORDER — SODIUM CHLORIDE 0.9 % IV SOLN
150.0000 mg/m2 | Freq: Once | INTRAVENOUS | Status: AC
  Administered 2022-11-14: 260 mg via INTRAVENOUS
  Filled 2022-11-14: qty 13

## 2022-11-14 MED ORDER — DEXTROSE 5 % IV SOLN: Freq: Once | INTRAVENOUS | Status: AC

## 2022-11-14 NOTE — Progress Notes (Signed)
PROGRESS NOTE    Christina [name]  [id number] DOB: 1976-01-27 DOA: 10/19/2022 PCP: [name], MD     Brief Narrative:  Christina [name] is a 47 year old female with history of anxiety, HPV, who presented with complaint of abdominal distention, constipation.  CT abdomen/pelvis showed peritoneal carcinomatosis with extensive metastasis, ascites, multiloculated cystic lesion of the pancreatic tail.  GYN oncology consulted initially .  Underwent paracentesis x 3.  Tumor markers are elevated :CA125, CEA.  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  Oncology, GI following.  S/p  EGD, plan for EUS/biopsy as per GI but has been postponed.  Started  on TPN, PCA pump.  Hospital course complicated by persistent abdominal pain, distention, constipation, poor oral intake.  Palliative care also following for goals of care.  Oncology started chemotherapy.  New events last 24 hours / Subjective: Patient was seen and examined.  Occasional nausea but tolerating clear liquids.  She wants to try some more food today.  Due for chemotherapy.  Pain is managed with fentanyl patch and oral pain medications.  Assessment & Plan:   Principal Problem:   Peritoneal carcinomatosis ([place]) Active Problems:   S/P cesarean section   Generalized abdominal pain   Constipation   Ovarian mass   Pancreatic mass   Tobacco abuse   Malignant ascites   Protein-calorie malnutrition ([facility])   Cancer associated pain   High risk medication use   Palliative care encounter   Counseling and coordination of care   Abnormal cervical Papanicolaou smear   Nausea and vomiting   Anxiety   Medication management   Metastatic adenocarcinoma with peritoneal carcinomatosis -Concern of extensive metastatic disease with unknown primary as seen on the CT scan.  CT concerning for bilateral cystic/solid ovarian masses, pancreatic lesion also noted.  CEA, CA125 elevated, CA  19-9 is normal. 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   -GI consulted, status post EGD on 2/9. EGD was negative for malignancy in the esophagus, stomach or duodenum but was found to have significant extrinsic compression  in the gastric antrum. EUS was initially planned however it was canceled later due to abdominal distention. Now GI does not think EUS will be high yield procedure and its finding may not change patient's management.  EUS can be done later as an outpatient once her symptoms improved. -Underwent Port-A-Cath placement on 2/9. Oncology started chemotherapy. Next cycle due today. -Endometrial biopsy showed metastatic adenocarcinoma.  Dr. Burr Medico following as well as GYN oncology.  Awaiting further molecular testing at this time   Pain management -Appreciate palliative care team management.  Currently on fentanyl and oxycodone.  Malignant ascites  -Underwent paracentesis on 2/3, 2/6,  2/10, 2/16, 2/23.   Continue supportive care.   -Continue spironolactone, IV Lasix.   -Paracentesis 2/27, no enough fluid.  Pulmonary embolism -Asymptomatic.  CT chest done with contrast for staging purpose showed small embolus on the proximal right lower lobe pulmonary artery.  Currently on full dose Lovenox.  Will change Eliquis when appropriate.  Pancytopenia -In setting of chemotherapy -Adequate now.  Patient received Granix.   Microcytic anemia/iron deficiency/folic acid deficiency -Continue iron supplement, folic acid supplement. S/p 1 unit PRBC transfusion on 2/16   Moderate protein calorie malnutrition:  Hypoalbuminemia:  -Nutritionist consulted and following -Remains on TPN -Interested in increasing diet today.  Hopefully she can come off TPN before discharge.    Hypertension -Norvasc, losartan  DVT prophylaxis: Lovenox  Code Status: Full Family Communication: None at bedside  Disposition Plan:   Status  is: Inpatient Remains inpatient appropriate because: remains on TPN.  Further oncology recommendations pending  Antimicrobials:  Anti-infectives (From admission, onward)    None        Objective: Vitals:   11/13/22 1325 11/13/22 2112 11/14/22 0607 11/14/22 0701  BP: 118/81 (!) 140/92 131/86   Pulse: 94 98 100   Resp: '18 18 16   '$ Temp: 98.7 F (37.1 C) 97.8 F (36.6 C) 98.1 F (36.7 C)   TempSrc: Oral Oral Oral   SpO2: 93% 97% 99%   Weight:    69.8 kg  Height:        Intake/Output Summary (Last 24 hours) at 11/14/2022 1253 Last data filed at 11/14/2022 1135 Gross per 24 hour  Intake 1362.11 ml  Output --  Net 1362.11 ml    Filed Weights   11/08/22 0940 11/09/22 0500 11/14/22 0701  Weight: 70.3 kg 69.8 kg 69.8 kg    Examination:  General exam: Appears calm, uncomfortable Frail, debilitated.  Not in any distress. Respiratory system: Clear to auscultation. She looks quite comfortable.  CBC: Recent Labs  Lab 11/09/22 0413 11/10/22 0636 11/11/22 0500 11/12/22 0500 11/14/22 0816  WBC 1.0* 1.1* 3.2* 17.3* 18.2*  NEUTROABS  --   --  1.2*  --  13.1*  HGB 8.0* 8.2* 7.9* 7.9* 8.3*  HCT 25.9* 27.1* 25.8* 25.7* 26.9*  MCV 76.4* 76.8* 77.2* 76.7* 78.2*  PLT 199 230 231 340 [id number]    Basic Metabolic Panel: Recent Labs  Lab 11/08/22 0550 11/09/22 0413 11/10/22 0636 11/11/22 0500 11/12/22 0500 11/13/22 0500  NA 132* 135 130* 131* 133* 133*  K 3.7 3.6 3.7 3.6 4.1 3.9  CL 100 102 98 99 101 101  CO2 '25 24 25 25 25 27  '$ GLUCOSE 166* 161* 152* 155* 184* 156*  BUN 24* 23* 21* 22* 19 16  CREATININE 0.52 0.51 0.48 0.48 0.44 0.46  CALCIUM 7.9* 7.8* 7.5* 7.6* 7.8* 7.6*  MG 1.8  --  1.8 2.2 1.7 1.9  PHOS 3.4  --  3.0  --  2.9 3.5    GFR: Estimated Creatinine Clearance: 73 mL/min (by C-G formula based on SCr of 0.46 mg/dL). Liver Function Tests: Recent Labs  Lab 11/08/22 0550 11/09/22 0413 11/12/22 0500  AST 14* 14* 28  ALT 44 36 52*  ALKPHOS 99 93 87   BILITOT 0.2* 0.2* 0.4  PROT 4.8* 4.7* 4.5*  ALBUMIN 1.7* 1.5* <1.5*    No results for input(s): "LIPASE", "AMYLASE" in the last 168 hours. No results for input(s): "AMMONIA" in the last 168 hours. Coagulation Profile: No results for input(s): "INR", "PROTIME" in the last 168 hours. Cardiac Enzymes: No results for input(s): "CKTOTAL", "CKMB", "CKMBINDEX", "TROPONINI" in the last 168 hours. BNP (last 3 results) No results for input(s): "PROBNP" in the last 8760 hours. HbA1C: No results for input(s): "HGBA1C" in the last 72 hours. CBG: Recent Labs  Lab 11/13/22 1153 11/13/22 1743 11/13/22 2349 11/14/22 0525 11/14/22 1214  GLUCAP 158* 191* 146* 110* 159*    Lipid Profile: Recent Labs    11/12/22 0500  TRIG 118    Thyroid Function Tests: No results for input(s): "TSH", "T4TOTAL", "FREET4", "T3FREE", "THYROIDAB" in the last 72 hours. Anemia Panel: No results for input(s): "VITAMINB12", "FOLATE", "FERRITIN", "TIBC", "IRON", "RETICCTPCT" in the last 72 hours. Sepsis Labs: No results for input(s): "PROCALCITON", "LATICACIDVEN" in the last 168 hours.  No results found for this or any previous visit (from the past 240 hour(s)).    Radiology Studies: [place] ASCITES (ABDOMEN LIMITED)  Result Date: 11/13/2022 CLINICAL DATA:  224137 Malignant ascites 224137 EXAM: LIMITED ABDOMEN ULTRASOUND FOR ASCITES TECHNIQUE: Limited ultrasound survey for ascites was performed in all four abdominal quadrants. COMPARISON:  IR ultrasound paracentesis, most recently 11/09/2022. CT AP, 10/28/2022. FINDINGS: Focused ultrasound at the area of concern, along the abdominal quadrants for ascites. Small volume of intra-abdominal free fluid without a significant pocket for safe paracentesis. Procedure was aborted. Performed by Rushie Nyhan, IR NP IMPRESSION: Small volume of abdominal ascites. Planned ultrasound paracentesis was deferred. Michaelle Birks, MD [facility]  [facility] Electronically Signed   By: Michaelle Birks M.D.   On: 11/13/2022 14:50      Scheduled Meds:  amLODipine  10 mg Oral Daily   Chlorhexidine Gluconate Cloth  6 each Topical Daily   clonazepam  0.25 mg Oral BID   dexamethasone (DECADRON) injection  4 mg Intravenous Daily   enoxaparin (LOVENOX) injection  1 mg/kg Subcutaneous Q12H   fentaNYL  1 patch Transdermal Q72H   ferrous sulfate  325 mg Oral Q breakfast   fluorouracil (ADRUCIL) 4,050 mg in dextrose 5 % 1,000 mL chemo infusion  2,400 mg/m2 (Treatment Plan Recorded) Intravenous Once   folic acid  1 mg Oral Daily   furosemide  40 mg Intravenous Daily   insulin aspart  0-15 Units Subcutaneous Q6H   irinotecan (CAMPTOSAR) 260 mg in sodium chloride 0.9 % 500 mL chemo infusion  150 mg/m2 (Treatment Plan Recorded) Intravenous Once   leucovorin 672 mg in sodium chloride 0.9 % 250 mL infusion  400 mg/m2 (Treatment Plan Recorded) Intravenous Once   loratadine  10 mg Oral Daily   losartan  50 mg Oral Daily   OLANZapine zydis  5 mg Oral QHS   oxaliplatin (ELOXATIN) 150 mg in dextrose 5 % 500 mL chemo infusion  85 mg/m2 (Treatment Plan Recorded) Intravenous Once   pantoprazole (PROTONIX) IV  40 mg Intravenous Q24H   [START ON 11/15/2022] polyethylene glycol  17 g Oral Daily   pregabalin  25 mg Oral QHS   senna-docusate  2 tablet Oral QHS   sodium chloride flush  10-40 mL Intracatheter Q12H   spironolactone  25 mg Oral Daily   Continuous Infusions:  TPN ADULT (ION) 70 mL/hr at 11/14/22 1135   TPN ADULT (ION)       LOS: 25 days   Time spent:  35 minutes

## 2022-11-14 NOTE — Progress Notes (Signed)
PHARMACY - TOTAL PARENTERAL NUTRITION CONSULT NOTE   Indication:  gastric outlet obstruction with extrinsic compression   Patient Measurements: Height: '4\' 11"'$  (149.9 cm) Weight: 69.8 kg (153 lb 14.1 oz) IBW/kg (Calculated) : 43.2 TPN AdjBW (KG): 49.8 Body mass index is 31.08 kg/m. Usual Weight: 170 lbs  Assessment: 66 yoF with newly dx metastatic adenocarcinoma to peritoneum, with large cystic lesion in pancreatic tail, and bilateral ovaries. Malignant ascites s/p paracentesis x 5. EGD: gastric outlet obstruction w/ extrinsic compression. Pharmacy consulted to manage TPN for nutrition support.   Glucose / Insulin: no hx DM. CBGs 110-191 (goal < 180) - SSI / 24 hrs: 8 units - Insulin in TPN: 30 units (inc from 20 units on 2/26) - Dexamethasone '10mg'$  IV 2/14, Dex '4mg'$  IV daily 2/16 >>  Electrolytes:  lytes stable and wnl except Na at 133 (max concentration in TPN). Renal: SCr stable Hepatic: LFTs mostly stable, ALT up to 52. Tbili WNL Triglycerides: 118 (2/26) Intake / Output; MIVF: I/O not measured: Urine x3, stool x2 - No IVF. Lasix '40mg'$  IV daily. Spironolactone '25mg'$  PO daily.    GI Imaging:  2/11 CT abd/pelvis: Multiple dilated loops of small bowel have developed which may represent ileus versus partial or developing small bowel obstruction. Numerous thickened, hyperenhancing loops of distal small bowel, suggestive of infectious or inflammatory enteritis. Abnormal wall thickening of the gastric antrum, which could be secondary to gastritis or malignancy. Large bilateral multi-cystic adnexal masses concerning for primary or metastatic ovarian malignancy. Multiloculated cystic lesion of the pancreatic tail. 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 2/12 abdominal X-ray: Persistent moderate gas distension of large and small bowel suggestive of ileus vs. partial obstruction  GI Surgeries / Procedures:  2/9 EGD: gastric outlet obstruction with extrinsic  compression. s/p Paracentesis x 5. Last paracentesis 2/23 yielded 3.1 L.  Central access:  - 2/9: single lumen PAC for chemo - 2/12: PICC for TPN  TPN start date: 2/12  Nutritional Goals: Goal TPN rate is 17m/hr (provides 86 g of protein and 1612 kcals per day)  RD Assessment:  Estimated Needs Total Energy Estimated Needs: 1500-1700 Total Protein Estimated Needs: 75-90g Total Fluid Estimated Needs: 1.5L/day  Current Nutrition:  TPN Diet: changed back to clears on 2/27; not tolerating full liquids    Plan:  At 1800: Continue TPN at goal rate of 70 mL/hr.  Electrolytes in TPN:  Na 154 mEq/L (maxed) K 50 mEq/L Ca 550m/L Mg 4 mEq/L Phos 10 mmol/L Cl:Ac ratio  2:1 Decrease to Insulin 25 units in bag Add standard MVI and trace elements to TPN Continue Moderate SSI q6h Monitor TPN labs on Mon/Thurs and PRN.    ChGretta ArabharmD, BCPS [facility] main pharmacy [phone]/28/2024 7:21 AM

## 2022-11-14 NOTE — Progress Notes (Signed)
Daily Progress Note   Patient Name: Christina [name]       Date: 11/14/2022 DOB: Apr 17, 1976  Age: 47 y.o. MRN#: [id number] Attending Physician: Barb Merino, MD Primary Care Physician: [name], MD Admit Date: 10/19/2022 Length of Stay: 25 days  Reason for Consultation/Follow-up: Establishing goals of care and symptom management  Subjective:   CC: Patient notes scheduled Zofran yesterday helped with nausea, feels more controlled at this time.  Following up regarding symptom management.  Subjective:  Reviewed EMR prior to seeing patient.  In the past 24 hours patient has received IV Dilaudid 1 mg x 5 doses.  Patient was started on olanzapine 5 mg nightly on 11/12/2022.  Patient's fentanyl patch was increased to 56mg on 11/13/22.  Presented to bedside to check on patient.  Patient seen laying comfortably in bed. Patient noted nausea improved yesterday when Zofran was scheduled before meals. Zofran has been discontinued at this time by Dr. FBurr Medicoas patient will be receiving Aloxi and so not to received Zofran for 3 days. Patient can continued to receive compazine.   Patient feels that her overall pain is improving as well with increase in fentanyl patch. Patient notes she was able to sleep longer and more comfortably last night. Patient continues to need IV dilaudid for breakthrough pain though hopefully this will continue to lessen with increased length of time on the patch to get to maximum steady state.   Patient's primary concern at this time is diarrhea. Patient notes having 3-4 watery bowel movements a day and notes difficulty with control of bowl movements. Trying to balance diarrhea with constipation as patient's primary concern on admission was constipation leading to scheduling of Miralax by GI 3 times a day.   Patient planning to receive chemotherapy today. Noted PMT provider would continue to follow along for symptom management.   Review of Systems Improving nausea and pain  control, diarrhea present Objective:   Vital Signs:  BP 131/86 (BP Location: Left Arm)   Pulse 100   Temp 98.1 F (36.7 C) (Oral)   Resp 16   Ht '4\' 11"'$  (1.499 m)   Wt 69.8 kg   LMP 10/12/2022   SpO2 99%   BMI 31.08 kg/m   Physical Exam: General: NAD, alert, pleasant, laying in bed Eyes: No drainage noted HENT: moist mucous membranes Cardiovascular: RRR Respiratory: no increased work of breathing noted, not in respiratory distress Abdomen: distended Extremities: Moving all appropriately Skin: no rashes or lesions on visible skin Neuro: A&Ox4, following commands easily Psych: appropriately answers all questions  Imaging:  I personally reviewed recent imaging.   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  Palliative medicine team consulted to assist with symptom management.    Recommendations/Plan: # Complex medical decision making/goals of care:  -Continuing with full scope of care at this time. [facility] for [facility] care moving [facility]    -If patient to remain in hospital for prolonged time, would consider inquiring about ACP documentation completion.   -  Code Status: Full Code  # Symptom management:    -  Pain, Acute on chronic in the setting of malignant ascites with metastatic adenocarcinoma to peritoneum with large cystic lesion in pancreatic tail and bilateral ovaries                -Continue  Fentanyl patch to 75 mcg Q 72 hours.                 -Continue hydromorphone IV  '1mg'$  q4hrs prn  -Continue hydromorphone po 2-'4mg'$  q3 hrs prn                -Continue dexamethasone '4mg'$  daily at this time. Getting '10mg'$  IV dose with chemotherapy today. Will need to eventually consider taper based on patient's symptoms and plans regarding chemotherapy.           -Discontinued flexeril as patient not requiring, does not improve her type of pain, and can interact with multiple medications patient receiving. Please do not restart.    Nausea/Vomiting, acute in setting of malignant ascites, receiving chemotherapy  EKG on 11/08/22 noted Qtc of 419.   -Patient felt scheduled Zofran before meals on 2/27 was improving nausea. Zofran discontinued at this time by Dr. Burr Medico as will be receiving Aloxi and Emend.    -Continue compazine prn   -Continue olanzapine '5mg'$  qhs (started 2/26)                  Anxiety, in setting of recent cancer diagnosis                -Continue Klonopin 0.'25mg'$  BID                  -Diarrhea   Patient initially had constipation on admission in setting of metastatic adenocarcinoma to peritoneum with malignant ascites. Was initially on Miralax 17gm scheduled 3 times daily by GI.   -Decreased scheduled Miralax to 17gm once daily.    -Could consider addition of Loperamide 2gm BID prn though would need to monitor very closely as do not want to return patient to constipated state. This may need to be adjusted since receiving chemotherapy today.   # Psychosocial Support:  -Sister, 3 children (34,8,14 yo)- consider [facility] support   # Discharge Planning: TBD   -Will need follow up with [facility] at [facility]. Please reach out to provider Jobe Gibbon NP at time of discharge to inform of this as will need close follow up.   Discussed with: patient, bedside RN  Thank you for allowing the palliative care team to participate in the care [name].  Chelsea Aus, DO Palliative Care Provider PMT # [phone]  If patient remains symptomatic despite maximum doses,  please call PMT at [phone] between 0700 and 1900. Outside of these hours, please call attending, as PMT does not have night coverage.

## 2022-11-14 NOTE — Progress Notes (Signed)
Christina [name]   DOB:1975/12/02   [id number]   [id number]  Med/onc follow up note  Subjective: Patient is on clear liquid diet now, nausea is better controlled with Zofran around-the-clock.  No vomiting.  She has been having watery bowel movement 3-4 times a day in the past few days, abdominal bloating is tolerable, other new complaints.   Vitals:   11/13/22 2112 11/14/22 0607  BP: (!) 140/92 131/86  Pulse: 98 100  Resp: 18 16  Temp: 97.8 F (36.6 C) 98.1 F (36.7 C)  SpO2: 97% 99%    Body mass index is 31.08 kg/m.  Intake/Output Summary (Last 24 hours) at 11/14/2022 0923 Last data filed at 11/14/2022 [id number] Gross per 24 hour  Intake 975.24 ml  Output --  Net 975.24 ml     Sclerae unicteric  Oropharynx clear  No peripheral adenopathy  Lungs clear -- no rales or rhonchi  Heart regular rate and rhythm  Abdomen distended with mild diffuse tenderness.  MSK no focal spinal tenderness, no peripheral edema  Neuro nonfocal    CBG (last 3)  Recent Labs    11/13/22 1743 11/13/22 2349 11/14/22 0525  GLUCAP 191* 146* 110*     Labs:   Urine Studies No results for input(s): "UHGB", "CRYS" in the last 72 hours.  Invalid input(s): "UACOL", "UAPR", "USPG", "UPH", "UTP", "UGL", "UKET", "UBIL", "UNIT", "UROB", "ULEU", "UEPI", "UWBC", "URBC", "UBAC", "CAST", "UCOM", "BILUA"  Basic Metabolic Panel: Recent Labs  Lab 11/08/22 0550 11/09/22 0413 11/10/22 0636 11/11/22 0500 11/12/22 0500 11/13/22 0500  NA 132* 135 130* 131* 133* 133*  K 3.7 3.6 3.7 3.6 4.1 3.9  CL 100 102 98 99 101 101  CO2 '25 24 25 25 25 27  '$ GLUCOSE 166* 161* 152* 155* 184* 156*  BUN 24* 23* 21* 22* 19 16  CREATININE 0.52 0.51 0.48 0.48 0.44 0.46  CALCIUM 7.9* 7.8* 7.5* 7.6* 7.8* 7.6*  MG 1.8  --  1.8 2.2 1.7 1.9  PHOS 3.4  --  3.0  --  2.9 3.5   GFR Estimated Creatinine Clearance: 73 mL/min (by C-G formula based on SCr of 0.46 mg/dL). Liver Function Tests: Recent Labs  Lab 11/08/22 0550  11/09/22 0413 11/12/22 0500  AST 14* 14* 28  ALT 44 36 52*  ALKPHOS 99 93 87  BILITOT 0.2* 0.2* 0.4  PROT 4.8* 4.7* 4.5*  ALBUMIN 1.7* 1.5* <1.5*   No results for input(s): "LIPASE", "AMYLASE" in the last 168 hours. No results for input(s): "AMMONIA" in the last 168 hours. Coagulation profile No results for input(s): "INR", "PROTIME" in the last 168 hours.   CBC: Recent Labs  Lab 11/09/22 0413 11/10/22 0636 11/11/22 0500 11/12/22 0500 11/14/22 0816  WBC 1.0* 1.1* 3.2* 17.3* 18.2*  NEUTROABS  --   --  1.2*  --  13.1*  HGB 8.0* 8.2* 7.9* 7.9* 8.3*  HCT 25.9* 27.1* 25.8* 25.7* 26.9*  MCV 76.4* 76.8* 77.2* 76.7* 78.2*  PLT 199 230 231 340 346   Cardiac Enzymes: No results for input(s): "CKTOTAL", "CKMB", "CKMBINDEX", "TROPONINI" in the last 168 hours. BNP: Invalid input(s): "POCBNP" CBG: Recent Labs  Lab 11/13/22 0541 11/13/22 1153 11/13/22 1743 11/13/22 2349 11/14/22 0525  GLUCAP 131* 158* 191* 146* 110*   D-Dimer No results for input(s): "DDIMER" in the last 72 hours. Hgb A1c No results for input(s): "HGBA1C" in the last 72 hours. Lipid Profile Recent Labs    11/12/22 0500  TRIG 118   Thyroid function studies No results  for input(s): "TSH", "T4TOTAL", "T3FREE", "THYROIDAB" in the last 72 hours.  Invalid input(s): "FREET3" Anemia work up No results for input(s): "VITAMINB12", "FOLATE", "FERRITIN", "TIBC", "IRON", "RETICCTPCT" in the last 72 hours. Microbiology No results found for this or any previous visit (from the past 240 hour(s)).     Studies:  [place] ASCITES (ABDOMEN LIMITED)  Result Date: 11/13/2022 CLINICAL DATA:  224137 Malignant ascites 224137 EXAM: LIMITED ABDOMEN ULTRASOUND FOR ASCITES TECHNIQUE: Limited ultrasound survey for ascites was performed in all four abdominal quadrants. COMPARISON:  IR ultrasound paracentesis, most recently 11/09/2022. CT AP, 10/28/2022. FINDINGS: Focused ultrasound at the area of concern, along the abdominal  quadrants for ascites. Small volume of intra-abdominal free fluid without a significant pocket for safe paracentesis. Procedure was aborted. Performed by Rushie Nyhan, IR NP IMPRESSION: Small volume of abdominal ascites. Planned ultrasound paracentesis was deferred. Michaelle Birks, MD [facility] Electronically Signed   By: Michaelle Birks M.D.   On: 11/13/2022 14:50    Assessment: 47 y.o. 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 Malignant ascites status post paracentesisX3 Recurrent N/V and constipation, secondary to #1, partial bowel obstruction due to the extensive peritoneal metastasis Moderate protein and calorie malnutrition Small PE    Plan:  -today's lab reviewed, wbc elevated, secondary to previous GCSF -will proceed C2 FOLFIRINOX today with full dose, spoke with her nurse and pharmacy today. -will hold zofran for next 3 days because we will give aloxi today  -ok to use compazine or Phenergan as needed for nausea -Other symptom management per primary team and palliative team -I will f/u   Truitt Merle, MD 11/14/2022  9am

## 2022-11-14 NOTE — Progress Notes (Signed)
Chaplain spent significant time with Christina [name] and her sister to provide social and emotional support. Chaplain provided reflective listening as Christina [name] shared about her family, including her 3 boys, and her 3 sisters, and the death of her mother in 2017/12/04.  She also shared about significant health challenges she has had including a uterine rupture with her youngest son, arthritis and her health leading up to her cancer diagnosis.  She tries not to think about the uncertainty of where the cancer is coming from or about her future.  She shared that after everything she has been through, she has had a 100% survival rate and so she is remaining positive.  Knowing that she will get to go home over the weekend or early next week is helping her stay positive now.  She has very good family and friend support.  [address] [place] Pager, [phone]

## 2022-11-14 NOTE — Progress Notes (Addendum)
Confirmed w/ Dr. Burr Medico that chemo is at full dose today. Ok to give Aloxi premed despite pt getting Zofran dose this morning per Dr. Burr Medico.  Kennith Center, Pharm.D., CPP 11/14/2022'@9'$ :37 AM

## 2022-11-15 DIAGNOSIS — R4589 Other symptoms and signs involving emotional state: Secondary | ICD-10-CM

## 2022-11-15 DIAGNOSIS — E440 Moderate protein-calorie malnutrition: Secondary | ICD-10-CM | POA: Insufficient documentation

## 2022-11-15 DIAGNOSIS — C786 Secondary malignant neoplasm of retroperitoneum and peritoneum: Secondary | ICD-10-CM | POA: Diagnosis not present

## 2022-11-15 LAB — MAGNESIUM: Magnesium: 2.1 mg/dL (ref 1.7–2.4)

## 2022-11-15 LAB — COMPREHENSIVE METABOLIC PANEL WITH GFR
ALT: 44 U/L (ref 0–44)
AST: 18 U/L (ref 15–41)
Albumin: 1.8 g/dL — ABNORMAL LOW (ref 3.5–5.0)
Alkaline Phosphatase: 98 U/L (ref 38–126)
Anion gap: 7 (ref 5–15)
BUN: 16 mg/dL (ref 6–20)
CO2: 26 mmol/L (ref 22–32)
Calcium: 8 mg/dL — ABNORMAL LOW (ref 8.9–10.3)
Chloride: 97 mmol/L — ABNORMAL LOW (ref 98–111)
Creatinine, Ser: 0.44 mg/dL (ref 0.44–1.00)
GFR, Estimated: >60 mL/min
Glucose, Bld: 225 mg/dL — ABNORMAL HIGH (ref 70–99)
Potassium: 4.1 mmol/L (ref 3.5–5.1)
Sodium: 130 mmol/L — ABNORMAL LOW (ref 135–145)
Total Bilirubin: 0.4 mg/dL (ref 0.3–1.2)
Total Protein: 5.2 g/dL — ABNORMAL LOW (ref 6.5–8.1)

## 2022-11-15 LAB — PHOSPHORUS: Phosphorus: 3.9 mg/dL (ref 2.5–4.6)

## 2022-11-15 LAB — GLUCOSE, CAPILLARY
Glucose-Capillary: 217 mg/dL — ABNORMAL HIGH (ref 70–99)
Glucose-Capillary: 258 mg/dL — ABNORMAL HIGH (ref 70–99)
Glucose-Capillary: 166 mg/dL — ABNORMAL HIGH (ref 70–99)
Glucose-Capillary: 207 mg/dL — ABNORMAL HIGH (ref 70–99)

## 2022-11-15 LAB — TRIGLYCERIDES: Triglycerides: 486 mg/dL — ABNORMAL HIGH

## 2022-11-15 MED ORDER — INSULIN ASPART 100 UNIT/ML IJ SOLN
0.0000 [IU] | Freq: Three times a day (TID) | INTRAMUSCULAR | Status: DC
  Administered 2022-11-15: 8 [IU] via SUBCUTANEOUS
  Administered 2022-11-15: 3 [IU] via SUBCUTANEOUS
  Administered 2022-11-16: 2 [IU] via SUBCUTANEOUS
  Administered 2022-11-16 (×2): 3 [IU] via SUBCUTANEOUS

## 2022-11-15 MED ORDER — ADULT MULTIVITAMIN W/MINERALS CH
1.0000 | ORAL_TABLET | Freq: Every day | ORAL | Status: DC
  Administered 2022-11-15 – 2022-11-17 (×3): 1 via ORAL
  Filled 2022-11-15 (×3): qty 1

## 2022-11-15 MED ORDER — INSULIN ASPART 100 UNIT/ML IJ SOLN
0.0000 [IU] | Freq: Every day | INTRAMUSCULAR | Status: DC
  Administered 2022-11-15: 2 [IU] via SUBCUTANEOUS

## 2022-11-15 MED ORDER — DEXAMETHASONE 4 MG PO TABS
4.0000 mg | ORAL_TABLET | Freq: Every day | ORAL | Status: DC
  Administered 2022-11-16 – 2022-11-17 (×2): 4 mg via ORAL
  Filled 2022-11-15 (×2): qty 1

## 2022-11-15 MED ORDER — PREGABALIN 50 MG PO CAPS
50.0000 mg | ORAL_CAPSULE | Freq: Every day | ORAL | Status: DC
  Administered 2022-11-15 – 2022-11-17 (×3): 50 mg via ORAL
  Filled 2022-11-15 (×3): qty 1

## 2022-11-15 NOTE — Progress Notes (Signed)
PROGRESS NOTE    Christina [name]  [id number] DOB: [birth year] DOA: 10/19/2022 PCP: [name], MD     Brief Narrative:  Christina [name] is a 47 year old female with history of anxiety, HPV, who presented with complaint of abdominal distention, constipation.  CT abdomen/pelvis showed peritoneal carcinomatosis with extensive metastasis, ascites, multiloculated cystic lesion of the pancreatic tail.  GYN oncology consulted initially .  Underwent paracentesis x 3.  Tumor markers are elevated :CA125, CEA.  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  Oncology, GI following.  S/p  EGD, plan for EUS/biopsy as per GI but has been postponed.  Started  on TPN, PCA pump.  Hospital course complicated by persistent abdominal pain, distention, constipation, poor oral intake.  Palliative care also following for goals of care.  Oncology started inpatient chemotherapy.    New events last 24 hours / Subjective:  Patient was seen and examined.  She was able to eat all her soup and grits yesterday without nausea or vomiting.  Pain is controlled and had not used any injectable Dilaudid since last 24 hours.  Up about and walking around.  Wants to try some solid food.  Tolerated chemo well.  Assessment & Plan:   Principal Problem:   Peritoneal carcinomatosis ([place]) Active Problems:   S/P cesarean section   Generalized abdominal pain   Constipation   Ovarian mass   Pancreatic mass   Tobacco abuse   Malignant ascites   Protein-calorie malnutrition ([facility])   Cancer associated pain   High risk medication use   Palliative care encounter   Counseling and coordination of care   Abnormal cervical Papanicolaou smear   Nausea and vomiting   Anxiety   Medication management   Metastatic adenocarcinoma with peritoneal carcinomatosis -Concern of extensive metastatic disease with unknown primary as seen on the CT scan.  CT concerning for bilateral  cystic/solid ovarian masses, pancreatic lesion also noted.  CEA, CA125 elevated, CA 19-9 is normal. 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   -GI consulted, status post EGD on 2/9. EGD was negative for malignancy in the esophagus, stomach or duodenum but was found to have significant extrinsic compression  in the gastric antrum. EUS was initially planned however it was canceled later due to abdominal distention. Now GI does not think EUS will be high yield procedure and its finding may not change patient's management.  EUS can be done later as an outpatient once her symptoms improved. -Underwent Port-A-Cath placement on 2/9. Oncology started chemotherapy.  Received 2 cycles of chemotherapy, last 2/28. -Endometrial biopsy showed metastatic adenocarcinoma.  Dr. Burr Medico following as well as GYN oncology.  Awaiting further molecular testing at this time   Pain management -Appreciate palliative care team management.  Currently on fentanyl and oxycodone. Symptoms are improving today.  Malignant ascites  -Underwent paracentesis on 2/3, 2/6,  2/10, 2/16, 2/23.   Continue supportive care.   -Continue spironolactone, Lasix.  Will change to oral today. -Paracentesis 2/27, no enough fluid.  Pulmonary embolism -Asymptomatic.  CT chest done with contrast for staging purpose showed small embolus on the proximal right lower lobe pulmonary artery.  Currently on full dose Lovenox.  Will change Eliquis when appropriate.  Pancytopenia -In setting of chemotherapy -Adequate now.  Patient received Granix.   Microcytic anemia/iron deficiency/folic acid deficiency -Continue iron supplement, folic acid supplement. S/p 1 unit PRBC transfusion on 2/16   Moderate protein calorie  malnutrition:  Hypoalbuminemia:  -Remains on TPN -Tolerating full liquid diet and regular diet in the morning today.  Taper off TPN.    Hypertension -Norvasc, losartan.   Stable.  Right middle finger tip infection: Will monitor today.  Hot compress.  Will lance with needle once more localized.   DVT prophylaxis: Lovenox  Code Status: Full Family Communication: None at bedside  Disposition Plan:   Status is: Inpatient Remains inpatient appropriate because: remains on TPN.    Antimicrobials:  Anti-infectives (From admission, onward)    None        Objective: Vitals:   11/14/22 0701 11/14/22 1429 11/14/22 2202 11/15/22 0641  BP:  123/73 105/71 121/73  Pulse:  (!) 103 67 85  Resp:  '18 17 18  '$ Temp:  98 F (36.7 C) 98.1 F (36.7 C) 97.9 F (36.6 C)  TempSrc:  Oral Oral Oral  SpO2:  96% 95% 97%  Weight: 69.8 kg     Height:        Intake/Output Summary (Last 24 hours) at 11/15/2022 1036 Last data filed at 11/15/2022 0332 Gross per 24 hour  Intake 2307.05 ml  Output --  Net 2307.05 ml   Filed Weights   11/08/22 0940 11/09/22 0500 11/14/22 0701  Weight: 70.3 kg 69.8 kg 69.8 kg    Examination:  General exam: Appears calm, uncomfortable Frail, debilitated.  Not in any distress. Respiratory system: Clear to auscultation. Abdomen: Soft.  Nontender.  Slightly protuberant.  CBC: Recent Labs  Lab 11/09/22 0413 11/10/22 0636 11/11/22 0500 11/12/22 0500 11/14/22 0816  WBC 1.0* 1.1* 3.2* 17.3* 18.2*  NEUTROABS  --   --  1.2*  --  13.1*  HGB 8.0* 8.2* 7.9* 7.9* 8.3*  HCT 25.9* 27.1* 25.8* 25.7* 26.9*  MCV 76.4* 76.8* 77.2* 76.7* 78.2*  PLT 199 230 231 340 [id number]   Basic Metabolic Panel: Recent Labs  Lab 11/10/22 0636 11/11/22 0500 11/12/22 0500 11/13/22 0500 11/15/22 0640  NA 130* 131* 133* 133* 130*  K 3.7 3.6 4.1 3.9 4.1  CL 98 99 101 101 97*  CO2 '25 25 25 27 26  '$ GLUCOSE 152* 155* 184* 156* 225*  BUN 21* 22* '19 16 16  '$ CREATININE 0.48 0.48 0.44 0.46 0.44  CALCIUM 7.5* 7.6* 7.8* 7.6* 8.0*  MG 1.8 2.2 1.7 1.9 2.1  PHOS 3.0  --  2.9 3.5 3.9   GFR: Estimated Creatinine Clearance: 73 mL/min (by C-G formula based on SCr of  0.44 mg/dL). Liver Function Tests: Recent Labs  Lab 11/09/22 0413 11/12/22 0500 11/15/22 0640  AST 14* 28 18  ALT 36 52* 44  ALKPHOS 93 87 98  BILITOT 0.2* 0.4 0.4  PROT 4.7* 4.5* 5.2*  ALBUMIN 1.5* <1.5* 1.8*   No results for input(s): "LIPASE", "AMYLASE" in the last 168 hours. No results for input(s): "AMMONIA" in the last 168 hours. Coagulation Profile: No results for input(s): "INR", "PROTIME" in the last 168 hours. Cardiac Enzymes: No results for input(s): "CKTOTAL", "CKMB", "CKMBINDEX", "TROPONINI" in the last 168 hours. BNP (last 3 results) No results for input(s): "PROBNP" in the last 8760 hours. HbA1C: No results for input(s): "HGBA1C" in the last 72 hours. CBG: Recent Labs  Lab 11/14/22 0525 11/14/22 1214 11/14/22 1803 11/14/22 2345 11/15/22 0654  GLUCAP 110* 159* 212* 209* 217*   Lipid Profile: Recent Labs    11/15/22 0538  TRIG 486*   Thyroid Function Tests: No results for input(s): "TSH", "T4TOTAL", "FREET4", "T3FREE", "THYROIDAB" in the last 72 hours. Anemia  Panel: No results for input(s): "VITAMINB12", "FOLATE", "FERRITIN", "TIBC", "IRON", "RETICCTPCT" in the last 72 hours. Sepsis Labs: No results for input(s): "PROCALCITON", "LATICACIDVEN" in the last 168 hours.  No results found for this or any previous visit (from the past 240 hour(s)).    Radiology Studies: [place] ASCITES (ABDOMEN LIMITED)  Result Date: 11/13/2022 CLINICAL DATA:  224137 Malignant ascites 224137 EXAM: LIMITED ABDOMEN ULTRASOUND FOR ASCITES TECHNIQUE: Limited ultrasound survey for ascites was performed in all four abdominal quadrants. COMPARISON:  IR ultrasound paracentesis, most recently 11/09/2022. CT AP, 10/28/2022. FINDINGS: Focused ultrasound at the area of concern, along the abdominal quadrants for ascites. Small volume of intra-abdominal free fluid without a significant pocket for safe paracentesis. Procedure was aborted. Performed by Rushie Nyhan, IR NP IMPRESSION: Small  volume of abdominal ascites. Planned ultrasound paracentesis was deferred. Michaelle Birks, MD [facility] Radiology Electronically Signed   By: Michaelle Birks M.D.   On: 11/13/2022 14:50      Scheduled Meds:  amLODipine  10 mg Oral Daily   Chlorhexidine Gluconate Cloth  6 each Topical Daily   clonazepam  0.25 mg Oral BID   dexamethasone (DECADRON) injection  4 mg Intravenous Daily   enoxaparin (LOVENOX) injection  1 mg/kg Subcutaneous Q12H   fentaNYL  1 patch Transdermal Q72H   ferrous sulfate  325 mg Oral Q breakfast   fluorouracil (ADRUCIL) 4,050 mg in dextrose 5 % 1,000 mL chemo infusion  2,400 mg/m2 (Treatment Plan Recorded) Intravenous Once   folic acid  1 mg Oral Daily   furosemide  40 mg Intravenous Daily   insulin aspart  0-15 Units Subcutaneous TID WC   insulin aspart  0-5 Units Subcutaneous QHS   loratadine  10 mg Oral Daily   losartan  50 mg Oral Daily   multivitamin with minerals  1 tablet Oral Daily   OLANZapine zydis  5 mg Oral QHS   pantoprazole (PROTONIX) IV  40 mg Intravenous Q24H   polyethylene glycol  17 g Oral Daily   pregabalin  25 mg Oral QHS   senna-docusate  2 tablet Oral QHS   sodium chloride flush  10-40 mL Intracatheter Q12H   spironolactone  25 mg Oral Daily   Continuous Infusions:  TPN ADULT (ION) 70 mL/hr at 11/15/22 0332     LOS: 26 days   Time spent:  35 minutes

## 2022-11-15 NOTE — Progress Notes (Addendum)
Daily Progress Note   Patient Name: Christina [name]       Date: 11/15/2022 DOB: [birth year]  Age: 47 y.o. MRN#: [id number] Attending Physician: Barb Merino, MD Primary Care Physician: [name], MD Admit Date: 10/19/2022 Length of Stay: 26 days  Reason for Consultation/Follow-up: Establishing goals of care and symptom management  Subjective:   CC: Patient feels she is tolerating chemo "better" this time. 1 Following up regarding symptom management.  Subjective:  Reviewed EMR prior to seeing patient.  In the past 24 hours patient has received IV Dilaudid 1 mg x 2 doses.  Patient has continued olanzapine 5 mg nightly since 11/12/2022.  Patient's fentanyl patch was increased to 48mg on 11/13/22.  Presented to bedside to check on patient.  Patient seen sitting up in bed.  Much more interactive today.  Patient notes that she is feeling better and feels that she has tolerated chemo "better" this time.  Patient also notes that her appetite has improved.  Patient denying nausea at this time.  Patient able to describe eating chicken noodle soup yesterday.  Patient able to eat a good amount of eggs and cottage cheese this morning for breakfast.  Encouraged to continued oral intake based on how patient is feeling.  Patient hopeful TPN can be discontinued soon so she can work towards getting out of the hospital.  Inquired about patient's pain at this time.  Patient feels her pain is much better controlled on the increased fentanyl patch dose.  Patient has only needed IV Dilaudid x 2 doses in the past 24 hours which is a decrease compared to previous days.  We discussed with patient having improved oral intake, if patient having breakthrough pain should take oral Dilaudid to make sure we are finding an appropriate dose for her to be discharged on.  Patient agreeing with this plan. Patient does not increase in "tingling" sensation in hands in feet, potentially associated with chemotherapy. Discussed and  will plan to increase Lyrica dose at this time.   Inquired about patient's diarrhea symptoms.  Patient does feel her diarrhea is improving at this time as she is not having sudden urges for bowel movements.  Patient feels this will continue to improve with time.  We discussed balancing constipation with diarrhea.  Would recommend staying on a bowel regimen while taking opioids and due to her cancer process.  Also discussed Imodium could be considered in the future if needed though do not want to give if not needed as do not want to cause patient to become constipated again.  Patient voiced agreeing with continuing with bowel regimen at this time and will hold off on providing Imodium unless diarrhea worsens.  Spent time providing emotional support via active listening.  Patient greatly enjoyed visit with chaplain yesterday.  Patient spent time talking about her 3 children and how excited she is to be able to see them when she gets home.  Patient hopes she gets "much more time" with her children moving forward.  Patient continues to hope for a "miracle" regarding her cancer as she has been through so many miracles in her life to this point.  Acknowledged this and also importance of continued conversations with family and providers regarding her medical care moving forward.  Patient notes plan to follow-up in  [place] for continued chemotherapy.  [facility] to get continued palliative medicine follow-up upon discharge.  Patient  voiced appreciation for visit today.  Noted palliative medicine team will continue to follow along.  Review of Systems Improved appetite and pain control Objective:   Vital Signs:  BP 121/73 (BP Location: Left Arm)   Pulse 85   Temp 97.9 F (36.6 C) (Oral)   Resp 18   Ht '4\' 11"'$  (1.499 m)   Wt 69.8 kg   LMP 10/12/2022   SpO2 97%   BMI 31.08 kg/m   Physical  Exam: General: NAD, alert, pleasant, laying in bed Eyes: No drainage noted HENT: moist mucous membranes Cardiovascular: RRR Respiratory: no increased work of breathing noted, not in respiratory distress Abdomen: distended Extremities: Moving all appropriately Skin: no rashes or lesions on visible skin Neuro: A&Ox4, following commands easily Psych: appropriately answers all questions  Imaging:  I personally reviewed recent imaging.   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  Palliative medicine team consulted to assist with symptom management.    Recommendations/Plan: # Complex medical decision making/goals of care:  -Continuing with full scope of care at this time. Patient will need to follow up with outpatient PMT at [facility] to hopefully continue supporting goals for medical care moving forward.  Patient did note plan is to continue follow for cancer therapy is planned for [place].  Noted would reach out to outpatient palliative medicine provider to determine best plan for follow-up with palliative medicine.  -Patient suppressing continue to hope for a "miracle" and much more time with her children.  While agreeing with plan for hoping for the best, encourage discussions with family and medical providers if things do not go as hoped to help plan for the future being that she has 3 young children.  -  Code Status: Full Code  # Symptom management:    - Pain, Acute on chronic in the setting of malignant ascites with metastatic adenocarcinoma to peritoneum with large cystic lesion in  pancreatic tail and bilateral ovaries                -Continue  Fentanyl patch to 75 mcg Q 72 hours.                 -Continue hydromorphone IV '1mg'$  q4hrs prn  -Continue hydromorphone po 2-'4mg'$  q3 hrs prn                -Will adjust to oral dexamethasone at this time after coordination with hospitalist/oncology. Start po dexamethasone 4 mg on 3/1.  Would decrease to 5 mg every 7 days.  Could more rapidly taper if needed.  -Increase Lyrica po to '50mg'$  qhs           -Discontinued flexeril as patient not requiring, does not improve her type of pain, and can interact with multiple medications patient receiving. Please do not restart.    Nausea/Vomiting, acute in setting of malignant ascites, receiving chemotherapy  EKG on 11/08/22 noted Qtc of 419.  Patient feels nausea/vomiting resolved at this time.    -Continue compazine prn   -Continue olanzapine '5mg'$  qhs (started 2/26)                  Anxiety, in  setting of recent cancer diagnosis                -Continue Klonopin 0.'25mg'$  BID                  -Diarrhea   Patient initially had constipation on admission in setting of metastatic adenocarcinoma to peritoneum with malignant ascites. Was initially on Miralax 17gm scheduled 3 times daily by GI.   -Continue scheduled Miralax to 17gm once daily.    -Would only consider addition of Loperamide 2gm daily prn if diarrhea worsens. Currently patient notes improvement of diarrhea symptoms.   # Psychosocial Support:  -Sister, 3 children (20,8,14 yo)- will plan to discuss [facility] support on 3/1  # Discharge Planning: TBD   -Will need follow up with PMT at [facility]. Have reached out to this provider today to assist with planing for outpatient palliative medicine follow up at time of discharge.   Discussed with: patient, bedside RN, hospitalist, oncology   Thank you for allowing the palliative care team to participate in the care [name].  Chelsea Aus, DO Palliative Care Provider PMT # [phone]  If  patient remains symptomatic despite maximum doses, please call PMT at [phone] between 0700 and 1900. Outside of these hours, please call attending, as PMT does not have night coverage.  This provider spent a total of 51 minutes providing patient's care.  Includes review of EMR, discussing care with other staff members involved in patient's medical care, obtaining relevant history and information from patient and/or patient's family, and personal review of imaging and lab work. Greater than 50% of the time was spent counseling and coordinating care related to the above assessment and plan.

## 2022-11-15 NOTE — Progress Notes (Addendum)
Nutrition Follow-up  DOCUMENTATION CODES:   Non-severe (moderate) malnutrition in context of chronic illness  INTERVENTION:   -TPN management per Pharmacy -weaned off today   -Encourage PO as tolerated  NEW NUTRITION DIAGNOSIS:   Moderate Malnutrition related to chronic illness, cancer and cancer related treatments as evidenced by mild fat depletion, moderate muscle depletion.  GOAL:   Patient will meet greater than or equal to 90% of their needs  Progressing.  MONITOR:   PO intake, Supplement acceptance, I & O's, Labs, Weight trends  ASSESSMENT:   47 y.o. female with medical history significant of anxiety, HPV comes to the hospital at [place] ED with complaints of abdominal distention and constipation. CT abdomen pelvis showed peritoneal carcinomatosis with extensive metastases, ascites and multiloculated cystic lesion of the pancreatic tail.  GYN oncology consulted, underwent paracentesis, 3.5 L was removed.  2/2: admitted 2/3: s/p paracentesis, yield 3.5L 2/6: s/p paracentesis, yield 3.1L 2/9: s/p EGD, s/p PAC placement 2/16: s/p paracentesis, yield 3.5L  2/23: s/p paracentesis, yield 3.1L  Patient currently eating much better. States she had a high protein breakfast of bagel, eggs and cottage cheese (~600 kcals, 35g protein). Has ordered vegetable stir-fry for lunch.   Pt now tolerating PO so TPN is being weaned off today.   Admission weight: 153 lbs Current weight: 153 lbs  Medications: Ferrous sulfate, Folic acid, Lasix, Insulin, Miralax, Senokot, Multivitamin with minerals daily  Labs reviewed: CBGs: 110-258 Low Na TG: 486    NUTRITION - FOCUSED PHYSICAL EXAM:  Flowsheet Row Most Recent Value  Orbital Region Mild depletion  Upper Arm Region Moderate depletion  Thoracic and Lumbar Region Unable to assess  Buccal Region Mild depletion  Temple Region Mild depletion  Clavicle Bone Region No depletion  Clavicle and Acromion Bone Region No depletion   Scapular Bone Region Moderate depletion  Dorsal Hand Mild depletion  Patellar Region Moderate depletion  Anterior Thigh Region Moderate depletion  Posterior Calf Region Moderate depletion  Edema (RD Assessment) None  Hair Unable to assess  [covered]  Eyes Reviewed  Mouth Reviewed  [poor dentition]  Skin Reviewed  Nails Reviewed       Diet Order:   Diet Order             Diet regular Fluid consistency: Thin  Diet effective now                   EDUCATION NEEDS:   No education needs have been identified at this time  Skin:  Skin Assessment: Reviewed RN Assessment  Last BM:  2/2 -type 7  Height:   Ht Readings from Last 1 Encounters:  10/26/22 '4\' 11"'$  (1.499 m)    Weight:   Wt Readings from Last 1 Encounters:  11/14/22 69.8 kg    BMI:  Body mass index is 31.08 kg/m.  Estimated Nutritional Needs:   Kcal:  1500-1700  Protein:  75-90g  Fluid:  1.5L/day  Clayton Bibles, MS, RD, LDN Inpatient Clinical Dietitian Contact information available via Amion

## 2022-11-15 NOTE — Progress Notes (Signed)
Christina [name]   DOB:11-Jun-1976   [id number]   [id number]  Med/onc follow up note  Subjective: Patient is feeling better, started eating regular food yesterday, and was ready to eat a cheeseburger for dinner when I saw her in late afternoon.  Her diarrhea has improved, she had 2 loose bowel movement today.  She is tolerating chemo well, no other complaints.   Vitals:   11/15/22 0641 11/15/22 1337  BP: 121/73 128/76  Pulse: 85 94  Resp: 18 20  Temp: 97.9 F (36.6 C) 98 F (36.7 C)  SpO2: 97% 96%    Body mass index is 31.08 kg/m.  Intake/Output Summary (Last 24 hours) at 11/15/2022 2117 Last data filed at 11/15/2022 1106 Gross per 24 hour  Intake 973.32 ml  Output --  Net 973.32 ml     Sclerae unicteric  Oropharynx clear  No peripheral adenopathy  Lungs clear -- no rales or rhonchi  Heart regular rate and rhythm  Abdomen distended with mild diffuse tenderness.  MSK no focal spinal tenderness, no peripheral edema  Neuro nonfocal    CBG (last 3)  Recent Labs    11/15/22 0654 11/15/22 1156 11/15/22 1623  GLUCAP 217* 258* 166*     Labs:   Urine Studies No results for input(s): "UHGB", "CRYS" in the last 72 hours.  Invalid input(s): "UACOL", "UAPR", "USPG", "UPH", "UTP", "UGL", "UKET", "UBIL", "UNIT", "UROB", "ULEU", "UEPI", "UWBC", "URBC", "UBAC", "CAST", "UCOM", "BILUA"  Basic Metabolic Panel: Recent Labs  Lab 11/10/22 0636 11/11/22 0500 11/12/22 0500 11/13/22 0500 11/15/22 0640  NA 130* 131* 133* 133* 130*  K 3.7 3.6 4.1 3.9 4.1  CL 98 99 101 101 97*  CO2 '25 25 25 27 26  '$ GLUCOSE 152* 155* 184* 156* 225*  BUN 21* 22* '19 16 16  '$ CREATININE 0.48 0.48 0.44 0.46 0.44  CALCIUM 7.5* 7.6* 7.8* 7.6* 8.0*  MG 1.8 2.2 1.7 1.9 2.1  PHOS 3.0  --  2.9 3.5 3.9   GFR Estimated Creatinine Clearance: 73 mL/min (by C-G formula based on SCr of 0.44 mg/dL). Liver Function Tests: Recent Labs  Lab 11/09/22 0413 11/12/22 0500 11/15/22 0640  AST 14* 28 18  ALT  36 52* 44  ALKPHOS 93 87 98  BILITOT 0.2* 0.4 0.4  PROT 4.7* 4.5* 5.2*  ALBUMIN 1.5* <1.5* 1.8*   No results for input(s): "LIPASE", "AMYLASE" in the last 168 hours. No results for input(s): "AMMONIA" in the last 168 hours. Coagulation profile No results for input(s): "INR", "PROTIME" in the last 168 hours.   CBC: Recent Labs  Lab 11/09/22 0413 11/10/22 0636 11/11/22 0500 11/12/22 0500 11/14/22 0816  WBC 1.0* 1.1* 3.2* 17.3* 18.2*  NEUTROABS  --   --  1.2*  --  13.1*  HGB 8.0* 8.2* 7.9* 7.9* 8.3*  HCT 25.9* 27.1* 25.8* 25.7* 26.9*  MCV 76.4* 76.8* 77.2* 76.7* 78.2*  PLT 199 230 231 340 346   Cardiac Enzymes: No results for input(s): "CKTOTAL", "CKMB", "CKMBINDEX", "TROPONINI" in the last 168 hours. BNP: Invalid input(s): "POCBNP" CBG: Recent Labs  Lab 11/14/22 1803 11/14/22 2345 11/15/22 0654 11/15/22 1156 11/15/22 1623  GLUCAP 212* 209* 217* 258* 166*   D-Dimer No results for input(s): "DDIMER" in the last 72 hours. Hgb A1c No results for input(s): "HGBA1C" in the last 72 hours. Lipid Profile Recent Labs    11/15/22 0538  TRIG 486*   Thyroid function studies No results for input(s): "TSH", "T4TOTAL", "T3FREE", "THYROIDAB" in the last 72 hours.  Invalid input(s): "FREET3" Anemia work up No results for input(s): "VITAMINB12", "FOLATE", "FERRITIN", "TIBC", "IRON", "RETICCTPCT" in the last 72 hours. Microbiology No results found for this or any previous visit (from the past 240 hour(s)).     Studies:  No results found.  Assessment: 47 y.o. 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 Malignant ascites status post paracentesisX3 Recurrent N/V and constipation, secondary to #1, partial bowel obstruction due to the extensive peritoneal metastasis Moderate protein and calorie malnutrition Small PE    Plan:  -Patient is clinically doing much  better, less nausea, able to tolerate food, which is a good sign, hope she is responding to chemo  -she will completed chemo tomorrow afternoon.  -discharge plan per primary team  -I will refer her to my partner Dr. Bobby Rumpf at [facility] office, to establish care.    Truitt Merle, MD 11/15/2022

## 2022-11-15 NOTE — Progress Notes (Signed)
PHARMACY - TOTAL PARENTERAL NUTRITION CONSULT NOTE   Indication:  gastric outlet obstruction with extrinsic compression   Patient Measurements: Height: '4\' 11"'$  (149.9 cm) Weight: 69.8 kg (153 lb 14.1 oz) IBW/kg (Calculated) : 43.2 TPN AdjBW (KG): 49.8 Body mass index is 31.08 kg/m. Usual Weight: 170 lbs  Assessment: 68 yoF with newly dx metastatic adenocarcinoma to peritoneum, with large cystic lesion in pancreatic tail, and bilateral ovaries. Malignant ascites s/p paracentesis x 5. EGD: gastric outlet obstruction w/ extrinsic compression. Pharmacy consulted to manage TPN for nutrition support.   Glucose / Insulin: no hx DM. CBGs 159-217 (goal < 180) - SSI / 24 hrs: 13 units - Insulin in TPN: 25 units (inc from 20 units on 2/26, dec from 30 on 2/28) - Dexamethasone '10mg'$  IV 2/14, Dex '4mg'$  IV daily starting 2/16, additional Dexamethasone '10mg'$  IV premed on 2/28 Electrolytes:  lytes stable and wnl except Na remains low (max concentration in TPN), Cl low.   Renal: SCr stable Hepatic: LFTs, Tbili WNL.  Albumin 1.8.  Triglycerides: 118 (2/26) Intake / Output; MIVF: I/O not reordered - No IVF. Lasix '40mg'$  IV daily. Spironolactone '25mg'$  PO daily.    GI Imaging:  2/11 CT abd/pelvis: Multiple dilated loops of small bowel have developed which may represent ileus versus partial or developing small bowel obstruction. Numerous thickened, hyperenhancing loops of distal small bowel, suggestive of infectious or inflammatory enteritis. Abnormal wall thickening of the gastric antrum, which could be secondary to gastritis or malignancy. Large bilateral multi-cystic adnexal masses concerning for primary or metastatic ovarian malignancy. Multiloculated cystic lesion of the pancreatic tail. 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 2/12 abdominal X-ray: Persistent moderate gas distension of large and small bowel suggestive of ileus vs. partial obstruction  GI Surgeries / Procedures:  2/9  EGD: gastric outlet obstruction with extrinsic compression. s/p Paracentesis x 5. Last paracentesis 2/23 yielded 3.1 L.  Central access:  - 2/9: single lumen PAC for chemo - 2/12: PICC for TPN  TPN start date: 2/12  Nutritional Goals: Goal TPN rate is 92m/hr (provides 86 g of protein and 1612 kcals per day)  RD Assessment:  Estimated Needs Total Energy Estimated Needs: 1500-1700 Total Protein Estimated Needs: 75-90g Total Fluid Estimated Needs: 1.5L/day  Current Nutrition:  TPN Diet: changed to regular on 2/29.  RN reports patient eating chicken noodle soup yesterday, and egg/bagel this morning.      Plan:  Per MD, wean TPN off today.  Decrease TPN to 1/2 rate (35 ml/hr) for 2 hours and then d/c.  RN to monitor PO intake, nausea.   Change to PO multivitamin.  MD to monitor insulin needs and adjust as needed.  Continue Moderate SSI with meals and QHS coverage.     CGretta ArabPharmD, BCPS [facility] main pharmacy [phone]/29/2024 7:45 AM

## 2022-11-15 NOTE — TOC Progression Note (Signed)
Transition of Care [facility]) - Progression Note    Patient Details  Name: Christina [name] MRN: [id number] Date of Birth: 12-31-1975  [facility] of [facility]) CM/SW [place], RN Phone Number:[phone]  11/15/2022, 1:11 PM  Clinical Narrative:    TOC continues to follow for disposition. Per documentation patient is tolerating clear liquids. Plan to taper TPN. TOC following.        Expected Discharge Plan and Services                                               Social Determinants of Health (SDOH) Interventions SDOH Screenings   Food Insecurity: No Food Insecurity (10/20/2022)  Housing: Low Risk  (10/20/2022)  Transportation Needs: No Transportation Needs (10/20/2022)  Utilities: Not At Risk (10/20/2022)  Tobacco Use: High Risk (10/29/2022)    Readmission Risk Interventions     No data to display

## 2022-11-16 ENCOUNTER — Telehealth: Payer: Self-pay | Admitting: Oncology

## 2022-11-16 ENCOUNTER — Other Ambulatory Visit (HOSPITAL_COMMUNITY): Payer: Self-pay

## 2022-11-16 ENCOUNTER — Encounter: Payer: Self-pay | Admitting: Hematology

## 2022-11-16 DIAGNOSIS — Z7189 Other specified counseling: Secondary | ICD-10-CM

## 2022-11-16 DIAGNOSIS — C786 Secondary malignant neoplasm of retroperitoneum and peritoneum: Secondary | ICD-10-CM | POA: Diagnosis not present

## 2022-11-16 LAB — GLUCOSE, CAPILLARY
Glucose-Capillary: 160 mg/dL — ABNORMAL HIGH (ref 70–99)
Glucose-Capillary: 149 mg/dL — ABNORMAL HIGH (ref 70–99)
Glucose-Capillary: 155 mg/dL — ABNORMAL HIGH (ref 70–99)
Glucose-Capillary: 132 mg/dL — ABNORMAL HIGH (ref 70–99)

## 2022-11-16 MED ORDER — DEXAMETHASONE 4 MG PO TABS: 4.0000 mg | ORAL_TABLET | Freq: Every day | ORAL | 0 refills | Status: AC

## 2022-11-16 MED ORDER — FERROUS SULFATE 325 (65 FE) MG PO TABS: 325.0000 mg | ORAL_TABLET | Freq: Every day | ORAL | 0 refills | Status: AC

## 2022-11-16 MED ORDER — SPIRONOLACTONE 25 MG PO TABS: 25.0000 mg | ORAL_TABLET | Freq: Every day | ORAL | 0 refills | Status: AC

## 2022-11-16 MED ORDER — APIXABAN 5 MG PO TABS
5.0000 mg | ORAL_TABLET | Freq: Two times a day (BID) | ORAL | Status: DC
  Administered 2022-11-16 – 2022-11-17 (×3): 5 mg via ORAL
  Filled 2022-11-16 (×3): qty 1

## 2022-11-16 MED ORDER — FOLIC ACID 1 MG PO TABS: 1.0000 mg | ORAL_TABLET | Freq: Every day | ORAL | 0 refills | Status: AC

## 2022-11-16 MED ORDER — HYDROMORPHONE HCL 2 MG PO TABS: 2.0000 mg | ORAL_TABLET | ORAL | 0 refills | Status: AC | PRN

## 2022-11-16 MED ORDER — LOSARTAN POTASSIUM 50 MG PO TABS: 50.0000 mg | ORAL_TABLET | Freq: Every day | ORAL | 0 refills | Status: AC

## 2022-11-16 MED ORDER — FENTANYL 75 MCG/HR TD PT72: 1.0000 | MEDICATED_PATCH | TRANSDERMAL | 0 refills | Status: DC

## 2022-11-16 MED ORDER — SENNOSIDES-DOCUSATE SODIUM 8.6-50 MG PO TABS: 2.0000 | ORAL_TABLET | Freq: Every day | ORAL | 0 refills | Status: AC

## 2022-11-16 MED ORDER — NALOXONE HCL 0.4 MG/ML IJ SOLN: 0.4000 mg | INTRAMUSCULAR | 2 refills | Status: AC | PRN

## 2022-11-16 MED ORDER — APIXABAN (ELIQUIS) VTE STARTER PACK (10MG AND 5MG)
ORAL_TABLET | ORAL | 0 refills | Status: DC
  Filled 2022-11-16: qty 74, 30d supply, fill #0

## 2022-11-16 MED ORDER — CLONAZEPAM 0.25 MG PO TBDP: 0.2500 mg | ORAL_TABLET | Freq: Two times a day (BID) | ORAL | 0 refills | Status: DC

## 2022-11-16 MED ORDER — OLANZAPINE 5 MG PO TBDP: 5.0000 mg | ORAL_TABLET | Freq: Every day | ORAL | 0 refills | Status: DC

## 2022-11-16 MED ORDER — AMLODIPINE BESYLATE 10 MG PO TABS: 10.0000 mg | ORAL_TABLET | Freq: Every day | ORAL | 0 refills | Status: AC

## 2022-11-16 MED ORDER — APIXABAN 5 MG PO TABS: 5.0000 mg | ORAL_TABLET | Freq: Two times a day (BID) | ORAL | 2 refills | Status: AC

## 2022-11-16 MED ORDER — ADULT MULTIVITAMIN W/MINERALS CH: 1.0000 | ORAL_TABLET | Freq: Every day | ORAL | 0 refills | Status: AC

## 2022-11-16 MED ORDER — PREGABALIN 50 MG PO CAPS: 50.0000 mg | ORAL_CAPSULE | Freq: Every day | ORAL | 0 refills | Status: AC

## 2022-11-16 MED ORDER — LORATADINE 10 MG PO TABS: 10.0000 mg | ORAL_TABLET | Freq: Every day | ORAL | 0 refills | Status: AC

## 2022-11-16 NOTE — Progress Notes (Signed)
GYN Oncology Progress Note  Patient alert, oriented, in no acute distress. She reports doing well. The increase in her fentanyl patch has helped with pain cobntrol. She has a great appetite and is tolerating solid food with no nausea or emesis reported. Continues to have loose stools.   States she may go home over the weekend. No concerns voiced. Advised patient we are still waiting on additional testing to assist with determining a diagnosis. Advised she would be updated when the results return. Continue plan of care per Hospitalist and Dr. Burr Medico.

## 2022-11-16 NOTE — Progress Notes (Signed)
Christina [name]   DOB:18-Jul-1976   [id number]   [id number]  Med/onc follow up note  Subjective: Patient was finishing chemo when I saw her around 5pm., overall tolerating well, does have intermittent mild nausea.  She had good breakfast, and had a bowel movement this morning.  But she has been feeling bloated since then, and has not eaten anything since breakfast.  No other new complaints.   Vitals:   11/16/22 0612 11/16/22 1324  BP: 127/86 (!) 131/92  Pulse: 88 100  Resp: 18 20  Temp: 98 F (36.7 C) 98.4 F (36.9 C)  SpO2: 97% 97%    Body mass index is 31.08 kg/m. No intake or output data in the 24 hours ending 11/16/22 1702    Sclerae unicteric  Oropharynx clear  No peripheral adenopathy  Lungs clear -- no rales or rhonchi  Heart regular rate and rhythm  Abdomen distended with mild diffuse tenderness.  MSK no focal spinal tenderness, no peripheral edema  Neuro nonfocal    CBG (last 3)  Recent Labs    11/16/22 0746 11/16/22 1201 11/16/22 1650  GLUCAP 160* 149* 155*     Labs:   Urine Studies No results for input(s): "UHGB", "CRYS" in the last 72 hours.  Invalid input(s): "UACOL", "UAPR", "USPG", "UPH", "UTP", "UGL", "UKET", "UBIL", "UNIT", "UROB", "ULEU", "UEPI", "UWBC", "URBC", "UBAC", "CAST", "UCOM", "BILUA"  Basic Metabolic Panel: Recent Labs  Lab 11/10/22 0636 11/11/22 0500 11/12/22 0500 11/13/22 0500 11/15/22 0640  NA 130* 131* 133* 133* 130*  K 3.7 3.6 4.1 3.9 4.1  CL 98 99 101 101 97*  CO2 '25 25 25 27 26  '$ GLUCOSE 152* 155* 184* 156* 225*  BUN 21* 22* '19 16 16  '$ CREATININE 0.48 0.48 0.44 0.46 0.44  CALCIUM 7.5* 7.6* 7.8* 7.6* 8.0*  MG 1.8 2.2 1.7 1.9 2.1  PHOS 3.0  --  2.9 3.5 3.9   GFR Estimated Creatinine Clearance: 73 mL/min (by C-G formula based on SCr of 0.44 mg/dL). Liver Function Tests: Recent Labs  Lab 11/12/22 0500 11/15/22 0640  AST 28 18  ALT 52* 44  ALKPHOS 87 98  BILITOT 0.4 0.4  PROT 4.5* 5.2*  ALBUMIN <1.5* 1.8*    No results for input(s): "LIPASE", "AMYLASE" in the last 168 hours. No results for input(s): "AMMONIA" in the last 168 hours. Coagulation profile No results for input(s): "INR", "PROTIME" in the last 168 hours.   CBC: Recent Labs  Lab 11/10/22 0636 11/11/22 0500 11/12/22 0500 11/14/22 0816  WBC 1.1* 3.2* 17.3* 18.2*  NEUTROABS  --  1.2*  --  13.1*  HGB 8.2* 7.9* 7.9* 8.3*  HCT 27.1* 25.8* 25.7* 26.9*  MCV 76.8* 77.2* 76.7* 78.2*  PLT 230 231 340 346   Cardiac Enzymes: No results for input(s): "CKTOTAL", "CKMB", "CKMBINDEX", "TROPONINI" in the last 168 hours. BNP: Invalid input(s): "POCBNP" CBG: Recent Labs  Lab 11/15/22 1623 11/15/22 2145 11/16/22 0746 11/16/22 1201 11/16/22 1650  GLUCAP 166* 207* 160* 149* 155*   D-Dimer No results for input(s): "DDIMER" in the last 72 hours. Hgb A1c No results for input(s): "HGBA1C" in the last 72 hours. Lipid Profile Recent Labs    11/15/22 0538  TRIG 486*   Thyroid function studies No results for input(s): "TSH", "T4TOTAL", "T3FREE", "THYROIDAB" in the last 72 hours.  Invalid input(s): "FREET3" Anemia work up No results for input(s): "VITAMINB12", "FOLATE", "FERRITIN", "TIBC", "IRON", "RETICCTPCT" in the last 72 hours. Microbiology No results found for this or any previous visit (  from the past 240 hour(s)).     Studies:  No results found.  Assessment: 47 y.o. 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 Malignant ascites status post paracentesisX3 Recurrent N/V and constipation, secondary to #1, partial bowel obstruction due to the extensive peritoneal metastasis Moderate protein and calorie malnutrition Small PE    Plan:  -finished cycle 2 FOLFIRINOX today, fatigue and nausea may get little worse next few days secondary to chemo  -she has been tolerating regular diet well until this morning, I  suggested her to have liquid or soft diet tonight due to her nausea and bloating. -discharge per primary team, make sure she has adequate antiemetics on discharge (Compazine, Zofran, Phenergan and olanzapine), she is on tapering dose dexamethasone, which will help nausea and fatigue also -I have referred her to [facility] Dr. Bobby Rumpf, I will confirm the appointment on Monday.    Truitt Merle, MD 11/16/2022

## 2022-11-16 NOTE — Progress Notes (Signed)
RUE PICC removed per protocol. Site unremarkable except mild erythema/ irritation under the border of dressing. Pt instructed to keep dressing dry and intact for 24 hours.

## 2022-11-16 NOTE — Telephone Encounter (Signed)
Called [facility] and advised that it can take 5 - 7 days for the [facility] ID report to be resulted.  Rhonda with [facility] will check on it on Monday.  We will let her know as soon as the results are back.

## 2022-11-16 NOTE — Discharge Instructions (Signed)
Information on my medicine - ELIQUIS (apixaban) This medication education was reviewed with me or my healthcare representative as part of my discharge preparation.  The pharmacist that spoke with me during my hospital stay was: Altha Harm  Why was Eliquis prescribed for you? Eliquis was prescribed to treat blood clots that may have been found in the veins of your legs (deep vein thrombosis) or in your lungs (pulmonary embolism) and to reduce the risk of them occurring again.  What do You need to know about Eliquis ? The dose is ONE 5 mg tablet taken TWICE daily.  Eliquis may be taken with or without food.   Try to take the dose about the same time in the morning and in the evening. If you have difficulty swallowing the tablet whole please discuss with your pharmacist how to take the medication safely.  Take Eliquis exactly as prescribed and DO NOT stop taking Eliquis without talking to the doctor who prescribed the medication.  Stopping may increase your risk of developing a new blood clot.  Refill your prescription before you run out.  After discharge, you should have regular check-up appointments with your healthcare provider that is prescribing your Eliquis.    What do you do if you miss a dose? If a dose of ELIQUIS is not taken at the scheduled time, take it as soon as possible on the same day and twice-daily administration should be resumed. The dose should not be doubled to make up for a missed dose.  Important Safety Information A possible side effect of Eliquis is bleeding. You should call your healthcare provider right away if you experience any of the following: Bleeding from an injury or your nose that does not stop. Unusual colored urine (red or dark brown) or unusual colored stools (red or black). Unusual bruising for unknown reasons. A serious fall or if you hit your head (even if there is no bleeding).  Some medicines may interact with Eliquis and might increase your  risk of bleeding or clotting while on Eliquis. To help avoid this, consult your healthcare provider or pharmacist prior to using any new prescription or non-prescription medications, including herbals, vitamins, non-steroidal anti-inflammatory drugs (NSAIDs) and supplements.  This website has more information on Eliquis (apixaban): http://www.eliquis.com/eliquis/home

## 2022-11-16 NOTE — Progress Notes (Signed)
PROGRESS NOTE    Christina [name]  [id number] DOB: 07-23-1976 DOA: 10/19/2022 PCP: [name], MD     Brief Narrative:  Christina [name] is a 47 year old female with history of anxiety, HPV, who presented with complaint of abdominal distention, constipation.  CT abdomen/pelvis showed peritoneal carcinomatosis with extensive metastasis, ascites, multiloculated cystic lesion of the pancreatic tail.  GYN oncology consulted initially .  Underwent paracentesis x 3.  Tumor markers were elevated :CA125, CEA.  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  Oncology, GI following.  S/p  EGD, plan for EUS/biopsy as per GI but was  postponed.  Started  on TPN, PCA pump.  Also started on inpatient chemotherapies.  Hospital course complicated by persistent abdominal pain, distention, constipation, poor oral intake.  Palliative care also following for goals of care.  Oncology started inpatient chemotherapy.   Clinically improving.  New events last 24 hours / Subjective:  Seen and examined.  Denies any nausea or vomiting.  Patient was able to eat regular diet and finish her plate all day yesterday.  2 loose bowel movements last 24 hours.  On scheduled MiraLAX. Pain is controlled on fentanyl patch and occasional use of oral Dilaudid.  Injectable pain medications are not needed. Completing chemotherapy today. Patient had a small infection on right index finger tip, lanced with a needle with expression of pus. Sister at the bedside.  Really excited to know about possible discharge home soon. Will prepare her medications today.  Assessment & Plan:   Principal Problem:   Peritoneal carcinomatosis ([place]) Active Problems:   S/P cesarean section   Generalized abdominal pain   Constipation   Ovarian mass   Pancreatic mass   Tobacco abuse   Malignant ascites   Protein-calorie malnutrition (HCC)   Cancer associated pain   High risk  medication use   Palliative care encounter   Counseling and coordination of care   Abnormal cervical Papanicolaou smear   Nausea and vomiting   Anxiety   Medication management   Need for emotional support   Malnutrition of moderate degree   Metastatic adenocarcinoma with peritoneal carcinomatosis -Concern of extensive metastatic disease with unknown primary as seen on the CT scan.  CT concerning for bilateral cystic/solid ovarian masses, pancreatic lesion also noted.  CEA, CA125 elevated, CA 19-9 is normal. 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    -GI consulted, status post EGD on 2/9. EGD was negative for malignancy in the esophagus, stomach or duodenum but was found to have significant extrinsic compression  in the gastric antrum. EUS was initially planned however it was canceled later due to abdominal distention. Clinically improved.  EUS was not pursued as patient is already diagnosed and further invasive testing is not going to change management.   -Underwent Port-A-Cath placement on 2/9. Oncology started chemotherapy.   Received 2 cycles of chemotherapy, last 2/28-3/1.  -Endometrial biopsy showed metastatic adenocarcinoma.  Dr. Burr Medico following as well as GYN oncology.  Awaiting further molecular testing at this time   Pain management -Appreciate palliative care team management.   Fentanyl 75 mcg/h patch, Dilaudid 2 mg every 3 hours by mouth as needed, Lyrica 50 mg at night, Zyprexa 5 mg at bedtime.  Pain is well-controlled today.  Not using further IV pain medications. Dexamethasone 4 mg daily, will discharge on current regimen with plan to taper by oncology clinic. This will be discharge regimen along  with naloxone.  Malignant ascites  -Underwent paracentesis on 2/3, 2/6,  2/10, 2/16, 2/23.   Continue supportive care.   -Continue spironolactone.  Repeat ultrasound on 2/27 with not enough fluid.  Clinically  euvolemic. -Will discontinue Lasix but continue Aldactone to avoid dehydration.  Pulmonary embolism -Asymptomatic.  CT chest done with contrast for staging purpose showed small embolus on the proximal right lower lobe pulmonary artery.  Currently on full dose Lovenox.   Change to Eliquis today, does not need loading as she is adequately anticoagulated for 4 weeks.  Discharge planning on Eliquis.  Pancytopenia -In setting of chemotherapy -Adequate now.  Patient received Granix.   Microcytic anemia/iron deficiency/folic acid deficiency -Continue iron supplement, folic acid supplement. S/p 1 unit PRBC transfusion on 2/16   Moderate protein calorie malnutrition:  Hypoalbuminemia:  -Treated with TPN.  Currently adequate oral intake.  Discontinue TPN.  Discontinue PICC line.    Hypertension -Norvasc, Aldactone and losartan.  Stable.  Right middle finger tip infection: Lanced with a needle.  No need for antibiotics.  DVT prophylaxis: Lovenox  Code Status: Full Family Communication: Sister at the bedside. Disposition Plan:   Status is: Inpatient Remains inpatient appropriate because: Receiving inpatient chemotherapy.  Antimicrobials:  Anti-infectives (From admission, onward)    None        Objective: Vitals:   11/15/22 0641 11/15/22 1337 11/15/22 2146 11/16/22 0612  BP: 121/73 128/76 (!) 142/88 127/86  Pulse: 85 94 79 88  Resp: '18 20 17 18  '$ Temp: 97.9 F (36.6 C) 98 F (36.7 C) 98 F (36.7 C) 98 F (36.7 C)  TempSrc: Oral Oral Oral Oral  SpO2: 97% 96% 97% 97%  Weight:      Height:       No intake or output data in the 24 hours ending 11/16/22 1117  Filed Weights   11/08/22 0940 11/09/22 0500 11/14/22 0701  Weight: 70.3 kg 69.8 kg 69.8 kg    Examination:  General exam: Appears calm, uncomfortable.  On room air. Frail, debilitated.  Not in any distress.  Able to get up and walk around. Respiratory system: Clear to auscultation.  No added sounds. Abdomen:  Soft.  Nontender.  Slightly protuberant.  Bowel sound present. Legs without edema or cyanosis.  CBC: Recent Labs  Lab 11/10/22 0636 11/11/22 0500 11/12/22 0500 11/14/22 0816  WBC 1.1* 3.2* 17.3* 18.2*  NEUTROABS  --  1.2*  --  13.1*  HGB 8.2* 7.9* 7.9* 8.3*  HCT 27.1* 25.8* 25.7* 26.9*  MCV 76.8* 77.2* 76.7* 78.2*  PLT 230 231 340 [id number]   Basic Metabolic Panel: Recent Labs  Lab 11/10/22 0636 11/11/22 0500 11/12/22 0500 11/13/22 0500 11/15/22 0640  NA 130* 131* 133* 133* 130*  K 3.7 3.6 4.1 3.9 4.1  CL 98 99 101 101 97*  CO2 '25 25 25 27 26  '$ GLUCOSE 152* 155* 184* 156* 225*  BUN 21* 22* '19 16 16  '$ CREATININE 0.48 0.48 0.44 0.46 0.44  CALCIUM 7.5* 7.6* 7.8* 7.6* 8.0*  MG 1.8 2.2 1.7 1.9 2.1  PHOS 3.0  --  2.9 3.5 3.9   GFR: Estimated Creatinine Clearance: 73 mL/min (by C-G formula based on SCr of 0.44 mg/dL). Liver Function Tests: Recent Labs  Lab 11/12/22 0500 11/15/22 0640  AST 28 18  ALT 52* 44  ALKPHOS 87 98  BILITOT 0.4 0.4  PROT 4.5* 5.2*  ALBUMIN <1.5* 1.8*   No results for input(s): "LIPASE", "AMYLASE" in the last 168 hours. No results for  input(s): "AMMONIA" in the last 168 hours. Coagulation Profile: No results for input(s): "INR", "PROTIME" in the last 168 hours. Cardiac Enzymes: No results for input(s): "CKTOTAL", "CKMB", "CKMBINDEX", "TROPONINI" in the last 168 hours. BNP (last 3 results) No results for input(s): "PROBNP" in the last 8760 hours. HbA1C: No results for input(s): "HGBA1C" in the last 72 hours. CBG: Recent Labs  Lab 11/15/22 0654 11/15/22 1156 11/15/22 1623 11/15/22 2145 11/16/22 0746  GLUCAP 217* 258* 166* 207* 160*   Lipid Profile: Recent Labs    11/15/22 0538  TRIG 486*   Thyroid Function Tests: No results for input(s): "TSH", "T4TOTAL", "FREET4", "T3FREE", "THYROIDAB" in the last 72 hours. Anemia Panel: No results for input(s): "VITAMINB12", "FOLATE", "FERRITIN", "TIBC", "IRON", "RETICCTPCT" in the last 72  hours. Sepsis Labs: No results for input(s): "PROCALCITON", "LATICACIDVEN" in the last 168 hours.  No results found for this or any previous visit (from the past 240 hour(s)).    Radiology Studies: No results found.    Scheduled Meds:  amLODipine  10 mg Oral Daily   Chlorhexidine Gluconate Cloth  6 each Topical Daily   clonazepam  0.25 mg Oral BID   dexamethasone  4 mg Oral Daily   enoxaparin (LOVENOX) injection  1 mg/kg Subcutaneous Q12H   fentaNYL  1 patch Transdermal Q72H   ferrous sulfate  325 mg Oral Q breakfast   fluorouracil (ADRUCIL) 4,050 mg in dextrose 5 % 1,000 mL chemo infusion  2,400 mg/m2 (Treatment Plan Recorded) Intravenous Once   folic acid  1 mg Oral Daily   insulin aspart  0-15 Units Subcutaneous TID WC   insulin aspart  0-5 Units Subcutaneous QHS   loratadine  10 mg Oral Daily   losartan  50 mg Oral Daily   multivitamin with minerals  1 tablet Oral Daily   OLANZapine zydis  5 mg Oral QHS   pantoprazole (PROTONIX) IV  40 mg Intravenous Q24H   polyethylene glycol  17 g Oral Daily   pregabalin  50 mg Oral QHS   senna-docusate  2 tablet Oral QHS   sodium chloride flush  10-40 mL Intracatheter Q12H   spironolactone  25 mg Oral Daily   Continuous Infusions:     LOS: 27 days   Time spent:  35 minutes

## 2022-11-16 NOTE — Progress Notes (Addendum)
ANTICOAGULATION CONSULT NOTE - Follow Up Consult  Pharmacy Consult for Apixaban  Indication: pulmonary embolus  Allergies  Allergen Reactions   Diphenhydramine Other (See Comments)    Causes spasms and keeps alert for days   Lorazepam Other (See Comments)    After a few doses it made her feel paranoid    Patient Measurements: Height: '4\' 11"'$  (149.9 cm) Weight: 69.8 kg (153 lb 14.1 oz) IBW/kg (Calculated) : 43.2 Heparin Dosing Weight:   Vital Signs: Temp: 98 F (36.7 C) (03/01 0612) Temp Source: Oral (03/01 0612) BP: 127/86 (03/01 0612) Pulse Rate: 88 (03/01 0612)  Labs: Recent Labs    11/14/22 0816 11/15/22 0640  HGB 8.3*  --   HCT 26.9*  --   PLT 346  --   CREATININE  --  0.44    Estimated Creatinine Clearance: 73 mL/min (by C-G formula based on SCr of 0.44 mg/dL).   Medications:  Scheduled:   amLODipine  10 mg Oral Daily   Chlorhexidine Gluconate Cloth  6 each Topical Daily   clonazepam  0.25 mg Oral BID   dexamethasone  4 mg Oral Daily   enoxaparin (LOVENOX) injection  1 mg/kg Subcutaneous Q12H   fentaNYL  1 patch Transdermal Q72H   ferrous sulfate  325 mg Oral Q breakfast   fluorouracil (ADRUCIL) 4,050 mg in dextrose 5 % 1,000 mL chemo infusion  2,400 mg/m2 (Treatment Plan Recorded) Intravenous Once   folic acid  1 mg Oral Daily   insulin aspart  0-15 Units Subcutaneous TID WC   insulin aspart  0-5 Units Subcutaneous QHS   loratadine  10 mg Oral Daily   losartan  50 mg Oral Daily   multivitamin with minerals  1 tablet Oral Daily   OLANZapine zydis  5 mg Oral QHS   pantoprazole (PROTONIX) IV  40 mg Intravenous Q24H   polyethylene glycol  17 g Oral Daily   pregabalin  50 mg Oral QHS   senna-docusate  2 tablet Oral QHS   sodium chloride flush  10-40 mL Intracatheter Q12H   spironolactone  25 mg Oral Daily    Assessment: 89 yoF admitted on 2/2 with new finding of metastatic peritoneal carcinomatosis with ascites. CT chest on 2/8 showed incidental  finding of small PE. Pharmacy was initially consulted to dose IV heparin, transitioned to Lovenox on 2/16, and now pharmacy is consulted to transition to apixaban on 3/1.    No prior to admission anticoagulation   Today, 11/16/2022: CBC (last on 2/28): Hgb low/improved at 8.3, Plt WNL No bleeding or complications reported. RN/MD endorse adequate PO intake and tolerance of PO medications.   Goal of Therapy:  Monitor platelets by anticoagulation protocol: Yes   Plan:  D/c Lovneox Apixaban '5mg'$  PO BID. (Omit loading dose due to prolonged Heparin/Lovenox anticoagulation) Pharmacy to provide education and coupon prior to discharge. [facility] outpatient pharmacy will fill 1st month supply and deliver to patient prior to discharge.    Gretta Arab PharmD, BCPS [facility] main pharmacy [phone] 11/16/2022 10:54 AM

## 2022-11-16 NOTE — Progress Notes (Signed)
Chaplain followed up with Christina [name] and met a second sister, whom she lives with.  Chaplain provided reflective listening as they shared about their family, caring for their children and how Christina [name] plans to spend her time while she is undergoing treatments. 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 They both became very excited and felt positive making these plans and how they would involve their children. Christina [name] is very much looking forward to going home, hopefully tomorrow, and her spirits seemed lifted.  Chaplain provided social and emotional support.  [address] [place] Pager, [phone]

## 2022-11-16 NOTE — Progress Notes (Signed)
Daily Progress Note   Patient Name: Christina [name]       Date: 11/16/2022 DOB: Jul 24, 1976  Age: 47 y.o. MRN#: [id number] Attending Physician: Barb Merino, MD Primary Care Physician: Christina Sacramento, MD Admit Date: 10/19/2022 Length of Stay: 27 days  Reason for Consultation/Follow-up: Establishing goals of care and symptom management  Subjective:   CC: Patient having abdominal cramping at this time though feels it is secondary to eating solid food.  Following up regarding symptom management and complex medical decision making.  Subjective:  Reviewed EMR prior to seeing patient.  In the past 24 hours patient has received po dilaudid 2 mg x1 and not required any IV Dilaudid.  Patient has continued olanzapine 5 mg nightly since 11/12/2022.  Patient's fentanyl patch was increased to 61mg on 11/13/22.  Patient is off of TPN at this time.  Presented to bedside to check on patient.  Patient's friend present while visiting.  Patient gave permission to discuss care at that time.  Patient's only symptom concern at this time is abdominal cramping.  Patient attributes this to her eating solid food.  Patient was able to eat half a cheeseburger last night.  Patient able to tolerate an omelette and cottage cheese this morning.  Patient notes diarrhea has still improved.  Denies symptoms of constipation.    Patient also notes pain remains controlled with fentanyl patch.  Patient only needed 1 dose of 2 mg p.o. Dilaudid yesterday.  Patient felt the 2 mg dose managed her symptoms without adverse effects.  Patient noted plan is to hopefully discharge tomorrow.  Discussed plans for supporting care moving forward.  Noted have [place] [facility] [place] [facility]   With patient's permission, also able to discuss insight into medical decisions.  Patient was able to state that if she is unable to make medical decisions for herself, she would want her  Sister, KGerrit [name] to make medical decisions for her.  Patient noted she is already planned for completion of POA/HCPOA paperwork next week outside of the hospital.  Acknowledged this and importance of completion and continued conversations with family about her goals for medical care moving forward.  All questions answered at that time.  Noted palliative medicine team will continue to follow along with patient's care.  Review of Systems Improved appetite and pain control Objective:   Vital Signs:  BP 127/86 (BP Location: Left Arm)   Pulse 88   Temp 98 F (36.7 C) (Oral)   Resp 18   Ht '4\' 11"'$  (1.499 m)   Wt 69.8 kg   LMP 10/12/2022   SpO2 97%   BMI 31.08 kg/m   Physical Exam: General: NAD, alert, pleasant, laying in bed Eyes: No drainage noted HENT: moist mucous membranes Cardiovascular: RRR Respiratory: no increased work of breathing noted, not in respiratory distress Abdomen: distended Extremities: Moving all appropriately Skin: no rashes or lesions on visible skin Neuro: A&Ox4, following commands easily Psych: appropriately answers all questions  Imaging:  I personally reviewed recent imaging.   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  Palliative medicine team consulted to  assist with symptom management.    Recommendations/Plan: # Complex medical decision making/goals of care:  -Continuing with full scope of care at this time.  Patient's plan is to continue follow-up for cancer therapy in [place].  Discussed with [facility] outpatient palliative provider.  Will  place referral to [facility] [place] to assist with outpatient palliative medicine support in [place].  -Patient easily expressed continued to hope for a "miracle" and much more time with her children.    -Stated that if she was unable to make medical decisions for herself, she would want her Sister Christina [name], to make medical decisions for her.  Patient plans to complete POA and HCPOA paperwork next week when out of the hospital.  -  Code Status: Full Code  # Symptom management:    - Pain, Acute on chronic in the setting of malignant ascites with metastatic adenocarcinoma to peritoneum with large cystic lesion in pancreatic tail and bilateral ovaries                -Continue  Fentanyl patch to 75 mcg Q 72 hours.                 -Continue hydromorphone IV '1mg'$  q4hrs prn for breakthrough pain secondary to oral medications  -Continue hydromorphone po 2-'4mg'$  q3 hrs prn                -Starting po dexamethasone 4 mg on 3/1.  Would decrease by 1 mg every 7 days.  Could more rapidly taper if needed.  -Continue Lyrica po '50mg'$  qhs           -Discontinued flexeril as patient not requiring, does not improve her type of pain, and can interact with multiple medications patient receiving. Please do not restart.    Nausea/Vomiting, acute in setting of malignant ascites, receiving chemotherapy  EKG on 11/08/22 noted Qtc of 419.  Patient feels nausea/vomiting resolved at this time.    -Continue compazine prn   -Continue olanzapine '5mg'$  qhs (started 2/26)                  Anxiety, in setting of recent cancer diagnosis                -Continue Klonopin 0.'25mg'$  BID                  -Diarrhea   Patient initially had constipation on admission in setting of metastatic adenocarcinoma to peritoneum with malignant ascites. Was initially on Miralax 17gm scheduled 3 times daily by GI.   -Continue scheduled Miralax to 17gm once daily.    -Would only consider addition of Loperamide 2gm daily prn if diarrhea  worsens. Currently patient notes improvement of diarrhea symptoms.   # Psychosocial Support:  -Sister, 3 children (75,8,14 yo)  # Discharge Planning: Home. Referral already ordered for palliative medicine follow up with [facility] since patient will be receiving care in [place].   Discussed with: patient, bedside RN  Thank you for allowing the palliative care team to participate in the care [name].  Chelsea Aus, DO Palliative Care Provider PMT # [phone]  If patient remains symptomatic despite maximum doses, please call PMT at [phone] between 0700 and 1900. Outside of these hours, please call attending, as PMT does not have night coverage.

## 2022-11-17 LAB — CBC WITH DIFFERENTIAL/PLATELET
Abs Immature Granulocytes: 0.05 10*3/uL (ref 0.00–0.07)
Basophils Absolute: 0 10*3/uL (ref 0.0–0.1)
Basophils Relative: 0 %
Eosinophils Absolute: 0 10*3/uL (ref 0.0–0.5)
Eosinophils Relative: 0 %
HCT: 30.7 % — ABNORMAL LOW (ref 36.0–46.0)
Hemoglobin: 9.4 g/dL — ABNORMAL LOW (ref 12.0–15.0)
Immature Granulocytes: 1 %
Lymphocytes Relative: 10 %
Lymphs Abs: 0.8 10*3/uL (ref 0.7–4.0)
MCH: 23.1 pg — ABNORMAL LOW (ref 26.0–34.0)
MCHC: 30.6 g/dL (ref 30.0–36.0)
MCV: 75.4 fL — ABNORMAL LOW (ref 80.0–100.0)
Monocytes Absolute: 0.1 10*3/uL (ref 0.1–1.0)
Monocytes Relative: 1 %
Neutro Abs: 6.8 10*3/uL (ref 1.7–7.7)
Neutrophils Relative %: 88 %
Platelets: 330 10*3/uL (ref 150–400)
RBC: 4.07 MIL/uL (ref 3.87–5.11)
RDW: 21.6 % — ABNORMAL HIGH (ref 11.5–15.5)
WBC: 7.7 10*3/uL (ref 4.0–10.5)
nRBC: 0 % (ref 0.0–0.2)

## 2022-11-17 LAB — COMPREHENSIVE METABOLIC PANEL
ALT: 40 U/L (ref 0–44)
AST: 16 U/L (ref 15–41)
Albumin: 1.7 g/dL — ABNORMAL LOW (ref 3.5–5.0)
Alkaline Phosphatase: 91 U/L (ref 38–126)
Anion gap: 10 (ref 5–15)
BUN: 24 mg/dL — ABNORMAL HIGH (ref 6–20)
CO2: 27 mmol/L (ref 22–32)
Calcium: 7.8 mg/dL — ABNORMAL LOW (ref 8.9–10.3)
Chloride: 93 mmol/L — ABNORMAL LOW (ref 98–111)
Creatinine, Ser: 0.58 mg/dL (ref 0.44–1.00)
GFR, Estimated: >60 mL/min (ref >60)
Glucose, Bld: 179 mg/dL — ABNORMAL HIGH (ref 70–99)
Potassium: 3.9 mmol/L (ref 3.5–5.1)
Sodium: 130 mmol/L — ABNORMAL LOW (ref 135–145)
Total Bilirubin: 0.2 mg/dL — ABNORMAL LOW (ref 0.3–1.2)
Total Protein: 5.1 g/dL — ABNORMAL LOW (ref 6.5–8.1)

## 2022-11-17 LAB — GLUCOSE, CAPILLARY: Glucose-Capillary: 181 mg/dL — ABNORMAL HIGH (ref 70–99)

## 2022-11-17 LAB — MAGNESIUM: Magnesium: 2.1 mg/dL (ref 1.7–2.4)

## 2022-11-17 LAB — PHOSPHORUS: Phosphorus: 4.9 mg/dL — ABNORMAL HIGH (ref 2.5–4.6)

## 2022-11-17 MED ORDER — ONDANSETRON 4 MG PO TBDP: 4.0000 mg | ORAL_TABLET | Freq: Three times a day (TID) | ORAL | 0 refills | Status: AC | PRN

## 2022-11-17 MED ORDER — ONDANSETRON 4 MG PO TBDP
4.0000 mg | ORAL_TABLET | Freq: Three times a day (TID) | ORAL | Status: DC | PRN
  Administered 2022-11-17 (×2): 4 mg via ORAL
  Filled 2022-11-17 (×2): qty 1

## 2022-11-17 MED ORDER — PANTOPRAZOLE SODIUM 40 MG PO TBEC
40.0000 mg | DELAYED_RELEASE_TABLET | Freq: Every day | ORAL | Status: DC
  Administered 2022-11-17: 40 mg via ORAL
  Filled 2022-11-17: qty 1

## 2022-11-17 MED ORDER — PROCHLORPERAZINE MALEATE 10 MG PO TABS: 10.0000 mg | ORAL_TABLET | Freq: Four times a day (QID) | ORAL | 0 refills | Status: DC | PRN

## 2022-11-17 MED ORDER — HEPARIN SOD (PORK) LOCK FLUSH 100 UNIT/ML IV SOLN
500.0000 [IU] | Freq: Once | INTRAVENOUS | Status: AC
  Administered 2022-11-17: 500 [IU] via INTRAVENOUS
  Filled 2022-11-17: qty 5

## 2022-11-17 MED ORDER — PROCHLORPERAZINE MALEATE 10 MG PO TABS
10.0000 mg | ORAL_TABLET | Freq: Four times a day (QID) | ORAL | Status: DC | PRN
  Administered 2022-11-17: 10 mg via ORAL
  Filled 2022-11-17: qty 1

## 2022-11-17 NOTE — Progress Notes (Signed)
PROGRESS NOTE    Christina [name]  [id number] DOB: 1976/07/17 DOA: 10/19/2022 PCP: [name], MD     Brief Narrative:  Christina [name] is a 47 year old female with history of anxiety, HPV, who presented with complaint of abdominal distention, constipation.  CT abdomen/pelvis showed peritoneal carcinomatosis with extensive metastasis, ascites, multiloculated cystic lesion of the pancreatic tail.  GYN oncology consulted initially .  Underwent paracentesis x 3.  Tumor markers were elevated :CA125, CEA.  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  Oncology, GI following.  S/p  EGD, plan for EUS/biopsy as per GI but was  postponed.  Started  on TPN, PCA pump.  Also started on inpatient chemotherapies.  Hospital course complicated by persistent abdominal pain, distention, constipation, poor oral intake.  Palliative care also following for goals of care.  Oncology started inpatient chemotherapy.   Clinically improving.  New events last 24 hours / Subjective:  Patient seen and examined.  Trying to go to bathroom early morning, she tangled her leg on the bedsheet and had ground-level fall.  No other injuries. Overall better but is still got nauseated after eating solid food last night.  She will scale back on soft and liquid diet as well she will eat frequently small portions. Her sister will be home tomorrow and will be able to go home.  Assessment & Plan:   Principal Problem:   Peritoneal carcinomatosis ([place]) Active Problems:   S/P cesarean section   Generalized abdominal pain   Constipation   Ovarian mass   Pancreatic mass   Tobacco abuse   Malignant ascites   Protein-calorie malnutrition (HCC)   Cancer associated pain   High risk medication use   Palliative care encounter   Counseling and coordination of care   Abnormal cervical Papanicolaou smear   Nausea and vomiting   Anxiety   Medication management   Need  for emotional support   Malnutrition of moderate degree   Goals of care, counseling/discussion   Metastatic adenocarcinoma with peritoneal carcinomatosis -Concern of extensive metastatic disease with unknown primary as seen on the CT scan.  CT concerning for bilateral cystic/solid ovarian masses, pancreatic lesion also noted.  CEA, CA125 elevated, CA 19-9 is normal. 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    -GI consulted, status post EGD on 2/9. EGD was negative for malignancy in the esophagus, stomach or duodenum but was found to have significant extrinsic compression  in the gastric antrum. EUS was initially planned however it was canceled later due to abdominal distention. Clinically improved.  EUS was not pursued as patient is already diagnosed and further invasive testing is not going to change management.   -Underwent Port-A-Cath placement on 2/9. Oncology started chemotherapy.   Received 2 cycles of chemotherapy, last 2/28-3/1.  -Endometrial biopsy showed metastatic adenocarcinoma.  Dr. Burr Medico following as well as GYN oncology.  Awaiting further molecular testing at this time   Pain management -Appreciate palliative care team management.   Fentanyl 75 mcg/h patch, Dilaudid 2 mg every 3 hours by mouth as needed, Lyrica 50 mg at night, Zyprexa 5 mg at bedtime.  Pain is well-controlled today.  Dexamethasone 4 mg daily, will discharge on current regimen with plan to taper by oncology clinic. This will be discharge regimen along with naloxone. She will use Zofran before meals.  Malignant ascites  -Underwent paracentesis on 2/3, 2/6,  2/10, 2/16, 2/23.   Continue  supportive care.   -Continue spironolactone.  Repeat ultrasound on 2/27 with not enough fluid.  Clinically euvolemic. -Will discontinue Lasix but continue Aldactone to avoid dehydration.  Pulmonary embolism -Asymptomatic.  CT chest done with contrast for  staging purpose showed small embolus on the proximal right lower lobe pulmonary artery.  Currently on full dose Lovenox.   Changed to Eliquis,  does not need loading as she is adequately anticoagulated for 4 weeks.  Discharge planning on Eliquis.  Pancytopenia -In setting of chemotherapy -Adequate now.  Patient received Granix.   Microcytic anemia/iron deficiency/folic acid deficiency -Continue iron supplement, folic acid supplement. S/p 1 unit PRBC transfusion on 2/16   Moderate protein calorie malnutrition:  Hypoalbuminemia:  -Treated with TPN.  Currently adequate oral intake.  Discontinue TPN.  Discontinue PICC line.    Hypertension -Norvasc, Aldactone and losartan.  Stable.  Right middle finger tip infection: Lanced with a needle.  No need for antibiotics.  Improving.  DVT prophylaxis: Lovenox apixaban (ELIQUIS) tablet 5 mg  Code Status: Full Family Communication: None today. Disposition Plan:   Status is: Inpatient Remains inpatient appropriate because: Nausea.  Diet tolerance.  Antimicrobials:  Anti-infectives (From admission, onward)    None        Objective: Vitals:   11/16/22 0612 11/16/22 1324 11/16/22 2054 11/17/22 0547  BP: 127/86 (!) 131/92 (!) 134/91 (!) 132/90  Pulse: 88 100 (!) 104 (!) 110  Resp: '18 20 16 18  '$ Temp: 98 F (36.7 C) 98.4 F (36.9 C) 98.1 F (36.7 C) 98.1 F (36.7 C)  TempSrc: Oral Oral Oral Oral  SpO2: 97% 97% 95% 95%  Weight:    68.8 kg  Height:        Intake/Output Summary (Last 24 hours) at 11/17/2022 1046 Last data filed at 11/16/2022 1715 Gross per 24 hour  Intake 888.95 ml  Output --  Net 888.95 ml    Filed Weights   11/09/22 0500 11/14/22 0701 11/17/22 0547  Weight: 69.8 kg 69.8 kg 68.8 kg    Examination:   General: Looks comfortable on interview.  On room air. Cardiovascular: S1-S2 normal.  Regular rate rhythm. Respiratory: Bilateral clear.  No added sounds.  Port-A-Cath present on the right  chest. Gastrointestinal: Soft.  Mildly distended but nontender.  Bowel sound present. Ext: No edema or swelling.  No cyanosis. Neuro: Alert and awake.  Oriented x 4.  No focal neurodeficits.   CBC: Recent Labs  Lab 11/11/22 0500 11/12/22 0500 11/14/22 0816 11/17/22 0615  WBC 3.2* 17.3* 18.2* 7.7  NEUTROABS 1.2*  --  13.1* 6.8  HGB 7.9* 7.9* 8.3* 9.4*  HCT 25.8* 25.7* 26.9* 30.7*  MCV 77.2* 76.7* 78.2* 75.4*  PLT 231 340 346 [id number]   Basic Metabolic Panel: Recent Labs  Lab 11/11/22 0500 11/12/22 0500 11/13/22 0500 11/15/22 0640 11/17/22 0615  NA 131* 133* 133* 130* 130*  K 3.6 4.1 3.9 4.1 3.9  CL 99 101 101 97* 93*  CO2 '25 25 27 26 27  '$ GLUCOSE 155* 184* 156* 225* 179*  BUN 22* '19 16 16 '$ 24*  CREATININE 0.48 0.44 0.46 0.44 0.58  CALCIUM 7.6* 7.8* 7.6* 8.0* 7.8*  MG 2.2 1.7 1.9 2.1 2.1  PHOS  --  2.9 3.5 3.9 4.9*   GFR: Estimated Creatinine Clearance: 72.5 mL/min (by C-G formula based on SCr of 0.58 mg/dL). Liver Function Tests: Recent Labs  Lab 11/12/22 0500 11/15/22 0640 11/17/22 0615  AST '28 18 16  '$ ALT 52* 44 40  ALKPHOS 87  98 91  BILITOT 0.4 0.4 0.2*  PROT 4.5* 5.2* 5.1*  ALBUMIN <1.5* 1.8* 1.7*   No results for input(s): "LIPASE", "AMYLASE" in the last 168 hours. No results for input(s): "AMMONIA" in the last 168 hours. Coagulation Profile: No results for input(s): "INR", "PROTIME" in the last 168 hours. Cardiac Enzymes: No results for input(s): "CKTOTAL", "CKMB", "CKMBINDEX", "TROPONINI" in the last 168 hours. BNP (last 3 results) No results for input(s): "PROBNP" in the last 8760 hours. HbA1C: No results for input(s): "HGBA1C" in the last 72 hours. CBG: Recent Labs  Lab 11/16/22 0746 11/16/22 1201 11/16/22 1650 11/16/22 2056 11/17/22 0745  GLUCAP 160* 149* 155* 132* 181*   Lipid Profile: Recent Labs    11/15/22 0538  TRIG 486*   Thyroid Function Tests: No results for input(s): "TSH", "T4TOTAL", "FREET4", "T3FREE", "THYROIDAB" in the  last 72 hours. Anemia Panel: No results for input(s): "VITAMINB12", "FOLATE", "FERRITIN", "TIBC", "IRON", "RETICCTPCT" in the last 72 hours. Sepsis Labs: No results for input(s): "PROCALCITON", "LATICACIDVEN" in the last 168 hours.  No results found for this or any previous visit (from the past 240 hour(s)).    Radiology Studies: No results found.    Scheduled Meds:  amLODipine  10 mg Oral Daily   apixaban  5 mg Oral BID   Chlorhexidine Gluconate Cloth  6 each Topical Daily   clonazepam  0.25 mg Oral BID   dexamethasone  4 mg Oral Daily   fentaNYL  1 patch Transdermal Q72H   ferrous sulfate  325 mg Oral Q breakfast   folic acid  1 mg Oral Daily   loratadine  10 mg Oral Daily   losartan  50 mg Oral Daily   multivitamin with minerals  1 tablet Oral Daily   OLANZapine zydis  5 mg Oral QHS   pantoprazole  40 mg Oral Daily   polyethylene glycol  17 g Oral Daily   pregabalin  50 mg Oral QHS   senna-docusate  2 tablet Oral QHS   sodium chloride flush  10-40 mL Intracatheter Q12H   spironolactone  25 mg Oral Daily   Continuous Infusions:     LOS: 28 days   Time spent:  35 minutes

## 2022-11-17 NOTE — Progress Notes (Signed)
PAC needle due to be changed today. Pt requested we did not reaccess her port, in light of discharge tomorrow. Per pt request and MD, ok to not have IV access .

## 2022-11-17 NOTE — Progress Notes (Signed)
Daily Progress Note   Patient Name: Christina [name]       Date: 11/17/2022 DOB: 04/29/1976  Age: 47 y.o. 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  Palliative medicine team consulted to assist with symptom management.     During this hospitalization, patient has required administration of TPN and management of symptoms including pain, N/V, and diarrhea. TPN has been discontinued due to improvement in appetite and ability to maintain oral intake.  Subjective: Review of patient's medical record and updates from bedside nursing staff before visit.Patient had fall overnight without any reported injury.   Presented to bedside for visit. Ms. Bacharach is observed alert, oriented, and eating yogurt. No visitors at bedside. Patient reports improved appetite and having had her first semi-formed stool within the last 24 hours. Reports that pain is well managed on current regimen.   Education and contact information provided to patient regarding [facility] to assist her in emotional support and engaging in conversations with her three young children (ages 11, 85, and 27) about her disease process. Ms. kaleesi silvan understanding and  appreciation. Contact information provided for PMT. Patient hopeful to discharge tomorrow in AM.   Physical Exam Pulmonary:     Effort: Pulmonary effort is normal.  Abdominal:     General: There is distension.     Comments: Ascites.  Musculoskeletal:     Right lower leg: Edema present.     Left lower leg: Edema present.  Skin:    General: Skin is warm and dry.  Neurological:     Mental Status: She is alert and oriented to person, place, and time.  Psychiatric:        Mood and Affect: Mood normal.             Vital Signs: BP (!) 132/90 (BP Location: Left Arm)   Pulse Marland Kitchen)  110   Temp 98.1 F (36.7 C) (Oral)   Resp 18   Ht '4\' 11"'$  (1.499 m)   Wt 68.8 kg   LMP 10/12/2022   SpO2 95%   BMI 30.63 kg/m  SpO2: SpO2: 95 % O2 Device: O2 Device: Room Air O2 Flow Rate: O2 Flow Rate (L/min): 0 L/min  Intake/output summary:  Intake/Output Summary (Last 24 hours) at 11/17/2022 1306 Last data filed at 11/16/2022 1715 Gross per 24 hour  Intake 888.95 ml  Output --  Net 888.95 ml   LBM: Last BM Date : 11/17/22 Baseline Weight: Weight: 69.4 kg Most recent weight: Weight: 68.8 kg   Patient Active Problem List   Diagnosis Date Noted   Goals of care, counseling/discussion 11/16/2022   Need for emotional support 11/15/2022   Malnutrition of moderate degree 11/15/2022   Medication management 11/14/2022   Nausea and vomiting 11/13/2022   Anxiety 11/13/2022   Abnormal cervical Papanicolaou smear 11/07/2022   Counseling and coordination of care 11/05/2022   Cancer associated pain 10/30/2022   High risk medication use 10/30/2022   Palliative care encounter 10/30/2022   Generalized abdominal pain 10/22/2022   Constipation 10/22/2022   Ovarian mass 10/22/2022   Pancreatic mass 10/22/2022   Tobacco abuse 10/22/2022   Malignant ascites 10/22/2022   Protein-calorie malnutrition ([place]) 10/22/2022   Peritoneal carcinomatosis ([place]) 10/19/2022   Postop check 06/25/2016   Encounter for  sterilization 06/12/2016   Well woman exam with routine gynecological exam 04/30/2016   Encounter for contraceptive management 04/30/2016   S/P cesarean section 07/14/2014    Palliative Care Assessment & Plan   Patient is a 47 year old female with a past medical history (as per EMR review) of tobacco use, growth hormone deficiency, 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  Palliative medicine team consulted to assist with symptom management.     Assessment/Recommendations/Plan   Pain, Acute on chronic in the setting of malignant ascites with metastatic adenocarcinoma to peritoneum with large cystic lesion in pancreatic tail and bilateral ovaries -Continue  Fentanyl patch to 75 mcg Q 72 hours.  -Continue hydromorphone po 2-'4mg'$  q3 hrs prn  -Dexamethasone 4 mg PO started on 3/1.  Would decrease by 1 mg every 7 days. May be tapered more rapidly if needed. -Continue Lyrica po '50mg'$  qhs  -Flexeril discontinued recently as patient not requiring, it does not improve her type of pain, and can interact with multiple medications patient receiving. Please do not restart.    Nausea/Vomiting, acute in setting of malignant ascites, receiving chemotherapy (EKG on 11/08/22 noted Qtc of 419) Patient feels nausea/vomiting resolved at this time.  -Continue compazine PRN -Continue olanzapine '5mg'$  qhs (started 2/26)   Anxiety, in setting of recent cancer diagnosis                -Continue Klonopin 0.'25mg'$  BID   Diarrhea Patient initially had constipation on admission in setting of metastatic adenocarcinoma to peritoneum with malignant ascites. Was initially on Miralax 17gm scheduled 3 times daily by GI.         -Continue scheduled Miralax to 17gm once daily.   -Would only consider addition of Loperamide 2gm daily prn if diarrhea worsens. Patient continues to note improvement of diarrhea symptoms daily.   Psychosocial Support: -Patient has the support of her sister. -Patient  provided with information on [facility] to aid her in emotional support and difficult conversations with her three young children.   Complex medical decision making/goals of care: -Continuing with full scope of care at this time.  Patient's plan is to continue follow-up for cancer therapy in [place]. Referral to [facility] [place] to assist with outpatient palliative medicine support in [place]. -Patient hopeful for extra time with her three young children. -Stated desire for [name], to make medical decisions in event she is unable to speak for herself. Patient plans to complete POA and HCPOA paperwork next week when out of the hospital.  Code Status: Full Code  Discharge Planning: Home. Referral ordered for palliative medicine follow up with [facility] since patient will be receiving care in [place].   Care plan was discussed with patient.  Thank you for allowing the palliative care team to participate in the care of Ms. Hoyt Koch.   Moss Mc, RN, MSN, [facility] / NP Student Palliative Medicine   If patient remains symptomatic despite maximum doses, please call PMT at [phone] between 0700 and 1900. Outside of these hours, please call attending, as PMT does not have night coverage.   I have reviewed, seen, examined, and discussed the care of this patient in detail with Moss Mc, RN, MSN, [facility] / NP Student  including pertinent patient records, physical exam findings, and data. I have updated the note accordingly and agree with details of the encounter stated in above note(GC).   This provider spent a total of 36 minutes providing patient's care.  Includes review of EMR, discussing care with other staff members  involved in patient's medical care, obtaining relevant history and information from patient and/or patient's family, and personal review of imaging and lab work. Greater than 50% of the time was spent counseling and coordinating care related to the above assessment and plan.    Chelsea Aus, DO Palliative Care Provider [phone]

## 2022-11-17 NOTE — Progress Notes (Signed)
       CROSS COVER NOTE  NAME: Christina [name] MRN: [id number] DOB : 1976/05/06    Date of Service   11/17/2022     HPI/Events of Note   Unwitnessed fall reported.    Patient denies hitting head.  No pain reported.  Range of motion intact.  Patient was able to get up by herself.  RN reports a small skin tear on right knee.  No other injuries reported.   Interventions/ Plan   Assist with mobility        Raenette Rover, DNP, [place]

## 2022-11-17 NOTE — Progress Notes (Signed)
   11/17/22 0538  What Happened  Was fall witnessed? No  Was patient injured? Yes (skin tear on R knee)  Patient found other (Comment) (pt called out and said she fell - was found in bed)  Found by No one-pt stated they fell  Stated prior activity to/from bed, chair, or stretcher (pt independent - pt getting up to put socks on and foot got tangled in the bedsheet)  Provider Notification  Provider Name/Title Raenette Rover, NP  Date Provider Notified 11/17/22  Time Provider Notified [phone]  Method of Notification Page (secure chat)  Notification Reason Fall  Provider response No new orders  Date of Provider Response 11/17/22  Time of Provider Response 0540  Follow Up  Family notified Yes - comment Vida Roller, Sister)  Time family notified [phone]  Additional tests No  Simple treatment Dressing (Mepilex on R knee)  Progress note created (see row info) Yes  Adult Fall Risk Assessment  Risk Factor Category (scoring not indicated) Fall has occurred during this admission (document High fall risk)  Age 47  Fall History: Fall within 6 months prior to admission 0  Elimination; Bowel and/or Urine Incontinence 0  Elimination; Bowel and/or Urine Urgency/Frequency 0  Medications: includes PCA/Opiates, Anti-convulsants, Anti-hypertensives, Diuretics, Hypnotics, Laxatives, Sedatives, and Psychotropics 5  Patient Care Equipment 0  Mobility-Assistance 0  Mobility-Gait 0  Mobility-Sensory Deficit 0  Altered awareness of immediate physical environment 0  Impulsiveness 0  Lack of understanding of one's physical/cognitive limitations 0  Total Score 5  Patient Fall Risk Level High fall risk  Adult Fall Risk Interventions  Required Bundle Interventions *See Row Information* Moderate fall risk - low and moderate requirements implemented  Additional Interventions Use of appropriate toileting equipment (bedpan, BSC, etc.)  Integumentary  Integumentary (WDL) X  Skin Integrity Abrasion  Abrasion Location  Knee  Abrasion Location Orientation Right  Abrasion Intervention Foam   This RN was sitting at desk and answered the call bell - pt stated she fell. This RN went into pts room to find her sitting in her bed. Pt stated she was getting up to put on her socks and her feet got tangled in her sheets. Only injury noted / stated by pt was a small skin tear on R knee cap. No issues with ROM. Mepilex foam placed. Family member, Vida Roller, notified per policy and family request. Overnight provider notified as well. Post fall huddle conducted with Agricultural consultant (this RN), primary nurse, Marissa, RN, NT and other RN on the floor. Safety Zone filled out per policy. Plan of care continues.

## 2022-11-18 MED ORDER — POLYETHYLENE GLYCOL 3350 17 G PO PACK: 17.0000 g | PACK | Freq: Every day | ORAL | 0 refills | Status: AC

## 2022-11-18 MED ORDER — CLONAZEPAM 0.5 MG PO TABS: 0.5000 mg | ORAL_TABLET | Freq: Two times a day (BID) | ORAL | 0 refills | Status: AC

## 2022-11-18 NOTE — Discharge Summary (Signed)
Physician Discharge Summary  Christina [name] [id number] DOB: 21-Sep-1975 DOA: 10/19/2022  PCP: [name], MD  Admit date: 10/19/2022 Discharge date: 11/18/2022  Admitted From: Home Disposition: Home  Recommendations for Outpatient Follow-up:  Follow-up with oncology clinic as scheduled  Home Health: N/A Equipment/Devices: N/A  Discharge Condition: Stable CODE STATUS: Full code Diet recommendation: Regular diet, nutritional supplements  Discharge summary: Patient is going home today after 1 month of complicated hospitalization as below. Christina [name] is a 47 year old female with history of anxiety, HPV, who presented with complaint of abdominal distention, constipation.  CT abdomen/pelvis showed peritoneal carcinomatosis with extensive metastasis, ascites, multiloculated cystic lesion of the pancreatic tail.  GYN oncology consulted initially .  Underwent paracentesis x 3.  Tumor markers were elevated :CA125, CEA.  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  Oncology, GI following.  S/p  EGD. Started  on TPN, PCA pump.  Also started on inpatient chemotherapies.   Hospital course complicated by persistent abdominal pain, distention, constipation, poor oral intake.  Palliative care was also following for goals of care.  Oncology started inpatient chemotherapy.   Clinically improving and able to go home today.   Metastatic adenocarcinoma with peritoneal carcinomatosis, new diagnosis. -Concern of extensive metastatic disease with unknown primary as seen on the CT scan.  CT concerning for bilateral cystic/solid ovarian masses, pancreatic lesion also noted.  CEA, CA125 elevated, CA 19-9 is normal. 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     -GI consulted, status post EGD on 2/9. EGD was negative for malignancy in the esophagus, stomach or duodenum but was  found to have significant extrinsic compression  in the gastric antrum. EUS was initially planned however it was canceled later due to abdominal distention. Clinically improved.  EUS was not pursued as patient is already diagnosed and further invasive testing is not going to change management.    -Underwent Port-A-Cath placement on 2/9. Oncology started chemotherapy.    Received 2 cycles of chemotherapy, last 2/28-3/1.   -Endometrial biopsy showed metastatic adenocarcinoma.  Dr. Burr Medico following as well as GYN oncology.     Pain management -Appreciate palliative care team management.   Fentanyl 75 mcg/h patch, Dilaudid 2 mg every 3 hours by mouth as needed, Lyrica 50 mg at night, Zyprexa 5 mg at bedtime.  Pain is well-controlled today.  Dexamethasone 4 mg daily, will discharge on current regimen with plan to taper by oncology clinic. She will use Zofran before meals. Naloxone also prescribed to keep at home.   Malignant ascites  -Underwent paracentesis on 2/3, 2/6,  2/10, 2/16, 2/23.   Continue supportive care.   -Continue spironolactone.  Repeat ultrasound on 2/27 with not enough fluid.  Clinically euvolemic. -Will discontinue Lasix but continue Aldactone to avoid dehydration.   Pulmonary embolism -Asymptomatic.  CT chest done with contrast for staging purpose showed small embolus on the proximal right lower lobe pulmonary artery.  Currently on full dose Lovenox.   Changed to Eliquis,  does not need loading as she is adequately anticoagulated for 4 weeks.  Discharge planning on Eliquis.   Pancytopenia -In setting of chemotherapy -Adequate now.  Patient received Granix.   Microcytic anemia/iron deficiency/folic acid deficiency -Continue iron supplement, folic acid supplement. S/p 1 unit PRBC transfusion on 2/16   Moderate protein calorie malnutrition:  Hypoalbuminemia:  -Treated with TPN.  Currently with adequate oral intake.    Hypertension, new diagnosis. -  Norvasc, Aldactone and  losartan.  Stable.   Medically stable to discharge home today.   Discharge Diagnoses:  Principal Problem:   Peritoneal carcinomatosis ([place]) Active Problems:   S/P cesarean section   Generalized abdominal pain   Constipation   Ovarian mass   Pancreatic mass   Tobacco abuse   Malignant ascites   Protein-calorie malnutrition ([facility])   Cancer associated pain   High risk medication use   Palliative care encounter   Counseling and coordination of care   Abnormal cervical Papanicolaou smear   Nausea and vomiting   Anxiety   Medication management   Need for emotional support   Malnutrition of moderate degree   Goals of care, counseling/discussion    Discharge Instructions  Discharge Instructions     IS pump d/c appointment request (30 min)   Complete by: Nov 16, 2022    Contact your oncology clinic or infusion center to schedule this appointment.   Amb Referral to Palliative Care   Complete by: As directed    Call MD for:  persistant nausea and vomiting   Complete by: As directed    Call MD for:  severe uncontrolled pain   Complete by: As directed    Diet - low sodium heart healthy   Complete by: As directed    Increase activity slowly   Complete by: As directed    No wound care   Complete by: As directed    TREATMENT CONDITIONS   Complete by: As directed    Patient should have CBC & CMP within 7 days prior to chemotherapy administration. NOTIFY MD IF: ANC < 1500, Hemoglobin < 8, PLT < 100,000,  Total Bili > 1.5, Creatinine > 1.5, ALT or  AST > 80 or if patient has unstable vital signs: Temperature >= 100.58F, SBP > 180 or < 90, RR > 30 or HR > 100.   TREATMENT CONDITIONS   Complete by: As directed    Patient should have CBC & CMP within 7 days prior to chemotherapy administration. NOTIFY MD IF: ANC < 1500, Hemoglobin < 8, PLT < 100,000,  Total Bili > 1.5, Creatinine > 1.5, ALT or  AST > 80 or if patient has unstable vital signs: Temperature >= 100.58F, SBP > 180 or < 90,  RR > 30 or HR > 100.      Allergies as of 11/18/2022       Reactions   Diphenhydramine Other (See Comments)   Causes spasms and keeps alert for days   Lorazepam Other (See Comments)   After a few doses it made her feel paranoid        Medication List     STOP taking these medications    dicyclomine 20 MG tablet Commonly known as: BENTYL   ICY HOT EX   OVER THE COUNTER MEDICATION       TAKE these medications    acetaminophen 500 MG tablet Commonly known as: TYLENOL Take 500-1,000 mg by mouth 3 (three) times daily as needed for mild pain or moderate pain.   amLODipine 10 MG tablet Commonly known as: NORVASC Take 1 tablet (10 mg total) by mouth daily.   apixaban 5 MG Tabs tablet Commonly known as: ELIQUIS Take 1 tablet (5 mg total) by mouth 2 (two) times daily.   clonazePAM 0.5 MG tablet Commonly known as: KlonoPIN Take 1 tablet (0.5 mg total) by mouth 2 (two) times daily for 14 days.   dexamethasone 4 MG tablet Commonly known as: DECADRON  Take 1 tablet (4 mg total) by mouth daily for 14 days.   fentaNYL 75 MCG/HR Commonly known as: [place] 1 patch onto the skin every 3 (three) days.   ferrous sulfate 325 (65 FE) MG tablet Take 1 tablet (325 mg total) by mouth daily with breakfast.   FLEET ENEMA RE Place 1 Dose rectally as needed (consttipation).   folic acid 1 MG tablet Commonly known as: FOLVITE Take 1 tablet (1 mg total) by mouth daily.   GAS-X PO Take 2 tablets by mouth as needed (bloat).   HYDROmorphone 2 MG tablet Commonly known as: DILAUDID Take 1 tablet (2 mg total) by mouth every 3 (three) hours as needed for up to 5 days for severe pain or moderate pain.   loratadine 10 MG tablet Commonly known as: CLARITIN Take 1 tablet (10 mg total) by mouth daily.   losartan 50 MG tablet Commonly known as: COZAAR Take 1 tablet (50 mg total) by mouth daily.   multivitamin with minerals Tabs tablet Take 1 tablet by mouth daily.    naloxone 0.4 MG/ML injection Commonly known as: NARCAN Inject 1 mL (0.4 mg total) into the vein as needed.   OLANZapine zydis 5 MG disintegrating tablet Commonly known as: ZYPREXA Take 1 tablet (5 mg total) by mouth at bedtime.   ondansetron 4 MG disintegrating tablet Commonly known as: ZOFRAN-ODT Take 1 tablet (4 mg total) by mouth every 8 (eight) hours as needed for nausea or vomiting.   polyethylene glycol 17 g packet Commonly known as: MIRALAX / GLYCOLAX Take 17 g by mouth daily as needed for moderate constipation or severe constipation. What changed: Another medication with the same name was added. Make sure you understand how and when to take each.   polyethylene glycol 17 g packet Commonly known as: MIRALAX / GLYCOLAX Take 17 g by mouth daily. What changed: You were already taking a medication with the same name, and this prescription was added. Make sure you understand how and when to take each.   pregabalin 50 MG capsule Commonly known as: LYRICA Take 1 capsule (50 mg total) by mouth at bedtime.   prochlorperazine 10 MG tablet Commonly known as: COMPAZINE Take 1 tablet (10 mg total) by mouth every 6 (six) hours as needed for refractory nausea / vomiting.   senna-docusate 8.6-50 MG tablet Commonly known as: Senokot-S Take 2 tablets by mouth at bedtime.   spironolactone 25 MG tablet Commonly known as: ALDACTONE Take 1 tablet (25 mg total) by mouth daily.        Allergies  Allergen Reactions   Diphenhydramine Other (See Comments)    Causes spasms and keeps alert for days   Lorazepam Other (See Comments)    After a few doses it made her feel paranoid    Consultations: Oncology Gynecology oncology GI Palliative care   Procedures/Studies: [place] ASCITES (ABDOMEN LIMITED)  Result Date: 11/13/2022 CLINICAL DATA:  [id number] Malignant ascites [id number] EXAM: LIMITED ABDOMEN ULTRASOUND FOR ASCITES TECHNIQUE: Limited ultrasound survey for ascites was performed in all  four abdominal quadrants. COMPARISON:  IR ultrasound paracentesis, most recently 11/09/2022. CT AP, 10/28/2022. FINDINGS: Focused ultrasound at the area of concern, along the abdominal quadrants for ascites. Small volume of intra-abdominal free fluid without a significant pocket for safe paracentesis. Procedure was aborted. Performed by Rushie Nyhan, IR NP IMPRESSION: Small volume of abdominal ascites. Planned ultrasound paracentesis was deferred. Michaelle Birks, MD [facility] Radiology Electronically Signed   By: Wille Glaser  Mugweru M.D.   On: 11/13/2022 14:50   US Paracentesis  Result Date: 11/09/2022 INDICATION: Patient with metastatic adenocarcinoma with peritoneal carcinomatosis, recurrent ascites. Request made for therapeutic paracentesis. EXAM: ULTRASOUND GUIDED THERAPEUTIC PARACENTESIS MEDICATIONS: 10 mL 1% lidocaine COMPLICATIONS: None immediate. PROCEDURE: Informed written consent was obtained from the patient after a discussion of the risks, benefits and alternatives to treatment. A timeout was performed prior to the initiation of the procedure. Initial ultrasound scanning demonstrates a moderate amount of ascites within the right lower abdominal quadrant. The right lower abdomen was prepped and draped in the usual sterile fashion. 1% lidocaine was used for local anesthesia. Following this, a 19 gauge, 7-cm, Yueh catheter was introduced. An ultrasound image was saved for documentation purposes. The paracentesis was performed. The catheter was removed and a dressing was applied. The patient tolerated the procedure well without immediate post procedural complication. FINDINGS: A total of approximately 3.1 liters of yellow fluid was removed. IMPRESSION: Successful ultrasound-guided paracentesis yielding 3.1 liters of peritoneal fluid. Read by: Brynda Greathouse PA-C Electronically Signed   By: Aletta Edouard M.D.   On: 11/09/2022 16:43   US  Paracentesis  Result Date: 11/02/2022 INDICATION: Patient with history of peritoneal carcinomatosis, adenocarcinoma from likely upper GI /pancreaticobiliary origin, recurrent ascites, pulmonary embolus, anemia; request received for therapeutic paracentesis. EXAM: ULTRASOUND GUIDED THERAPEUTIC PARACENTESIS MEDICATIONS: 8 ml 1% lidocaine COMPLICATIONS: None immediate. PROCEDURE: Informed written consent was obtained from the patient after a discussion of the risks, benefits and alternatives to treatment. A timeout was performed prior to the initiation of the procedure. Initial ultrasound scanning demonstrates a moderate amount of ascites within the right lower abdominal quadrant. The right lower abdomen was prepped and draped in the usual sterile fashion. 1% lidocaine was used for local anesthesia. Following this, a 19 gauge, 7-cm, Yueh catheter was introduced. An ultrasound image was saved for documentation purposes. The paracentesis was performed. The catheter was removed and a dressing was applied. The patient tolerated the procedure well without immediate post procedural complication. FINDINGS: A total of approximately 3.5 liters of yellow fluid was removed. IMPRESSION: Successful ultrasound-guided therapeutic paracentesis yielding 3.5 liters of peritoneal fluid. Read by: Rowe Robert, PA-C Electronically Signed   By: Ruthann Cancer M.D.   On: 11/02/2022 16:46   DG Abd 1 View  Result Date: 11/02/2022 CLINICAL DATA:  Abdominal distension. EXAM: ABDOMEN - 1 VIEW COMPARISON:  10/30/2022 FINDINGS: Persistent mildly distended air-filled small bowel loops with scattered air-fluid levels suggesting small bowel obstruction. No free air. The lung bases are grossly clear. IMPRESSION: Mild small-bowel obstruction bowel gas pattern. Electronically Signed   By: Marijo Sanes M.D.   On: 11/02/2022 12:39   DG Abd 1 View  Result Date: 10/30/2022 CLINICAL DATA:  Nasogastric tube placement EXAM: ABDOMEN - 1 VIEW  COMPARISON:  10/29/2022 FINDINGS: An enteric tube has been placed with tip projecting over the left upper quadrant consistent with location in the body of the stomach. Mild gaseous distention of colon in the upper abdomen. IMPRESSION: Enteric tube tip projects over the left upper quadrant consistent with location in the body of the stomach. Electronically Signed   By: Lucienne Capers M.D.   On: 10/30/2022 01:48   DG Abd 2 Views  Result Date: 10/29/2022 CLINICAL DATA:  Distended abdomen EXAM: ABDOMEN - 2 VIEW COMPARISON:  10/28/2022 FINDINGS: No free air beneath the diaphragm. Persistent moderate gas distension of large and small bowel, small bowel loops distended up to 4 cm. Findings do not appear significantly  changed compared to radiograph several days prior. IMPRESSION: Persistent moderate gas distension of large and small bowel, not significantly changed compared to prior and suggestive of ileus versus partial obstruction Electronically Signed   By: Donavan Foil M.D.   On: 10/29/2022 16:20   CT ABDOMEN PELVIS W CONTRAST  Result Date: 10/28/2022 CLINICAL DATA:  Abdominal pain EXAM: CT ABDOMEN AND PELVIS WITH CONTRAST TECHNIQUE: Multidetector CT imaging of the abdomen and pelvis was performed using the standard protocol following bolus administration of intravenous contrast. RADIATION DOSE REDUCTION: This exam was performed according to the departmental dose-optimization program which includes automated exposure control, adjustment of the mA and/or kV according to patient size and/or use of iterative reconstruction technique. CONTRAST:  124m OMNIPAQUE IOHEXOL 300 MG/ML  SOLN COMPARISON:  CT 10/19/2022 FINDINGS: Lower chest: New small left pleural effusion. Hepatobiliary: Geographic hepatic steatosis. No definite liver lesion. Unremarkable gallbladder. No hyperdense gallstone. No biliary dilatation. Pancreas: Multiloculated cystic lesion of the pancreatic tail measuring approximately 7.9 x 4.5 x 4.0 cm.  No ductal dilatation is seen. Spleen: Normal in size without focal abnormality. Adrenals/Urinary Tract: Unremarkable adrenal glands. Nonobstructing stones within the left kidney. Kidneys enhance symmetrically. No renal lesion. No hydronephrosis. Urinary bladder unremarkable. Stomach/Bowel: Abnormal wall thickening of the gastric antrum (series 4, image 37). Multiple dilated loops of small bowel have developed, measuring up to 4.1 cm in diameter. No definite transition point. Numerous thickened, hyperenhancing loops of distal small bowel. Moderate volume of stool within the colon. Vascular/Lymphatic: Minimal aortic atherosclerosis. No aneurysm. No lymphadenopathy is seen. Reproductive: Cystic-appearing lesion of the anterior uterine body measuring 3.4 cm (series 2, image 72) large bilateral multi-cystic adnexal masses measuring approximately 11.2 x 6.9 x 10.0 cm on the right and 10.9 x 8.2 x 10.8 cm on the left. Other: Small-moderate volume ascites, decreased in volume since prior. No organized or rim enhancing collections. No pneumoperitoneum. Similar appearance of peritoneal and omental nodularity compatible with carcinomatosis. Musculoskeletal: No acute or significant osseous findings. Mild anasarca. IMPRESSION: 1. Multiple dilated loops of small bowel have developed which may represent ileus versus partial or developing small bowel obstruction. No definite transition point is identified. 2. Numerous thickened, hyperenhancing loops of distal small bowel, suggestive of an infectious or inflammatory enteritis. 3. Abnormal wall thickening of the gastric antrum, which could be secondary to gastritis or malignancy. Follow-up findings from recent endoscopy. 4. Large bilateral multi-cystic adnexal masses measuring up to 11.2 cm on the right and 10.8 cm on the left, concerning for primary or metastatic ovarian malignancy. 5. Multiloculated cystic lesion of the pancreatic tail measuring up to 7.9 cm. 6. Small-moderate  volume ascites, decreased in volume since prior. Similar appearance of peritoneal and omental nodularity compatible with carcinomatosis. 7. New small left pleural effusion. 8. Geographic hepatic steatosis. 9. Nonobstructing left renal stones. Electronically Signed   By: NDavina PokeD.O.   On: 10/28/2022 16:36   [place] SITE RITE  Result Date: 10/28/2022 If Site Rite image not attached, placement could not be confirmed due to current cardiac rhythm.  [place]  Result Date: 10/27/2022 INDICATION: Abdominal distention. Recurrent ascites secondary to peritoneal carcinomatosis. Request for diagnostic and therapeutic paracentesis EXAM: ULTRASOUND GUIDED RIGHT LOWER QUADRANT PARACENTESIS MEDICATIONS: 1% plain lidocaine, 5 mL COMPLICATIONS: None immediate. PROCEDURE: Informed written consent was obtained from the patient after a discussion of the risks, benefits and alternatives to treatment. A timeout was performed prior to the initiation of the procedure. Initial ultrasound scanning demonstrates a small to moderate amount  of ascites within the right lower abdominal quadrant. The right lower abdomen was prepped and draped in the usual sterile fashion. 1% lidocaine was used for local anesthesia. Following this, a 19 gauge, 7-cm, Yueh catheter was introduced. An ultrasound image was saved for documentation purposes. The paracentesis was performed. The catheter was removed and a dressing was applied. The patient tolerated the procedure well without immediate post procedural complication. FINDINGS: A total of approximately 1.9 L of clear yellow fluid was removed. Samples were sent to the laboratory as requested by the clinical team. IMPRESSION: Successful ultrasound-guided paracentesis yielding 1.9 liters of peritoneal fluid. Read by: Ascencion Dike PA-C Electronically Signed   By: Jerilynn Mages.  Shick M.D.   On: 10/27/2022 16:26   IR IMAGING GUIDED PORT INSERTION  Result Date: 10/26/2022 CLINICAL DATA:  Peritoneal  carcinomatosis and need for porta cath to begin chemotherapy. The patient is currently admitted to the hospital and will begin chemotherapy as an inpatient. EXAM: IMPLANTED PORT A CATH PLACEMENT WITH ULTRASOUND AND FLUOROSCOPIC GUIDANCE ANESTHESIA/SEDATION: Moderate (conscious) sedation was employed during this procedure. A total of Versed 2.0 mg and Fentanyl 100 mcg was administered intravenously by radiology nursing. Moderate Sedation Time: 34 minutes. The patient's level of consciousness and vital signs were monitored continuously by radiology nursing throughout the procedure under my direct supervision. FLUOROSCOPY: 2.0 mGy PROCEDURE: The procedure, risks, benefits, and alternatives were explained to the patient. Questions regarding the procedure were encouraged and answered. The patient understands and consents to the procedure. A time-out was performed prior to initiating the procedure. Ultrasound was utilized to confirm patency of the right internal jugular vein. A permanent ultrasound image was saved and recorded. The right neck and chest were prepped with chlorhexidine in a sterile fashion, and a sterile drape was applied covering the operative field. Maximum barrier sterile technique with sterile gowns and gloves were used for the procedure. Local anesthesia was provided with 1% lidocaine. After creating a small venotomy incision, a 21 gauge needle was advanced into the right internal jugular vein under direct, real-time ultrasound guidance. Ultrasound image documentation was performed. After securing guidewire access, an 8 Fr dilator was placed. A J-wire was kinked to measure appropriate catheter length. A subcutaneous port pocket was then created along the upper chest wall utilizing sharp and blunt dissection. Portable cautery was utilized. The pocket was irrigated with sterile saline. A single lumen power injectable port was chosen for placement. The 8 Fr catheter was tunneled from the port pocket site  to the venotomy incision. The port was placed in the pocket. External catheter was trimmed to appropriate length based on guidewire measurement. At the venotomy, an 8 Fr peel-away sheath was placed over a guidewire. The catheter was then placed through the sheath and the sheath removed. Final catheter positioning was confirmed and documented with a fluoroscopic spot image. The port was accessed with a needle and aspirated and flushed with heparinized saline. The access needle was left in place. The venotomy and port pocket incisions were closed with subcutaneous 3-0 Monocryl and subcuticular 4-0 Vicryl. Dermabond was applied to both incisions. COMPLICATIONS: COMPLICATIONS None FINDINGS: After catheter placement, the tip lies at the cavo-atrial junction. The catheter aspirates normally and is ready for immediate use. IMPRESSION: Placement of single lumen port a cath via right internal jugular vein. The catheter tip lies at the cavo-atrial junction. A power injectable port a cath was placed and is ready for immediate use. Electronically Signed   By: Aletta Edouard M.D.   On:  10/26/2022 17:05   DG Abd 1 View  Result Date: 10/26/2022 CLINICAL DATA:  Abdominal distension, newly diagnosed metastatic adenocarcinoma to the peritoneum, paracentesis 3 days ago EXAM: ABDOMEN - 1 VIEW COMPARISON:  Portable exam 0957 hours without priors for comparison FINDINGS: Air-filled proximal half of colon and a few small bowel loops in LEFT mid abdomen. Small amount gas in rectum. No bowel wall thickening or definite obstruction identified. Diffusely increased attenuation in peritoneal cavity consistent with ascites. No osseous abnormalities. IMPRESSION: Ascites. Nonobstructive bowel gas pattern. Electronically Signed   By: Lavonia Dana M.D.   On: 10/26/2022 10:53   CT CHEST W CONTRAST  Result Date: 10/25/2022 CLINICAL DATA:  Occult malignancy. EXAM: CT CHEST WITH CONTRAST TECHNIQUE: Multidetector CT imaging of the chest was  performed during intravenous contrast administration. RADIATION DOSE REDUCTION: This exam was performed according to the departmental dose-optimization program which includes automated exposure control, adjustment of the mA and/or kV according to patient size and/or use of iterative reconstruction technique. CONTRAST:  28m OMNIPAQUE IOHEXOL 300 MG/ML  SOLN COMPARISON:  CT abdomen 10/19/2022 FINDINGS: Cardiovascular: Heart is normal size. Thoracic aorta is normal in caliber. Pulmonary arterial system is adequately opacified and demonstrates small embolus of the proximal right lower lobar pulmonary artery. No evidence of right heart strain. Remaining vascular structures are normal. Mediastinum/Nodes: No mediastinal or hilar adenopathy. Remaining mediastinal structures are normal. Lungs/Pleura: Lungs are adequately inflated. No airspace consolidation or effusion. Airways are normal. Upper Abdomen: Mild to moderate ascites over the upper abdomen unchanged. Remainder of the upper abdomen is unchanged from the recent prior exam. Musculoskeletal: No focal abnormality. IMPRESSION: 1. Small embolus of the proximal right lower lobar pulmonary artery. No evidence of right heart strain. 2. Mild to moderate ascites over the upper abdomen unchanged from the recent prior exam. Critical Value/emergent results were called by telephone at the time of interpretation on 10/25/2022 at 7:10 p.m. to provider Nurse practitioner, ERufina Falco who verbally acknowledged these results. Electronically Signed   By: DMarin OlpM.D.   On: 10/25/2022 19:11   [place]orea[place]acentesis  Result Date: 10/23/2022 INDICATION: Patient with history of abdominal pain, peritoneal carcinomatosis, cystic and solid masses of bilateral ovaries, cystic lesion pancreatic tail, recurrent ascites. Request received for therapeutic paracentesis. EXAM: ULTRASOUND GUIDED THERAPEUTIC PARACENTESIS MEDICATIONS: 8 mL 1% lidocaine COMPLICATIONS: None immediate. PROCEDURE:  Informed written consent was obtained from the patient after a discussion of the risks, benefits and alternatives to treatment. A timeout was performed prior to the initiation of the procedure. Initial ultrasound scanning demonstrates a moderate amount of ascites within the left lower abdominal quadrant. The left lower abdomen was prepped and draped in the usual sterile fashion. 1% lidocaine was used for local anesthesia. Following this, a 19 gauge, 7-cm, Yueh catheter was introduced. An ultrasound image was saved for documentation purposes. The paracentesis was performed. The catheter was removed and a dressing was applied. The patient tolerated the procedure well without immediate post procedural complication. FINDINGS: A total of approximately 3.1 liters of yellow fluid was removed. IMPRESSION: Successful ultrasound-guided therapeutic paracentesis yielding 3.1 liters of peritoneal fluid. Read by: KRowe Robert PA-C Electronically Signed   By: AMarkus DaftM.D.   On: 10/23/2022 16:47   [place]oreaParacentesis  Result Date: 10/20/2022 INDICATION: Peritoneal carcinomatosis with ascites EXAM: ULTRASOUND GUIDED DIAGNOSTIC AND THERAPEUTIC PARACENTESIS MEDICATIONS: None. COMPLICATIONS: None immediate. PROCEDURE: Informed written consent was obtained from the patient after a discussion of the risks, benefits and alternatives to treatment. A timeout was  performed prior to the initiation of the procedure. Initial ultrasound scanning demonstrates a moderate amount of ascites within the left upper abdominal quadrant. The left upper abdomen was prepped and draped in the usual sterile fashion. 1% lidocaine was used for local anesthesia. Following this, a 19 gauge, 7-cm, Yueh catheter was introduced under direct US guidance. An ultrasound image was saved for documentation purposes. The paracentesis was performed. The catheter was removed and a dressing was applied. The patient tolerated the procedure well without immediate post  procedural complication. FINDINGS: A total of approximately 3.5L of ascitic fluid was removed. Samples were sent to the laboratory as requested by the clinical team. IMPRESSION: Successful ultrasound-guided diagnostic and therapeutic paracentesis yielding 3.5 L of peritoneal fluid. Read by Pasty Spillers, IR PA Electronically Signed   By: Michaelle Birks M.D.   On: 10/20/2022 11:17   CT ABDOMEN PELVIS W CONTRAST  Result Date: 10/19/2022 CLINICAL DATA:  Abdominal pain; * Tracking Code: BO * EXAM: CT ABDOMEN AND PELVIS WITH CONTRAST TECHNIQUE: Multidetector CT imaging of the abdomen and pelvis was performed using the standard protocol following bolus administration of intravenous contrast. RADIATION DOSE REDUCTION: This exam was performed according to the departmental dose-optimization program which includes automated exposure control, adjustment of the mA and/or kV according to patient size and/or use of iterative reconstruction technique. CONTRAST:  100m OMNIPAQUE IOHEXOL 300 MG/ML  SOLN COMPARISON:  CT abdomen and pelvis dated October 21, 2009 FINDINGS: Lower chest: No acute abnormality. Hepatobiliary: No focal liver abnormality is seen. No gallstones, gallbladder wall thickening, or biliary dilatation. Pancreas: Multiloculated cystic lesion of the pancreatic tail measuring 7.5 x 4.1 cm on series 2, image 24. No main pancreatic duct dilation. Spleen: Normal in size without focal abnormality. Adrenals/Urinary Tract: Bilateral adrenal glands are unremarkable. No hydronephrosis. Nonobstructing stones of the lower pole of the left kidney. No suspicious renal lesions. Bladder is unremarkable. Stomach/Bowel: Stomach is within normal limits. No evidence of bowel wall thickening, distention, or inflammatory changes. Vascular/Lymphatic: Mild aortic atherosclerosis. No enlarged abdominal or pelvic lymph nodes. Reproductive: Left-greater-than-right multiloculated cystic and solid masses of the bilateral ovaries measuring 8.2  x 8.3 x 10.1 cm on the left and 9.1 x 6.8 by 9.0 cm on the right. Other: Large volume abdominal ascites, peritoneal implants and omental caking. Reference peritoneal implant of the left paracolic gutter measuring 2.6 x 1.7 cm on series 2, image 29. Reference serosal implant located posterior to the wall of the stomach measuring 1.5 x 1.7 cm on image 30. Musculoskeletal: Tiny sclerotic lesions of the left sacrum are unchanged when compared with prior exam and likely due to bone islands. No aggressive appearing osseous lesions IMPRESSION: 1. Large volume abdominal ascites, peritoneal implants and omental caking, compatible with peritoneal carcinomatosis. 2. Left-greater-than-right multiloculated cystic and solid masses of the bilateral ovaries, differential considerations include primary ovarian malignancy or metastatic tumor to the ovaries. 3. Multiloculated cystic lesion of the pancreatic tail, concerning for primary pancreatic neoplasm versus metastatic disease. 4. Nonobstructing left renal stone. 5.  Aortic Atherosclerosis (ICD10-I70.0). Electronically Signed   By: LYetta GlassmanM.D.   On: 10/19/2022 15:49   (Echo, Carotid, EGD, Colonoscopy, ERCP)    Subjective: Patient seen and examined.  Her sister was at the bedside.  She had few loose bowel movements overnight otherwise denies any nausea vomiting or abdominal distention.  Eager to go home today.  Able to eat 100% of the meal.   Discharge Exam: Vitals:   11/17/22 2215 11/18/22 0450  BP:  121/79 124/84  Pulse: 89 91  Resp: 14 16  Temp: 98.3 F (36.8 C) 98 F (36.7 C)  SpO2: 93% 94%   Vitals:   11/17/22 0547 11/17/22 1306 11/17/22 2215 11/18/22 0450  BP: (!) 132/90 (!) 130/91 121/79 124/84  Pulse: (!) 110 (!) 104 89 91  Resp: '18 18 14 16  '$ Temp: 98.1 F (36.7 C) 98.2 F (36.8 C) 98.3 F (36.8 C) 98 F (36.7 C)  TempSrc: Oral Oral Oral Oral  SpO2: 95% 97% 93% 94%  Weight: 68.8 kg   64.3 kg  Height:        General: Pt is alert,  awake, not in acute distress Cardiovascular: RRR, S1/S2 +, no rubs, no gallops, right chest wall Port-A-Cath in place. Respiratory: CTA bilaterally, no wheezing, no rhonchi Abdominal: Soft, mildly distended but nontender. Extremities: no edema, no cyanosis    The results of significant diagnostics from this hospitalization (including imaging, microbiology, ancillary and laboratory) are listed below for reference.     Microbiology: No results found for this or any previous visit (from the past 240 hour(s)).   Labs: BNP (last 3 results) No results for input(s): "BNP" in the last 8760 hours. Basic Metabolic Panel: Recent Labs  Lab 11/12/22 0500 11/13/22 0500 11/15/22 0640 11/17/22 0615  NA 133* 133* 130* 130*  K 4.1 3.9 4.1 3.9  CL 101 101 97* 93*  CO2 '25 27 26 27  '$ GLUCOSE 184* 156* 225* 179*  BUN '19 16 16 '$ 24*  CREATININE 0.44 0.46 0.44 0.58  CALCIUM 7.8* 7.6* 8.0* 7.8*  MG 1.7 1.9 2.1 2.1  PHOS 2.9 3.5 3.9 4.9*   Liver Function Tests: Recent Labs  Lab 11/12/22 0500 11/15/22 0640 11/17/22 0615  AST '28 18 16  '$ ALT 52* 44 40  ALKPHOS 87 98 91  BILITOT 0.4 0.4 0.2*  PROT 4.5* 5.2* 5.1*  ALBUMIN <1.5* 1.8* 1.7*   No results for input(s): "LIPASE", "AMYLASE" in the last 168 hours. No results for input(s): "AMMONIA" in the last 168 hours. CBC: Recent Labs  Lab 11/12/22 0500 11/14/22 0816 11/17/22 0615  WBC 17.3* 18.2* 7.7  NEUTROABS  --  13.1* 6.8  HGB 7.9* 8.3* 9.4*  HCT 25.7* 26.9* 30.7*  MCV 76.7* 78.2* 75.4*  PLT 340 346 330   Cardiac Enzymes: No results for input(s): "CKTOTAL", "CKMB", "CKMBINDEX", "TROPONINI" in the last 168 hours. BNP: Invalid input(s): "POCBNP" CBG: Recent Labs  Lab 11/16/22 0746 11/16/22 1201 11/16/22 1650 11/16/22 2056 11/17/22 0745  GLUCAP 160* 149* 155* 132* 181*   D-Dimer No results for input(s): "DDIMER" in the last 72 hours. Hgb A1c No results for input(s): "HGBA1C" in the last 72 hours. Lipid Profile No results  for input(s): "CHOL", "HDL", "LDLCALC", "TRIG", "CHOLHDL", "LDLDIRECT" in the last 72 hours. Thyroid function studies No results for input(s): "TSH", "T4TOTAL", "T3FREE", "THYROIDAB" in the last 72 hours.  Invalid input(s): "FREET3" Anemia work up No results for input(s): "VITAMINB12", "FOLATE", "FERRITIN", "TIBC", "IRON", "RETICCTPCT" in the last 72 hours. Urinalysis    Component Value Date/Time   COLORURINE YELLOW 10/19/2022 1325   APPEARANCEUR CLEAR 10/19/2022 1325   LABSPEC 1.024 10/19/2022 1325   PHURINE 6.0 10/19/2022 1325   GLUCOSEU NEGATIVE 10/19/2022 1325   HGBUR SMALL (A) 10/19/2022 1325   BILIRUBINUR NEGATIVE 10/19/2022 1325   KETONESUR NEGATIVE 10/19/2022 1325   PROTEINUR TRACE (A) 10/19/2022 1325   UROBILINOGEN 2.0 (H) 05/23/2015 0022   NITRITE NEGATIVE 10/19/2022 1325   LEUKOCYTESUR SMALL (A) 10/19/2022 1325  Sepsis Labs Recent Labs  Lab 11/12/22 0500 11/14/22 0816 11/17/22 0615  WBC 17.3* 18.2* 7.7   Microbiology No results found for this or any previous visit (from the past 240 hour(s)).   Time coordinating discharge:  35 minutes  SIGNED:   Barb Merino, MD  [facility] 11/18/2022, 8:34 AM

## 2022-11-19 ENCOUNTER — Encounter (HOSPITAL_COMMUNITY): Payer: Self-pay | Admitting: Hematology

## 2022-11-19 LAB — FUNGUS CULTURE WITH STAIN

## 2022-11-19 LAB — FUNGAL ORGANISM REFLEX

## 2022-11-19 LAB — FUNGUS CULTURE RESULT

## 2022-11-22 ENCOUNTER — Other Ambulatory Visit: Payer: Self-pay | Admitting: Pharmacist

## 2022-11-22 ENCOUNTER — Telehealth: Payer: Self-pay

## 2022-11-22 DIAGNOSIS — C786 Secondary malignant neoplasm of retroperitoneum and peritoneum: Secondary | ICD-10-CM

## 2022-11-22 NOTE — Telephone Encounter (Addendum)
Called Dr. Lavera Guise office in [place] and spoke with [facility] in scheduling she stated she would call the patient to set up her appointment with Dr. Bobby Rumpf. She has full access to the reports and office notes.    ----- Message from Truitt Merle, MD sent at 11/20/2022  8:53 AM EST ----- I referred her to [facility] office Dr. Bobby Rumpf last week, she was discharged from hospital over the past weekend. Please call [place] office and make sure she has appointment this week, and fax the Cancer Type ID report and FO report (first few pages) to them, if they can not see in [place],  thanks   [employer]

## 2022-11-26 ENCOUNTER — Encounter (HOSPITAL_COMMUNITY): Payer: Self-pay

## 2022-11-26 ENCOUNTER — Other Ambulatory Visit: Payer: Medicaid Other

## 2022-11-26 ENCOUNTER — Ambulatory Visit: Payer: Medicaid Other | Admitting: Oncology

## 2022-11-26 NOTE — Progress Notes (Signed)
[facility]  [address] [place],  [place]  [address] [phone]  Clinic Day:  11/27/2022  Referring physician: [name], MD  HISTORY OF PRESENT ILLNESS:  The patient is a 47 y.o. female  who I was asked to consult upon for the continued management of what clinically appears to be metastatic pancreatic cancer, with spread of disease to her peritoneum, uterus and ovaries.  Due to her malignant ascites, she also has undergone 3 paracenteses.  She comes in today to be evaluated before heading into her 3rd cycle of FOLFIRINOX.  Of note, her 1st 2 cycles were given during her prolonged hospitalization in [place].  The patient comes into clinic today feeling fairly weak.  She recalls having one voluminous bloody bowel movement approximately 1 week ago.  She does get intermittent nausea, which is managed with her oral antiemetics.  Of note, she is on Eliquis due to a small right-sided pulmonary embolus seen on February 2024 CT scans.    Her history dates back to January 2024 when she first began having lower quadrant abdominal pain.  Over a span of weeks, she began having abdominal swelling and distention.  She also began having increased constipation.  This constellation of symptoms led to her undergoing CT scans of her abdomen/pelvis, which reveal a 7.9 cm cystic pancreatic lesion, with an irregular stomach border.  There was extensive ascites, peritoneal carcinomatosis, bilateral adnexal masses and uterine involvement.  The patient underwent a paracentesis in early February 2024, which revealed malignant cells, consistent with adenocarcinoma.  She also underwent an endometrial biopsy, which revealed metastatic adenocarcinoma.  In particular, immunohistochemical stains were positive for CK7/CDX2/CK20.  PAX8 and ER staining was negative. This pattern was consistent with a malignancy from an upper GI/pancreaticobiliary origin.  Biotheranostics testing showed the  origin being pancreaticobiliary in nature. The patient claims to have lost over 40 pounds over the past 4 months.  She spends half of her day on the couch.  PAST MEDICAL HISTORY:   Past Medical History:  Diagnosis Date   Anemia    Anxiety    Arthritis    Cancer ([place])    Depression    Gestational diabetes mellitus, antepartum 2015   managed with diet   History of HPV infection    History of renal calculi    Hypertension    on meds until wt loss   Pancreatic cancer (HCC)    PONV (postoperative nausea and vomiting)    Pulmonary embolism during current hospitalization ([place])    Seizures ([place]) 2001   stressed induced; 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    PAST SURGICAL HISTORY:   Past Surgical History:  Procedure Laterality Date   APPENDECTOMY     BIOPSY  10/26/2022   Procedure: BIOPSY;  Surgeon: Wilford Corner, MD;  Location: [facility];  Service: Gastroenterology;;   CESAREAN SECTION     CESAREAN SECTION N/A 07/14/2014   Procedure: CESAREAN SECTION;  Surgeon: Cheri Fowler, MD;  Location: [facility];  Service: Obstetrics;  Laterality: N/A;   CESAREAN SECTION N/A 05/23/2015   Procedure: CESAREAN SECTION;  Surgeon: Cheri Fowler, MD;  Location: [facility];  Service: Obstetrics;  Laterality: N/A;   ESOPHAGOGASTRODUODENOSCOPY N/A 10/26/2022   Procedure: ESOPHAGOGASTRODUODENOSCOPY (EGD);  Surgeon: Wilford Corner, MD;  Location: [facility];  Service: Gastroenterology;  Laterality: N/A;   IR IMAGING GUIDED PORT INSERTION  10/26/2022   LAPAROSCOPIC BILATERAL SALPINGECTOMY Bilateral 06/12/2016   Procedure: LAPAROSCOPIC BILATERAL SALPINGECTOMY;  Surgeon: Jenny Reichmann  France Ravens, MD;  Location: [facility];  Service: Gynecology;  Laterality: Bilateral;   WISDOM TOOTH EXTRACTION      CURRENT MEDICATIONS:   Current Outpatient Medications  Medication Sig Dispense Refill   acetaminophen (TYLENOL) 500 MG tablet Take 500-1,000 mg by mouth 3 (three) times daily as needed for mild pain or  moderate pain.     amLODipine (NORVASC) 10 MG tablet Take 1 tablet (10 mg total) by mouth daily. 30 tablet 0   apixaban (ELIQUIS) 5 MG TABS tablet Take 1 tablet (5 mg total) by mouth 2 (two) times daily. 60 tablet 2   clonazePAM (KLONOPIN) 0.5 MG tablet Take 1 tablet (0.5 mg total) by mouth 2 (two) times daily for 14 days. 28 tablet 0   dexamethasone (DECADRON) 4 MG tablet Take 1 tablet (4 mg total) by mouth daily for 14 days. 14 tablet 0   fentaNYL (DURAGESIC) 75 MCG/HR Place 1 patch onto the skin every 3 (three) days. 5 patch 0   ferrous sulfate 325 (65 FE) MG tablet Take 1 tablet (325 mg total) by mouth daily with breakfast. 30 tablet 0   folic acid (FOLVITE) 1 MG tablet Take 1 tablet (1 mg total) by mouth daily. 30 tablet 0   loratadine (CLARITIN) 10 MG tablet Take 1 tablet (10 mg total) by mouth daily. 30 tablet 0   losartan (COZAAR) 50 MG tablet Take 1 tablet (50 mg total) by mouth daily. 30 tablet 0   Multiple Vitamin (MULTIVITAMIN WITH MINERALS) TABS tablet Take 1 tablet by mouth daily. 30 tablet 0   naloxone (NARCAN) 0.4 MG/ML injection Inject 1 mL (0.4 mg total) into the vein as needed. 1 mL 2   OLANZapine zydis (ZYPREXA) 5 MG disintegrating tablet Take 1 tablet (5 mg total) by mouth at bedtime. 30 tablet 0   ondansetron (ZOFRAN-ODT) 4 MG disintegrating tablet Take 1 tablet (4 mg total) by mouth every 8 (eight) hours as needed for nausea or vomiting. 20 tablet 0   polyethylene glycol (MIRALAX / GLYCOLAX) 17 g packet Take 17 g by mouth daily as needed for moderate constipation or severe constipation.     polyethylene glycol (MIRALAX / GLYCOLAX) 17 g packet Take 17 g by mouth daily. 14 each 0   pregabalin (LYRICA) 50 MG capsule Take 1 capsule (50 mg total) by mouth at bedtime. 30 capsule 0   prochlorperazine (COMPAZINE) 10 MG tablet Take 1 tablet (10 mg total) by mouth every 6 (six) hours as needed for refractory nausea / vomiting. 30 tablet 0   senna-docusate (SENOKOT-S) 8.6-50 MG tablet  Take 2 tablets by mouth at bedtime. 60 tablet 0   Simethicone (GAS-X PO) Take 2 tablets by mouth as needed (bloat).     Sodium Phosphates (FLEET ENEMA RE) Place 1 Dose rectally as needed (consttipation).     spironolactone (ALDACTONE) 25 MG tablet Take 1 tablet (25 mg total) by mouth daily. 30 tablet 0   No current facility-administered medications for this visit.    ALLERGIES:   Allergies  Allergen Reactions   Diphenhydramine-Apap (Sleep) Other (See Comments)   Lorazepam Other (See Comments)    After a few doses it made her feel paranoid    FAMILY HISTORY:   Family History  Problem Relation Age of Onset   Hypertension Mother    Diabetes Mother    Cancer Father    Crohn's disease Sister    Deep vein thrombosis Brother    Alcohol abuse Brother    CVA Brother  Autism Son     SOCIAL HISTORY:  The patient was born and raised in [place], [place].  She lives in [place] with her ex-husband.  She has 3 children.  She previously worked in a [employer].  She also worked at a [employer].  She smoked 2 packs of cigarettes weekly x 20 years up until last month.  She drank alcohol socially  REVIEW OF SYSTEMS:  Review of Systems  Constitutional:  Positive for fatigue. Negative for fever.  HENT:   Negative for hearing loss and sore throat.   Eyes:  Positive for eye problems.  Respiratory:  Positive for shortness of breath. Negative for chest tightness, cough and hemoptysis.   Cardiovascular:  Positive for palpitations. Negative for chest pain.  Gastrointestinal:  Positive for blood in stool, diarrhea and nausea. Negative for abdominal distention, abdominal pain, constipation and vomiting.  Endocrine: Negative for hot flashes.  Genitourinary:  Negative for difficulty urinating, dysuria, frequency, hematuria and nocturia.   Musculoskeletal:  Negative for arthralgias, back pain, gait problem and myalgias.  Skin: Negative.  Negative for itching and rash.   Neurological:  Positive for dizziness. Negative for extremity weakness, gait problem, headaches, light-headedness and numbness.  Hematological: Negative.   Psychiatric/Behavioral:  Positive for depression. Negative for suicidal ideas. The patient is nervous/anxious.      PHYSICAL EXAM:  Blood pressure (!) 157/93, pulse (!) 119, temperature 98.8 F (37.1 C), resp. rate 18, height '4\' 11"'$  (1.499 m), weight 138 lb 6.4 oz (62.8 kg), SpO2 94 %, unknown if currently breastfeeding. Wt Readings from Last 3 Encounters:  11/27/22 138 lb 6.4 oz (62.8 kg)  11/18/22 141 lb 12.1 oz (64.3 kg)  07/21/17 198 lb (89.8 kg)   Body mass index is 27.95 kg/m. Performance status (ECOG): 2 - Symptomatic, <50% confined to bed Physical Exam Constitutional:      Appearance: Normal appearance. She is ill-appearing.     Comments: She appears weak and somewhat pale  HENT:     Mouth/Throat:     Mouth: Mucous membranes are moist.     Pharynx: Oropharynx is clear. No oropharyngeal exudate or posterior oropharyngeal erythema.  Cardiovascular:     Rate and Rhythm: Normal rate and regular rhythm.     Heart sounds: No murmur heard.    No friction rub. No gallop.  Pulmonary:     Effort: Pulmonary effort is normal. No respiratory distress.     Breath sounds: Normal breath sounds. No wheezing, rhonchi or rales.  Abdominal:     General: Bowel sounds are normal. There is distension.     Palpations: Abdomen is soft. There is no mass.     Tenderness: There is no abdominal tenderness.  Musculoskeletal:        General: No swelling.     Right lower leg: No edema.     Left lower leg: No edema.  Lymphadenopathy:     Cervical: No cervical adenopathy.     Upper Body:     Right upper body: No supraclavicular or axillary adenopathy.     Left upper body: No supraclavicular or axillary adenopathy.     Lower Body: No right inguinal adenopathy. No left inguinal adenopathy.  Skin:    General: Skin is warm.     Coloration: Skin  is not jaundiced.     Findings: No lesion or rash.  Neurological:     General: No focal deficit present.     Mental Status: She is alert and oriented to  person, place, and time. Mental status is at baseline.  Psychiatric:        Mood and Affect: Mood normal.        Behavior: Behavior normal.        Thought Content: Thought content normal.    LABS:      Latest Ref Rng & Units 11/17/2022    6:15 AM 11/14/2022    8:16 AM 11/12/2022    5:00 AM  CBC  WBC 4.0 - 10.5 K/uL 7.7  18.2  17.3   Hemoglobin 12.0 - 15.0 g/dL 9.4  8.3  7.9   Hematocrit 36.0 - 46.0 % 30.7  26.9  25.7   Platelets 150 - 400 K/uL 330  346  340       Latest Ref Rng & Units 11/17/2022    6:15 AM 11/15/2022    6:40 AM 11/13/2022    5:00 AM  CMP  Glucose 70 - 99 mg/dL 179  225  156   BUN 6 - 20 mg/dL '24  16  16   '$ Creatinine 0.44 - 1.00 mg/dL 0.58  0.44  0.46   Sodium 135 - 145 mmol/L 130  130  133   Potassium 3.5 - 5.1 mmol/L 3.9  4.1  3.9   Chloride 98 - 111 mmol/L 93  97  101   CO2 22 - 32 mmol/L '27  26  27   '$ Calcium 8.9 - 10.3 mg/dL 7.8  8.0  7.6   Total Protein 6.5 - 8.1 g/dL 5.1  5.2    Total Bilirubin 0.3 - 1.2 mg/dL 0.2  0.4    Alkaline Phos 38 - 126 U/L 91  98    AST 15 - 41 U/L 16  18    ALT 0 - 44 U/L 40  44      ASSESSMENT & PLAN:  A 47 y.o. female who I was asked to consult upon for the continued management of her likely metastatic pancreatic cancer.  As she is weak today, I will give her an extra week to recuperate before she heads into her 3rd cycle of FOLFIRINOX, which will now be given on March 20th.  When evaluating her labs today, her hemoglobin and MCV are lower than what they have been previously.  Although her iron profile is not entirely consistent with iron deficiency anemia, I will give her a course of IV iron with her next cycle of FOLFIRINOX with the hopes it will lead to a higher hemoglobin level over time.  The patient understands she is ultimately dealing with incurable disease.  However, I  will continue to give her palliative chemotherapy as long as she is willing to continue therapy to keep her metastatic disease burden under some semblance of control.  I will see her back in early April 2024 before she heads into her 4th cycle of FOLFIRINOX.  The patient understands all the plans discussed today and is in agreement with them.    I do appreciate [name], MD for his new consult.   Mung Rinker Macarthur Critchley, MD

## 2022-11-27 ENCOUNTER — Telehealth: Payer: Self-pay | Admitting: *Deleted

## 2022-11-27 ENCOUNTER — Inpatient Hospital Stay: Payer: Medicaid Other | Attending: Oncology | Admitting: Oncology

## 2022-11-27 ENCOUNTER — Encounter: Payer: Self-pay | Admitting: Oncology

## 2022-11-27 VITALS — BP 157/93 | HR 119 | Temp 98.8°F | Resp 18 | Ht 59.0 in | Wt 138.4 lb

## 2022-11-27 DIAGNOSIS — C259 Malignant neoplasm of pancreas, unspecified: Secondary | ICD-10-CM | POA: Diagnosis not present

## 2022-11-27 DIAGNOSIS — Z87891 Personal history of nicotine dependence: Secondary | ICD-10-CM | POA: Diagnosis not present

## 2022-11-27 DIAGNOSIS — C7963 Secondary malignant neoplasm of bilateral ovaries: Secondary | ICD-10-CM | POA: Insufficient documentation

## 2022-11-27 DIAGNOSIS — I10 Essential (primary) hypertension: Secondary | ICD-10-CM | POA: Diagnosis not present

## 2022-11-27 DIAGNOSIS — Z5111 Encounter for antineoplastic chemotherapy: Secondary | ICD-10-CM | POA: Insufficient documentation

## 2022-11-27 DIAGNOSIS — D500 Iron deficiency anemia secondary to blood loss (chronic): Secondary | ICD-10-CM | POA: Insufficient documentation

## 2022-11-27 DIAGNOSIS — C786 Secondary malignant neoplasm of retroperitoneum and peritoneum: Secondary | ICD-10-CM | POA: Diagnosis not present

## 2022-11-27 DIAGNOSIS — R710 Precipitous drop in hematocrit: Secondary | ICD-10-CM | POA: Insufficient documentation

## 2022-11-27 NOTE — Telephone Encounter (Signed)
This RN received call from pt's sister,Kimberly East, stating concern per visit with MD in [place]. She stated MD gave different plan of care then was previously discussed by Dr Burr Medico and Dr Berline Lopes.  " He said that he would only do chemotherapy until it didn't work anymore but he did not recommend surgery" " Just felt very uncompassionate"  Joelene Millin stated Airlie would like to return to see Dr Burr Medico.  This RN informed her the concerns above will be forwarded to Dr Burr Medico. Joelene Millin stated appreciation of discussion.  Return call to [place] verified as [phone].  Of note this RN did not state to the pt that she obtained the nurse for Dr Chryl Heck- as when call answered Joelene Millin began talking without pause and this RN did not want to interrupt her.  This message will be forwarded to Dr Burr Medico and her nursing team

## 2022-11-28 ENCOUNTER — Ambulatory Visit: Payer: Medicaid Other

## 2022-11-28 ENCOUNTER — Ambulatory Visit: Payer: Medicaid Other | Admitting: Dietician

## 2022-11-28 NOTE — Progress Notes (Signed)
Nutrition Follow-up:  Called patient at home telephone number to follow up on recent malnutrition diagnosis by IP RD.  She is being treated for metastatic pancreatic cancer, with spread of disease to her peritoneum, uterus and ovaries.  She has recently transferred care to Dr. Bobby Rumpf.  She has received 2 treatments of FOLFIRINOX, and her 3rd cycle that was scheduled today was held due to her weakness.   She reports the week after her treatments she can't think about eating. She is using her anti nausea medications.  Her bowels now are just firming up and regula.  They were loose.  She isn't currently having pain associated with eating but she is starting to have some dysgeusia and intolerance to foods tasting too salty.  PO recently includes: .   Baked potato, sour cream and butter, cottage cheese, yogurts (regular)  Spaghetti and meatballs, chicken  noodle soup, scrambled eggs and cheese,  but can't management [place] [employer] Breakfast Bowls or bacon (too salty) Fluids:Gatorades,  Ensure Plus,  V-8 splash juices  Medications: Hasn't been taking any vitamins or supplements  Labs: 11/17/22  Glucose 179, Na 130, Hgb 9.4, Albumin 1.7  Anthropometrics:   Height: 59" Weight:  11/27/22  138.4 11/18/22  141.7# 11/17/22  151.6# UBW: 170-180 BMI: 27.95   Estimated Nutritional Needs:    Kcal:  1500-1700   Protein:  75-90g   Fluid:  1.5L/day  NUTRITION DIAGNOSIS: Inadequate intake continues   MALNUTRITION DIAGNOSIS: Moderate Malnutrition related to chronic illness, cancer and cancer related treatments as evidenced by mild fat depletion, moderate muscle depletion.    INTERVENTION:  Relayed that nutrition services are wrap around service provided at no charge and encouraged continued communication if experiencing continued weight loss or any nutritional impact symptoms (NIS). Reviewed strategies for bowel regularity and diarrhea management.  Encouraged small frequent feeds and trying to eat 6 small  meals. Suggested use of Greek yogurt as lean protein source. Encouraged use of room temperature ONS week after chemo when she has difficult eating and using Ensure Complete to boost protein. Encouraged use of MVI until variety of foods can be increased. Cautioned against use of treadmill and extensive exercise until weight can be stabilized.  Emailed Nutrition Tip sheet  for  managing nausea and anemia with contact information provided.    Will mail coupons and encouraged patient to ask for them when she goes for next treatment.     MONITORING, EVALUATION, GOAL: weight, PO intake, Nutrition Impact Symptoms, labs Goal is weight maintenance   NEXT VISIT: Remote in during infusion   April Manson, RDN, LDN Registered Dietitian, [place] (Usual office hours: Tuesday-Thursday) Mobile: [phone]

## 2022-11-29 ENCOUNTER — Encounter: Payer: Self-pay | Admitting: Hematology

## 2022-12-03 ENCOUNTER — Other Ambulatory Visit: Payer: Self-pay | Admitting: Oncology

## 2022-12-03 ENCOUNTER — Other Ambulatory Visit: Payer: Self-pay

## 2022-12-03 DIAGNOSIS — G893 Neoplasm related pain (acute) (chronic): Secondary | ICD-10-CM

## 2022-12-03 DIAGNOSIS — R1084 Generalized abdominal pain: Secondary | ICD-10-CM

## 2022-12-03 DIAGNOSIS — C786 Secondary malignant neoplasm of retroperitoneum and peritoneum: Secondary | ICD-10-CM

## 2022-12-03 MED ORDER — FENTANYL 75 MCG/HR TD PT72: 1.0000 | MEDICATED_PATCH | TRANSDERMAL | 0 refills | Status: DC

## 2022-12-04 ENCOUNTER — Encounter: Payer: Self-pay | Admitting: Hematology

## 2022-12-04 MED FILL — Dexamethasone Sodium Phosphate Inj 100 MG/10ML: INTRAMUSCULAR | Qty: 1 | Status: AC

## 2022-12-05 ENCOUNTER — Inpatient Hospital Stay: Payer: Medicaid Other

## 2022-12-05 ENCOUNTER — Encounter: Payer: Self-pay | Admitting: Oncology

## 2022-12-05 VITALS — BP 134/82 | HR 105 | Temp 99.0°F | Resp 22 | Ht 59.0 in | Wt 147.0 lb

## 2022-12-05 DIAGNOSIS — I10 Essential (primary) hypertension: Secondary | ICD-10-CM | POA: Diagnosis not present

## 2022-12-05 DIAGNOSIS — Z5111 Encounter for antineoplastic chemotherapy: Secondary | ICD-10-CM | POA: Diagnosis not present

## 2022-12-05 DIAGNOSIS — R710 Precipitous drop in hematocrit: Secondary | ICD-10-CM | POA: Diagnosis not present

## 2022-12-05 DIAGNOSIS — C786 Secondary malignant neoplasm of retroperitoneum and peritoneum: Secondary | ICD-10-CM | POA: Diagnosis not present

## 2022-12-05 DIAGNOSIS — C7963 Secondary malignant neoplasm of bilateral ovaries: Secondary | ICD-10-CM | POA: Diagnosis not present

## 2022-12-05 DIAGNOSIS — C259 Malignant neoplasm of pancreas, unspecified: Secondary | ICD-10-CM | POA: Diagnosis not present

## 2022-12-05 MED ORDER — PALONOSETRON HCL INJECTION 0.25 MG/5ML
0.2500 mg | Freq: Once | INTRAVENOUS | Status: AC
  Administered 2022-12-05: 0.25 mg via INTRAVENOUS
  Filled 2022-12-05: qty 5

## 2022-12-05 MED ORDER — HEPARIN SOD (PORK) LOCK FLUSH 100 UNIT/ML IV SOLN: 500.0000 [IU] | Freq: Once | INTRAVENOUS | Status: DC | PRN

## 2022-12-05 MED ORDER — LEUCOVORIN CALCIUM INJECTION 350 MG
400.0000 mg/m2 | Freq: Once | INTRAVENOUS | Status: AC
  Administered 2022-12-05: 672 mg via INTRAVENOUS
  Filled 2022-12-05: qty 33.6

## 2022-12-05 MED ORDER — SODIUM CHLORIDE 0.9% FLUSH: 10.0000 mL | INTRAVENOUS | Status: DC | PRN

## 2022-12-05 MED ORDER — IRINOTECAN HCL CHEMO INJECTION 100 MG/5ML
150.0000 mg/m2 | Freq: Once | INTRAVENOUS | Status: AC
  Administered 2022-12-05: 260 mg via INTRAVENOUS
  Filled 2022-12-05: qty 5

## 2022-12-05 MED ORDER — OXALIPLATIN CHEMO INJECTION 100 MG/20ML
85.0000 mg/m2 | Freq: Once | INTRAVENOUS | Status: AC
  Administered 2022-12-05: 150 mg via INTRAVENOUS
  Filled 2022-12-05: qty 20

## 2022-12-05 MED ORDER — SODIUM CHLORIDE 0.9 % IV SOLN
10.0000 mg | Freq: Once | INTRAVENOUS | Status: AC
  Administered 2022-12-05: 10 mg via INTRAVENOUS
  Filled 2022-12-05: qty 10

## 2022-12-05 MED ORDER — SODIUM CHLORIDE 0.9 % IV SOLN
150.0000 mg | Freq: Once | INTRAVENOUS | Status: AC
  Administered 2022-12-05: 150 mg via INTRAVENOUS
  Filled 2022-12-05: qty 150

## 2022-12-05 MED ORDER — DEXTROSE 5 % IV SOLN: Freq: Once | INTRAVENOUS | Status: AC

## 2022-12-05 MED ORDER — SODIUM CHLORIDE 0.9 % IV SOLN
2400.0000 mg/m2 | INTRAVENOUS | Status: DC
  Administered 2022-12-05: 4050 mg via INTRAVENOUS
  Filled 2022-12-05: qty 81

## 2022-12-05 MED ORDER — ATROPINE SULFATE 1 MG/ML IV SOLN
0.5000 mg | Freq: Once | INTRAVENOUS | Status: AC | PRN
  Administered 2022-12-05: 0.5 mg via INTRAVENOUS
  Filled 2022-12-05: qty 1

## 2022-12-05 NOTE — Patient Instructions (Signed)
Fluorouracil Injection What is this medication? FLUOROURACIL (flure oh YOOR a sil) treats some types of cancer. It works by slowing down the growth of cancer cells. This medicine may be used for other purposes; ask your health care provider or pharmacist if you have questions. COMMON BRAND NAME(S): Adrucil What should I tell my care team before I take this medication? They need to know if you have any of these conditions: Blood disorders Dihydropyrimidine dehydrogenase (DPD) deficiency Infection, such as chickenpox, cold sores, herpes Kidney disease Liver disease Poor nutrition Recent or ongoing radiation therapy An unusual or allergic reaction to fluorouracil, other medications, foods, dyes, or preservatives If you or your partner are pregnant or trying to get pregnant Breast-feeding How should I use this medication? This medication is injected into a vein. It is administered by your care team in a hospital or clinic setting. Talk to your care team about the use of this medication in children. Special care may be needed. Overdosage: If you think you have taken too much of this medicine contact a poison control center or emergency room at once. NOTE: This medicine is only for you. Do not share this medicine with others. What if I miss a dose? Keep appointments for follow-up doses. It is important not to miss your dose. Call your care team if you are unable to keep an appointment. What may interact with this medication? Do not take this medication with any of the following: Live virus vaccines This medication may also interact with the following: Medications that treat or prevent blood clots, such as warfarin, enoxaparin, dalteparin This list may not describe all possible interactions. Give your health care provider a list of all the medicines, herbs, non-prescription drugs, or dietary supplements you use. Also tell them if you smoke, drink alcohol, or use illegal drugs. Some items may  interact with your medicine. What should I watch for while using this medication? Your condition will be monitored carefully while you are receiving this medication. This medication may make you feel generally unwell. This is not uncommon as chemotherapy can affect healthy cells as well as cancer cells. Report any side effects. Continue your course of treatment even though you feel ill unless your care team tells you to stop. In some cases, you may be given additional medications to help with side effects. Follow all directions for their use. This medication may increase your risk of getting an infection. Call your care team for advice if you get a fever, chills, sore throat, or other symptoms of a cold or flu. Do not treat yourself. Try to avoid being around people who are sick. This medication may increase your risk to bruise or bleed. Call your care team if you notice any unusual bleeding. Be careful brushing or flossing your teeth or using a toothpick because you may get an infection or bleed more easily. If you have any dental work done, tell your dentist you are receiving this medication. Avoid taking medications that contain aspirin, acetaminophen, ibuprofen, naproxen, or ketoprofen unless instructed by your care team. These medications may hide a fever. Do not treat diarrhea with over the counter products. Contact your care team if you have diarrhea that lasts more than 2 days or if it is severe and watery. This medication can make you more sensitive to the sun. Keep out of the sun. If you cannot avoid being in the sun, wear protective clothing and sunscreen. Do not use sun lamps, tanning beds, or tanning booths. Talk to   your care team if you or your partner wish to become pregnant or think you might be pregnant. This medication can cause serious birth defects if taken during pregnancy and for 3 months after the last dose. A reliable form of contraception is recommended while taking this  medication and for 3 months after the last dose. Talk to your care team about effective forms of contraception. Do not father a child while taking this medication and for 3 months after the last dose. Use a condom while having sex during this time period. Do not breastfeed while taking this medication. This medication may cause infertility. Talk to your care team if you are concerned about your fertility. What side effects may I notice from receiving this medication? Side effects that you should report to your care team as soon as possible: Allergic reactions--skin rash, itching, hives, swelling of the face, lips, tongue, or throat Heart attack--pain or tightness in the chest, shoulders, arms, or jaw, nausea, shortness of breath, cold or clammy skin, feeling faint or lightheaded Heart failure--shortness of breath, swelling of the ankles, feet, or hands, sudden weight gain, unusual weakness or fatigue Heart rhythm changes--fast or irregular heartbeat, dizziness, feeling faint or lightheaded, chest pain, trouble breathing High ammonia level--unusual weakness or fatigue, confusion, loss of appetite, nausea, vomiting, seizures Infection--fever, chills, cough, sore throat, wounds that don't heal, pain or trouble when passing urine, general feeling of discomfort or being unwell Low red blood cell level--unusual weakness or fatigue, dizziness, headache, trouble breathing Pain, tingling, or numbness in the hands or feet, muscle weakness, change in vision, confusion or trouble speaking, loss of balance or coordination, trouble walking, seizures Redness, swelling, and blistering of the skin over hands and feet Severe or prolonged diarrhea Unusual bruising or bleeding Side effects that usually do not require medical attention (report to your care team if they continue or are bothersome): Dry skin Headache Increased tears Nausea Pain, redness, or swelling with sores inside the mouth or throat Sensitivity  to light Vomiting This list may not describe all possible side effects. Call your doctor for medical advice about side effects. You may report side effects to FDA at 1-800-FDA-1088. Where should I keep my medication? This medication is given in a hospital or clinic. It will not be stored at home. NOTE: This sheet is a summary. It may not cover all possible information. If you have questions about this medicine, talk to your doctor, pharmacist, or health care provider.  2023 [employer] Standard (2022-01-02 00:00:00) Leucovorin Injection What is this medication? LEUCOVORIN (loo koe VOR in) prevents side effects from certain medications, such as methotrexate. It works by increasing folate levels. This helps protect healthy cells in your body. It may also be used to treat anemia caused by low levels of folate. It can also be used with fluorouracil, a type of chemotherapy, to treat colorectal cancer. It works by increasing the effects of fluorouracil in the body. This medicine may be used for other purposes; ask your health care provider or pharmacist if you have questions. What should I tell my care team before I take this medication? They need to know if you have any of these conditions: Anemia from low levels of vitamin B12 in the blood An unusual or allergic reaction to leucovorin, folic acid, other medications, foods, dyes, or preservatives Pregnant or trying to get pregnant Breastfeeding How should I use this medication? This medication is injected into a vein or a muscle. It is given by your care team  in a hospital or clinic setting. Talk to your care team about the use of this medication in children. Special care may be needed. Overdosage: If you think you have taken too much of this medicine contact a poison control center or emergency room at once. NOTE: This medicine is only for you. Do not share this medicine with others. What if I miss a dose? Keep appointments for follow-up doses.  It is important not to miss your dose. Call your care team if you are unable to keep an appointment. What may interact with this medication? Capecitabine Fluorouracil Phenobarbital Phenytoin Primidone Trimethoprim;sulfamethoxazole This list may not describe all possible interactions. Give your health care provider a list of all the medicines, herbs, non-prescription drugs, or dietary supplements you use. Also tell them if you smoke, drink alcohol, or use illegal drugs. Some items may interact with your medicine. What should I watch for while using this medication? Your condition will be monitored carefully while you are receiving this medication. This medication may increase the side effects of 5-fluorouracil. Tell your care team if you have diarrhea or mouth sores that do not get better or that get worse. What side effects may I notice from receiving this medication? Side effects that you should report to your care team as soon as possible: Allergic reactions--skin rash, itching, hives, swelling of the face, lips, tongue, or throat This list may not describe all possible side effects. Call your doctor for medical advice about side effects. You may report side effects to FDA at 1-800-FDA-1088. Where should I keep my medication? This medication is given in a hospital or clinic. It will not be stored at home. NOTE: This sheet is a summary. It may not cover all possible information. If you have questions about this medicine, talk to your doctor, pharmacist, or health care provider.  2023 [employer]/Gold Standard (2022-01-12 00:00:00) Irinotecan Injection What is this medication? IRINOTECAN (ir in oh TEE kan) treats some types of cancer. It works by slowing down the growth of cancer cells. This medicine may be used for other purposes; ask your health care provider or pharmacist if you have questions. COMMON BRAND NAME(S): Camptosar What should I tell my care team before I take this  medication? They need to know if you have any of these conditions: Dehydration Diarrhea Infection, especially a viral infection, such as chickenpox, cold sores, herpes Liver disease Low blood cell levels (white cells, red cells, and platelets) Low levels of electrolytes, such as calcium, magnesium, or potassium in your blood Recent or ongoing radiation An unusual or allergic reaction to irinotecan, other medications, foods, dyes, or preservatives If you or your partner are pregnant or trying to get pregnant Breast-feeding How should I use this medication? This medication is injected into a vein. It is given by your care team in a hospital or clinic setting. Talk to your care team about the use of this medication in children. Special care may be needed. Overdosage: If you think you have taken too much of this medicine contact a poison control center or emergency room at once. NOTE: This medicine is only for you. Do not share this medicine with others. What if I miss a dose? Keep appointments for follow-up doses. It is important not to miss your dose. Call your care team if you are unable to keep an appointment. What may interact with this medication? Do not take this medication with any of the following: Cobicistat Itraconazole This medication may also interact with the following:  Certain antibiotics, such as clarithromycin, rifampin, rifabutin Certain antivirals for HIV or AIDS Certain medications for fungal infections, such as ketoconazole, posaconazole, voriconazole Certain medications for seizures, such as carbamazepine, phenobarbital, phenytoin Gemfibrozil Nefazodone St. John's wort This list may not describe all possible interactions. Give your health care provider a list of all the medicines, herbs, non-prescription drugs, or dietary supplements you use. Also tell them if you smoke, drink alcohol, or use illegal drugs. Some items may interact with your medicine. What should I  watch for while using this medication? Your condition will be monitored carefully while you are receiving this medication. You may need blood work while taking this medication. This medication may make you feel generally unwell. This is not uncommon as chemotherapy can affect healthy cells as well as cancer cells. Report any side effects. Continue your course of treatment even though you feel ill unless your care team tells you to stop. This medication can cause serious side effects. To reduce the risk, your care team may give you other medications to take before receiving this one. Be sure to follow the directions from your care team. This medication may affect your coordination, reaction time, or judgement. Do not drive or operate machinery until you know how this medication affects you. Sit up or stand slowly to reduce the risk of dizzy or fainting spells. Drinking alcohol with this medication can increase the risk of these side effects. This medication may increase your risk of getting an infection. Call your care team for advice if you get a fever, chills, sore throat, or other symptoms of a cold or flu. Do not treat yourself. Try to avoid being around people who are sick. Avoid taking medications that contain aspirin, acetaminophen, ibuprofen, naproxen, or ketoprofen unless instructed by your care team. These medications may hide a fever. This medication may increase your risk to bruise or bleed. Call your care team if you notice any unusual bleeding. Be careful brushing or flossing your teeth or using a toothpick because you may get an infection or bleed more easily. If you have any dental work done, tell your dentist you are receiving this medication. Talk to your care team if you or your partner are pregnant or think either of you might be pregnant. This medication can cause serious birth defects if taken during pregnancy and for 6 months after the last dose. You will need a negative pregnancy test  before starting this medication. Contraception is recommended while taking this medication and for 6 months after the last dose. Your care team can help you find the option that works for you. Do not father a child while taking this medication and for 3 months after the last dose. Use a condom for contraception during this time period. Do not breastfeed while taking this medication and for 7 days after the last dose. This medication may cause infertility. Talk to your care team if you are concerned about your fertility. What side effects may I notice from receiving this medication? Side effects that you should report to your care team as soon as possible: Allergic reactions--skin rash, itching, hives, swelling of the face, lips, tongue, or throat Dry cough, shortness of breath or trouble breathing Increased saliva or tears, increased sweating, stomach cramping, diarrhea, small pupils, unusual weakness or fatigue, slow heartbeat Infection--fever, chills, cough, sore throat, wounds that don't heal, pain or trouble when passing urine, general feeling of discomfort or being unwell Kidney injury--decrease in the amount of urine, swelling of the  ankles, hands, or feet Low red blood cell level--unusual weakness or fatigue, dizziness, headache, trouble breathing Severe or prolonged diarrhea Unusual bruising or bleeding Side effects that usually do not require medical attention (report to your care team if they continue or are bothersome): Constipation Diarrhea Hair loss Loss of appetite Nausea Stomach pain This list may not describe all possible side effects. Call your doctor for medical advice about side effects. You may report side effects to FDA at 1-800-FDA-1088. Where should I keep my medication? This medication is given in a hospital or clinic. It will not be stored at home. NOTE: This sheet is a summary. It may not cover all possible information. If you have questions about this medicine, talk  to your doctor, pharmacist, or health care provider.  2023 [employer]/Gold Standard (2022-01-11 00:00:00) Oxaliplatin Injection What is this medication? OXALIPLATIN (ox AL i PLA tin) treats colorectal cancer. It works by slowing down the growth of cancer cells. This medicine may be used for other purposes; ask your health care provider or pharmacist if you have questions. COMMON BRAND NAME(S): Eloxatin What should I tell my care team before I take this medication? They need to know if you have any of these conditions: Heart disease History of irregular heartbeat or rhythm Liver disease Low blood cell levels (white cells, red cells, and platelets) Lung or breathing disease, such as asthma Take medications that treat or prevent blood clots Tingling of the fingers, toes, or other nerve disorder An unusual or allergic reaction to oxaliplatin, other medications, foods, dyes, or preservatives If you or your partner are pregnant or trying to get pregnant Breast-feeding How should I use this medication? This medication is injected into a vein. It is given by your care team in a hospital or clinic setting. Talk to your care team about the use of this medication in children. Special care may be needed. Overdosage: If you think you have taken too much of this medicine contact a poison control center or emergency room at once. NOTE: This medicine is only for you. Do not share this medicine with others. What if I miss a dose? Keep appointments for follow-up doses. It is important not to miss a dose. Call your care team if you are unable to keep an appointment. What may interact with this medication? Do not take this medication with any of the following: Cisapride Dronedarone Pimozide Thioridazine This medication may also interact with the following: Aspirin and aspirin-like medications Certain medications that treat or prevent blood clots, such as warfarin, apixaban, dabigatran, and  rivaroxaban Cisplatin Cyclosporine Diuretics Medications for infection, such as acyclovir, adefovir, amphotericin B, bacitracin, cidofovir, foscarnet, ganciclovir, gentamicin, pentamidine, vancomycin NSAIDs, medications for pain and inflammation, such as ibuprofen or naproxen Other medications that cause heart rhythm changes Pamidronate Zoledronic acid This list may not describe all possible interactions. Give your health care provider a list of all the medicines, herbs, non-prescription drugs, or dietary supplements you use. Also tell them if you smoke, drink alcohol, or use illegal drugs. Some items may interact with your medicine. What should I watch for while using this medication? Your condition will be monitored carefully while you are receiving this medication. You may need blood work while taking this medication. This medication may make you feel generally unwell. This is not uncommon as chemotherapy can affect healthy cells as well as cancer cells. Report any side effects. Continue your course of treatment even though you feel ill unless your care team tells you to stop.  This medication may increase your risk of getting an infection. Call your care team for advice if you get a fever, chills, sore throat, or other symptoms of a cold or flu. Do not treat yourself. Try to avoid being around people who are sick. Avoid taking medications that contain aspirin, acetaminophen, ibuprofen, naproxen, or ketoprofen unless instructed by your care team. These medications may hide a fever. Be careful brushing or flossing your teeth or using a toothpick because you may get an infection or bleed more easily. If you have any dental work done, tell your dentist you are receiving this medication. This medication can make you more sensitive to cold. Do not drink cold drinks or use ice. Cover exposed skin before coming in contact with cold temperatures or cold objects. When out in cold weather wear warm clothing  and cover your mouth and nose to warm the air that goes into your lungs. Tell your care team if you get sensitive to the cold. Talk to your care team if you or your partner are pregnant or think either of you might be pregnant. This medication can cause serious birth defects if taken during pregnancy and for 9 months after the last dose. A negative pregnancy test is required before starting this medication. A reliable form of contraception is recommended while taking this medication and for 9 months after the last dose. Talk to your care team about effective forms of contraception. Do not father a child while taking this medication and for 6 months after the last dose. Use a condom while having sex during this time period. Do not breastfeed while taking this medication and for 3 months after the last dose. This medication may cause infertility. Talk to your care team if you are concerned about your fertility. What side effects may I notice from receiving this medication? Side effects that you should report to your care team as soon as possible: Allergic reactions--skin rash, itching, hives, swelling of the face, lips, tongue, or throat Bleeding--bloody or black, tar-like stools, vomiting blood or brown material that looks like coffee grounds, red or dark brown urine, small red or purple spots on skin, unusual bruising or bleeding Dry cough, shortness of breath or trouble breathing Heart rhythm changes--fast or irregular heartbeat, dizziness, feeling faint or lightheaded, chest pain, trouble breathing Infection--fever, chills, cough, sore throat, wounds that don't heal, pain or trouble when passing urine, general feeling of discomfort or being unwell Liver injury--right upper belly pain, loss of appetite, nausea, light-colored stool, dark yellow or brown urine, yellowing skin or eyes, unusual weakness or fatigue Low red blood cell level--unusual weakness or fatigue, dizziness, headache, trouble  breathing Muscle injury--unusual weakness or fatigue, muscle pain, dark yellow or brown urine, decrease in amount of urine Pain, tingling, or numbness in the hands or feet Sudden and severe headache, confusion, change in vision, seizures, which may be signs of posterior reversible encephalopathy syndrome (PRES) Unusual bruising or bleeding Side effects that usually do not require medical attention (report to your care team if they continue or are bothersome): Diarrhea Nausea Pain, redness, or swelling with sores inside the mouth or throat Unusual weakness or fatigue Vomiting This list may not describe all possible side effects. Call your doctor for medical advice about side effects. You may report side effects to FDA at 1-800-FDA-1088. Where should I keep my medication? This medication is given in a hospital or clinic. It will not be stored at home. NOTE: This sheet is a summary. It may  not cover all possible information. If you have questions about this medicine, talk to your doctor, pharmacist, or health care provider.  2023 [employer] Standard (2007-10-25 00:00:00)

## 2022-12-05 NOTE — Progress Notes (Signed)
PT BECAME SWEATY AND NAUSEATED AT 1445 WHILE IRINOTECAN WAS INFUSING.  GAVE ATROPINE AND PT WAS ABLE TO RESUME IRINOTECAN INFUSION WITH NO FURTHER PROBLEMS.  SWEATING AND NAUSEA RESOLVED COMPLETELY.  PHARMACY AWARE.

## 2022-12-06 MED FILL — Ferumoxytol Inj 510 MG/17ML (30 MG/ML) (Elemental Fe): INTRAVENOUS | Qty: 17 | Status: AC

## 2022-12-07 ENCOUNTER — Inpatient Hospital Stay: Payer: Medicaid Other

## 2022-12-07 VITALS — BP 128/81 | HR 106 | Temp 98.3°F | Resp 18 | Ht 59.0 in | Wt 142.1 lb

## 2022-12-07 DIAGNOSIS — D500 Iron deficiency anemia secondary to blood loss (chronic): Secondary | ICD-10-CM

## 2022-12-07 DIAGNOSIS — Z5111 Encounter for antineoplastic chemotherapy: Secondary | ICD-10-CM | POA: Diagnosis not present

## 2022-12-07 DIAGNOSIS — C786 Secondary malignant neoplasm of retroperitoneum and peritoneum: Secondary | ICD-10-CM

## 2022-12-07 MED ORDER — SODIUM CHLORIDE 0.9 % IV SOLN: Freq: Once | INTRAVENOUS | Status: AC

## 2022-12-07 MED ORDER — SODIUM CHLORIDE 0.9% FLUSH
10.0000 mL | INTRAVENOUS | Status: DC | PRN
  Administered 2022-12-07: 10 mL

## 2022-12-07 MED ORDER — PROCHLORPERAZINE MALEATE 10 MG PO TABS
10.0000 mg | ORAL_TABLET | Freq: Once | ORAL | Status: AC
  Administered 2022-12-07: 10 mg via ORAL
  Filled 2022-12-07: qty 1

## 2022-12-07 MED ORDER — HEPARIN SOD (PORK) LOCK FLUSH 100 UNIT/ML IV SOLN
500.0000 [IU] | Freq: Once | INTRAVENOUS | Status: AC | PRN
  Administered 2022-12-07: 500 [IU]

## 2022-12-07 MED ORDER — SODIUM CHLORIDE 0.9 % IV SOLN
510.0000 mg | Freq: Once | INTRAVENOUS | Status: AC
  Administered 2022-12-07: 510 mg via INTRAVENOUS
  Filled 2022-12-07: qty 510

## 2022-12-07 NOTE — Patient Instructions (Signed)

## 2022-12-11 ENCOUNTER — Encounter: Payer: Self-pay | Admitting: Oncology

## 2022-12-11 MED FILL — Ferumoxytol Inj 510 MG/17ML (30 MG/ML) (Elemental Fe): INTRAVENOUS | Qty: 17 | Status: AC

## 2022-12-12 ENCOUNTER — Inpatient Hospital Stay: Payer: Medicaid Other

## 2022-12-12 ENCOUNTER — Ambulatory Visit: Payer: Medicaid Other | Admitting: Dietician

## 2022-12-12 NOTE — Progress Notes (Signed)
Nutrition Follow-up:  Called patient at home telephone number to follow up on PO intake and NIS. She reports feeling weak but eating and drinking better and being very attentive to fluids.  Drinking Ensure (plus and complete) TID Gatorade (4 bottles) and water  She is using Compazine and getting some relief with nausea.  Her bowels are very loose and frequent.  PO recently includes: the three Ensure and BLT, also made a smoothie with banana and peanut butter powder.  Medications: Compazine for nausea  Labs: No new labs since 11/17/22    Anthropometrics:   Height: 59" Weight:  12/07/22  142# 12/05/22  147# 11/27/22  138.4 11/18/22  141.7# 11/17/22  151.6# UBW: 170-180 BMI: 28.7   Estimated Nutritional Needs:    Kcal:  1500-1700   Protein:  75-90 g   Fluid:  1.5L/day  NUTRITION DIAGNOSIS: Inadequate intake Improving with some fluctuations in intake and weight   MALNUTRITION DIAGNOSIS: Moderate Malnutrition related to chronic illness, cancer and cancer related treatments as evidenced by mild fat depletion, moderate muscle depletion.    INTERVENTION:  Reviewed strategies for diarrhea management.  Encouraged small frequent feeds and trying to eat 6 small meals. Suggested use of Greek yogurt as lean protein source when she can tolerate cold Encouraged increase in soluble fibers to solidify stool.  Will provide Banatrol samples next week prior to infusion.   MONITORING, EVALUATION, GOAL: weight, PO intake, Nutrition Impact Symptoms, labs Goal is weight maintenance   NEXT VISIT: Remote next month   Cyndi Chantrice Hagg, RDN, LDN Registered Dietitian, [place] (Usual office hours: Tuesday-Thursday) Mobile: [phone]

## 2022-12-13 ENCOUNTER — Encounter: Payer: Self-pay | Admitting: Oncology

## 2022-12-13 MED FILL — Ferumoxytol Inj 510 MG/17ML (30 MG/ML) (Elemental Fe): INTRAVENOUS | Qty: 17 | Status: AC

## 2022-12-14 ENCOUNTER — Inpatient Hospital Stay: Payer: Medicaid Other

## 2022-12-14 VITALS — BP 128/88 | HR 98 | Temp 98.7°F | Resp 18 | Wt 133.1 lb

## 2022-12-14 DIAGNOSIS — D500 Iron deficiency anemia secondary to blood loss (chronic): Secondary | ICD-10-CM

## 2022-12-14 DIAGNOSIS — Z5111 Encounter for antineoplastic chemotherapy: Secondary | ICD-10-CM | POA: Diagnosis not present

## 2022-12-14 MED ORDER — SODIUM CHLORIDE 0.9 % IV SOLN: Freq: Once | INTRAVENOUS | Status: AC

## 2022-12-14 MED ORDER — SODIUM CHLORIDE 0.9% FLUSH
10.0000 mL | Freq: Once | INTRAVENOUS | Status: AC | PRN
  Administered 2022-12-14: 10 mL

## 2022-12-14 MED ORDER — HEPARIN SOD (PORK) LOCK FLUSH 100 UNIT/ML IV SOLN
500.0000 [IU] | Freq: Once | INTRAVENOUS | Status: AC | PRN
  Administered 2022-12-14: 500 [IU]

## 2022-12-14 MED ORDER — SODIUM CHLORIDE 0.9 % IV SOLN
510.0000 mg | Freq: Once | INTRAVENOUS | Status: AC
  Administered 2022-12-14: 510 mg via INTRAVENOUS
  Filled 2022-12-14: qty 510

## 2022-12-14 NOTE — Patient Instructions (Signed)

## 2022-12-16 NOTE — Progress Notes (Unsigned)
[facility]  [address] [place],  [place]  [address] [phone]  Clinic Day:  12/17/2022  Referring physician: [name], MD  HISTORY OF PRESENT ILLNESS:  The patient is a 47 y.o. female  with metastatic pancreatic cancer, with spread of disease to her peritoneum, uterus and ovaries.  Due to her malignant ascites, she has needed multiple paracenteses.  She comes in today to be evaluated before heading into her 4th cycle of FOLFIRINOX.  The patient claims to have tolerated her 3rd cycle of FOLFIRINOX fairly well.  She only had a minimal amount of nausea with her last cycle of chemotherapy.  Of note, this patient has not needed paracentesis in nearly a month.  She is on Eliquis due to a small right-sided pulmonary embolus seen on February 2024 CT scans.    Her history dates back to January 2024 when she first began having lower quadrant abdominal pain.  Over a span of weeks, she began having abdominal swelling and distention.  She also began having increased constipation.  This constellation of symptoms led to her undergoing CT scans of her abdomen/pelvis, which reveal a 7.9 cm cystic pancreatic lesion, with an irregular stomach border.  There was extensive ascites, peritoneal carcinomatosis, bilateral adnexal masses and uterine involvement.  The patient underwent a paracentesis in early February 2024, which revealed malignant cells, consistent with adenocarcinoma.  She also underwent an endometrial biopsy, which revealed metastatic adenocarcinoma.  In particular, immunohistochemical stains were positive for CK7/CDX2/CK20.  PAX8 and ER staining was negative. This pattern was consistent with a malignancy from an upper GI/pancreaticobiliary origin.  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   PHYSICAL EXAM:  Blood pressure (!) 142/92, pulse (!) 107, temperature 98.7 F (37.1 C), resp. rate 16, height 4\' 11"  (1.499 m), weight  134 lb 4.8 oz (60.9 kg), SpO2 97 %, unknown if currently breastfeeding. Wt Readings from Last 3 Encounters:  12/17/22 134 lb 4.8 oz (60.9 kg)  12/14/22 133 lb 1.3 oz (60.4 kg)  12/07/22 142 lb 1.9 oz (64.5 kg)   Body mass index is 27.13 kg/m. Performance status (ECOG): 2 - Symptomatic, <50% confined to bed Physical Exam Constitutional:      Appearance: Normal appearance.     Comments: She appears stronger and healthier vs previous visits  HENT:     Mouth/Throat:     Mouth: Mucous membranes are moist.     Pharynx: Oropharynx is clear. No oropharyngeal exudate or posterior oropharyngeal erythema.  Cardiovascular:     Rate and Rhythm: Normal rate and regular rhythm.     Heart sounds: No murmur heard.    No friction rub. No gallop.  Pulmonary:     Effort: Pulmonary effort is normal. No respiratory distress.     Breath sounds: Normal breath sounds. No wheezing, rhonchi or rales.  Abdominal:     General: Bowel sounds are normal. There is distension.     Palpations: Abdomen is soft. There is no mass.     Tenderness: There is no abdominal tenderness.  Musculoskeletal:        General: No swelling.     Right lower leg: No edema.     Left lower leg: No edema.  Lymphadenopathy:     Cervical: No cervical adenopathy.     Upper Body:     Right upper body: No supraclavicular or axillary adenopathy.     Left upper body: No supraclavicular or axillary adenopathy.  Lower Body: No right inguinal adenopathy. No left inguinal adenopathy.  Skin:    General: Skin is warm.     Coloration: Skin is not jaundiced.     Findings: No lesion or rash.  Neurological:     General: No focal deficit present.     Mental Status: She is alert and oriented to person, place, and time. Mental status is at baseline.  Psychiatric:        Mood and Affect: Mood normal.        Behavior: Behavior normal.        Thought Content: Thought content normal.    LABS:      Latest Ref Rng & Units 12/17/2022   12:00 AM  11/17/2022    6:15 AM 11/14/2022    8:16 AM  CBC  WBC  2.3     7.7  18.2   Hemoglobin 12.0 - 16.0 8.9     9.4  8.3   Hematocrit 36 - 46 29     30.7  26.9   Platelets 150 - 400 K/uL 289     330  346      This result is from an external source.      Latest Ref Rng & Units 11/17/2022    6:15 AM 11/15/2022    6:40 AM 11/13/2022    5:00 AM  CMP  Glucose 70 - 99 mg/dL 179  225  156   BUN 6 - 20 mg/dL 24  16  16    Creatinine 0.44 - 1.00 mg/dL 0.58  0.44  0.46   Sodium 135 - 145 mmol/L 130  130  133   Potassium 3.5 - 5.1 mmol/L 3.9  4.1  3.9   Chloride 98 - 111 mmol/L 93  97  101   CO2 22 - 32 mmol/L 27  26  27    Calcium 8.9 - 10.3 mg/dL 7.8  8.0  7.6   Total Protein 6.5 - 8.1 g/dL 5.1  5.2    Total Bilirubin 0.3 - 1.2 mg/dL 0.2  0.4    Alkaline Phos 38 - 126 U/L 91  98    AST 15 - 41 U/L 16  18    ALT 0 - 44 U/L 40  44     ASSESSMENT & PLAN:  A 47 y.o. female with metastatic pancreatic cancer.  She will proceed with her 4th cycle of FOLFIRINOX later this week.  Clinically, this patient does appear to be doing better; her baseline health has improved with each successive cycle of chemotherapy.  I will see her back in 2 weeks to evaluate her before she heads into her fifth cycle of FOLFIRINOX chemotherapy.  CT scans will be done a day before her next visit to ascertain her new disease baseline after 4 cycles of FOLFIRINOX chemotherapy.  The patient understands all the plans discussed today and is in agreement with them.    Laddie Naeem Macarthur Critchley, MD

## 2022-12-17 ENCOUNTER — Other Ambulatory Visit: Payer: Self-pay | Admitting: Oncology

## 2022-12-17 ENCOUNTER — Inpatient Hospital Stay: Payer: Medicaid Other | Attending: Oncology | Admitting: Oncology

## 2022-12-17 ENCOUNTER — Inpatient Hospital Stay: Payer: Medicaid Other

## 2022-12-17 DIAGNOSIS — E876 Hypokalemia: Secondary | ICD-10-CM

## 2022-12-17 DIAGNOSIS — Z5189 Encounter for other specified aftercare: Secondary | ICD-10-CM | POA: Insufficient documentation

## 2022-12-17 DIAGNOSIS — C259 Malignant neoplasm of pancreas, unspecified: Secondary | ICD-10-CM | POA: Insufficient documentation

## 2022-12-17 DIAGNOSIS — C786 Secondary malignant neoplasm of retroperitoneum and peritoneum: Secondary | ICD-10-CM

## 2022-12-17 DIAGNOSIS — Z5111 Encounter for antineoplastic chemotherapy: Secondary | ICD-10-CM | POA: Insufficient documentation

## 2022-12-17 DIAGNOSIS — R180 Malignant ascites: Secondary | ICD-10-CM | POA: Insufficient documentation

## 2022-12-17 LAB — CBC: RBC: 3.83 — AB (ref 3.87–5.11)

## 2022-12-17 LAB — CBC AND DIFFERENTIAL
HCT: 29 — AB (ref 36–46)
Hemoglobin: 8.9 — AB (ref 12.0–16.0)
Neutrophils Absolute: 0.64
Platelets: 289 10*3/uL (ref 150–400)
WBC: 2.3

## 2022-12-17 MED ORDER — FENTANYL 75 MCG/HR TD PT72: 1.0000 | MEDICATED_PATCH | TRANSDERMAL | 0 refills | Status: DC

## 2022-12-18 ENCOUNTER — Other Ambulatory Visit: Payer: Self-pay

## 2022-12-18 ENCOUNTER — Inpatient Hospital Stay: Payer: Medicaid Other

## 2022-12-18 VITALS — BP 135/85 | HR 108 | Temp 98.6°F | Resp 18 | Ht 59.0 in | Wt 134.3 lb

## 2022-12-18 DIAGNOSIS — R180 Malignant ascites: Secondary | ICD-10-CM | POA: Diagnosis not present

## 2022-12-18 DIAGNOSIS — C786 Secondary malignant neoplasm of retroperitoneum and peritoneum: Secondary | ICD-10-CM

## 2022-12-18 DIAGNOSIS — Z5189 Encounter for other specified aftercare: Secondary | ICD-10-CM | POA: Diagnosis not present

## 2022-12-18 DIAGNOSIS — C259 Malignant neoplasm of pancreas, unspecified: Secondary | ICD-10-CM | POA: Diagnosis not present

## 2022-12-18 DIAGNOSIS — D500 Iron deficiency anemia secondary to blood loss (chronic): Secondary | ICD-10-CM

## 2022-12-18 DIAGNOSIS — Z5111 Encounter for antineoplastic chemotherapy: Secondary | ICD-10-CM | POA: Diagnosis present

## 2022-12-18 LAB — CMP (CANCER CENTER ONLY)
ALT: 21 U/L (ref 0–44)
AST: 13 U/L — ABNORMAL LOW (ref 15–41)
Albumin: 2.4 g/dL — ABNORMAL LOW (ref 3.5–5.0)
Alkaline Phosphatase: 95 U/L (ref 38–126)
Anion gap: 10 (ref 5–15)
BUN: 12 mg/dL (ref 6–20)
CO2: 24 mmol/L (ref 22–32)
Calcium: 8.5 mg/dL — ABNORMAL LOW (ref 8.9–10.3)
Chloride: 100 mmol/L (ref 98–111)
Creatinine: 0.45 mg/dL (ref 0.44–1.00)
GFR, Estimated: >60 mL/min (ref >60)
Glucose, Bld: 168 mg/dL — ABNORMAL HIGH (ref 70–99)
Potassium: 3.6 mmol/L (ref 3.5–5.1)
Sodium: 134 mmol/L — ABNORMAL LOW (ref 135–145)
Total Bilirubin: 0.2 mg/dL — ABNORMAL LOW (ref 0.3–1.2)
Total Protein: 5.9 g/dL — ABNORMAL LOW (ref 6.5–8.1)

## 2022-12-18 MED ORDER — FILGRASTIM-SNDZ 480 MCG/0.8ML IJ SOSY
480.0000 ug | PREFILLED_SYRINGE | Freq: Once | INTRAMUSCULAR | Status: AC
  Administered 2022-12-18: 480 ug via SUBCUTANEOUS
  Filled 2022-12-18: qty 0.8

## 2022-12-18 MED FILL — Fluorouracil IV Soln 5 GM/100ML (50 MG/ML): INTRAVENOUS | Qty: 77 | Status: AC

## 2022-12-18 MED FILL — Dexamethasone Sodium Phosphate Inj 100 MG/10ML: INTRAMUSCULAR | Qty: 1 | Status: AC

## 2022-12-18 MED FILL — Fosaprepitant Dimeglumine For IV Infusion 150 MG (Base Eq): INTRAVENOUS | Qty: 5 | Status: AC

## 2022-12-18 NOTE — Patient Instructions (Addendum)
Filgrastim Injection TAKE CLARITIN BY MOUTH AFTER TAKING THIS SHOT. What is this medication? FILGRASTIM (fil GRA stim) lowers the risk of infection in people who are receiving chemotherapy. It works by Building control surveyor make more white blood cells, which protects your body from infection. It may also be used to help people who have been exposed to high doses of radiation. It can be used to help prepare your body before a stem cell transplant. It works by helping your bone marrow make and release stem cells into the blood. This medicine may be used for other purposes; ask your health care provider or pharmacist if you have questions. COMMON BRAND NAME(S): Neupogen, Nivestym, Releuko, Zarxio What should I tell my care team before I take this medication? They need to know if you have any of these conditions: History of blood diseases, such as sickle cell anemia Kidney disease Recent or ongoing radiation An unusual or allergic reaction to filgrastim, pegfilgrastim, latex, rubber, other medications, foods, dyes, or preservatives Pregnant or trying to get pregnant Breast-feeding How should I use this medication? This medication is injected under the skin or into a vein. It is usually given by your care team in a hospital or clinic setting. It may be given at home. If you get this medication at home, you will be taught how to prepare and give it. Use exactly as directed. Take it as directed on the prescription label at the same time every day. Keep taking it unless your care team tells you to stop. It is important that you put your used needles and syringes in a special sharps container. Do not put them in a trash can. If you do not have a sharps container, call your pharmacist or care team to get one. This medication comes with INSTRUCTIONS FOR USE. Ask your pharmacist for directions on how to use this medication. Read the information carefully. Talk to your pharmacist or care team if you have  questions. Talk to your care team about the use of this medication in children. While it may be prescribed for children for selected conditions, precautions do apply. Overdosage: If you think you have taken too much of this medicine contact a poison control center or emergency room at once. NOTE: This medicine is only for you. Do not share this medicine with others. What if I miss a dose? It is important not to miss any doses. Talk to your care team about what to do if you miss a dose. What may interact with this medication? Medications that may cause a release of neutrophils, such as lithium This list may not describe all possible interactions. Give your health care provider a list of all the medicines, herbs, non-prescription drugs, or dietary supplements you use. Also tell them if you smoke, drink alcohol, or use illegal drugs. Some items may interact with your medicine. What should I watch for while using this medication? Your condition will be monitored carefully while you are receiving this medication. You may need bloodwork while taking this medication. Talk to your care team about your risk of cancer. You may be more at risk for certain types of cancer if you take this medication. What side effects may I notice from receiving this medication? Side effects that you should report to your care team as soon as possible: Allergic reactions--skin rash, itching, hives, swelling of the face, lips, tongue, or throat Capillary leak syndrome--stomach or muscle pain, unusual weakness or fatigue, feeling faint or lightheaded, decrease in the amount  of urine, swelling of the ankles, hands, or feet, trouble breathing High white blood cell level--fever, fatigue, trouble breathing, night sweats, change in vision, weight loss Inflammation of the aorta--fever, fatigue, back, chest, or stomach pain, severe headache Kidney injury (glomerulonephritis)--decrease in the amount of urine, red or dark brown urine,  foamy or bubbly urine, swelling of the ankles, hands, or feet Shortness of breath or trouble breathing Spleen injury--pain in upper left stomach or shoulder Unusual bruising or bleeding Side effects that usually do not require medical attention (report to your care team if they continue or are bothersome): Back pain Bone pain Fatigue Fever Headache Nausea This list may not describe all possible side effects. Call your doctor for medical advice about side effects. You may report side effects to FDA at 1-800-FDA-1088. Where should I keep my medication? Keep out of the reach of children and pets. Keep this medication in the original packaging until you are ready to take it. Protect from light. See product for storage information. Each product may have different instructions. Get rid of any unused medication after the expiration date. To get rid of medications that are no longer needed or have expired: Take the medication to a medications take-back program. Check with your pharmacy or law enforcement to find a location. If you cannot return the medication, ask your pharmacist or care team how to get rid of this medication safely. NOTE: This sheet is a summary. It may not cover all possible information. If you have questions about this medicine, talk to your doctor, pharmacist, or health care provider.  2023 [employer]/Gold Standard (2021-12-12 00:00:00)

## 2022-12-19 ENCOUNTER — Inpatient Hospital Stay: Payer: Medicaid Other

## 2022-12-19 ENCOUNTER — Ambulatory Visit: Payer: Medicaid Other

## 2022-12-19 VITALS — BP 130/80 | HR 123 | Temp 99.0°F | Resp 18 | Ht 59.0 in | Wt 136.0 lb

## 2022-12-19 DIAGNOSIS — C786 Secondary malignant neoplasm of retroperitoneum and peritoneum: Secondary | ICD-10-CM

## 2022-12-19 DIAGNOSIS — Z5111 Encounter for antineoplastic chemotherapy: Secondary | ICD-10-CM | POA: Diagnosis not present

## 2022-12-19 MED ORDER — PALONOSETRON HCL INJECTION 0.25 MG/5ML
0.2500 mg | Freq: Once | INTRAVENOUS | Status: AC
  Administered 2022-12-19: 0.25 mg via INTRAVENOUS
  Filled 2022-12-19: qty 5

## 2022-12-19 MED ORDER — SODIUM CHLORIDE 0.9 % IV SOLN
2425.0000 mg/m2 | INTRAVENOUS | Status: DC
  Administered 2022-12-19: 3850 mg via INTRAVENOUS
  Filled 2022-12-19: qty 77

## 2022-12-19 MED ORDER — OXALIPLATIN CHEMO INJECTION 50 MG/10ML
84.0000 mg/m2 | Freq: Once | INTRAVENOUS | Status: AC
  Administered 2022-12-19: 135 mg via INTRAVENOUS
  Filled 2022-12-19: qty 20

## 2022-12-19 MED ORDER — IRINOTECAN HCL CHEMO INJECTION 100 MG/5ML
150.0000 mg/m2 | Freq: Once | INTRAVENOUS | Status: AC
  Administered 2022-12-19: 240 mg via INTRAVENOUS
  Filled 2022-12-19: qty 10

## 2022-12-19 MED ORDER — ATROPINE SULFATE 1 MG/ML IV SOLN
0.5000 mg | Freq: Once | INTRAVENOUS | Status: AC | PRN
  Administered 2022-12-19: 0.5 mg via INTRAVENOUS
  Filled 2022-12-19: qty 1

## 2022-12-19 MED ORDER — SODIUM CHLORIDE 0.9 % IV SOLN
150.0000 mg | Freq: Once | INTRAVENOUS | Status: AC
  Administered 2022-12-19: 150 mg via INTRAVENOUS
  Filled 2022-12-19: qty 150
  Filled 2022-12-19: qty 5

## 2022-12-19 MED ORDER — LEUCOVORIN CALCIUM INJECTION 350 MG
400.0000 mg/m2 | Freq: Once | INTRAVENOUS | Status: AC
  Administered 2022-12-19: 636 mg via INTRAVENOUS
  Filled 2022-12-19: qty 31.8

## 2022-12-19 MED ORDER — SODIUM CHLORIDE 0.9 % IV SOLN
10.0000 mg | Freq: Once | INTRAVENOUS | Status: AC
  Administered 2022-12-19: 10 mg via INTRAVENOUS
  Filled 2022-12-19: qty 10
  Filled 2022-12-19: qty 1

## 2022-12-19 MED ORDER — DEXTROSE 5 % IV SOLN: Freq: Once | INTRAVENOUS | Status: AC

## 2022-12-19 NOTE — Patient Instructions (Signed)
Irinotecan Injection What is this medication? IRINOTECAN (ir in oh TEE kan) treats some types of cancer. It works by slowing down the growth of cancer cells. This medicine may be used for other purposes; ask your health care provider or pharmacist if you have questions. COMMON BRAND NAME(S): Camptosar What should I tell my care team before I take this medication? They need to know if you have any of these conditions: Dehydration Diarrhea Infection, especially a viral infection, such as chickenpox, cold sores, herpes Liver disease Low blood cell levels (white cells, red cells, and platelets) Low levels of electrolytes, such as calcium, magnesium, or potassium in your blood Recent or ongoing radiation An unusual or allergic reaction to irinotecan, other medications, foods, dyes, or preservatives If you or your partner are pregnant or trying to get pregnant Breast-feeding How should I use this medication? This medication is injected into a vein. It is given by your care team in a hospital or clinic setting. Talk to your care team about the use of this medication in children. Special care may be needed. Overdosage: If you think you have taken too much of this medicine contact a poison control center or emergency room at once. NOTE: This medicine is only for you. Do not share this medicine with others. What if I miss a dose? Keep appointments for follow-up doses. It is important not to miss your dose. Call your care team if you are unable to keep an appointment. What may interact with this medication? Do not take this medication with any of the following: Cobicistat Itraconazole This medication may also interact with the following: Certain antibiotics, such as clarithromycin, rifampin, rifabutin Certain antivirals for HIV or AIDS Certain medications for fungal infections, such as ketoconazole, posaconazole, voriconazole Certain medications for seizures, such as carbamazepine,  phenobarbital, phenytoin Gemfibrozil Nefazodone St. John's wort This list may not describe all possible interactions. Give your health care provider a list of all the medicines, herbs, non-prescription drugs, or dietary supplements you use. Also tell them if you smoke, drink alcohol, or use illegal drugs. Some items may interact with your medicine. What should I watch for while using this medication? Your condition will be monitored carefully while you are receiving this medication. You may need blood work while taking this medication. This medication may make you feel generally unwell. This is not uncommon as chemotherapy can affect healthy cells as well as cancer cells. Report any side effects. Continue your course of treatment even though you feel ill unless your care team tells you to stop. This medication can cause serious side effects. To reduce the risk, your care team may give you other medications to take before receiving this one. Be sure to follow the directions from your care team. This medication may affect your coordination, reaction time, or judgement. Do not drive or operate machinery until you know how this medication affects you. Sit up or stand slowly to reduce the risk of dizzy or fainting spells. Drinking alcohol with this medication can increase the risk of these side effects. This medication may increase your risk of getting an infection. Call your care team for advice if you get a fever, chills, sore throat, or other symptoms of a cold or flu. Do not treat yourself. Try to avoid being around people who are sick. Avoid taking medications that contain aspirin, acetaminophen, ibuprofen, naproxen, or ketoprofen unless instructed by your care team. These medications may hide a fever. This medication may increase your risk to bruise or  bleed. Call your care team if you notice any unusual bleeding. Be careful brushing or flossing your teeth or using a toothpick because you may get an  infection or bleed more easily. If you have any dental work done, tell your dentist you are receiving this medication. Talk to your care team if you or your partner are pregnant or think either of you might be pregnant. This medication can cause serious birth defects if taken during pregnancy and for 6 months after the last dose. You will need a negative pregnancy test before starting this medication. Contraception is recommended while taking this medication and for 6 months after the last dose. Your care team can help you find the option that works for you. Do not father a child while taking this medication and for 3 months after the last dose. Use a condom for contraception during this time period. Do not breastfeed while taking this medication and for 7 days after the last dose. This medication may cause infertility. Talk to your care team if you are concerned about your fertility. What side effects may I notice from receiving this medication? Side effects that you should report to your care team as soon as possible: Allergic reactions--skin rash, itching, hives, swelling of the face, lips, tongue, or throat Dry cough, shortness of breath or trouble breathing Increased saliva or tears, increased sweating, stomach cramping, diarrhea, small pupils, unusual weakness or fatigue, slow heartbeat Infection--fever, chills, cough, sore throat, wounds that don't heal, pain or trouble when passing urine, general feeling of discomfort or being unwell Kidney injury--decrease in the amount of urine, swelling of the ankles, hands, or feet Low red blood cell level--unusual weakness or fatigue, dizziness, headache, trouble breathing Severe or prolonged diarrhea Unusual bruising or bleeding Side effects that usually do not require medical attention (report to your care team if they continue or are bothersome): Constipation Diarrhea Hair loss Loss of appetite Nausea Stomach pain This list may not describe all  possible side effects. Call your doctor for medical advice about side effects. You may report side effects to FDA at 1-800-FDA-1088. Where should I keep my medication? This medication is given in a hospital or clinic. It will not be stored at home. NOTE: This sheet is a summary. It may not cover all possible information. If you have questions about this medicine, talk to your doctor, pharmacist, or health care provider.  2023 Elsevier/Gold Standard (2022-01-11 00:00:00)   Leucovorin Injection What is this medication? LEUCOVORIN (loo koe VOR in) prevents side effects from certain medications, such as methotrexate. It works by increasing folate levels. This helps protect healthy cells in your body. It may also be used to treat anemia caused by low levels of folate. It can also be used with fluorouracil, a type of chemotherapy, to treat colorectal cancer. It works by increasing the effects of fluorouracil in the body. This medicine may be used for other purposes; ask your health care provider or pharmacist if you have questions. What should I tell my care team before I take this medication? They need to know if you have any of these conditions: Anemia from low levels of vitamin B12 in the blood An unusual or allergic reaction to leucovorin, folic acid, other medications, foods, dyes, or preservatives Pregnant or trying to get pregnant Breastfeeding How should I use this medication? This medication is injected into a vein or a muscle. It is given by your care team in a hospital or clinic setting. Talk to your care  team about the use of this medication in children. Special care may be needed. Overdosage: If you think you have taken too much of this medicine contact a poison control center or emergency room at once. NOTE: This medicine is only for you. Do not share this medicine with others. What if I miss a dose? Keep appointments for follow-up doses. It is important not to miss your dose. Call  your care team if you are unable to keep an appointment. What may interact with this medication? Capecitabine Fluorouracil Phenobarbital Phenytoin Primidone Trimethoprim;sulfamethoxazole This list may not describe all possible interactions. Give your health care provider a list of all the medicines, herbs, non-prescription drugs, or dietary supplements you use. Also tell them if you smoke, drink alcohol, or use illegal drugs. Some items may interact with your medicine. What should I watch for while using this medication? Your condition will be monitored carefully while you are receiving this medication. This medication may increase the side effects of 5-fluorouracil. Tell your care team if you have diarrhea or mouth sores that do not get better or that get worse. What side effects may I notice from receiving this medication? Side effects that you should report to your care team as soon as possible: Allergic reactions--skin rash, itching, hives, swelling of the face, lips, tongue, or throat This list may not describe all possible side effects. Call your doctor for medical advice about side effects. You may report side effects to FDA at 1-800-FDA-1088. Where should I keep my medication? This medication is given in a hospital or clinic. It will not be stored at home. NOTE: This sheet is a summary. It may not cover all possible information. If you have questions about this medicine, talk to your doctor, pharmacist, or health care provider.  2023 Else[employer]/Gold Standard (2022-01-12 00:00:00)  Fluorouracil Injection What is this medication? FLUOROURACIL (flure oh YOOR a sil) treats some types of cancer. It works by slowing down the growth of cancer cells. This medicine may be used for other purposes; ask your health care provider or pharmacist if you have questions. COMMON BRAND NAME(S): Adrucil What should I tell my care team before I take this medication? They need to know if you have any of  these conditions: Blood disorders Dihydropyrimidine dehydrogenase (DPD) deficiency Infection, such as chickenpox, cold sores, herpes Kidney disease Liver disease Poor nutrition Recent or ongoing radiation therapy An unusual or allergic reaction to fluorouracil, other medications, foods, dyes, or preservatives If you or your partner are pregnant or trying to get pregnant Breast-feeding How should I use this medication? This medication is injected into a vein. It is administered by your care team in a hospital or clinic setting. Talk to your care team about the use of this medication in children. Special care may be needed. Overdosage: If you think you have taken too much of this medicine contact a poison control center or emergency room at once. NOTE: This medicine is only for you. Do not share this medicine with others. What if I miss a dose? Keep appointments for follow-up doses. It is important not to miss your dose. Call your care team if you are unable to keep an appointment. What may interact with this medication? Do not take this medication with any of the following: Live virus vaccines This medication may also interact with the following: Medications that treat or prevent blood clots, such as warfarin, enoxaparin, dalteparin This list may not describe all possible interactions. Give your health care provider a  list of all the medicines, herbs, non-prescription drugs, or dietary supplements you use. Also tell them if you smoke, drink alcohol, or use illegal drugs. Some items may interact with your medicine. What should I watch for while using this medication? Your condition will be monitored carefully while you are receiving this medication. This medication may make you feel generally unwell. This is not uncommon as chemotherapy can affect healthy cells as well as cancer cells. Report any side effects. Continue your course of treatment even though you feel ill unless your care team  tells you to stop. In some cases, you may be given additional medications to help with side effects. Follow all directions for their use. This medication may increase your risk of getting an infection. Call your care team for advice if you get a fever, chills, sore throat, or other symptoms of a cold or flu. Do not treat yourself. Try to avoid being around people who are sick. This medication may increase your risk to bruise or bleed. Call your care team if you notice any unusual bleeding. Be careful brushing or flossing your teeth or using a toothpick because you may get an infection or bleed more easily. If you have any dental work done, tell your dentist you are receiving this medication. Avoid taking medications that contain aspirin, acetaminophen, ibuprofen, naproxen, or ketoprofen unless instructed by your care team. These medications may hide a fever. Do not treat diarrhea with over the counter products. Contact your care team if you have diarrhea that lasts more than 2 days or if it is severe and watery. This medication can make you more sensitive to the sun. Keep out of the sun. If you cannot avoid being in the sun, wear protective clothing and sunscreen. Do not use sun lamps, tanning beds, or tanning booths. Talk to your care team if you or your partner wish to become pregnant or think you might be pregnant. This medication can cause serious birth defects if taken during pregnancy and for 3 months after the last dose. A reliable form of contraception is recommended while taking this medication and for 3 months after the last dose. Talk to your care team about effective forms of contraception. Do not father a child while taking this medication and for 3 months after the last dose. Use a condom while having sex during this time period. Do not breastfeed while taking this medication. This medication may cause infertility. Talk to your care team if you are concerned about your fertility. What side  effects may I notice from receiving this medication? Side effects that you should report to your care team as soon as possible: Allergic reactions--skin rash, itching, hives, swelling of the face, lips, tongue, or throat Heart attack--pain or tightness in the chest, shoulders, arms, or jaw, nausea, shortness of breath, cold or clammy skin, feeling faint or lightheaded Heart failure--shortness of breath, swelling of the ankles, feet, or hands, sudden weight gain, unusual weakness or fatigue Heart rhythm changes--fast or irregular heartbeat, dizziness, feeling faint or lightheaded, chest pain, trouble breathing High ammonia level--unusual weakness or fatigue, confusion, loss of appetite, nausea, vomiting, seizures Infection--fever, chills, cough, sore throat, wounds that don't heal, pain or trouble when passing urine, general feeling of discomfort or being unwell Low red blood cell level--unusual weakness or fatigue, dizziness, headache, trouble breathing Pain, tingling, or numbness in the hands or feet, muscle weakness, change in vision, confusion or trouble speaking, loss of balance or coordination, trouble walking, seizures Redness, swelling, and  blistering of the skin over hands and feet Severe or prolonged diarrhea Unusual bruising or bleeding Side effects that usually do not require medical attention (report to your care team if they continue or are bothersome): Dry skin Headache Increased tears Nausea Pain, redness, or swelling with sores inside the mouth or throat Sensitivity to light Vomiting This list may not describe all possible side effects. Call your doctor for medical advice about side effects. You may report side effects to FDA at 1-800-FDA-1088. Where should I keep my medication? This medication is given in a hospital or clinic. It will not be stored at home. NOTE: This sheet is a summary. It may not cover all possible information. If you have questions about this medicine,  talk to your doctor, pharmacist, or health care provider.  2023 [employer]/Gold Standard (2022-01-02 00:00:00) Center  Discharge Instructions: Thank you for choosing [facility] to provide your oncology and hematology care.  If you have a lab appointment with the [facility], please go directly to the [facility] and check in at the registration area.   Wear comfortable clothing and clothing appropriate for easy access to any Portacath or PICC line.   We strive to give you quality time with your provider. You may need to reschedule your appointment if you arrive late (15 or more minutes).  Arriving late affects you and other patients whose appointments are after yours.  Also, if you miss three or more appointments without notifying the office, you may be dismissed from the clinic at the provider's discretion.      For prescription refill requests, have your pharmacy contact our office and allow 72 hours for refills to be completed.    Today you received the following chemotherapy and/or immunotherapy agents 5FLUOROURACIL - OXALIPLATIN - LEUCOVORIN - IRINOTECAN      To help prevent nausea and vomiting after your treatment, we encourage you to take your nausea medication as directed.  BELOW ARE SYMPTOMS THAT SHOULD BE REPORTED IMMEDIATELY: *FEVER GREATER THAN 100.4 F (38 C) OR HIGHER *CHILLS OR SWEATING *NAUSEA AND VOMITING THAT IS NOT CONTROLLED WITH YOUR NAUSEA MEDICATION *UNUSUAL SHORTNESS OF BREATH *UNUSUAL BRUISING OR BLEEDING *URINARY PROBLEMS (pain or burning when urinating, or frequent urination) *BOWEL PROBLEMS (unusual diarrhea, constipation, pain near the anus) TENDERNESS IN MOUTH AND THROAT WITH OR WITHOUT PRESENCE OF ULCERS (sore throat, sores in mouth, or a toothache) UNUSUAL RASH, SWELLING OR PAIN  UNUSUAL VAGINAL DISCHARGE OR ITCHING   Items with * indicate a potential emergency and should be followed up as soon as possible or  go to the Emergency Department if any problems should occur.  Please show the CHEMOTHERAPY ALERT CARD or IMMUNOTHERAPY ALERT CARD at check-in to the Emergency Department and triage nurse.  Should you have questions after your visit or need to cancel or reschedule your appointment, please contact [facility]  Dept: [phone]  and follow the prompts.  Office hours are 8:00 a.m. to 4:30 p.m. Monday - Friday. Please note that voicemails left after 4:00 p.m. may not be returned until the following business day.  We are closed weekends and major holidays. You have access to a nurse at all times for urgent questions. Please call the main number to the clinic Dept: [phone] and follow the prompts.  For any non-urgent questions, you may also contact your provider using MyChart. We now offer e-Visits for anyone 62 and older to request care online for  non-urgent symptoms. For details visit [email or web address].   Also download the MyChart app! Go to the app store, search "MyChart", open the app, select [facility], and log in with your MyChart username and password.

## 2022-12-19 NOTE — Progress Notes (Signed)
PT BECAME VERY SWEATY AT THE END OF THE IRINOTECAN INFUSION.  NO OTHER COMPLAINTS AT ALL.  GAVE ATROPINE 0.5 MG AS ORDERED.  SYMPTOMS RESOLVED, VSS.

## 2022-12-21 ENCOUNTER — Inpatient Hospital Stay: Payer: Medicaid Other

## 2022-12-21 ENCOUNTER — Encounter (HOSPITAL_COMMUNITY): Payer: Self-pay | Admitting: Hematology

## 2022-12-21 VITALS — BP 125/99 | HR 108 | Temp 98.0°F | Resp 12 | Ht 59.0 in | Wt 136.0 lb

## 2022-12-21 DIAGNOSIS — Z5111 Encounter for antineoplastic chemotherapy: Secondary | ICD-10-CM | POA: Diagnosis not present

## 2022-12-21 DIAGNOSIS — C786 Secondary malignant neoplasm of retroperitoneum and peritoneum: Secondary | ICD-10-CM

## 2022-12-21 MED ORDER — SODIUM CHLORIDE 0.9% FLUSH
10.0000 mL | INTRAVENOUS | Status: DC | PRN
  Administered 2022-12-21: 10 mL

## 2022-12-21 MED ORDER — HEPARIN SOD (PORK) LOCK FLUSH 100 UNIT/ML IV SOLN
500.0000 [IU] | Freq: Once | INTRAVENOUS | Status: AC | PRN
  Administered 2022-12-21: 500 [IU]

## 2022-12-21 NOTE — Patient Instructions (Signed)
The chemotherapy medication bag should finish at 46 hours, 96 hours, or 7 days. For example, if your pump is scheduled for 46 hours and it was put on at 4:00 p.m., it should finish at 2:00 p.m. the day it is scheduled to come off regardless of your appointment time.     Estimated time to finish at 1355.   If the display on your pump reads "Low Volume" and it is beeping, take the batteries out of the pump and come to the cancer center for it to be taken off.   If the pump alarms go off prior to the pump reading "Low Volume" then call [phone] and someone can assist you.  If the plunger comes out and the chemotherapy medication is leaking out, please use your home chemo spill kit to clean up the spill. Do NOT use paper towels or other household products.  If you have problems or questions regarding your pump, please call either [phone] (24 hours a day) or the cancer center Monday-Friday 8:00 a.m.- 4:30 p.m. at the clinic number and we will assist you. If you are unable to get assistance, then go to the nearest Emergency Department and ask the staff to contact the IV team for assistance.    [facility]  Discharge Instructions: Thank you for choosing [facility] to provide your oncology and hematology care.  If you have a lab appointment with the Cancer Center, please go directly to the Cancer Center and check in at the registration area.   Wear comfortable clothing and clothing appropriate for easy access to any Portacath or PICC line.   We strive to give you quality time with your provider. You may need to reschedule your appointment if you arrive late (15 or more minutes).  Arriving late affects you and other patients whose appointments are after yours.  Also, if you miss three or more appointments without notifying the office, you may be dismissed from the clinic at the provider's discretion.      For prescription refill requests, have your  pharmacy contact our office and allow 72 hours for refills to be completed.    Today you received the following chemotherapy and/or immunotherapy agents 68fluorouracil      To help prevent nausea and vomiting after your treatment, we encourage you to take your nausea medication as directed.  BELOW ARE SYMPTOMS THAT SHOULD BE REPORTED IMMEDIATELY: *FEVER GREATER THAN 100.4 F (38 C) OR HIGHER *CHILLS OR SWEATING *NAUSEA AND VOMITING THAT IS NOT CONTROLLED WITH YOUR NAUSEA MEDICATION *UNUSUAL SHORTNESS OF BREATH *UNUSUAL BRUISING OR BLEEDING *URINARY PROBLEMS (pain or burning when urinating, or frequent urination) *BOWEL PROBLEMS (unusual diarrhea, constipation, pain near the anus) TENDERNESS IN MOUTH AND THROAT WITH OR WITHOUT PRESENCE OF ULCERS (sore throat, sores in mouth, or a toothache) UNUSUAL RASH, SWELLING OR PAIN  UNUSUAL VAGINAL DISCHARGE OR ITCHING   Items with * indicate a potential emergency and should be followed up as soon as possible or go to the Emergency Department if any problems should occur.  Please show the CHEMOTHERAPY ALERT CARD or IMMUNOTHERAPY ALERT CARD at check-in to the Emergency Department and triage nurse.  Should you have questions after your visit or need to cancel or reschedule your appointment, please contact [facility]  Dept: [phone]  and follow the prompts.  Office hours are 8:00 a.m. to 4:30 p.m. Monday - Friday. Please note that voicemails left after 4:00 p.m. may not  be returned until the following business day.  We are closed weekends and major holidays. You have access to a nurse at all times for urgent questions. Please call the main number to the clinic Dept: [phone] and follow the prompts.  For any non-urgent questions, you may also contact your provider using MyChart. We now offer e-Visits for anyone 4518 and older to request care online for non-urgent symptoms. For details visit [email or web address].   Also  download the MyChart app! Go to the app store, search "MyChart", open the app, select [place], and log in with your MyChart username and password.  Fluorouracil Injection What is this medication? FLUOROURACIL (flure oh YOOR a sil) treats some types of cancer. It works by slowing down the growth of cancer cells. This medicine may be used for other purposes; ask your health care provider or pharmacist if you have questions. COMMON BRAND NAME(S): Adrucil What should I tell my care team before I take this medication? They need to know if you have any of these conditions: Blood disorders Dihydropyrimidine dehydrogenase (DPD) deficiency Infection, such as chickenpox, cold sores, herpes Kidney disease Liver disease Poor nutrition Recent or ongoing radiation therapy An unusual or allergic reaction to fluorouracil, other medications, foods, dyes, or preservatives If you or your partner are pregnant or trying to get pregnant Breast-feeding How should I use this medication? This medication is injected into a vein. It is administered by your care team in a hospital or clinic setting. Talk to your care team about the use of this medication in children. Special care may be needed. Overdosage: If you think you have taken too much of this medicine contact a poison control center or emergency room at once. NOTE: This medicine is only for you. Do not share this medicine with others. What if I miss a dose? Keep appointments for follow-up doses. It is important not to miss your dose. Call your care team if you are unable to keep an appointment. What may interact with this medication? Do not take this medication with any of the following: Live virus vaccines This medication may also interact with the following: Medications that treat or prevent blood clots, such as warfarin, enoxaparin, dalteparin This list may not describe all possible interactions. Give your health care provider a list of all the  medicines, herbs, non-prescription drugs, or dietary supplements you use. Also tell them if you smoke, drink alcohol, or use illegal drugs. Some items may interact with your medicine. What should I watch for while using this medication? Your condition will be monitored carefully while you are receiving this medication. This medication may make you feel generally unwell. This is not uncommon as chemotherapy can affect healthy cells as well as cancer cells. Report any side effects. Continue your course of treatment even though you feel ill unless your care team tells you to stop. In some cases, you may be given additional medications to help with side effects. Follow all directions for their use. This medication may increase your risk of getting an infection. Call your care team for advice if you get a fever, chills, sore throat, or other symptoms of a cold or flu. Do not treat yourself. Try to avoid being around people who are sick. This medication may increase your risk to bruise or bleed. Call your care team if you notice any unusual bleeding. Be careful brushing or flossing your teeth or using a toothpick because you may get an infection or bleed more easily. If you have  any dental work done, tell your dentist you are receiving this medication. Avoid taking medications that contain aspirin, acetaminophen, ibuprofen, naproxen, or ketoprofen unless instructed by your care team. These medications may hide a fever. Do not treat diarrhea with over the counter products. Contact your care team if you have diarrhea that lasts more than 2 days or if it is severe and watery. This medication can make you more sensitive to the sun. Keep out of the sun. If you cannot avoid being in the sun, wear protective clothing and sunscreen. Do not use sun lamps, tanning beds, or tanning booths. Talk to your care team if you or your partner wish to become pregnant or think you might be pregnant. This medication can cause  serious birth defects if taken during pregnancy and for 3 months after the last dose. A reliable form of contraception is recommended while taking this medication and for 3 months after the last dose. Talk to your care team about effective forms of contraception. Do not father a child while taking this medication and for 3 months after the last dose. Use a condom while having sex during this time period. Do not breastfeed while taking this medication. This medication may cause infertility. Talk to your care team if you are concerned about your fertility. What side effects may I notice from receiving this medication? Side effects that you should report to your care team as soon as possible: Allergic reactions--skin rash, itching, hives, swelling of the face, lips, tongue, or throat Heart attack--pain or tightness in the chest, shoulders, arms, or jaw, nausea, shortness of breath, cold or clammy skin, feeling faint or lightheaded Heart failure--shortness of breath, swelling of the ankles, feet, or hands, sudden weight gain, unusual weakness or fatigue Heart rhythm changes--fast or irregular heartbeat, dizziness, feeling faint or lightheaded, chest pain, trouble breathing High ammonia level--unusual weakness or fatigue, confusion, loss of appetite, nausea, vomiting, seizures Infection--fever, chills, cough, sore throat, wounds that don't heal, pain or trouble when passing urine, general feeling of discomfort or being unwell Low red blood cell level--unusual weakness or fatigue, dizziness, headache, trouble breathing Pain, tingling, or numbness in the hands or feet, muscle weakness, change in vision, confusion or trouble speaking, loss of balance or coordination, trouble walking, seizures Redness, swelling, and blistering of the skin over hands and feet Severe or prolonged diarrhea Unusual bruising or bleeding Side effects that usually do not require medical attention (report to your care team if they  continue or are bothersome): Dry skin Headache Increased tears Nausea Pain, redness, or swelling with sores inside the mouth or throat Sensitivity to light Vomiting This list may not describe all possible side effects. Call your doctor for medical advice about side effects. You may report side effects to FDA at 1-800-FDA-1088. Where should I keep my medication? This medication is given in a hospital or clinic. It will not be stored at home. NOTE: This sheet is a summary. It may not cover all possible information. If you have questions about this medicine, talk to your doctor, pharmacist, or health care provider.  2023 Elsevier/Gold Standard (2022-01-02 00:00:00)

## 2022-12-24 ENCOUNTER — Inpatient Hospital Stay: Payer: Medicaid Other

## 2022-12-24 ENCOUNTER — Telehealth: Payer: Self-pay | Admitting: Oncology

## 2022-12-24 VITALS — BP 128/94 | HR 101 | Temp 98.5°F | Resp 14 | Ht 59.0 in | Wt 127.0 lb

## 2022-12-24 DIAGNOSIS — Z5111 Encounter for antineoplastic chemotherapy: Secondary | ICD-10-CM | POA: Diagnosis not present

## 2022-12-24 DIAGNOSIS — C786 Secondary malignant neoplasm of retroperitoneum and peritoneum: Secondary | ICD-10-CM

## 2022-12-24 MED ORDER — FILGRASTIM-SNDZ 480 MCG/0.8ML IJ SOSY
480.0000 ug | PREFILLED_SYRINGE | Freq: Once | INTRAMUSCULAR | Status: AC
  Administered 2022-12-24: 480 ug via SUBCUTANEOUS
  Filled 2022-12-24: qty 0.8

## 2022-12-24 NOTE — Telephone Encounter (Signed)
12/24/22 LVM about CT SCAN on 12/28/22 arrive at 9am

## 2022-12-25 ENCOUNTER — Inpatient Hospital Stay: Payer: Medicaid Other

## 2022-12-25 VITALS — BP 125/80 | HR 93 | Temp 98.2°F | Resp 18 | Ht 59.0 in | Wt 126.1 lb

## 2022-12-25 DIAGNOSIS — Z5111 Encounter for antineoplastic chemotherapy: Secondary | ICD-10-CM | POA: Diagnosis not present

## 2022-12-25 DIAGNOSIS — C786 Secondary malignant neoplasm of retroperitoneum and peritoneum: Secondary | ICD-10-CM

## 2022-12-25 MED ORDER — FILGRASTIM-SNDZ 480 MCG/0.8ML IJ SOSY
480.0000 ug | PREFILLED_SYRINGE | Freq: Once | INTRAMUSCULAR | Status: AC
  Administered 2022-12-25: 480 ug via SUBCUTANEOUS
  Filled 2022-12-25: qty 0.8

## 2022-12-25 NOTE — Patient Instructions (Signed)
Filgrastim Injection What is this medication? FILGRASTIM (fil GRA stim) lowers the risk of infection in people who are receiving chemotherapy. It works by helping your body make more white blood cells, which protects your body from infection. It may also be used to help people who have been exposed to high doses of radiation. It can be used to help prepare your body before a stem cell transplant. It works by helping your bone marrow make and release stem cells into the blood. This medicine may be used for other purposes; ask your health care provider or pharmacist if you have questions. COMMON BRAND NAME(S): Neupogen, Nivestym, Releuko, Zarxio What should I tell my care team before I take this medication? They need to know if you have any of these conditions: History of blood diseases, such as sickle cell anemia Kidney disease Recent or ongoing radiation An unusual or allergic reaction to filgrastim, pegfilgrastim, latex, rubber, other medications, foods, dyes, or preservatives Pregnant or trying to get pregnant Breast-feeding How should I use this medication? This medication is injected under the skin or into a vein. It is usually given by your care team in a hospital or clinic setting. It may be given at home. If you get this medication at home, you will be taught how to prepare and give it. Use exactly as directed. Take it as directed on the prescription label at the same time every day. Keep taking it unless your care team tells you to stop. It is important that you put your used needles and syringes in a special sharps container. Do not put them in a trash can. If you do not have a sharps container, call your pharmacist or care team to get one. This medication comes with INSTRUCTIONS FOR USE. Ask your pharmacist for directions on how to use this medication. Read the information carefully. Talk to your pharmacist or care team if you have questions. Talk to your care team about the use of this  medication in children. While it may be prescribed for children for selected conditions, precautions do apply. Overdosage: If you think you have taken too much of this medicine contact a poison control center or emergency room at once. NOTE: This medicine is only for you. Do not share this medicine with others. What if I miss a dose? It is important not to miss any doses. Talk to your care team about what to do if you miss a dose. What may interact with this medication? Medications that may cause a release of neutrophils, such as lithium This list may not describe all possible interactions. Give your health care provider a list of all the medicines, herbs, non-prescription drugs, or dietary supplements you use. Also tell them if you smoke, drink alcohol, or use illegal drugs. Some items may interact with your medicine. What should I watch for while using this medication? Your condition will be monitored carefully while you are receiving this medication. You may need bloodwork while taking this medication. Talk to your care team about your risk of cancer. You may be more at risk for certain types of cancer if you take this medication. What side effects may I notice from receiving this medication? Side effects that you should report to your care team as soon as possible: Allergic reactions--skin rash, itching, hives, swelling of the face, lips, tongue, or throat Capillary leak syndrome--stomach or muscle pain, unusual weakness or fatigue, feeling faint or lightheaded, decrease in the amount of urine, swelling of the ankles, hands, or   feet, trouble breathing High white blood cell level--fever, fatigue, trouble breathing, night sweats, change in vision, weight loss Inflammation of the aorta--fever, fatigue, back, chest, or stomach pain, severe headache Kidney injury (glomerulonephritis)--decrease in the amount of urine, red or dark brown urine, foamy or bubbly urine, swelling of the ankles, hands, or  feet Shortness of breath or trouble breathing Spleen injury--pain in upper left stomach or shoulder Unusual bruising or bleeding Side effects that usually do not require medical attention (report to your care team if they continue or are bothersome): Back pain Bone pain Fatigue Fever Headache Nausea This list may not describe all possible side effects. Call your doctor for medical advice about side effects. You may report side effects to FDA at 1-800-FDA-1088. Where should I keep my medication? Keep out of the reach of children and pets. Keep this medication in the original packaging until you are ready to take it. Protect from light. See product for storage information. Each product may have different instructions. Get rid of any unused medication after the expiration date. To get rid of medications that are no longer needed or have expired: Take the medication to a medications take-back program. Check with your pharmacy or law enforcement to find a location. If you cannot return the medication, ask your pharmacist or care team how to get rid of this medication safely. NOTE: This sheet is a summary. It may not cover all possible information. If you have questions about this medicine, talk to your doctor, pharmacist, or health care provider.  2023 Elsevier/Gold Standard (2021-12-12 00:00:00)  

## 2022-12-27 ENCOUNTER — Telehealth: Payer: Self-pay

## 2022-12-31 ENCOUNTER — Inpatient Hospital Stay (INDEPENDENT_AMBULATORY_CARE_PROVIDER_SITE_OTHER): Payer: Medicaid Other | Admitting: Oncology

## 2022-12-31 ENCOUNTER — Other Ambulatory Visit: Payer: Medicaid Other

## 2022-12-31 ENCOUNTER — Inpatient Hospital Stay: Payer: Medicaid Other | Admitting: Oncology

## 2022-12-31 DIAGNOSIS — C786 Secondary malignant neoplasm of retroperitoneum and peritoneum: Secondary | ICD-10-CM

## 2022-12-31 LAB — BASIC METABOLIC PANEL
BUN: 10 (ref 4–21)
CO2: 26 — AB (ref 13–22)
Chloride: 96 — AB (ref 99–108)
Creatinine: 0.4 — AB (ref 0.5–1.1)
EGFR: 60
Glucose: 147
Potassium: 3.3 mEq/L — AB (ref 3.5–5.1)
Sodium: 132 — AB (ref 137–147)

## 2022-12-31 LAB — HEPATIC FUNCTION PANEL
ALT: 22 U/L (ref 7–35)
AST: 19 (ref 13–35)
Alkaline Phosphatase: 142 — AB (ref 25–125)

## 2022-12-31 LAB — CBC AND DIFFERENTIAL
HCT: 32 — AB (ref 36–46)
Hemoglobin: 9.8 — AB (ref 12.0–16.0)
Neutrophils Absolute: 2.65
Platelets: 355 10*3/uL (ref 150–400)
WBC: 5

## 2022-12-31 LAB — CBC: RBC: 4.06 (ref 3.87–5.11)

## 2022-12-31 LAB — COMPREHENSIVE METABOLIC PANEL
Albumin: 3.1 — AB (ref 3.5–5.0)
Calcium: 8.7 (ref 8.7–10.7)

## 2022-12-31 NOTE — Progress Notes (Signed)
[facility]  [address] [place],  [place]  [address] [phone]  Clinic Day:  12/31/2022  Referring physician: Barbie Banner, MD  HISTORY OF PRESENT ILLNESS:  The patient is a 47 y.o. [name]  with metastatic pancreatic cancer, with spread of disease to her peritoneum, uterus and ovaries.  She comes in today to go over her CT scans ascertain her new disease baseline after receiving 4 cycles of FOLFIRINOX.  The patient claims to have tolerated her 4th cycle of FOLFIRINOX fairly well.  She has issues with cold intolerance, which is a side effect related to the oxaliplatin component of her chemotherapy. She is on Eliquis due to a small right-sided pulmonary embolus seen on February 2024 CT scans.  She denies having abdominal distention, pain, or other symptoms which concern her for overt signs of disease progression.  Her history dates back to January 2024 when she first began having lower quadrant abdominal pain.  Over a span of weeks, she began having abdominal swelling and distention.  She also began having increased constipation.  This constellation of symptoms led to her undergoing CT scans of her abdomen/pelvis, which reveal a 7.9 cm cystic pancreatic lesion, with an irregular stomach border.  There was extensive ascites, peritoneal carcinomatosis, bilateral adnexal masses and uterine involvement.  The patient underwent a paracentesis in early February 2024, which revealed malignant cells, consistent with adenocarcinoma.  She also underwent an endometrial biopsy, which revealed metastatic adenocarcinoma.  In particular, immunohistochemical stains were positive for CK7/CDX2/CK20.  PAX8 and ER staining was negative. This pattern was consistent with a malignancy from an upper GI/pancreaticobiliary origin.  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   PHYSICAL EXAM:  Blood pressure 135/85, pulse (!) 115, temperature 97.6 F (36.4  C), resp. rate 16, height  (1.499 m), weight 127 lb 1.6 oz (57.7 kg), SpO2 96 %, unknown if currently breastfeeding. Wt Readings from Last 3 Encounters:  12/31/22 127 lb 1.6 oz (57.7 kg)  12/25/22 126 lb 1.3 oz (57.2 kg)  12/24/22 127 lb 0.6 oz (57.6 kg)   Body mass index is 25.67 kg/m. Performance status (ECOG): 2 - Symptomatic, <50% confined to bed Physical Exam Constitutional:      Appearance: Normal appearance.     Comments: She appears stronger and healthier vs previous visits  HENT:     Mouth/Throat:     Mouth: Mucous membranes are moist.     Pharynx: Oropharynx is clear. No oropharyngeal exudate or posterior oropharyngeal erythema.  Cardiovascular:     Rate and Rhythm: Normal rate and regular rhythm.     Heart sounds: No murmur heard.    No friction rub. No gallop.  Pulmonary:     Effort: Pulmonary effort is normal. No respiratory distress.     Breath sounds: Normal breath sounds. No wheezing, rhonchi or rales.  Abdominal:     General: Bowel sounds are normal. There is no distension.     Palpations: Abdomen is soft. There is no mass.     Tenderness: There is no abdominal tenderness.  Musculoskeletal:        General: No swelling.     Right lower leg: No edema.     Left lower leg: No edema.  Lymphadenopathy:     Cervical: No cervical adenopathy.     Upper Body:     Right upper body: No supraclavicular or axillary adenopathy.     Left upper body: No supraclavicular or axillary adenopathy.  Lower Body: No right inguinal adenopathy. No left inguinal adenopathy.  Skin:    General: Skin is warm.     Coloration: Skin is not jaundiced.     Findings: No lesion or rash.  Neurological:     General: No focal deficit present.     Mental Status: She is alert and oriented to person, place, and time. Mental status is at baseline.  Psychiatric:        Mood and Affect: Mood normal.        Behavior: Behavior normal.        Thought Content: Thought content normal.  SCANS:  CT scans of her chest/abdomen/pelvis revealed the following:  LABS:      Latest Ref Rng & Units 12/17/2022   12:00 AM 11/17/2022    6:15 AM 11/14/2022    8:16 AM  CBC  WBC  2.3     7.7  18.2   Hemoglobin 12.0 - 16.0 8.9     9.4  8.3   Hematocrit 36 - 46 29     30.7  26.9   Platelets 150 - 400 K/uL 289     330  346      This result is from an external source.      Latest Ref Rng & Units 12/18/2022    9:Christina AM 11/17/2022    6:15 AM 11/15/2022    6:40 AM  CMP  Glucose 70 - 99 mg/dL 294  765  465   BUN 6 - 20 mg/dL 12  24  16    Creatinine 0.44 - 1.00 mg/dL 0.35  4.65  6.81   Sodium 135 - 145 mmol/L 134  130  130   Potassium 3.5 - 5.1 mmol/L 3.6  3.9  4.1   Chloride 98 - 111 mmol/L 100  93  97   CO2 22 - 32 mmol/L 24  27  26    Calcium 8.9 - 10.3 mg/dL 8.5  7.8  8.0   Total Protein 6.5 - 8.1 g/dL 5.9  5.1  5.2   Total Bilirubin 0.3 - 1.2 mg/dL 0.2  0.2  0.4   Alkaline Phos Christina - 126 U/L 95  91  98   AST 15 - 41 U/L 13  16  18    ALT 0 - 44 U/L 21  40  44    ASSESSMENT & PLAN:  A 47 y.o. [name] with metastatic pancreatic cancer.  In clinic today, went over all of her CT scan images with her, for which she did see the significant disease improvement in her abdominal/pelvic regions.  Her metastatic ovarian lesions are clearly improved in size, as has a primary pancreatic lesion.  The only new finding that is different than previously is her left pleural effusion.  I do believe this fluid has likely extravasated from her abdomen.  Specifically has no significant ascites at this time.  Overall, as her scan and she clinically is doing better, she will proceed with her fifth cycle of FOLFIRINOX this week.  I will see her back in 2 weeks before she has into her 6 cycle of FOLFIRINOX. The patient understands all the plans discussed today and is in agreement with them.    Tamas Suen Kirby Funk, MD

## 2023-01-01 ENCOUNTER — Other Ambulatory Visit: Payer: Self-pay

## 2023-01-01 MED ORDER — POTASSIUM CHLORIDE CRYS ER 20 MEQ PO TBCR
20.0000 meq | EXTENDED_RELEASE_TABLET | Freq: Every day | ORAL | 0 refills | Status: DC
Start: 2023-01-01 — End: 2023-02-08

## 2023-01-01 MED FILL — Dexamethasone Sodium Phosphate Inj 100 MG/10ML: INTRAMUSCULAR | Qty: 1 | Status: AC

## 2023-01-01 MED FILL — Irinotecan HCl Inj 100 MG/5ML (20 MG/ML): INTRAVENOUS | Qty: 12 | Status: AC

## 2023-01-01 MED FILL — Oxaliplatin IV Soln 100 MG/20ML: INTRAVENOUS | Qty: 27 | Status: AC

## 2023-01-01 MED FILL — Fosaprepitant Dimeglumine For IV Infusion 150 MG (Base Eq): INTRAVENOUS | Qty: 5 | Status: AC

## 2023-01-01 MED FILL — Fluorouracil IV Soln 5 GM/100ML (50 MG/ML): INTRAVENOUS | Qty: 77 | Status: AC

## 2023-01-02 ENCOUNTER — Encounter: Payer: Self-pay | Admitting: Oncology

## 2023-01-02 ENCOUNTER — Inpatient Hospital Stay: Payer: Medicaid Other

## 2023-01-02 DIAGNOSIS — Z5111 Encounter for antineoplastic chemotherapy: Secondary | ICD-10-CM | POA: Diagnosis not present

## 2023-01-02 DIAGNOSIS — C786 Secondary malignant neoplasm of retroperitoneum and peritoneum: Secondary | ICD-10-CM

## 2023-01-02 MED ORDER — IRINOTECAN HCL CHEMO INJECTION 100 MG/5ML
150.0000 mg/m2 | Freq: Once | INTRAVENOUS | Status: AC
  Administered 2023-01-02: 240 mg via INTRAVENOUS
  Filled 2023-01-02: qty 10

## 2023-01-02 MED ORDER — HEPARIN SOD (PORK) LOCK FLUSH 100 UNIT/ML IV SOLN: 500.0000 [IU] | Freq: Once | INTRAVENOUS | Status: DC | PRN

## 2023-01-02 MED ORDER — SODIUM CHLORIDE 0.9 % IV SOLN
150.0000 mg | Freq: Once | INTRAVENOUS | Status: AC
  Administered 2023-01-02: 150 mg via INTRAVENOUS
  Filled 2023-01-02: qty 150

## 2023-01-02 MED ORDER — SODIUM CHLORIDE 0.9 % IV SOLN
2425.0000 mg/m2 | INTRAVENOUS | Status: DC
  Administered 2023-01-02: 3850 mg via INTRAVENOUS
  Filled 2023-01-02: qty 77

## 2023-01-02 MED ORDER — ATROPINE SULFATE 1 MG/ML IV SOLN
0.5000 mg | Freq: Once | INTRAVENOUS | Status: AC | PRN
  Administered 2023-01-02: 0.5 mg via INTRAVENOUS
  Filled 2023-01-02: qty 1

## 2023-01-02 MED ORDER — LEUCOVORIN CALCIUM INJECTION 350 MG
400.0000 mg/m2 | Freq: Once | INTRAVENOUS | Status: AC
  Administered 2023-01-02: 636 mg via INTRAVENOUS
  Filled 2023-01-02: qty 31.8

## 2023-01-02 MED ORDER — PALONOSETRON HCL INJECTION 0.25 MG/5ML
0.2500 mg | Freq: Once | INTRAVENOUS | Status: AC
  Administered 2023-01-02: 0.25 mg via INTRAVENOUS
  Filled 2023-01-02: qty 5

## 2023-01-02 MED ORDER — DEXTROSE 5 % IV SOLN: Freq: Once | INTRAVENOUS | Status: AC

## 2023-01-02 MED ORDER — SODIUM CHLORIDE 0.9 % IV SOLN
10.0000 mg | Freq: Once | INTRAVENOUS | Status: AC
  Administered 2023-01-02: 10 mg via INTRAVENOUS
  Filled 2023-01-02: qty 10

## 2023-01-02 MED ORDER — SODIUM CHLORIDE 0.9% FLUSH: 10.0000 mL | INTRAVENOUS | Status: DC | PRN

## 2023-01-02 MED ORDER — OXALIPLATIN CHEMO INJECTION 100 MG/20ML
84.0000 mg/m2 | Freq: Once | INTRAVENOUS | Status: AC
  Administered 2023-01-02: 135 mg via INTRAVENOUS
  Filled 2023-01-02: qty 20

## 2023-01-02 NOTE — Patient Instructions (Signed)
Oxaliplatin Injection What is this medication? OXALIPLATIN (ox AL i PLA tin) treats colorectal cancer. It works by slowing down the growth of cancer cells. This medicine may be used for other purposes; ask your health care provider or pharmacist if you have questions. COMMON BRAND NAME(S): Eloxatin What should I tell my care team before I take this medication? They need to know if you have any of these conditions: Heart disease History of irregular heartbeat or rhythm Liver disease Low blood cell levels (white cells, red cells, and platelets) Lung or breathing disease, such as asthma Take medications that treat or prevent blood clots Tingling of the fingers, toes, or other nerve disorder An unusual or allergic reaction to oxaliplatin, other medications, foods, dyes, or preservatives If you or your partner are pregnant or trying to get pregnant Breast-feeding How should I use this medication? This medication is injected into a vein. It is given by your care team in a hospital or clinic setting. Talk to your care team about the use of this medication in children. Special care may be needed. Overdosage: If you think you have taken too much of this medicine contact a poison control center or emergency room at once. NOTE: This medicine is only for you. Do not share this medicine with others. What if I miss a dose? Keep appointments for follow-up doses. It is important not to miss a dose. Call your care team if you are unable to keep an appointment. What may interact with this medication? Do not take this medication with any of the following: Cisapride Dronedarone Pimozide Thioridazine This medication may also interact with the following: Aspirin and aspirin-like medications Certain medications that treat or prevent blood clots, such as warfarin, apixaban, dabigatran, and rivaroxaban Cisplatin Cyclosporine Diuretics Medications for infection, such as acyclovir, adefovir, amphotericin B,  bacitracin, cidofovir, foscarnet, ganciclovir, gentamicin, pentamidine, vancomycin NSAIDs, medications for pain and inflammation, such as ibuprofen or naproxen Other medications that cause heart rhythm changes Pamidronate Zoledronic acid This list may not describe all possible interactions. Give your health care provider a list of all the medicines, herbs, non-prescription drugs, or dietary supplements you use. Also tell them if you smoke, drink alcohol, or use illegal drugs. Some items may interact with your medicine. What should I watch for while using this medication? Your condition will be monitored carefully while you are receiving this medication. You may need blood work while taking this medication. This medication may make you feel generally unwell. This is not uncommon as chemotherapy can affect healthy cells as well as cancer cells. Report any side effects. Continue your course of treatment even though you feel ill unless your care team tells you to stop. This medication may increase your risk of getting an infection. Call your care team for advice if you get a fever, chills, sore throat, or other symptoms of a cold or flu. Do not treat yourself. Try to avoid being around people who are sick. Avoid taking medications that contain aspirin, acetaminophen, ibuprofen, naproxen, or ketoprofen unless instructed by your care team. These medications may hide a fever. Be careful brushing or flossing your teeth or using a toothpick because you may get an infection or bleed more easily. If you have any dental work done, tell your dentist you are receiving this medication. This medication can make you more sensitive to cold. Do not drink cold drinks or use ice. Cover exposed skin before coming in contact with cold temperatures or cold objects. When out in cold weather  wear warm clothing and cover your mouth and nose to warm the air that goes into your lungs. Tell your care team if you get sensitive to the  cold. Talk to your care team if you or your partner are pregnant or think either of you might be pregnant. This medication can cause serious birth defects if taken during pregnancy and for 9 months after the last dose. A negative pregnancy test is required before starting this medication. A reliable form of contraception is recommended while taking this medication and for 9 months after the last dose. Talk to your care team about effective forms of contraception. Do not father a child while taking this medication and for 6 months after the last dose. Use a condom while having sex during this time period. Do not breastfeed while taking this medication and for 3 months after the last dose. This medication may cause infertility. Talk to your care team if you are concerned about your fertility. What side effects may I notice from receiving this medication? Side effects that you should report to your care team as soon as possible: Allergic reactions--skin rash, itching, hives, swelling of the face, lips, tongue, or throat Bleeding--bloody or black, tar-like stools, vomiting blood or brown material that looks like coffee grounds, red or dark brown urine, small red or purple spots on skin, unusual bruising or bleeding Dry cough, shortness of breath or trouble breathing Heart rhythm changes--fast or irregular heartbeat, dizziness, feeling faint or lightheaded, chest pain, trouble breathing Infection--fever, chills, cough, sore throat, wounds that don't heal, pain or trouble when passing urine, general feeling of discomfort or being unwell Liver injury--right upper belly pain, loss of appetite, nausea, light-colored stool, dark yellow or brown urine, yellowing skin or eyes, unusual weakness or fatigue Low red blood cell level--unusual weakness or fatigue, dizziness, headache, trouble breathing Muscle injury--unusual weakness or fatigue, muscle pain, dark yellow or brown urine, decrease in amount of urine Pain,  tingling, or numbness in the hands or feet Sudden and severe headache, confusion, change in vision, seizures, which may be signs of posterior reversible encephalopathy syndrome (PRES) Unusual bruising or bleeding Side effects that usually do not require medical attention (report to your care team if they continue or are bothersome): Diarrhea Nausea Pain, redness, or swelling with sores inside the mouth or throat Unusual weakness or fatigue Vomiting This list may not describe all possible side effects. Call your doctor for medical advice about side effects. You may report side effects to FDA at 1-800-FDA-1088. Where should I keep my medication? This medication is given in a hospital or clinic. It will not be stored at home. NOTE: This sheet is a summary. It may not cover all possible information. If you have questions about this medicine, talk to your doctor, pharmacist, or health care provider.  2023 Elsevier/Gold Standard (2007-10-25 00:00:00) Irinotecan Injection What is this medication? IRINOTECAN (ir in oh TEE kan) treats some types of cancer. It works by slowing down the growth of cancer cells. This medicine may be used for other purposes; ask your health care provider or pharmacist if you have questions. COMMON BRAND NAME(S): Camptosar What should I tell my care team before I take this medication? They need to know if you have any of these conditions: Dehydration Diarrhea Infection, especially a viral infection, such as chickenpox, cold sores, herpes Liver disease Low blood cell levels (white cells, red cells, and platelets) Low levels of electrolytes, such as calcium, magnesium, or potassium in your blood Recent  or ongoing radiation An unusual or allergic reaction to irinotecan, other medications, foods, dyes, or preservatives If you or your partner are pregnant or trying to get pregnant Breast-feeding How should I use this medication? This medication is injected into a vein.  It is given by your care team in a hospital or clinic setting. Talk to your care team about the use of this medication in children. Special care may be needed. Overdosage: If you think you have taken too much of this medicine contact a poison control center or emergency room at once. NOTE: This medicine is only for you. Do not share this medicine with others. What if I miss a dose? Keep appointments for follow-up doses. It is important not to miss your dose. Call your care team if you are unable to keep an appointment. What may interact with this medication? Do not take this medication with any of the following: Cobicistat Itraconazole This medication may also interact with the following: Certain antibiotics, such as clarithromycin, rifampin, rifabutin Certain antivirals for HIV or AIDS Certain medications for fungal infections, such as ketoconazole, posaconazole, voriconazole Certain medications for seizures, such as carbamazepine, phenobarbital, phenytoin Gemfibrozil Nefazodone St. John's wort This list may not describe all possible interactions. Give your health care provider a list of all the medicines, herbs, non-prescription drugs, or dietary supplements you use. Also tell them if you smoke, drink alcohol, or use illegal drugs. Some items may interact with your medicine. What should I watch for while using this medication? Your condition will be monitored carefully while you are receiving this medication. You may need blood work while taking this medication. This medication may make you feel generally unwell. This is not uncommon as chemotherapy can affect healthy cells as well as cancer cells. Report any side effects. Continue your course of treatment even though you feel ill unless your care team tells you to stop. This medication can cause serious side effects. To reduce the risk, your care team may give you other medications to take before receiving this one. Be sure to follow the  directions from your care team. This medication may affect your coordination, reaction time, or judgement. Do not drive or operate machinery until you know how this medication affects you. Sit up or stand slowly to reduce the risk of dizzy or fainting spells. Drinking alcohol with this medication can increase the risk of these side effects. This medication may increase your risk of getting an infection. Call your care team for advice if you get a fever, chills, sore throat, or other symptoms of a cold or flu. Do not treat yourself. Try to avoid being around people who are sick. Avoid taking medications that contain aspirin, acetaminophen, ibuprofen, naproxen, or ketoprofen unless instructed by your care team. These medications may hide a fever. This medication may increase your risk to bruise or bleed. Call your care team if you notice any unusual bleeding. Be careful brushing or flossing your teeth or using a toothpick because you may get an infection or bleed more easily. If you have any dental work done, tell your dentist you are receiving this medication. Talk to your care team if you or your partner are pregnant or think either of you might be pregnant. This medication can cause serious birth defects if taken during pregnancy and for 6 months after the last dose. You will need a negative pregnancy test before starting this medication. Contraception is recommended while taking this medication and for 6 months after the last  dose. Your care team can help you find the option that works for you. Do not father a child while taking this medication and for 3 months after the last dose. Use a condom for contraception during this time period. Do not breastfeed while taking this medication and for 7 days after the last dose. This medication may cause infertility. Talk to your care team if you are concerned about your fertility. What side effects may I notice from receiving this medication? Side effects that  you should report to your care team as soon as possible: Allergic reactions--skin rash, itching, hives, swelling of the face, lips, tongue, or throat Dry cough, shortness of breath or trouble breathing Increased saliva or tears, increased sweating, stomach cramping, diarrhea, small pupils, unusual weakness or fatigue, slow heartbeat Infection--fever, chills, cough, sore throat, wounds that don't heal, pain or trouble when passing urine, general feeling of discomfort or being unwell Kidney injury--decrease in the amount of urine, swelling of the ankles, hands, or feet Low red blood cell level--unusual weakness or fatigue, dizziness, headache, trouble breathing Severe or prolonged diarrhea Unusual bruising or bleeding Side effects that usually do not require medical attention (report to your care team if they continue or are bothersome): Constipation Diarrhea Hair loss Loss of appetite Nausea Stomach pain This list may not describe all possible side effects. Call your doctor for medical advice about side effects. You may report side effects to FDA at 1-800-FDA-1088. Where should I keep my medication? This medication is given in a hospital or clinic. It will not be stored at home. NOTE: This sheet is a summary. It may not cover all possible information. If you have questions about this medicine, talk to your doctor, pharmacist, or health care provider.  2023 Elsevier/Gold Standard (2022-01-11 00:00:00)

## 2023-01-04 ENCOUNTER — Inpatient Hospital Stay: Payer: Medicaid Other

## 2023-01-04 VITALS — BP 125/87 | HR 89 | Temp 98.0°F | Resp 18 | Ht 59.0 in | Wt 133.0 lb

## 2023-01-04 DIAGNOSIS — Z5111 Encounter for antineoplastic chemotherapy: Secondary | ICD-10-CM | POA: Diagnosis not present

## 2023-01-04 DIAGNOSIS — C786 Secondary malignant neoplasm of retroperitoneum and peritoneum: Secondary | ICD-10-CM

## 2023-01-04 MED ORDER — HEPARIN SOD (PORK) LOCK FLUSH 100 UNIT/ML IV SOLN
500.0000 [IU] | Freq: Once | INTRAVENOUS | Status: AC | PRN
  Administered 2023-01-04: 500 [IU]

## 2023-01-04 MED ORDER — SODIUM CHLORIDE 0.9% FLUSH
10.0000 mL | INTRAVENOUS | Status: DC | PRN
  Administered 2023-01-04: 10 mL

## 2023-01-04 NOTE — Patient Instructions (Signed)
Fluorouracil Injection What is this medication? FLUOROURACIL (flure oh YOOR a sil) treats some types of cancer. It works by slowing down the growth of cancer cells. This medicine may be used for other purposes; ask your health care provider or pharmacist if you have questions. COMMON BRAND NAME(S): Adrucil What should I tell my care team before I take this medication? They need to know if you have any of these conditions: Blood disorders Dihydropyrimidine dehydrogenase (DPD) deficiency Infection, such as chickenpox, cold sores, herpes Kidney disease Liver disease Poor nutrition Recent or ongoing radiation therapy An unusual or allergic reaction to fluorouracil, other medications, foods, dyes, or preservatives If you or your partner are pregnant or trying to get pregnant Breast-feeding How should I use this medication? This medication is injected into a vein. It is administered by your care team in a hospital or clinic setting. Talk to your care team about the use of this medication in children. Special care may be needed. Overdosage: If you think you have taken too much of this medicine contact a poison control center or emergency room at once. NOTE: This medicine is only for you. Do not share this medicine with others. What if I miss a dose? Keep appointments for follow-up doses. It is important not to miss your dose. Call your care team if you are unable to keep an appointment. What may interact with this medication? Do not take this medication with any of the following: Live virus vaccines This medication may also interact with the following: Medications that treat or prevent blood clots, such as warfarin, enoxaparin, dalteparin This list may not describe all possible interactions. Give your health care provider a list of all the medicines, herbs, non-prescription drugs, or dietary supplements you use. Also tell them if you smoke, drink alcohol, or use illegal drugs. Some items may  interact with your medicine. What should I watch for while using this medication? Your condition will be monitored carefully while you are receiving this medication. This medication may make you feel generally unwell. This is not uncommon as chemotherapy can affect healthy cells as well as cancer cells. Report any side effects. Continue your course of treatment even though you feel ill unless your care team tells you to stop. In some cases, you may be given additional medications to help with side effects. Follow all directions for their use. This medication may increase your risk of getting an infection. Call your care team for advice if you get a fever, chills, sore throat, or other symptoms of a cold or flu. Do not treat yourself. Try to avoid being around people who are sick. This medication may increase your risk to bruise or bleed. Call your care team if you notice any unusual bleeding. Be careful brushing or flossing your teeth or using a toothpick because you may get an infection or bleed more easily. If you have any dental work done, tell your dentist you are receiving this medication. Avoid taking medications that contain aspirin, acetaminophen, ibuprofen, naproxen, or ketoprofen unless instructed by your care team. These medications may hide a fever. Do not treat diarrhea with over the counter products. Contact your care team if you have diarrhea that lasts more than 2 days or if it is severe and watery. This medication can make you more sensitive to the sun. Keep out of the sun. If you cannot avoid being in the sun, wear protective clothing and sunscreen. Do not use sun lamps, tanning beds, or tanning booths. Talk to   your care team if you or your partner wish to become pregnant or think you might be pregnant. This medication can cause serious birth defects if taken during pregnancy and for 3 months after the last dose. A reliable form of contraception is recommended while taking this  medication and for 3 months after the last dose. Talk to your care team about effective forms of contraception. Do not father a child while taking this medication and for 3 months after the last dose. Use a condom while having sex during this time period. Do not breastfeed while taking this medication. This medication may cause infertility. Talk to your care team if you are concerned about your fertility. What side effects may I notice from receiving this medication? Side effects that you should report to your care team as soon as possible: Allergic reactions--skin rash, itching, hives, swelling of the face, lips, tongue, or throat Heart attack--pain or tightness in the chest, shoulders, arms, or jaw, nausea, shortness of breath, cold or clammy skin, feeling faint or lightheaded Heart failure--shortness of breath, swelling of the ankles, feet, or hands, sudden weight gain, unusual weakness or fatigue Heart rhythm changes--fast or irregular heartbeat, dizziness, feeling faint or lightheaded, chest pain, trouble breathing High ammonia level--unusual weakness or fatigue, confusion, loss of appetite, nausea, vomiting, seizures Infection--fever, chills, cough, sore throat, wounds that don't heal, pain or trouble when passing urine, general feeling of discomfort or being unwell Low red blood cell level--unusual weakness or fatigue, dizziness, headache, trouble breathing Pain, tingling, or numbness in the hands or feet, muscle weakness, change in vision, confusion or trouble speaking, loss of balance or coordination, trouble walking, seizures Redness, swelling, and blistering of the skin over hands and feet Severe or prolonged diarrhea Unusual bruising or bleeding Side effects that usually do not require medical attention (report to your care team if they continue or are bothersome): Dry skin Headache Increased tears Nausea Pain, redness, or swelling with sores inside the mouth or throat Sensitivity  to light Vomiting This list may not describe all possible side effects. Call your doctor for medical advice about side effects. You may report side effects to FDA at 1-800-FDA-1088. Where should I keep my medication? This medication is given in a hospital or clinic. It will not be stored at home. NOTE: This sheet is a summary. It may not cover all possible information. If you have questions about this medicine, talk to your doctor, pharmacist, or health care provider.  2023 Elsevier/Gold Standard (2022-01-02 00:00:00)  

## 2023-01-07 ENCOUNTER — Encounter: Payer: Self-pay | Admitting: Oncology

## 2023-01-08 ENCOUNTER — Encounter: Payer: Self-pay | Admitting: Oncology

## 2023-01-10 ENCOUNTER — Inpatient Hospital Stay: Payer: Medicaid Other | Admitting: Dietician

## 2023-01-10 ENCOUNTER — Telehealth: Payer: Self-pay | Admitting: Dietician

## 2023-01-10 NOTE — Telephone Encounter (Signed)
Attempted to reach patient for a scheduled remote nutrition consult. Provided my cell# on voice mail to return call for her follow up nutrition consult.  Cyndi Kidada Ging, RDN, LDN Registered Dietitian, [facility] Part Time Remote (Usual office hours: Tuesday-Thursday) Cell: [phone]    

## 2023-01-13 NOTE — Progress Notes (Unsigned)
[facility]  [address] [place],  [place]  [address] [phone]  Clinic Day:  01/14/2023  Referring physician: Barbie Banner, MD  HISTORY OF PRESENT ILLNESS:  The patient is a 47 y.o. female  with metastatic pancreatic cancer, with spread of disease to her peritoneum, uterus and ovaries.  She comes in today to be evaluated to head into her 6th cycle of FOLFIRINOX.  The patient claims to have tolerated her 5th cycle of FOLFIRINOX fairly well. She has issues with cold intolerance, which is a side effect related to the oxaliplatin component of her chemotherapy.  She is on Eliquis due to a small right-sided pulmonary embolus seen on February 2024 CT scans.  She has occasional nausea/vomiting that was relieved with oral antiemetics.  She denies having abdominal distention, pain, or other symptoms which concern her for overt signs of disease progression.  Overall, she feels she has been doing much better since her palliative chemotherapy began.  Her history dates back to January 2024 when she first began having lower quadrant abdominal pain.  Over a span of weeks, she began having abdominal swelling and distention.  She also began having increased constipation.  This constellation of symptoms led to her undergoing CT scans of her abdomen/pelvis, which reveal a 7.9 cm cystic pancreatic lesion, with an irregular stomach border.  There was extensive ascites, peritoneal carcinomatosis, bilateral adnexal masses and uterine involvement.  The patient underwent a paracentesis in early February 2024, which revealed malignant cells, consistent with adenocarcinoma.  She also underwent an endometrial biopsy, which revealed metastatic adenocarcinoma.  In particular, immunohistochemical stains were positive for CK7/CDX2/CK20.  PAX8 and ER staining was negative. This pattern was consistent with a malignancy from an upper GI/pancreaticobiliary origin.  Biotheranostics testing showed the  origin being pancreaticobiliary in nature.   PHYSICAL EXAM:  Blood pressure 123/79, pulse 93, temperature 98.7 F (37.1 C), temperature source Oral, resp. rate 16, height 4\' 11"  (1.499 m), weight 133 lb 3.2 oz (60.4 kg), SpO2 98 %, unknown if currently breastfeeding. Wt Readings from Last 3 Encounters:  01/14/23 133 lb 3.2 oz (60.4 kg)  01/04/23 133 lb (60.3 kg)  12/31/22 127 lb 1.6 oz (57.7 kg)   Body mass index is 26.9 kg/m. Performance status (ECOG): 2 - Symptomatic, <50% confined to bed Physical Exam Constitutional:      Appearance: Normal appearance.     Comments: She appears stronger and healthier vs previous visits  HENT:     Mouth/Throat:     Mouth: Mucous membranes are moist.     Pharynx: Oropharynx is clear. No oropharyngeal exudate or posterior oropharyngeal erythema.  Cardiovascular:     Rate and Rhythm: Normal rate and regular rhythm.     Heart sounds: No murmur heard.    No friction rub. No gallop.  Pulmonary:     Effort: Pulmonary effort is normal. No respiratory distress.     Breath sounds: Normal breath sounds. No wheezing, rhonchi or rales.  Abdominal:     General: Bowel sounds are normal. There is no distension.     Palpations: Abdomen is soft. There is no mass.     Tenderness: There is no abdominal tenderness.  Musculoskeletal:        General: No swelling.     Right lower leg: No edema.     Left lower leg: No edema.  Lymphadenopathy:     Cervical: No cervical adenopathy.     Upper Body:  Right upper body: No supraclavicular or axillary adenopathy.     Left upper body: No supraclavicular or axillary adenopathy.     Lower Body: No right inguinal adenopathy. No left inguinal adenopathy.  Skin:    General: Skin is warm.     Coloration: Skin is not jaundiced.     Findings: No lesion or rash.  Neurological:     General: No focal deficit present.     Mental Status: She is alert and oriented to person, place, and time. Mental status is at baseline.   Psychiatric:        Mood and Affect: Mood normal.        Behavior: Behavior normal.        Thought Content: Thought content normal.    LABS:      Latest Ref Rng & Units 01/14/2023   12:00 AM 12/31/2022   12:00 AM 12/17/2022   12:00 AM  CBC  WBC  4.6     5.0     2.3      Hemoglobin 12.0 - 16.0 9.9     9.8     8.9      Hematocrit 36 - 46 31     32     29      Platelets 150 - 400 K/uL 192     355     289         This result is from an external source.      Latest Ref Rng & Units 01/14/2023    9:10 AM 12/31/2022   12:00 AM 12/18/2022    9:38 AM  CMP  Glucose 70 - 99 mg/dL 161   096   BUN 6 - 20 mg/dL 11  10     12    Creatinine 0.44 - 1.00 mg/dL 0.45  0.4     4.09   Sodium 135 - 145 mmol/L 136  132     134   Potassium 3.5 - 5.1 mmol/L 2.5  3.3     3.6   Chloride 98 - 111 mmol/L 99  96     100   CO2 22 - 32 mmol/L 26  26     24    Calcium 8.9 - 10.3 mg/dL 8.5  8.7     8.5   Total Protein 6.5 - 8.1 g/dL 6.3   5.9   Total Bilirubin 0.3 - 1.2 mg/dL 0.3   0.2   Alkaline Phos 38 - 126 U/L 85  142     95   AST 15 - 41 U/L 17  19     13    ALT 0 - 44 U/L 21  22     21       This result is from an external source.   ASSESSMENT & PLAN:  A 47 y.o. female with metastatic pancreatic cancer. She will proceed with her 6th cycle of FOLFIRINOX this week. Clinically, the patient continues to do very well. I will see her back in 2 weeks before she heads into her 7th cycle of FOLFIRINOX. The patient understands all the plans discussed today and is in agreement with them.    Giavanni Zeitlin Kirby Funk, MD

## 2023-01-14 ENCOUNTER — Inpatient Hospital Stay: Payer: Medicaid Other

## 2023-01-14 ENCOUNTER — Encounter: Payer: Self-pay | Admitting: Oncology

## 2023-01-14 ENCOUNTER — Other Ambulatory Visit: Payer: Self-pay | Admitting: Oncology

## 2023-01-14 ENCOUNTER — Inpatient Hospital Stay (INDEPENDENT_AMBULATORY_CARE_PROVIDER_SITE_OTHER): Payer: Medicaid Other | Admitting: Oncology

## 2023-01-14 DIAGNOSIS — E876 Hypokalemia: Secondary | ICD-10-CM | POA: Insufficient documentation

## 2023-01-14 DIAGNOSIS — D500 Iron deficiency anemia secondary to blood loss (chronic): Secondary | ICD-10-CM

## 2023-01-14 DIAGNOSIS — Z5111 Encounter for antineoplastic chemotherapy: Secondary | ICD-10-CM | POA: Diagnosis not present

## 2023-01-14 DIAGNOSIS — C786 Secondary malignant neoplasm of retroperitoneum and peritoneum: Secondary | ICD-10-CM | POA: Diagnosis not present

## 2023-01-14 LAB — CMP (CANCER CENTER ONLY)
ALT: 21 U/L (ref 0–44)
AST: 17 U/L (ref 15–41)
Albumin: 2.7 g/dL — ABNORMAL LOW (ref 3.5–5.0)
Alkaline Phosphatase: 85 U/L (ref 38–126)
Anion gap: 11 (ref 5–15)
BUN: 11 mg/dL (ref 6–20)
CO2: 26 mmol/L (ref 22–32)
Calcium: 8.5 mg/dL — ABNORMAL LOW (ref 8.9–10.3)
Chloride: 99 mmol/L (ref 98–111)
Creatinine: 0.56 mg/dL (ref 0.44–1.00)
GFR, Estimated: >60 mL/min (ref >60)
Glucose, Bld: 137 mg/dL — ABNORMAL HIGH (ref 70–99)
Potassium: 2.5 mmol/L — CL (ref 3.5–5.1)
Sodium: 136 mmol/L (ref 135–145)
Total Bilirubin: 0.3 mg/dL (ref 0.3–1.2)
Total Protein: 6.3 g/dL — ABNORMAL LOW (ref 6.5–8.1)

## 2023-01-14 LAB — CBC AND DIFFERENTIAL
HCT: 31 — AB (ref 36–46)
Hemoglobin: 9.9 — AB (ref 12.0–16.0)
Neutrophils Absolute: 2.85
Platelets: 192 10*3/uL (ref 150–400)
WBC: 4.6

## 2023-01-14 LAB — CBC: RBC: 3.93 (ref 3.87–5.11)

## 2023-01-14 LAB — CBC W DIFFERENTIAL (~~LOC~~ CC SCANNED REPORT)

## 2023-01-14 NOTE — Progress Notes (Signed)
Sent in a request for DOS 01/15/2023 and 01/16/2023 to [employer].

## 2023-01-14 NOTE — Progress Notes (Signed)
CRITICAL VALUE STICKER  CRITICAL VALUE:   K+ 2.5  RECEIVER (on-site recipient of call):  Dyane Dustman RN   DATE & TIME NOTIFIED:  01/14/2023 @ 1204   MESSENGER (representative from lab:  Tammy @ [facility]  MD NOTIFIED:  Dr. Melvyn Neth    TIME OF NOTIFICATION:  1210  RESPONSE: Scheduling IV K+ tomorrow and Wednesday.

## 2023-01-15 ENCOUNTER — Other Ambulatory Visit: Payer: Self-pay

## 2023-01-15 ENCOUNTER — Inpatient Hospital Stay: Payer: Medicaid Other

## 2023-01-15 VITALS — BP 104/70 | HR 92 | Temp 98.8°F | Resp 18 | Ht 59.0 in | Wt 135.0 lb

## 2023-01-15 DIAGNOSIS — Z5111 Encounter for antineoplastic chemotherapy: Secondary | ICD-10-CM | POA: Diagnosis not present

## 2023-01-15 DIAGNOSIS — E876 Hypokalemia: Secondary | ICD-10-CM

## 2023-01-15 MED ORDER — HEPARIN SOD (PORK) LOCK FLUSH 100 UNIT/ML IV SOLN
500.0000 [IU] | Freq: Once | INTRAVENOUS | Status: AC | PRN
  Administered 2023-01-15: 500 [IU]

## 2023-01-15 MED ORDER — SODIUM CHLORIDE 0.9 % IV SOLN: Freq: Once | INTRAVENOUS | Status: AC

## 2023-01-15 MED ORDER — ALTEPLASE 2 MG IJ SOLR
2.0000 mg | Freq: Once | INTRAMUSCULAR | Status: AC | PRN
  Administered 2023-01-15: 2 mg
  Filled 2023-01-15: qty 2

## 2023-01-15 MED ORDER — SODIUM CHLORIDE 0.9% FLUSH
10.0000 mL | Freq: Once | INTRAVENOUS | Status: AC | PRN
  Administered 2023-01-15: 10 mL

## 2023-01-15 MED ORDER — POTASSIUM CHLORIDE 10 MEQ/100ML IV SOLN
10.0000 meq | INTRAVENOUS | Status: AC
  Administered 2023-01-15 (×4): 10 meq via INTRAVENOUS
  Filled 2023-01-15: qty 100

## 2023-01-15 MED FILL — Dexamethasone Sodium Phosphate Inj 100 MG/10ML: INTRAMUSCULAR | Qty: 1 | Status: AC

## 2023-01-15 MED FILL — Fluorouracil IV Soln 5 GM/100ML (50 MG/ML): INTRAVENOUS | Qty: 77 | Status: AC

## 2023-01-15 MED FILL — Leucovorin Calcium For Inj 350 MG: INTRAMUSCULAR | Qty: 31.8 | Status: AC

## 2023-01-15 MED FILL — Irinotecan HCl Inj 100 MG/5ML (20 MG/ML): INTRAVENOUS | Qty: 12 | Status: AC

## 2023-01-15 MED FILL — Fosaprepitant Dimeglumine For IV Infusion 150 MG (Base Eq): INTRAVENOUS | Qty: 5 | Status: AC

## 2023-01-15 NOTE — Patient Instructions (Signed)
Potassium Chloride Injection What is this medication? POTASSIUM CHLORIDE (poe TASS i um KLOOR ide) prevents and treats low levels of potassium in your body. Potassium plays an important role in maintaining the health of your kidneys, heart, muscles, and nervous system. This medicine may be used for other purposes; ask your health care provider or pharmacist if you have questions. COMMON BRAND NAME(S): PROAMP What should I tell my care team before I take this medication? They need to know if you have any of these conditions: Addison disease Dehydration Diabetes (high blood sugar) Heart disease High levels of potassium in the blood Irregular heartbeat or rhythm Kidney disease Large areas of burned skin An unusual or allergic reaction to potassium, other medications, foods, dyes, or preservatives Pregnant or trying to get pregnant Breast-feeding How should I use this medication? This medication is injected into a vein. It is given in a hospital or clinic setting. Talk to your care team about the use of this medication in children. Special care may be needed. Overdosage: If you think you have taken too much of this medicine contact a poison control center or emergency room at once. NOTE: This medicine is only for you. Do not share this medicine with others. What if I miss a dose? This does not apply. This medication is not for regular use. What may interact with this medication? Do not take this medication with any of the following: Certain diuretics, such as spironolactone, triamterene Eplerenone Sodium polystyrene sulfonate This medication may also interact with the following: Certain medications for blood pressure or heart disease, such as lisinopril, losartan, quinapril, valsartan Medications that lower your chance of fighting infection, such as cyclosporine, tacrolimus NSAIDs, medications for pain and inflammation, such as ibuprofen or naproxen Other potassium supplements Salt  substitutes This list may not describe all possible interactions. Give your health care provider a list of all the medicines, herbs, non-prescription drugs, or dietary supplements you use. Also tell them if you smoke, drink alcohol, or use illegal drugs. Some items may interact with your medicine. What should I watch for while using this medication? Visit your care team for regular checks on your progress. Tell your care team if your symptoms do not start to get better or if they get worse. You may need blood work while you are taking this medication. Avoid salt substitutes unless you are told otherwise by your care team. What side effects may I notice from receiving this medication? Side effects that you should report to your care team as soon as possible: Allergic reactions--skin rash, itching, hives, swelling of the face, lips, tongue, or throat High potassium level--muscle weakness, fast or irregular heartbeat Side effects that usually do not require medical attention (report to your care team if they continue or are bothersome): Diarrhea Nausea Stomach pain Vomiting This list may not describe all possible side effects. Call your doctor for medical advice about side effects. You may report side effects to FDA at 1-800-FDA-1088. Where should I keep my medication? This medication is given in a hospital or clinic. It will not be stored at home. NOTE: This sheet is a summary. It may not cover all possible information. If you have questions about this medicine, talk to your doctor, pharmacist, or health care provider.  2023 Elsevier/Gold Standard (2007-10-25 00:00:00)  

## 2023-01-16 ENCOUNTER — Ambulatory Visit: Payer: Medicaid Other | Admitting: Dietician

## 2023-01-16 ENCOUNTER — Inpatient Hospital Stay: Payer: Medicaid Other | Attending: Oncology

## 2023-01-16 ENCOUNTER — Other Ambulatory Visit: Payer: Self-pay

## 2023-01-16 VITALS — BP 116/63 | HR 104 | Temp 98.0°F | Resp 18 | Ht 59.0 in | Wt 136.0 lb

## 2023-01-16 DIAGNOSIS — C250 Malignant neoplasm of head of pancreas: Secondary | ICD-10-CM | POA: Diagnosis not present

## 2023-01-16 DIAGNOSIS — C786 Secondary malignant neoplasm of retroperitoneum and peritoneum: Secondary | ICD-10-CM

## 2023-01-16 DIAGNOSIS — Z5111 Encounter for antineoplastic chemotherapy: Secondary | ICD-10-CM | POA: Diagnosis present

## 2023-01-16 DIAGNOSIS — Z5189 Encounter for other specified aftercare: Secondary | ICD-10-CM | POA: Diagnosis not present

## 2023-01-16 MED ORDER — SODIUM CHLORIDE 0.9 % IV SOLN
2425.0000 mg/m2 | INTRAVENOUS | Status: DC
  Administered 2023-01-16: 3850 mg via INTRAVENOUS
  Filled 2023-01-16: qty 77

## 2023-01-16 MED ORDER — FENTANYL 75 MCG/HR TD PT72
1.0000 | MEDICATED_PATCH | TRANSDERMAL | 0 refills | Status: DC
Start: 2023-01-16 — End: 2023-02-22

## 2023-01-16 MED ORDER — DEXTROSE 5 % IV SOLN: Freq: Once | INTRAVENOUS | Status: AC

## 2023-01-16 MED ORDER — ATROPINE SULFATE 1 MG/ML IV SOLN
0.5000 mg | Freq: Once | INTRAVENOUS | Status: AC | PRN
  Administered 2023-01-16: 0.5 mg via INTRAVENOUS
  Filled 2023-01-16: qty 1

## 2023-01-16 MED ORDER — SODIUM CHLORIDE 0.9 % IV SOLN
10.0000 mg | Freq: Once | INTRAVENOUS | Status: AC
  Administered 2023-01-16: 10 mg via INTRAVENOUS
  Filled 2023-01-16: qty 10

## 2023-01-16 MED ORDER — LEUCOVORIN CALCIUM INJECTION 350 MG
400.0000 mg/m2 | Freq: Once | INTRAVENOUS | Status: AC
  Administered 2023-01-16: 636 mg via INTRAVENOUS
  Filled 2023-01-16: qty 31.8

## 2023-01-16 MED ORDER — PALONOSETRON HCL INJECTION 0.25 MG/5ML
0.2500 mg | Freq: Once | INTRAVENOUS | Status: AC
  Administered 2023-01-16: 0.25 mg via INTRAVENOUS
  Filled 2023-01-16: qty 5

## 2023-01-16 MED ORDER — IRINOTECAN HCL CHEMO INJECTION 100 MG/5ML
150.0000 mg/m2 | Freq: Once | INTRAVENOUS | Status: AC
  Administered 2023-01-16: 240 mg via INTRAVENOUS
  Filled 2023-01-16: qty 10

## 2023-01-16 MED ORDER — OXALIPLATIN CHEMO INJECTION 100 MG/20ML
84.0000 mg/m2 | Freq: Once | INTRAVENOUS | Status: AC
  Administered 2023-01-16: 135 mg via INTRAVENOUS
  Filled 2023-01-16: qty 20

## 2023-01-16 MED ORDER — SODIUM CHLORIDE 0.9 % IV SOLN
150.0000 mg | Freq: Once | INTRAVENOUS | Status: AC
  Administered 2023-01-16: 150 mg via INTRAVENOUS
  Filled 2023-01-16: qty 150

## 2023-01-16 NOTE — Progress Notes (Signed)
Nutrition Follow-up:  Called patient at home telephone number during infusion to follow up on PO intake and NIS. She reports feeling better has been able to enjoy getting out and about with friends and hiking.  Bowels regular and formed. Seemed to tolerate the Banantrol which helped (added to applesauce). She did have about 4 days dry heaves for 30 minutes was able to keep down a t-bone steak, baked potato but then vomited just strawberries that she ate later.  Not losing much fluid with emesis and trying to use carbonated sparling waters to aid in nausea control (doesn't like ginger ale).  Not tolerating bananas mouth too dry to eat to whole and smoothies aren't working with cold intolerance.. warm smoothies too thick and she doesn't tolerate. Getting groceries delivered and she's prepping meals. Eating red meat 3 times a week.  Fluids:  Gatorade, 3-4 bottles/day, 2 liters water, 2-3 ONS only when she doesn't feel like she can keep food down  Medications: Compazine for nausea  Labs: 01/14/23  K+2.5 (IV K+ yesterday)  Anthropometrics: Fluctuating some r/t  fluids  Height: 59" Weight:  01/15/23  135# 12/31/22 127# 12/21/22  135# 12/07/22  142# 12/05/22  147# 11/27/22  138.4 11/18/22  141.7# 11/17/22  151.6# UBW: 170-180 BMI: 28.7   Estimated Nutritional Needs:    Kcal:  1500-1700   Protein:  75-90 g   Fluid:  1.5L/day  NUTRITION DIAGNOSIS: Inadequate intake Improving with some fluctuations in intake and weight.  Improved with symptom management   MALNUTRITION DIAGNOSIS: Moderate Malnutrition related to chronic illness, cancer and cancer related treatments as evidenced by mild fat depletion, moderate muscle depletion.    INTERVENTION:  Reviewed high potassium foods. Emailed tip sheet for high potassium foods. Encouraged continued use of electrolyte replacement liquids. Encouraged 3 meals of red meat/week to improve Hgb. Will provide biolyte samples for pick up prior to MD follow  up.  MONITORING, EVALUATION, GOAL: weight, PO intake, Nutrition Impact Symptoms, labs Goal is weight maintenance  NEXT VISIT: Remote during infusion 5/29   Gennaro Africa, RDN, LDN Registered Dietitian, [facility] Part Time Remote (Usual office hours: Tuesday-Thursday) Mobile: [phone]

## 2023-01-16 NOTE — Patient Instructions (Signed)
Fluorouracil Injection What is this medication? FLUOROURACIL (flure oh YOOR a sil) treats some types of cancer. It works by slowing down the growth of cancer cells. This medicine may be used for other purposes; ask your health care provider or pharmacist if you have questions. COMMON BRAND NAME(S): Adrucil What should I tell my care team before I take this medication? They need to know if you have any of these conditions: Blood disorders Dihydropyrimidine dehydrogenase (DPD) deficiency Infection, such as chickenpox, cold sores, herpes Kidney disease Liver disease Poor nutrition Recent or ongoing radiation therapy An unusual or allergic reaction to fluorouracil, other medications, foods, dyes, or preservatives If you or your partner are pregnant or trying to get pregnant Breast-feeding How should I use this medication? This medication is injected into a vein. It is administered by your care team in a hospital or clinic setting. Talk to your care team about the use of this medication in children. Special care may be needed. Overdosage: If you think you have taken too much of this medicine contact a poison control center or emergency room at once. NOTE: This medicine is only for you. Do not share this medicine with others. What if I miss a dose? Keep appointments for follow-up doses. It is important not to miss your dose. Call your care team if you are unable to keep an appointment. What may interact with this medication? Do not take this medication with any of the following: Live virus vaccines This medication may also interact with the following: Medications that treat or prevent blood clots, such as warfarin, enoxaparin, dalteparin This list may not describe all possible interactions. Give your health care provider a list of all the medicines, herbs, non-prescription drugs, or dietary supplements you use. Also tell them if you smoke, drink alcohol, or use illegal drugs. Some items may  interact with your medicine. What should I watch for while using this medication? Your condition will be monitored carefully while you are receiving this medication. This medication may make you feel generally unwell. This is not uncommon as chemotherapy can affect healthy cells as well as cancer cells. Report any side effects. Continue your course of treatment even though you feel ill unless your care team tells you to stop. In some cases, you may be given additional medications to help with side effects. Follow all directions for their use. This medication may increase your risk of getting an infection. Call your care team for advice if you get a fever, chills, sore throat, or other symptoms of a cold or flu. Do not treat yourself. Try to avoid being around people who are sick. This medication may increase your risk to bruise or bleed. Call your care team if you notice any unusual bleeding. Be careful brushing or flossing your teeth or using a toothpick because you may get an infection or bleed more easily. If you have any dental work done, tell your dentist you are receiving this medication. Avoid taking medications that contain aspirin, acetaminophen, ibuprofen, naproxen, or ketoprofen unless instructed by your care team. These medications may hide a fever. Do not treat diarrhea with over the counter products. Contact your care team if you have diarrhea that lasts more than 2 days or if it is severe and watery. This medication can make you more sensitive to the sun. Keep out of the sun. If you cannot avoid being in the sun, wear protective clothing and sunscreen. Do not use sun lamps, tanning beds, or tanning booths. Talk to   your care team if you or your partner wish to become pregnant or think you might be pregnant. This medication can cause serious birth defects if taken during pregnancy and for 3 months after the last dose. A reliable form of contraception is recommended while taking this  medication and for 3 months after the last dose. Talk to your care team about effective forms of contraception. Do not father a child while taking this medication and for 3 months after the last dose. Use a condom while having sex during this time period. Do not breastfeed while taking this medication. This medication may cause infertility. Talk to your care team if you are concerned about your fertility. What side effects may I notice from receiving this medication? Side effects that you should report to your care team as soon as possible: Allergic reactions--skin rash, itching, hives, swelling of the face, lips, tongue, or throat Heart attack--pain or tightness in the chest, shoulders, arms, or jaw, nausea, shortness of breath, cold or clammy skin, feeling faint or lightheaded Heart failure--shortness of breath, swelling of the ankles, feet, or hands, sudden weight gain, unusual weakness or fatigue Heart rhythm changes--fast or irregular heartbeat, dizziness, feeling faint or lightheaded, chest pain, trouble breathing High ammonia level--unusual weakness or fatigue, confusion, loss of appetite, nausea, vomiting, seizures Infection--fever, chills, cough, sore throat, wounds that don't heal, pain or trouble when passing urine, general feeling of discomfort or being unwell Low red blood cell level--unusual weakness or fatigue, dizziness, headache, trouble breathing Pain, tingling, or numbness in the hands or feet, muscle weakness, change in vision, confusion or trouble speaking, loss of balance or coordination, trouble walking, seizures Redness, swelling, and blistering of the skin over hands and feet Severe or prolonged diarrhea Unusual bruising or bleeding Side effects that usually do not require medical attention (report to your care team if they continue or are bothersome): Dry skin Headache Increased tears Nausea Pain, redness, or swelling with sores inside the mouth or throat Sensitivity  to light Vomiting This list may not describe all possible side effects. Call your doctor for medical advice about side effects. You may report side effects to FDA at 1-800-FDA-1088. Where should I keep my medication? This medication is given in a hospital or clinic. It will not be stored at home. NOTE: This sheet is a summary. It may not cover all possible information. If you have questions about this medicine, talk to your doctor, pharmacist, or health care provider.  2023 [employer] Standard (2022-01-02 00:00:00) Leucovorin Injection What is this medication? LEUCOVORIN (loo koe VOR in) prevents side effects from certain medications, such as methotrexate. It works by increasing folate levels. This helps protect healthy cells in your body. It may also be used to treat anemia caused by low levels of folate. It can also be used with fluorouracil, a type of chemotherapy, to treat colorectal cancer. It works by increasing the effects of fluorouracil in the body. This medicine may be used for other purposes; ask your health care provider or pharmacist if you have questions. What should I tell my care team before I take this medication? They need to know if you have any of these conditions: Anemia from low levels of vitamin B12 in the blood An unusual or allergic reaction to leucovorin, folic acid, other medications, foods, dyes, or preservatives Pregnant or trying to get pregnant Breastfeeding How should I use this medication? This medication is injected into a vein or a muscle. It is given by your care team   in a hospital or clinic setting. Talk to your care team about the use of this medication in children. Special care may be needed. Overdosage: If you think you have taken too much of this medicine contact a poison control center or emergency room at once. NOTE: This medicine is only for you. Do not share this medicine with others. What if I miss a dose? Keep appointments for follow-up doses.  It is important not to miss your dose. Call your care team if you are unable to keep an appointment. What may interact with this medication? Capecitabine Fluorouracil Phenobarbital Phenytoin Primidone Trimethoprim;sulfamethoxazole This list may not describe all possible interactions. Give your health care provider a list of all the medicines, herbs, non-prescription drugs, or dietary supplements you use. Also tell them if you smoke, drink alcohol, or use illegal drugs. Some items may interact with your medicine. What should I watch for while using this medication? Your condition will be monitored carefully while you are receiving this medication. This medication may increase the side effects of 5-fluorouracil. Tell your care team if you have diarrhea or mouth sores that do not get better or that get worse. What side effects may I notice from receiving this medication? Side effects that you should report to your care team as soon as possible: Allergic reactions--skin rash, itching, hives, swelling of the face, lips, tongue, or throat This list may not describe all possible side effects. Call your doctor for medical advice about side effects. You may report side effects to FDA at 1-800-FDA-1088. Where should I keep my medication? This medication is given in a hospital or clinic. It will not be stored at home. NOTE: This sheet is a summary. It may not cover all possible information. If you have questions about this medicine, talk to your doctor, pharmacist, or health care provider.  2023 [employer]/Gold Standard (2022-01-12 00:00:00) Irinotecan Injection What is this medication? IRINOTECAN (ir in oh TEE kan) treats some types of cancer. It works by slowing down the growth of cancer cells. This medicine may be used for other purposes; ask your health care provider or pharmacist if you have questions. COMMON BRAND NAME(S): Camptosar What should I tell my care team before I take this  medication? They need to know if you have any of these conditions: Dehydration Diarrhea Infection, especially a viral infection, such as chickenpox, cold sores, herpes Liver disease Low blood cell levels (white cells, red cells, and platelets) Low levels of electrolytes, such as calcium, magnesium, or potassium in your blood Recent or ongoing radiation An unusual or allergic reaction to irinotecan, other medications, foods, dyes, or preservatives If you or your partner are pregnant or trying to get pregnant Breast-feeding How should I use this medication? This medication is injected into a vein. It is given by your care team in a hospital or clinic setting. Talk to your care team about the use of this medication in children. Special care may be needed. Overdosage: If you think you have taken too much of this medicine contact a poison control center or emergency room at once. NOTE: This medicine is only for you. Do not share this medicine with others. What if I miss a dose? Keep appointments for follow-up doses. It is important not to miss your dose. Call your care team if you are unable to keep an appointment. What may interact with this medication? Do not take this medication with any of the following: Cobicistat Itraconazole This medication may also interact with the following:   Certain antibiotics, such as clarithromycin, rifampin, rifabutin Certain antivirals for HIV or AIDS Certain medications for fungal infections, such as ketoconazole, posaconazole, voriconazole Certain medications for seizures, such as carbamazepine, phenobarbital, phenytoin Gemfibrozil Nefazodone St. John's wort This list may not describe all possible interactions. Give your health care provider a list of all the medicines, herbs, non-prescription drugs, or dietary supplements you use. Also tell them if you smoke, drink alcohol, or use illegal drugs. Some items may interact with your medicine. What should I  watch for while using this medication? Your condition will be monitored carefully while you are receiving this medication. You may need blood work while taking this medication. This medication may make you feel generally unwell. This is not uncommon as chemotherapy can affect healthy cells as well as cancer cells. Report any side effects. Continue your course of treatment even though you feel ill unless your care team tells you to stop. This medication can cause serious side effects. To reduce the risk, your care team may give you other medications to take before receiving this one. Be sure to follow the directions from your care team. This medication may affect your coordination, reaction time, or judgement. Do not drive or operate machinery until you know how this medication affects you. Sit up or stand slowly to reduce the risk of dizzy or fainting spells. Drinking alcohol with this medication can increase the risk of these side effects. This medication may increase your risk of getting an infection. Call your care team for advice if you get a fever, chills, sore throat, or other symptoms of a cold or flu. Do not treat yourself. Try to avoid being around people who are sick. Avoid taking medications that contain aspirin, acetaminophen, ibuprofen, naproxen, or ketoprofen unless instructed by your care team. These medications may hide a fever. This medication may increase your risk to bruise or bleed. Call your care team if you notice any unusual bleeding. Be careful brushing or flossing your teeth or using a toothpick because you may get an infection or bleed more easily. If you have any dental work done, tell your dentist you are receiving this medication. Talk to your care team if you or your partner are pregnant or think either of you might be pregnant. This medication can cause serious birth defects if taken during pregnancy and for 6 months after the last dose. You will need a negative pregnancy test  before starting this medication. Contraception is recommended while taking this medication and for 6 months after the last dose. Your care team can help you find the option that works for you. Do not father a child while taking this medication and for 3 months after the last dose. Use a condom for contraception during this time period. Do not breastfeed while taking this medication and for 7 days after the last dose. This medication may cause infertility. Talk to your care team if you are concerned about your fertility. What side effects may I notice from receiving this medication? Side effects that you should report to your care team as soon as possible: Allergic reactions--skin rash, itching, hives, swelling of the face, lips, tongue, or throat Dry cough, shortness of breath or trouble breathing Increased saliva or tears, increased sweating, stomach cramping, diarrhea, small pupils, unusual weakness or fatigue, slow heartbeat Infection--fever, chills, cough, sore throat, wounds that don't heal, pain or trouble when passing urine, general feeling of discomfort or being unwell Kidney injury--decrease in the amount of urine, swelling of the   ankles, hands, or feet Low red blood cell level--unusual weakness or fatigue, dizziness, headache, trouble breathing Severe or prolonged diarrhea Unusual bruising or bleeding Side effects that usually do not require medical attention (report to your care team if they continue or are bothersome): Constipation Diarrhea Hair loss Loss of appetite Nausea Stomach pain This list may not describe all possible side effects. Call your doctor for medical advice about side effects. You may report side effects to FDA at 1-800-FDA-1088. Where should I keep my medication? This medication is given in a hospital or clinic. It will not be stored at home. NOTE: This sheet is a summary. It may not cover all possible information. If you have questions about this medicine, talk  to your doctor, pharmacist, or health care provider.  2023 [employer]/Gold Standard (2022-01-11 00:00:00) Oxaliplatin Injection What is this medication? OXALIPLATIN (ox AL i PLA tin) treats colorectal cancer. It works by slowing down the growth of cancer cells. This medicine may be used for other purposes; ask your health care provider or pharmacist if you have questions. COMMON BRAND NAME(S): Eloxatin What should I tell my care team before I take this medication? They need to know if you have any of these conditions: Heart disease History of irregular heartbeat or rhythm Liver disease Low blood cell levels (white cells, red cells, and platelets) Lung or breathing disease, such as asthma Take medications that treat or prevent blood clots Tingling of the fingers, toes, or other nerve disorder An unusual or allergic reaction to oxaliplatin, other medications, foods, dyes, or preservatives If you or your partner are pregnant or trying to get pregnant Breast-feeding How should I use this medication? This medication is injected into a vein. It is given by your care team in a hospital or clinic setting. Talk to your care team about the use of this medication in children. Special care may be needed. Overdosage: If you think you have taken too much of this medicine contact a poison control center or emergency room at once. NOTE: This medicine is only for you. Do not share this medicine with others. What if I miss a dose? Keep appointments for follow-up doses. It is important not to miss a dose. Call your care team if you are unable to keep an appointment. What may interact with this medication? Do not take this medication with any of the following: Cisapride Dronedarone Pimozide Thioridazine This medication may also interact with the following: Aspirin and aspirin-like medications Certain medications that treat or prevent blood clots, such as warfarin, apixaban, dabigatran, and  rivaroxaban Cisplatin Cyclosporine Diuretics Medications for infection, such as acyclovir, adefovir, amphotericin B, bacitracin, cidofovir, foscarnet, ganciclovir, gentamicin, pentamidine, vancomycin NSAIDs, medications for pain and inflammation, such as ibuprofen or naproxen Other medications that cause heart rhythm changes Pamidronate Zoledronic acid This list may not describe all possible interactions. Give your health care provider a list of all the medicines, herbs, non-prescription drugs, or dietary supplements you use. Also tell them if you smoke, drink alcohol, or use illegal drugs. Some items may interact with your medicine. What should I watch for while using this medication? Your condition will be monitored carefully while you are receiving this medication. You may need blood work while taking this medication. This medication may make you feel generally unwell. This is not uncommon as chemotherapy can affect healthy cells as well as cancer cells. Report any side effects. Continue your course of treatment even though you feel ill unless your care team tells you to stop.   This medication may increase your risk of getting an infection. Call your care team for advice if you get a fever, chills, sore throat, or other symptoms of a cold or flu. Do not treat yourself. Try to avoid being around people who are sick. Avoid taking medications that contain aspirin, acetaminophen, ibuprofen, naproxen, or ketoprofen unless instructed by your care team. These medications may hide a fever. Be careful brushing or flossing your teeth or using a toothpick because you may get an infection or bleed more easily. If you have any dental work done, tell your dentist you are receiving this medication. This medication can make you more sensitive to cold. Do not drink cold drinks or use ice. Cover exposed skin before coming in contact with cold temperatures or cold objects. When out in cold weather wear warm clothing  and cover your mouth and nose to warm the air that goes into your lungs. Tell your care team if you get sensitive to the cold. Talk to your care team if you or your partner are pregnant or think either of you might be pregnant. This medication can cause serious birth defects if taken during pregnancy and for 9 months after the last dose. A negative pregnancy test is required before starting this medication. A reliable form of contraception is recommended while taking this medication and for 9 months after the last dose. Talk to your care team about effective forms of contraception. Do not father a child while taking this medication and for 6 months after the last dose. Use a condom while having sex during this time period. Do not breastfeed while taking this medication and for 3 months after the last dose. This medication may cause infertility. Talk to your care team if you are concerned about your fertility. What side effects may I notice from receiving this medication? Side effects that you should report to your care team as soon as possible: Allergic reactions--skin rash, itching, hives, swelling of the face, lips, tongue, or throat Bleeding--bloody or black, tar-like stools, vomiting blood or brown material that looks like coffee grounds, red or dark brown urine, small red or purple spots on skin, unusual bruising or bleeding Dry cough, shortness of breath or trouble breathing Heart rhythm changes--fast or irregular heartbeat, dizziness, feeling faint or lightheaded, chest pain, trouble breathing Infection--fever, chills, cough, sore throat, wounds that don't heal, pain or trouble when passing urine, general feeling of discomfort or being unwell Liver injury--right upper belly pain, loss of appetite, nausea, light-colored stool, dark yellow or brown urine, yellowing skin or eyes, unusual weakness or fatigue Low red blood cell level--unusual weakness or fatigue, dizziness, headache, trouble  breathing Muscle injury--unusual weakness or fatigue, muscle pain, dark yellow or brown urine, decrease in amount of urine Pain, tingling, or numbness in the hands or feet Sudden and severe headache, confusion, change in vision, seizures, which may be signs of posterior reversible encephalopathy syndrome (PRES) Unusual bruising or bleeding Side effects that usually do not require medical attention (report to your care team if they continue or are bothersome): Diarrhea Nausea Pain, redness, or swelling with sores inside the mouth or throat Unusual weakness or fatigue Vomiting This list may not describe all possible side effects. Call your doctor for medical advice about side effects. You may report side effects to FDA at 1-800-FDA-1088. Where should I keep my medication? This medication is given in a hospital or clinic. It will not be stored at home. NOTE: This sheet is a summary. It may   not cover all possible information. If you have questions about this medicine, talk to your doctor, pharmacist, or health care provider.  2023 [employer]/Gold Standard (2007-10-25 00:00:00)  

## 2023-01-18 ENCOUNTER — Inpatient Hospital Stay: Payer: Medicaid Other

## 2023-01-18 VITALS — BP 120/73 | HR 94 | Temp 98.1°F | Resp 18 | Ht 59.0 in | Wt 135.0 lb

## 2023-01-18 DIAGNOSIS — Z5111 Encounter for antineoplastic chemotherapy: Secondary | ICD-10-CM | POA: Diagnosis not present

## 2023-01-18 DIAGNOSIS — C786 Secondary malignant neoplasm of retroperitoneum and peritoneum: Secondary | ICD-10-CM

## 2023-01-18 MED ORDER — SODIUM CHLORIDE 0.9% FLUSH
10.0000 mL | INTRAVENOUS | Status: DC | PRN
  Administered 2023-01-18: 10 mL

## 2023-01-18 MED ORDER — HEPARIN SOD (PORK) LOCK FLUSH 100 UNIT/ML IV SOLN
500.0000 [IU] | Freq: Once | INTRAVENOUS | Status: AC | PRN
  Administered 2023-01-18: 500 [IU]

## 2023-01-18 NOTE — Patient Instructions (Signed)
Managing Chemotherapy Side Effects, Adult Chemotherapy is a treatment that uses medicine to kill cancer cells. However, in addition to killing cancer cells, the medicines can also damage healthy cells. The damage to healthy cells can lead to side effects. The exact side effects depend on the specific medicines used. Most of the side effects of chemotherapy go away once treatment is finished. Until then, work closely with your health care providers and take an active role in managing your side effects. What are common side effects of chemotherapy? Increased risk of infection, bruising, or bleeding. Nausea and vomiting. Constipation or diarrhea. Loss of appetite. Hair loss. Mouth or throat sores. Tiredness (fatigue). Tingling, pain, or numbness in the hands and feet. Dry, sensitive, itchy, or sore skin. Sleep disturbances, such as excessive sleepiness. Confusion, anxiety, or mood swings. Memory changes. How to manage the side effects of chemotherapy Medicines Take over-the-counter and prescription medicines only as told by your health care provider. Talk with your health care provider before taking vitamins, herbs, supplements, or over-the-counter medicines. Some of these can interfere with chemotherapy. Activity Get plenty of rest. Get regular exercise by doing activities such as walking, gentle yoga, or tai chi. Return to your normal activities as told by your health care provider. Ask your health care provider what activities are safe for you. Eating and drinking  Talk to a dietitian about what you should eat and drink during cancer treatment. Drink enough fluid to keep your urine pale yellow. If you have side effects that affect eating, these tips may help: Eat smaller meals and snacks often. Drink high-nutrition and high-calorie shakes or supplements. Choose bland and soft foods that are easy to eat. Do not eat foods that are hot, spicy, or hard to swallow. Do not eat raw or  undercooked meat, eggs, or seafood. Always wash fresh fruits and vegetables well before eating them. Skin care If you have sore or itchy skin: Wear soft, comfortable clothing. Apply creams and ointments to your skin as told by your health care provider. If you lose your hair, consider wearing a wig, hat, or scarf to cover your head. You may want to have someone shave your head as you start to lose hair. During outdoor activities, protect your head and skin from the sun by using sunscreen with an SPF of 30 or higher or by wearing protective clothing and a hat. Meet with a hair and skin care specialist for makeup and skin care tips. Apply sunscreen to your scalp as told by your health care provider. General tips Learn as much as you can about your condition. If you are struggling emotionally, talk with a mental health care provider or join a support group. Keep all follow-up visits. This is important. How to prevent infection and bleeding Chemotherapy may lower your blood counts and put you at risk for infection and bleeding. Here are some ways to help prevent problems. Vaccines Talk to your health care provider about vaccines. You should not get any live vaccines, such as the polio, MMR, chickenpox, and shingles vaccines until your health care provider says that it is safe to do so. Do not be around people who have had live vaccines for as long as your health care provider recommends. Make sure you get a yearly flu shot. People who will be near you should also get a yearly flu shot. Social activity Stay away from crowded places where you could be exposed to germs. Do not be around people who may be sick or   people who have symptoms of a fever until they have been fever-free for at least 24 hours. Do not share food, cups, straws, or utensils with other people. Wear a mask when outside the home if your blood counts are low. Cleanliness  Wash your hands often for at least 20 seconds. Also make  sure that other members of your household wash their hands often. Brush your teeth twice daily using a soft toothbrush. Use mouth rinse only as told by your health care provider. Take a bath or shower daily unless your health care provider gives different instructions. General tips Take your temperature regularly, especially if you have chills or feel warm. Check with your health care provider: Before you travel. Before you have a dental procedure. Before you use a swimming pool, hot tub, or swim in a lake or ocean. If you get chemotherapy through an IV or port, check the site every day for signs of infection. Check for redness, swelling, pain, fluid, and warmth. Avoid activities that put you at risk for injury or bleeding. Use an electric razor to shave instead of a blade. Questions to ask your health care provider What are the most common side effects of my treatment? How will they affect my daily life? What can I do to manage them? What are some possible long-term side effects? What are possible complications? What support services are available? What number can I call with questions or concerns? Where to find support Cancer affects the entire family. Find out what family support resources are available from your cancer treatment center. For more support, turn to: Your cancer care team. Friends and family. Your religious community. Other people with cancer. Community-based or online support groups. Where to find more information National Cancer Institute: www.cancer.gov American Cancer Society: www.cancer.org Contact a health care provider if: You bleed or bruise more often. You notice blood in your urine or stool. You have any of these symptoms: A skin rash, or dry or itchy skin. A headache or stiff neck. Cold or flu symptoms. A cough. Persistent nausea or vomiting. Persistent diarrhea. Frequent urination, burning when passing urine, or foul-smelling urine. You cannot eat  because of mouth or throat pain. You are sad, confused, anxious, or depressed. Get help right away if: You have any of these symptoms: A fever or chills. Your health care provider should know about this right away. Redness, swelling, pain, fluid, or warmth near an IV site. Bleeding that you cannot stop. A seizure. You cannot swallow. You have chest pain. You have trouble breathing. A family member or caregiver should get help right away if you have a sudden or unusual change in behavior. These symptoms may be an emergency. Get help right away. Call 911. Do not wait to see if the symptoms will go away. Do not drive yourself to the hospital. Summary Chemotherapy is a treatment that uses medicine to kill cancer cells and can cause side effects. The specific side effects depend on the specific medicines used. Learn as much as you can about your condition. Ask about side effects to watch for and how to treat them. Seek out support and resources from others. Find out what family support resources are available from your cancer treatment center. Let your health care provider know if you notice any new, unusual, or worsening symptoms, especially fever or chills. This information is not intended to replace advice given to you by your health care provider. Make sure you discuss any questions you have with your health   care provider. Document Revised: 08/24/2021 Document Reviewed: 08/24/2021 Elsevier Patient Education  2023 [employer]  

## 2023-01-22 ENCOUNTER — Telehealth: Payer: Self-pay

## 2023-01-22 ENCOUNTER — Other Ambulatory Visit: Payer: Self-pay | Admitting: Oncology

## 2023-01-22 DIAGNOSIS — C786 Secondary malignant neoplasm of retroperitoneum and peritoneum: Secondary | ICD-10-CM

## 2023-01-22 MED ORDER — OXYCODONE HCL 10 MG PO TABS: ORAL_TABLET | ORAL | 0 refills | Status: DC

## 2023-01-22 NOTE — Telephone Encounter (Signed)
Christina Dory RN spoke with patient and she is willing to try the Oxycodone 10mg .  Dr. Melvyn Neth given this information and will send in prescription.

## 2023-01-22 NOTE — Telephone Encounter (Signed)
Dr. Melvyn Neth wishes not to refill dilaudid, however will send in prescription of oxycodone 10mg .  I relayed this to [facility] and she is checking with patient.

## 2023-01-23 NOTE — Progress Notes (Signed)
Sent in request for DOS 01/30/2023 to [employer].

## 2023-01-27 NOTE — Progress Notes (Unsigned)
[facility]  [address] [place],  [place]  [address] [phone]  Clinic Day:  01/28/2023  Referring physician: Barbie Banner, MD  HISTORY OF PRESENT ILLNESS:  The patient is a 47 y.o. female  with metastatic pancreatic cancer, with spread of disease to her peritoneum, uterus and ovaries.  She comes in today to be evaluated to head into her 7th cycle of FOLFIRINOX.  The patient claims to have tolerated her 6th cycle of FOLFIRINOX fairly well. 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  She has occasional nausea/vomiting that is relieved with her oral antiemetics.  She is on Eliquis due to a small right-sided pulmonary embolus seen on February 2024 CT scans.  She denies having abdominal distention, pain, or other symptoms which concern her for overt signs of disease progression.   Her history dates back to January 2024 when she first began having lower quadrant abdominal pain.  Over a span of weeks, she began having abdominal swelling and distention.  She also began having increased constipation.  This constellation of symptoms led to her undergoing CT scans of her abdomen/pelvis, which reveal a 7.9 cm cystic pancreatic lesion, with an irregular stomach border.  There was extensive ascites, peritoneal carcinomatosis, bilateral adnexal masses and uterine involvement.  The patient underwent a paracentesis in early February 2024, which revealed malignant cells, consistent with adenocarcinoma.  She also underwent an endometrial biopsy, which revealed metastatic adenocarcinoma.  In particular, immunohistochemical stains were positive for CK7/CDX2/CK20.  PAX8 and ER staining was negative. This pattern was consistent with a malignancy from an upper GI/pancreaticobiliary origin.  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   PHYSICAL EXAM:  Blood pressure 121/86, pulse (!) 129, temperature 98.6 F  (37 C), temperature source Oral, resp. rate 18, height 4\' 11"  (1.499 m), weight 129 lb 1.6 oz (58.6 kg), last menstrual period 10/18/2022, unknown if currently breastfeeding. Wt Readings from Last 3 Encounters:  01/28/23 129 lb 1.6 oz (58.6 kg)  01/18/23 135 lb (61.2 kg)  01/16/23 136 lb (61.7 kg)   Body mass index is 26.08 kg/m. Performance status (ECOG): 1 Physical Exam Constitutional:      Appearance: Normal appearance.     Comments: She appears stronger and healthier vs previous visits  HENT:     Mouth/Throat:     Mouth: Mucous membranes are moist.     Pharynx: Oropharynx is clear. No oropharyngeal exudate or posterior oropharyngeal erythema.  Cardiovascular:     Rate and Rhythm: Normal rate and regular rhythm.     Heart sounds: No murmur heard.    No friction rub. No gallop.  Pulmonary:     Effort: Pulmonary effort is normal. No respiratory distress.     Breath sounds: Normal breath sounds. No wheezing, rhonchi or rales.  Abdominal:     General: Bowel sounds are normal. There is no distension.     Palpations: Abdomen is soft. There is no mass.     Tenderness: There is no abdominal tenderness.  Musculoskeletal:        General: No swelling.     Right lower leg: No edema.     Left lower leg: No edema.  Lymphadenopathy:     Cervical: No cervical adenopathy.     Upper Body:     Right upper body: No supraclavicular or axillary adenopathy.     Left upper body: No supraclavicular or axillary adenopathy.  Lower Body: No right inguinal adenopathy. No left inguinal adenopathy.  Skin:    General: Skin is warm.     Coloration: Skin is not jaundiced.     Findings: No lesion or rash.  Neurological:     General: No focal deficit present.     Mental Status: She is alert and oriented to person, place, and time. Mental status is at baseline.  Psychiatric:        Mood and Affect: Mood normal.        Behavior: Behavior normal.        Thought Content: Thought content normal.     LABS:      Latest Ref Rng & Units 01/14/2023   12:00 AM 12/31/2022   12:00 AM 12/17/2022   12:00 AM  CBC  WBC  4.6     5.0     2.3      Hemoglobin 12.0 - 16.0 9.9     9.8     8.9      Hematocrit 36 - 46 31     32     29      Platelets 150 - 400 K/uL 192     355     289         This result is from an external source.      Latest Ref Rng & Units 01/28/2023    8:33 AM 01/14/2023    9:10 AM 12/31/2022   12:00 AM  CMP  Glucose 70 - 99 mg/dL 604  540    BUN 6 - 20 mg/dL 6  11  10       Creatinine 0.44 - 1.00 mg/dL 9.81  1.91  0.4      Sodium 135 - 145 mmol/L 138  136  132      Potassium 3.5 - 5.1 mmol/L 3.3  2.5  3.3      Chloride 98 - 111 mmol/L 101  99  96      CO2 22 - 32 mmol/L 28  26  26       Calcium 8.9 - 10.3 mg/dL 8.8  8.5  8.7      Total Protein 6.5 - 8.1 g/dL 6.8  6.3    Total Bilirubin 0.3 - 1.2 mg/dL 0.3  0.3    Alkaline Phos 38 - 126 U/L 89  85  142      AST 15 - 41 U/L 21  17  19       ALT 0 - 44 U/L 26  21  22          This result is from an external source.   ASSESSMENT & PLAN:  A 47 y.o. female with metastatic pancreatic cancer. She will proceed with her 7th cycle of FOLFIRINOX this week.  Due to her increased neuropathy.  I will decrease her oxaliplatin dose by 25%.  It ultimately may get discontinued altogether if her neuropathy remains a prominent issue.  As she has questionable issues with dehydration, I will give her 1 Liter of fluids with her next cycle of chemotherapy to make her more euvolemic.  Overall, the patient continues to do very well. I will see her back in 2 weeks before she heads into her 8th cycle of FOLFIRINOX. The patient understands all the plans discussed today and is in agreement with them.   Caydence Enck Kirby Funk, MD

## 2023-01-28 ENCOUNTER — Inpatient Hospital Stay (INDEPENDENT_AMBULATORY_CARE_PROVIDER_SITE_OTHER): Payer: Medicaid Other | Admitting: Oncology

## 2023-01-28 ENCOUNTER — Encounter: Payer: Self-pay | Admitting: Oncology

## 2023-01-28 ENCOUNTER — Inpatient Hospital Stay: Payer: Medicaid Other | Admitting: Oncology

## 2023-01-28 DIAGNOSIS — C786 Secondary malignant neoplasm of retroperitoneum and peritoneum: Secondary | ICD-10-CM

## 2023-01-28 DIAGNOSIS — Z5111 Encounter for antineoplastic chemotherapy: Secondary | ICD-10-CM | POA: Diagnosis not present

## 2023-01-28 LAB — CMP (CANCER CENTER ONLY)
ALT: 26 U/L (ref 0–44)
AST: 21 U/L (ref 15–41)
Albumin: 2.9 g/dL — ABNORMAL LOW (ref 3.5–5.0)
Alkaline Phosphatase: 89 U/L (ref 38–126)
Anion gap: 9 (ref 5–15)
BUN: 6 mg/dL (ref 6–20)
CO2: 28 mmol/L (ref 22–32)
Calcium: 8.8 mg/dL — ABNORMAL LOW (ref 8.9–10.3)
Chloride: 101 mmol/L (ref 98–111)
Creatinine: 0.49 mg/dL (ref 0.44–1.00)
GFR, Estimated: >60 mL/min (ref >60)
Glucose, Bld: 130 mg/dL — ABNORMAL HIGH (ref 70–99)
Potassium: 3.3 mmol/L — ABNORMAL LOW (ref 3.5–5.1)
Sodium: 138 mmol/L (ref 135–145)
Total Bilirubin: 0.3 mg/dL (ref 0.3–1.2)
Total Protein: 6.8 g/dL (ref 6.5–8.1)

## 2023-01-28 LAB — CBC AND DIFFERENTIAL
HCT: 31 — AB (ref 36–46)
Hemoglobin: 10 — AB (ref 12.0–16.0)
Neutrophils Absolute: 1.32
Platelets: 166 10*3/uL (ref 150–400)
WBC: 3

## 2023-01-28 LAB — CBC W DIFFERENTIAL (~~LOC~~ CC SCANNED REPORT)

## 2023-01-28 LAB — CBC: RBC: 3.8 — AB (ref 3.87–5.11)

## 2023-01-29 ENCOUNTER — Encounter: Payer: Self-pay | Admitting: Oncology

## 2023-01-29 MED FILL — Dexamethasone Sodium Phosphate Inj 100 MG/10ML: INTRAMUSCULAR | Qty: 1 | Status: AC

## 2023-01-29 MED FILL — Oxaliplatin IV Soln 100 MG/20ML: INTRAVENOUS | Qty: 20 | Status: AC

## 2023-01-29 MED FILL — Fosaprepitant Dimeglumine For IV Infusion 150 MG (Base Eq): INTRAVENOUS | Qty: 5 | Status: AC

## 2023-01-29 MED FILL — Leucovorin Calcium For Inj 350 MG: INTRAMUSCULAR | Qty: 31.8 | Status: AC

## 2023-01-30 ENCOUNTER — Inpatient Hospital Stay: Payer: Medicaid Other

## 2023-01-30 DIAGNOSIS — Z5111 Encounter for antineoplastic chemotherapy: Secondary | ICD-10-CM | POA: Diagnosis not present

## 2023-01-30 DIAGNOSIS — C786 Secondary malignant neoplasm of retroperitoneum and peritoneum: Secondary | ICD-10-CM

## 2023-01-30 LAB — HCG, SERUM, QUALITATIVE: hCG Qual: NEGATIVE

## 2023-01-30 MED ORDER — IRINOTECAN HCL CHEMO INJECTION 100 MG/5ML
150.0000 mg/m2 | Freq: Once | INTRAVENOUS | Status: AC
  Administered 2023-01-30: 240 mg via INTRAVENOUS
  Filled 2023-01-30: qty 10

## 2023-01-30 MED ORDER — DEXTROSE 5 % IV SOLN: Freq: Once | INTRAVENOUS | Status: AC

## 2023-01-30 MED ORDER — FAMOTIDINE IN NACL 20-0.9 MG/50ML-% IV SOLN
20.0000 mg | Freq: Once | INTRAVENOUS | Status: AC
  Administered 2023-01-30: 20 mg via INTRAVENOUS
  Filled 2023-01-30: qty 50

## 2023-01-30 MED ORDER — LEUCOVORIN CALCIUM INJECTION 350 MG
400.0000 mg/m2 | Freq: Once | INTRAVENOUS | Status: AC
  Administered 2023-01-30: 636 mg via INTRAVENOUS
  Filled 2023-01-30: qty 31.8

## 2023-01-30 MED ORDER — SODIUM CHLORIDE 0.9 % IV SOLN
10.0000 mg | Freq: Once | INTRAVENOUS | Status: AC
  Administered 2023-01-30: 10 mg via INTRAVENOUS
  Filled 2023-01-30: qty 10

## 2023-01-30 MED ORDER — OXALIPLATIN CHEMO INJECTION 100 MG/20ML
63.7500 mg/m2 | Freq: Once | INTRAVENOUS | Status: AC
  Administered 2023-01-30: 100 mg via INTRAVENOUS
  Filled 2023-01-30: qty 20

## 2023-01-30 MED ORDER — POTASSIUM CHLORIDE IN NACL 20-0.9 MEQ/L-% IV SOLN
Freq: Once | INTRAVENOUS | Status: AC
  Filled 2023-01-30: qty 1000

## 2023-01-30 MED ORDER — SODIUM CHLORIDE 0.9 % IV SOLN
2425.0000 mg/m2 | INTRAVENOUS | Status: DC
  Administered 2023-01-30: 3850 mg via INTRAVENOUS
  Filled 2023-01-30: qty 77

## 2023-01-30 MED ORDER — SODIUM CHLORIDE 0.9 % IV SOLN
150.0000 mg | Freq: Once | INTRAVENOUS | Status: AC
  Administered 2023-01-30: 150 mg via INTRAVENOUS
  Filled 2023-01-30: qty 150

## 2023-01-30 MED ORDER — PALONOSETRON HCL INJECTION 0.25 MG/5ML
0.2500 mg | Freq: Once | INTRAVENOUS | Status: AC
  Administered 2023-01-30: 0.25 mg via INTRAVENOUS
  Filled 2023-01-30: qty 5

## 2023-01-30 NOTE — Patient Instructions (Addendum)
Managing Chemotherapy Side Effects, Adult Chemotherapy is a treatment that uses medicine to kill cancer cells. However, in addition to killing cancer cells, the medicines can also damage healthy cells. The damage to healthy cells can lead to side effects. The exact side effects depend on the specific medicines used. Most of the side effects of chemotherapy go away once treatment is finished. Until then, work closely with your health care providers and take an active role in managing your side effects. What are common side effects of chemotherapy? Increased risk of infection, bruising, or bleeding. Nausea and vomiting. Constipation or diarrhea. Loss of appetite. Hair loss. Mouth or throat sores. Tiredness (fatigue). Tingling, pain, or numbness in the hands and feet. Dry, sensitive, itchy, or sore skin. Sleep disturbances, such as excessive sleepiness. Confusion, anxiety, or mood swings. Memory changes. How to manage the side effects of chemotherapy Medicines Take over-the-counter and prescription medicines only as told by your health care provider. Talk with your health care provider before taking vitamins, herbs, supplements, or over-the-counter medicines. Some of these can interfere with chemotherapy. Activity Get plenty of rest. Get regular exercise by doing activities such as walking, gentle yoga, or tai chi. Return to your normal activities as told by your health care provider. Ask your health care provider what activities are safe for you. Eating and drinking  Talk to a dietitian about what you should eat and drink during cancer treatment. Drink enough fluid to keep your urine pale yellow. If you have side effects that affect eating, these tips may help: Eat smaller meals and snacks often. Drink high-nutrition and high-calorie shakes or supplements. Choose bland and soft foods that are easy to eat. Do not eat foods that are hot, spicy, or hard to swallow. Do not eat raw or  undercooked meat, eggs, or seafood. Always wash fresh fruits and vegetables well before eating them. Skin care If you have sore or itchy skin: Wear soft, comfortable clothing. Apply creams and ointments to your skin as told by your health care provider. If you lose your hair, consider wearing a wig, hat, or scarf to cover your head. You may want to have someone shave your head as you start to lose hair. During outdoor activities, protect your head and skin from the sun by using sunscreen with an SPF of 30 or higher or by wearing protective clothing and a hat. Meet with a hair and skin care specialist for makeup and skin care tips. Apply sunscreen to your scalp as told by your health care provider. General tips Learn as much as you can about your condition. If you are struggling emotionally, talk with a mental health care provider or join a support group. Keep all follow-up visits. This is important. How to prevent infection and bleeding Chemotherapy may lower your blood counts and put you at risk for infection and bleeding. Here are some ways to help prevent problems. Vaccines Talk to your health care provider about vaccines. You should not get any live vaccines, such as the polio, MMR, chickenpox, and shingles vaccines until your health care provider says that it is safe to do so. Do not be around people who have had live vaccines for as long as your health care provider recommends. Make sure you get a yearly flu shot. People who will be near you should also get a yearly flu shot. Social activity Stay away from crowded places where you could be exposed to germs. Do not be around people who may be sick or   people who have symptoms of a fever until they have been fever-free for at least 24 hours. Do not share food, cups, straws, or utensils with other people. Wear a mask when outside the home if your blood counts are low. Cleanliness  Wash your hands often for at least 20 seconds. Also make  sure that other members of your household wash their hands often. Brush your teeth twice daily using a soft toothbrush. Use mouth rinse only as told by your health care provider. Take a bath or shower daily unless your health care provider gives different instructions. General tips Take your temperature regularly, especially if you have chills or feel warm. Check with your health care provider: Before you travel. Before you have a dental procedure. Before you use a swimming pool, hot tub, or swim in a lake or ocean. If you get chemotherapy through an IV or port, check the site every day for signs of infection. Check for redness, swelling, pain, fluid, and warmth. Avoid activities that put you at risk for injury or bleeding. Use an electric razor to shave instead of a blade. Questions to ask your health care provider What are the most common side effects of my treatment? How will they affect my daily life? What can I do to manage them? What are some possible long-term side effects? What are possible complications? What support services are available? What number can I call with questions or concerns? Where to find support Cancer affects the entire family. Find out what family support resources are available from your cancer treatment center. For more support, turn to: Your cancer care team. Friends and family. Your religious community. Other people with cancer. Community-based or online support groups. Where to find more information [place]: www.cancer.gov American Cancer Society: www.cancer.org Contact a health care provider if: You bleed or bruise more often. You notice blood in your urine or stool. You have any of these symptoms: A skin rash, or dry or itchy skin. A headache or stiff neck. Cold or flu symptoms. A cough. Persistent nausea or vomiting. Persistent diarrhea. Frequent urination, burning when passing urine, or foul-smelling urine. You cannot eat  because of mouth or throat pain. You are sad, confused, anxious, or depressed. Get help right away if: You have any of these symptoms: A fever or chills. Your health care provider should know about this right away. Redness, swelling, pain, fluid, or warmth near an IV site. Bleeding that you cannot stop. A seizure. You cannot swallow. You have chest pain. You have trouble breathing. A family member or caregiver should get help right away if you have a sudden or unusual change in behavior. These symptoms may be an emergency. Get help right away. Call 911. Do not wait to see if the symptoms will go away. Do not drive yourself to the hospital. Summary Chemotherapy is a treatment that uses medicine to kill cancer cells and can cause side effects. The specific side effects depend on the specific medicines used. Learn as much as you can about your condition. Ask about side effects to watch for and how to treat them. Seek out support and resources from others. Find out what family support resources are available from your cancer treatment center. Let your health care provider know if you notice any new, unusual, or worsening symptoms, especially fever or chills. This information is not intended to replace advice given to you by your health care provider. Make sure you discuss any questions you have with your health  care provider. Document Revised: 08/24/2021 Document Reviewed: 08/24/2021 Elsevier Patient Education  2023 Elsevier Inc.Hypokalemia Hypokalemia means that the amount of potassium in the blood is lower than normal. Potassium is a mineral (electrolyte) that helps regulate the amount of fluid in the body. It also stimulates muscle tightening (contraction) and helps nerves work properly. Normally, most of the body's potassium is inside cells, and only a very small amount is in the blood. Because the amount in the blood is so small, minor changes to potassium levels in the blood can be  life-threatening. What are the causes? This condition may be caused by: Antibiotic medicine. Diarrhea or vomiting. Taking too much of a medicine that helps you have a bowel movement (laxative) can cause diarrhea and lead to hypokalemia. Chronic kidney disease (CKD). Medicines that help the body get rid of excess fluid (diuretics). Eating disorders, such as anorexia or bulimia. Low magnesium levels in the body. Sweating a lot. What are the signs or symptoms? Symptoms of this condition include: Weakness. Constipation. Fatigue. Muscle cramps. Mental confusion. Skipped heartbeats or irregular heartbeat (palpitations). Tingling or numbness. How is this diagnosed? This condition is diagnosed with a blood test. How is this treated? This condition may be treated by: Taking potassium supplements. Adjusting the medicines that you take. Eating more foods that contain a lot of potassium. If your potassium level is very low, you may need to get potassium through an IV and be monitored in the hospital. Follow these instructions at home: Eating and drinking  Eat a healthy diet. A healthy diet includes fresh fruits and vegetables, whole grains, healthy fats, and lean proteins. If told, eat more foods that contain a lot of potassium. These include: Nuts, such as peanuts and pistachios. Seeds, such as sunflower seeds and pumpkin seeds. Peas, lentils, and lima beans. Whole grain and bran cereals and breads. Fresh fruits and vegetables, such as apricots, avocado, bananas, cantaloupe, kiwi, oranges, tomatoes, asparagus, and potatoes. Juices, such as orange, tomato, and prune. Lean meats, including fish. Milk and milk products, such as yogurt. General instructions Take over-the-counter and prescription medicines only as told by your health care provider. This includes vitamins, natural food products, and supplements. Keep all follow-up visits. This is important. Contact a health care provider  if: You have weakness that gets worse. You feel your heart pounding or racing. You vomit. You have diarrhea. You have diabetes and you have trouble keeping your blood sugar in your target range. Get help right away if: You have chest pain. You have shortness of breath. You have vomiting or diarrhea that lasts for more than 2 days. You faint. These symptoms may be an emergency. Get help right away. Call 911. Do not wait to see if the symptoms will go away. Do not drive yourself to the hospital. Summary Hypokalemia means that the amount of potassium in the blood is lower than normal. This condition is diagnosed with a blood test. Hypokalemia may be treated by taking potassium supplements, adjusting the medicines that you take, or eating more foods that are high in potassium. If your potassium level is very low, you may need to get potassium through an IV and be monitored in the hospital. This information is not intended to replace advice given to you by your health care provider. Make sure you discuss any questions you have with your health care provider. Document Revised: 05/18/2021 Document Reviewed: 05/18/2021 Elsevier Patient Education  2023 [employer].

## 2023-01-31 NOTE — Progress Notes (Signed)
error 

## 2023-02-01 ENCOUNTER — Inpatient Hospital Stay: Payer: Medicaid Other

## 2023-02-01 VITALS — BP 124/93 | HR 80 | Temp 97.8°F | Resp 18 | Ht 59.0 in | Wt 130.5 lb

## 2023-02-01 DIAGNOSIS — Z5111 Encounter for antineoplastic chemotherapy: Secondary | ICD-10-CM | POA: Diagnosis not present

## 2023-02-01 DIAGNOSIS — C786 Secondary malignant neoplasm of retroperitoneum and peritoneum: Secondary | ICD-10-CM

## 2023-02-01 MED ORDER — HEPARIN SOD (PORK) LOCK FLUSH 100 UNIT/ML IV SOLN
500.0000 [IU] | Freq: Once | INTRAVENOUS | Status: AC | PRN
  Administered 2023-02-01: 500 [IU]

## 2023-02-01 MED ORDER — OLANZAPINE 5 MG PO TBDP
5.0000 mg | ORAL_TABLET | ORAL | 3 refills | Status: AC
Start: 2023-02-01 — End: 2023-03-03

## 2023-02-01 MED ORDER — SODIUM CHLORIDE 0.9% FLUSH
10.0000 mL | INTRAVENOUS | Status: DC | PRN
  Administered 2023-02-01: 10 mL

## 2023-02-01 NOTE — Progress Notes (Signed)
1445 PATIENT HERE TODAY SHE C/O NAUSEA AND VOMITING X 1. SHE TOLD COMPAZINE SHE STATED IT DID NOT HELP. INFORMED PHARMACIST SUSAN PHY. WE WILL CHANGE THE COMPAZINE TO ZYPREXA. THE PATIENT STATED THAT THE ZYPREXA WORK WELL IN THE PAST. SUSAN EXPLAINED WHEN AND HOW TO USE THE ZYPREXA. PATIENT VERBALIZED UNDERSTANDING.

## 2023-02-01 NOTE — Patient Instructions (Signed)
The chemotherapy medication bag should finish at 46 hours, 96 hours, or 7 days. For example, if your pump is scheduled for 46 hours and it was put on at 4:00 p.m., it should finish at 2:00 p.m. the day it is scheduled to come off regardless of your appointment time.     Estimated time to finish at 1452.   If the display on your pump reads "Low Volume" and it is beeping, take the batteries out of the pump and come to the cancer center for it to be taken off.   If the pump alarms go off prior to the pump reading "Low Volume" then call [phone] and someone can assist you.  If the plunger comes out and the chemotherapy medication is leaking out, please use your home chemo spill kit to clean up the spill. Do NOT use paper towels or other household products.  If you have problems or questions regarding your pump, please call either [phone] (24 hours a day) or the cancer center Monday-Friday 8:00 a.m.- 4:30 p.m. at the clinic number and we will assist you. If you are unable to get assistance, then go to the nearest Emergency Department and ask the staff to contact the IV team for assistance.

## 2023-02-04 ENCOUNTER — Encounter: Payer: Self-pay | Admitting: Oncology

## 2023-02-04 ENCOUNTER — Telehealth: Payer: Self-pay | Admitting: Oncology

## 2023-02-04 ENCOUNTER — Inpatient Hospital Stay: Payer: Medicaid Other

## 2023-02-04 VITALS — BP 156/94 | HR 80 | Temp 98.4°F | Resp 18 | Ht 59.0 in | Wt 119.1 lb

## 2023-02-04 DIAGNOSIS — Z5111 Encounter for antineoplastic chemotherapy: Secondary | ICD-10-CM | POA: Diagnosis not present

## 2023-02-04 DIAGNOSIS — C786 Secondary malignant neoplasm of retroperitoneum and peritoneum: Secondary | ICD-10-CM

## 2023-02-04 MED ORDER — FILGRASTIM-SNDZ 480 MCG/0.8ML IJ SOSY
480.0000 ug | PREFILLED_SYRINGE | Freq: Once | INTRAMUSCULAR | Status: AC
  Administered 2023-02-04: 480 ug via SUBCUTANEOUS
  Filled 2023-02-04: qty 0.8

## 2023-02-04 MED ORDER — ONDANSETRON HCL 4 MG/2ML IJ SOLN
8.0000 mg | Freq: Once | INTRAMUSCULAR | Status: AC
  Administered 2023-02-04: 8 mg via INTRAVENOUS
  Filled 2023-02-04: qty 4

## 2023-02-04 MED ORDER — SODIUM CHLORIDE 0.9 % IV SOLN: Freq: Once | INTRAVENOUS | Status: AC

## 2023-02-04 NOTE — Telephone Encounter (Signed)
02/04/2023  Patient did not know she had Injection today - trying to find a ride here to her Appt

## 2023-02-04 NOTE — Telephone Encounter (Signed)
02/04/2023  Patient is on her way for her Appt today

## 2023-02-05 ENCOUNTER — Other Ambulatory Visit: Payer: Self-pay

## 2023-02-05 ENCOUNTER — Inpatient Hospital Stay: Payer: Medicaid Other

## 2023-02-05 DIAGNOSIS — Z5111 Encounter for antineoplastic chemotherapy: Secondary | ICD-10-CM | POA: Diagnosis not present

## 2023-02-05 DIAGNOSIS — C786 Secondary malignant neoplasm of retroperitoneum and peritoneum: Secondary | ICD-10-CM

## 2023-02-05 MED ORDER — FILGRASTIM-SNDZ 480 MCG/0.8ML IJ SOSY
480.0000 ug | PREFILLED_SYRINGE | Freq: Once | INTRAMUSCULAR | Status: AC
  Administered 2023-02-05: 480 ug via SUBCUTANEOUS
  Filled 2023-02-05: qty 0.8

## 2023-02-08 ENCOUNTER — Inpatient Hospital Stay: Payer: Medicaid Other

## 2023-02-08 ENCOUNTER — Telehealth: Payer: Self-pay

## 2023-02-08 ENCOUNTER — Encounter: Payer: Self-pay | Admitting: Oncology

## 2023-02-08 ENCOUNTER — Encounter: Payer: Self-pay | Admitting: Hematology and Oncology

## 2023-02-08 ENCOUNTER — Inpatient Hospital Stay (INDEPENDENT_AMBULATORY_CARE_PROVIDER_SITE_OTHER): Payer: Medicaid Other | Admitting: Hematology and Oncology

## 2023-02-08 ENCOUNTER — Telehealth: Payer: Self-pay | Admitting: Oncology

## 2023-02-08 DIAGNOSIS — D500 Iron deficiency anemia secondary to blood loss (chronic): Secondary | ICD-10-CM

## 2023-02-08 DIAGNOSIS — E876 Hypokalemia: Secondary | ICD-10-CM

## 2023-02-08 DIAGNOSIS — C786 Secondary malignant neoplasm of retroperitoneum and peritoneum: Secondary | ICD-10-CM

## 2023-02-08 DIAGNOSIS — Z5111 Encounter for antineoplastic chemotherapy: Secondary | ICD-10-CM | POA: Diagnosis not present

## 2023-02-08 LAB — CMP (CANCER CENTER ONLY)
ALT: 21 U/L (ref 0–44)
AST: 18 U/L (ref 15–41)
Albumin: 3.2 g/dL — ABNORMAL LOW (ref 3.5–5.0)
Alkaline Phosphatase: 107 U/L (ref 38–126)
Anion gap: 9 (ref 5–15)
BUN: 9 mg/dL (ref 6–20)
CO2: 29 mmol/L (ref 22–32)
Calcium: 8.7 mg/dL — ABNORMAL LOW (ref 8.9–10.3)
Chloride: 97 mmol/L — ABNORMAL LOW (ref 98–111)
Creatinine: 0.51 mg/dL (ref 0.44–1.00)
GFR, Estimated: >60 mL/min (ref >60)
Glucose, Bld: 135 mg/dL — ABNORMAL HIGH (ref 70–99)
Potassium: 3.2 mmol/L — ABNORMAL LOW (ref 3.5–5.1)
Sodium: 135 mmol/L (ref 135–145)
Total Bilirubin: 0.3 mg/dL (ref 0.3–1.2)
Total Protein: 6.3 g/dL — ABNORMAL LOW (ref 6.5–8.1)

## 2023-02-08 LAB — HEPATIC FUNCTION PANEL
ALT: 25 U/L (ref 7–35)
AST: 27 (ref 13–35)
Alkaline Phosphatase: 110 (ref 25–125)
Bilirubin, Total: 0.2

## 2023-02-08 LAB — IRON AND TIBC
Iron: 30 ug/dL (ref 28–170)
Saturation Ratios: 12 % (ref 10.4–31.8)
TIBC: 259 ug/dL (ref 250–450)
UIBC: 229 ug/dL

## 2023-02-08 LAB — BASIC METABOLIC PANEL
BUN: 8 (ref 4–21)
CO2: 28 — AB (ref 13–22)
Chloride: 96 — AB (ref 99–108)
Creatinine: 0.4 — AB (ref 0.5–1.1)
Glucose: 135
Potassium: 3.1 mEq/L — AB (ref 3.5–5.1)
Sodium: 136 — AB (ref 137–147)

## 2023-02-08 LAB — CBC AND DIFFERENTIAL
HCT: 33 — AB (ref 36–46)
Hemoglobin: 10.6 — AB (ref 12.0–16.0)
Neutrophils Absolute: 2.41
Platelets: 132 10*3/uL — AB (ref 150–400)
WBC: 4.3

## 2023-02-08 LAB — CBC: RBC: 3.94 (ref 3.87–5.11)

## 2023-02-08 LAB — COMPREHENSIVE METABOLIC PANEL
Albumin: 3.6 (ref 3.5–5.0)
Calcium: 9.5 (ref 8.7–10.7)

## 2023-02-08 LAB — HCG, SERUM, QUALITATIVE: Preg, Serum: NEGATIVE

## 2023-02-08 LAB — FERRITIN: Ferritin: 985 ng/mL — ABNORMAL HIGH (ref 11–307)

## 2023-02-08 MED ORDER — POTASSIUM CHLORIDE CRYS ER 20 MEQ PO TBCR
20.0000 meq | EXTENDED_RELEASE_TABLET | Freq: Every day | ORAL | 0 refills | Status: AC
Start: 2023-02-08 — End: 2023-02-13

## 2023-02-08 NOTE — Telephone Encounter (Signed)
Patient notified and voiced understanding.

## 2023-02-08 NOTE — Telephone Encounter (Signed)
-----   Message from Adah Perl, PA-C sent at 02/08/2023 11:47 AM EDT ----- Please let her know her potassium is still low. I sent in another prescription for potassium. Thanks

## 2023-02-08 NOTE — Telephone Encounter (Signed)
Patient has been scheduled. Aware of appt date and time   Scheduling Message Entered by Belva Crome A on 02/08/2023 at  9:35 AM Priority: High <No visit type provided>  Department: [facility]  Provider:  Scheduling Notes:  1. Cancel chemo/pump d/c next week  2. Labs and f/u with Dr. Melvyn Neth in 2 weeks to resume chemo

## 2023-02-08 NOTE — Progress Notes (Cosign Needed)
[facility]  [address] [place],  [place]  [address] [phone]  Clinic Day:  02/08/2023  Referring physician: Barbie Banner, MD   HISTORY OF PRESENT ILLNESS:  The patient is a 47 y.o. female with metastatic pancreatic cancer, with spread of disease to her peritoneum, uterus and ovaries.  She comes in today to be evaluated prior to her 8th cycle of FOLFIRINOX. Due to neuropathy, the oxaliplatin dose was reduced by 25% with her 7th cycle. She also required IV potassium and fluids with her 7th cycle. The patient claims to have had severe nausea and vomiting with her 7th cycle of FOLFIRINOX.  She was given olanzapine 5 mg at bedtime for 5 days with improvement in her nausea and vomiting.  She did not take olanzapine last night and had recurrent nausea and vomiting early this morning.  She also now has diarrhea.  She reports chills and sweats, have been chronic.  She denies fever.  She continues to complain of severe cold sensitivity.  She also has had restless arms and legs.  She would like to skip next week's treatment and reschedule in 2 weeks.  She continues Eliquis due to a small right-sided pulmonary embolus seen on February 2024 CT scans.  She denies having increased abdominal distention, pain, or other symptoms which concern her for overt signs of disease progression.    Her history dates back to January 2024 when she first began having lower quadrant abdominal pain.  Over a span of weeks, she began having abdominal swelling and distention.  She also began having increased constipation.  This constellation of symptoms led to her undergoing CT scans of her abdomen/pelvis, which reveal a 7.9 cm cystic pancreatic lesion, with an irregular stomach border.  There was extensive ascites, peritoneal carcinomatosis, bilateral adnexal masses and uterine involvement.  The patient underwent a paracentesis in early February 2024, which revealed malignant cells, consistent  with adenocarcinoma.  She also underwent an endometrial biopsy, which revealed metastatic adenocarcinoma.  In particular, immunohistochemical stains were positive for CK7/CDX2/CK20.  PAX8 and ER staining was negative. This pattern was consistent with a malignancy from an upper GI/pancreaticobiliary origin.  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   PHYSICAL EXAM:  Blood pressure (!) 168/97, pulse 76, temperature 98.5 F (36.9 C), temperature source Oral, resp. rate 20, height 4\' 11"  (1.499 m), weight 121 lb 11.2 oz (55.2 kg), last menstrual period 10/18/2022, SpO2 99 %, unknown if currently breastfeeding. Wt Readings from Last 3 Encounters:  02/08/23 121 lb 11.2 oz (55.2 kg)  02/04/23 119 lb 1.9 oz (54 kg)  02/01/23 130 lb 8 oz (59.2 kg)   Body mass index is 24.58 kg/m.  Performance status (ECOG): 3 - Symptomatic, >50% confined to bed  Physical Exam Vitals and nursing note reviewed.  Constitutional:      General: She is not in acute distress.    Appearance: Normal appearance.  HENT:     Head: Normocephalic and atraumatic.     Mouth/Throat:     Mouth: Mucous membranes are moist.     Pharynx: Oropharynx is clear. No oropharyngeal exudate or posterior oropharyngeal erythema.  Eyes:     General: No scleral icterus.    Extraocular Movements: Extraocular movements intact.     Conjunctiva/sclera: Conjunctivae normal.     Pupils: Pupils are equal, round, and reactive to light.  Cardiovascular:     Rate and Rhythm: Normal rate and regular rhythm.  Heart sounds: Normal heart sounds. No murmur heard.    No friction rub. No gallop.  Pulmonary:     Effort: Pulmonary effort is normal.     Breath sounds: Normal breath sounds. No wheezing, rhonchi or rales.  Abdominal:     General: Bowel sounds are decreased. There is distension (Abdomen is mildly distended did, but soft).     Palpations: Abdomen is soft. There is no hepatomegaly, splenomegaly or mass.      Tenderness: There is no abdominal tenderness.  Musculoskeletal:        General: Normal range of motion.     Cervical back: Normal range of motion and neck supple. No tenderness.     Right lower leg: No edema.     Left lower leg: No edema.  Lymphadenopathy:     Cervical: No cervical adenopathy.     Upper Body:     Right upper body: No supraclavicular or axillary adenopathy.     Left upper body: No supraclavicular or axillary adenopathy.     Lower Body: No right inguinal adenopathy. No left inguinal adenopathy.  Skin:    General: Skin is warm and dry.     Coloration: Skin is not jaundiced.     Findings: No rash.  Neurological:     Mental Status: She is alert and oriented to person, place, and time.     Cranial Nerves: No cranial nerve deficit.  Psychiatric:        Mood and Affect: Mood normal.        Behavior: Behavior normal.        Thought Content: Thought content normal.     LABS:      Latest Ref Rng & Units 02/08/2023   12:00 AM 01/28/2023   12:00 AM 01/14/2023   12:00 AM  CBC  WBC  4.3     3.0     4.6      Hemoglobin 12.0 - 16.0 10.6     10.0     9.9      Hematocrit 36 - 46 33     31     31      Platelets 150 - 400 K/uL 132     166     192         This result is from an external source.      Latest Ref Rng & Units 02/08/2023   12:00 AM 01/28/2023    8:33 AM 01/14/2023    9:10 AM  CMP  Glucose 70 - 99 mg/dL  161  096   BUN 4 - 21 8     6  11    Creatinine 0.5 - 1.1 0.4     0.49  0.56   Sodium 137 - 147 136     138  136   Potassium 3.5 - 5.1 mEq/L 3.1     3.3  2.5   Chloride 99 - 108 96     101  99   CO2 13 - 22 28     28  26    Calcium 8.7 - 10.7 9.5     8.8  8.5   Total Protein 6.5 - 8.1 g/dL  6.8  6.3   Total Bilirubin 0.3 - 1.2 mg/dL  0.3  0.3   Alkaline Phos 25 - 125 110     89  85   AST 13 - 35 27     21  17    ALT 7 - 35 U/L  25     26  21       This result is from an external source.     Lab Results  Component Value Date   CEA1 84.0 (H) 10/19/2022    /  CEA  Date Value Ref Range Status  10/19/2022 84.0 (H) 0.0 - 4.7 ng/mL Final    Comment:    (NOTE)                             Nonsmokers          <3.9                             Smokers             <5.6 [employer] Electrochemiluminescence Immunoassay (ECLIA) Values obtained with different assay methods or kits cannot be used interchangeably.  Results cannot be interpreted as absolute evidence of the presence or absence of malignant disease. Performed At: [facility] [address] [place], [place] [id number] Jolene Schimke MD WU:[id number]      Lab Results  Component Value Date   NFA213 25 10/19/2022   Lab Results  Component Value Date   CAN125 314.0 (H) 10/19/2022     Lab Results  Component Value Date   TIBC 255 10/20/2022   FERRITIN 42 10/20/2022   IRONPCTSAT 6 (L) 10/20/2022       Component Value Date/Time   CEA1 84.0 (H) 10/19/2022 1653    Review Flowsheet       Latest Ref Rng & Units 10/19/2022 10/20/2022  Oncology Labs  CA 19-9 0 - 35 U/mL 25  -  Cancer Antigen (CA) 125 0.0 - 38.1 U/mL 314.0  -  Ferritin 11 - 307 ng/mL - 42   %SAT 10.4 - 31.8 % - 6   CEA 0.0 - 4.7 ng/mL 84.0  -     STUDIES:  No results found.    ASSESSMENT & PLAN:   Assessment/Plan:  47 y.o. female with metastatic pancreatic cancer.  She is struggling with the side effects of treatment, so we will hold her 8th cycle of FOLFIRINOX for 2 weeks at her request.  We will plan to see her again at that time to hopefully resume treatment.  I offered her IV fluids today, but she declined.  She has persistent hypokalemia, so I will send in another 5-day course of potassium supplement.  The patient understands all the plans discussed today and is in agreement with them.  She knows to contact our office if she develops concerns prior to her next appointment.     Adah Perl, PA-C   Physician Assistant [facility] [phone]

## 2023-02-09 ENCOUNTER — Other Ambulatory Visit: Payer: Self-pay

## 2023-02-13 ENCOUNTER — Ambulatory Visit: Payer: Medicaid Other

## 2023-02-13 ENCOUNTER — Encounter: Payer: Self-pay | Admitting: Oncology

## 2023-02-13 ENCOUNTER — Telehealth: Payer: Self-pay | Admitting: Dietician

## 2023-02-13 ENCOUNTER — Encounter: Payer: Medicaid Other | Admitting: Dietician

## 2023-02-13 NOTE — Telephone Encounter (Signed)
Attempted to reach patient for a scheduled remote nutrition consult. Provided my cell# on voice mail to return call for follow up nutrition consult.  It was originally planned to occur during er infusion, but infusion was postponed.  Gennaro Africa, RDN, LDN Registered Dietitian, [facility] Part Time Remote (Usual office hours: Tuesday-Thursday) Cell: [phone]

## 2023-02-18 ENCOUNTER — Ambulatory Visit: Payer: Medicaid Other

## 2023-02-19 ENCOUNTER — Ambulatory Visit: Payer: Medicaid Other

## 2023-02-22 ENCOUNTER — Other Ambulatory Visit: Payer: Self-pay | Admitting: Oncology

## 2023-02-22 DIAGNOSIS — G893 Neoplasm related pain (acute) (chronic): Secondary | ICD-10-CM

## 2023-02-22 DIAGNOSIS — C786 Secondary malignant neoplasm of retroperitoneum and peritoneum: Secondary | ICD-10-CM

## 2023-02-22 MED ORDER — FENTANYL 75 MCG/HR TD PT72
1.0000 | MEDICATED_PATCH | TRANSDERMAL | 0 refills | Status: AC
Start: 2023-02-22 — End: ?

## 2023-02-24 NOTE — Progress Notes (Signed)
[facility]  [address] [place],  [place]  [address] [phone]  Clinic Day:  01/28/2023  Referring physician: Barbie Banner, MD  HISTORY OF PRESENT ILLNESS:  The patient is a 47 y.o. female  with metastatic pancreatic cancer, with spread of disease to her peritoneum, uterus and ovaries.  She comes in today to be evaluated to head into her 9th cycle of FOLFIRINOX.  The patient claims to have tolerated her 6th cycle of FOLFIRINOX fairly well. 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  She has occasional nausea/vomiting that is relieved with her oral antiemetics.  She is on Eliquis due to a small right-sided pulmonary embolus seen on February 2024 CT scans.  She denies having abdominal distention, pain, or other symptoms which concern her for overt signs of disease progression.   Her history dates back to January 2024 when she first began having lower quadrant abdominal pain.  Over a span of weeks, she began having abdominal swelling and distention.  She also began having increased constipation.  This constellation of symptoms led to her undergoing CT scans of her abdomen/pelvis, which reveal a 7.9 cm cystic pancreatic lesion, with an irregular stomach border.  There was extensive ascites, peritoneal carcinomatosis, bilateral adnexal masses and uterine involvement.  The patient underwent a paracentesis in early February 2024, which revealed malignant cells, consistent with adenocarcinoma.  She also underwent an endometrial biopsy, which revealed metastatic adenocarcinoma.  In particular, immunohistochemical stains were positive for CK7/CDX2/CK20.  PAX8 and ER staining was negative. This pattern was consistent with a malignancy from an upper GI/pancreaticobiliary origin.  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   PHYSICAL EXAM:  Last menstrual period 10/18/2022, unknown if currently  breastfeeding. Wt Readings from Last 3 Encounters:  02/08/23 121 lb 11.2 oz (55.2 kg)  02/04/23 119 lb 1.9 oz (54 kg)  02/01/23 130 lb 8 oz (59.2 kg)   There is no height or weight on file to calculate BMI. Performance status (ECOG): 1 Physical Exam Constitutional:      Appearance: Normal appearance.     Comments: She appears stronger and healthier vs previous visits  HENT:     Mouth/Throat:     Mouth: Mucous membranes are moist.     Pharynx: Oropharynx is clear. No oropharyngeal exudate or posterior oropharyngeal erythema.  Cardiovascular:     Rate and Rhythm: Normal rate and regular rhythm.     Heart sounds: No murmur heard.    No friction rub. No gallop.  Pulmonary:     Effort: Pulmonary effort is normal. No respiratory distress.     Breath sounds: Normal breath sounds. No wheezing, rhonchi or rales.  Abdominal:     General: Bowel sounds are normal. There is no distension.     Palpations: Abdomen is soft. There is no mass.     Tenderness: There is no abdominal tenderness.  Musculoskeletal:        General: No swelling.     Right lower leg: No edema.     Left lower leg: No edema.  Lymphadenopathy:     Cervical: No cervical adenopathy.     Upper Body:     Right upper body: No supraclavicular or axillary adenopathy.     Left upper body: No supraclavicular or axillary adenopathy.     Lower Body: No right inguinal adenopathy. No left inguinal adenopathy.  Skin:    General: Skin is warm.  Coloration: Skin is not jaundiced.     Findings: No lesion or rash.  Neurological:     General: No focal deficit present.     Mental Status: She is alert and oriented to person, place, and time. Mental status is at baseline.  Psychiatric:        Mood and Affect: Mood normal.        Behavior: Behavior normal.        Thought Content: Thought content normal.    LABS:      Latest Ref Rng & Units 02/08/2023   12:00 AM 01/28/2023   12:00 AM 01/14/2023   12:00 AM  CBC  WBC  4.3     3.0      4.6      Hemoglobin 12.0 - 16.0 10.6     10.0     9.9      Hematocrit 36 - 46 33     31     31      Platelets 150 - 400 K/uL 132     166     192         This result is from an external source.       Latest Ref Rng & Units 02/08/2023    8:30 AM 02/08/2023   12:00 AM 01/28/2023    8:33 AM  CMP  Glucose 70 - 99 mg/dL 098   119   BUN 6 - 20 mg/dL 9  8     6    Creatinine 0.44 - 1.00 mg/dL 1.47  0.4     8.29   Sodium 135 - 145 mmol/L 135  136     138   Potassium 3.5 - 5.1 mmol/L 3.2  3.1     3.3   Chloride 98 - 111 mmol/L 97  96     101   CO2 22 - 32 mmol/L 29  28     28    Calcium 8.9 - 10.3 mg/dL 8.7  9.5     8.8   Total Protein 6.5 - 8.1 g/dL 6.3   6.8   Total Bilirubin 0.3 - 1.2 mg/dL 0.3   0.3   Alkaline Phos 38 - 126 U/L 107  110     89   AST 15 - 41 U/L 18  27     21    ALT 0 - 44 U/L 21  25     26       This result is from an external source.    ASSESSMENT & PLAN:  A 47 y.o. female with metastatic pancreatic cancer. She will proceed with her 7th cycle of FOLFIRINOX this week.  Due to her increased neuropathy.  I will decrease her oxaliplatin dose by 25%.  It ultimately may get discontinued altogether if her neuropathy remains a prominent issue.  As she has questionable issues with dehydration, I will give her 1 Liter of fluids with her next cycle of chemotherapy to make her more euvolemic.  Overall, the patient continues to do very well. I will see her back in 2 weeks before she heads into her 8th cycle of FOLFIRINOX. The patient understands all the plans discussed today and is in agreement with them.   Keydi Giel Kirby Funk, MD

## 2023-02-24 NOTE — Progress Notes (Deleted)
[facility]  [address] [place],  [place]  [address] [phone]  Clinic Day:  02/24/2023  Referring physician: Barbie Banner, MD   HISTORY OF PRESENT ILLNESS:  The patient is a 47 y.o. female with metastatic pancreatic cancer, with spread of disease to her peritoneum, uterus and ovaries.  She comes in today to be evaluated prior to her 8th cycle of FOLFIRINOX. Due to neuropathy, the oxaliplatin dose was reduced by 25% with her 7th cycle. She also required IV potassium and fluids with her 7th cycle. The patient claims to have had severe nausea and vomiting with her 7th cycle of FOLFIRINOX.  She was given olanzapine 5 mg at bedtime for 5 days with improvement in her nausea and vomiting.  She did not take olanzapine last night and had recurrent nausea and vomiting early this morning.  She also now has diarrhea.  She reports chills and sweats, have been chronic.  She denies fever.  She continues to complain of severe cold sensitivity.  She also has had restless arms and legs.  She would like to skip next week's treatment and reschedule in 2 weeks.  She continues Eliquis due to a small right-sided pulmonary embolus seen on February 2024 CT scans.  She denies having increased abdominal distention, pain, or other symptoms which concern her for overt signs of disease progression.    Her history dates back to January 2024 when she first began having lower quadrant abdominal pain.  Over a span of weeks, she began having abdominal swelling and distention.  She also began having increased constipation.  This constellation of symptoms led to her undergoing CT scans of her abdomen/pelvis, which reveal a 7.9 cm cystic pancreatic lesion, with an irregular stomach border.  There was extensive ascites, peritoneal carcinomatosis, bilateral adnexal masses and uterine involvement.  The patient underwent a paracentesis in early February 2024, which revealed malignant cells, consistent  with adenocarcinoma.  She also underwent an endometrial biopsy, which revealed metastatic adenocarcinoma.  In particular, immunohistochemical stains were positive for CK7/CDX2/CK20.  PAX8 and ER staining was negative. This pattern was consistent with a malignancy from an upper GI/pancreaticobiliary origin.  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   PHYSICAL EXAM:  Last menstrual period 10/18/2022, unknown if currently breastfeeding. Wt Readings from Last 3 Encounters:  02/08/23 121 lb 11.2 oz (55.2 kg)  02/04/23 119 lb 1.9 oz (54 kg)  02/01/23 130 lb 8 oz (59.2 kg)   There is no height or weight on file to calculate BMI.  Performance status (ECOG): 3 - Symptomatic, >50% confined to bed  Physical Exam Vitals and nursing note reviewed.  Constitutional:      General: She is not in acute distress.    Appearance: Normal appearance.  HENT:     Head: Normocephalic and atraumatic.     Mouth/Throat:     Mouth: Mucous membranes are moist.     Pharynx: Oropharynx is clear. No oropharyngeal exudate or posterior oropharyngeal erythema.  Eyes:     General: No scleral icterus.    Extraocular Movements: Extraocular movements intact.     Conjunctiva/sclera: Conjunctivae normal.     Pupils: Pupils are equal, round, and reactive to light.  Cardiovascular:     Rate and Rhythm: Normal rate and regular rhythm.     Heart sounds: Normal heart sounds. No murmur heard.    No friction rub. No gallop.  Pulmonary:     Effort: Pulmonary effort is normal.  Breath sounds: Normal breath sounds. No wheezing, rhonchi or rales.  Abdominal:     General: Bowel sounds are decreased. There is distension (Abdomen is mildly distended did, but soft).     Palpations: Abdomen is soft. There is no hepatomegaly, splenomegaly or mass.     Tenderness: There is no abdominal tenderness.  Musculoskeletal:        General: Normal range of motion.     Cervical back: Normal range of motion  and neck supple. No tenderness.     Right lower leg: No edema.     Left lower leg: No edema.  Lymphadenopathy:     Cervical: No cervical adenopathy.     Upper Body:     Right upper body: No supraclavicular or axillary adenopathy.     Left upper body: No supraclavicular or axillary adenopathy.     Lower Body: No right inguinal adenopathy. No left inguinal adenopathy.  Skin:    General: Skin is warm and dry.     Coloration: Skin is not jaundiced.     Findings: No rash.  Neurological:     Mental Status: She is alert and oriented to person, place, and time.     Cranial Nerves: No cranial nerve deficit.  Psychiatric:        Mood and Affect: Mood normal.        Behavior: Behavior normal.        Thought Content: Thought content normal.     LABS:      Latest Ref Rng & Units 02/08/2023   12:00 AM 01/28/2023   12:00 AM 01/14/2023   12:00 AM  CBC  WBC  4.3     3.0     4.6      Hemoglobin 12.0 - 16.0 10.6     10.0     9.9      Hematocrit 36 - 46 33     31     31      Platelets 150 - 400 K/uL 132     166     192         This result is from an external source.       Latest Ref Rng & Units 02/08/2023    8:30 AM 02/08/2023   12:00 AM 01/28/2023    8:33 AM  CMP  Glucose 70 - 99 mg/dL 161   096   BUN 6 - 20 mg/dL 9  8     6    Creatinine 0.44 - 1.00 mg/dL 0.45  0.4     4.09   Sodium 135 - 145 mmol/L 135  136     138   Potassium 3.5 - 5.1 mmol/L 3.2  3.1     3.3   Chloride 98 - 111 mmol/L 97  96     101   CO2 22 - 32 mmol/L 29  28     28    Calcium 8.9 - 10.3 mg/dL 8.7  9.5     8.8   Total Protein 6.5 - 8.1 g/dL 6.3   6.8   Total Bilirubin 0.3 - 1.2 mg/dL 0.3   0.3   Alkaline Phos 38 - 126 U/L 107  110     89   AST 15 - 41 U/L 18  27     21    ALT 0 - 44 U/L 21  25     26       This result is from an external source.  Lab Results  Component Value Date   CEA1 84.0 (H) 10/19/2022   /  CEA  Date Value Ref Range Status  10/19/2022 84.0 (H) 0.0 - 4.7 ng/mL Final    Comment:     (NOTE)                             Nonsmokers          <3.9                             Smokers             <5.6 [employer] Electrochemiluminescence Immunoassay (ECLIA) Values obtained with different assay methods or kits cannot be used interchangeably.  Results cannot be interpreted as absolute evidence of the presence or absence of malignant disease. Performed At: [facility] [address] [place], [place] [id number] Jolene Schimke MD WU:[id number]      Lab Results  Component Value Date   NFA213 25 10/19/2022   Lab Results  Component Value Date   CAN125 314.0 (H) 10/19/2022     Lab Results  Component Value Date   TIBC 259 02/08/2023   TIBC 255 10/20/2022   FERRITIN 985 (H) 02/08/2023   FERRITIN 42 10/20/2022   IRONPCTSAT 12 02/08/2023   IRONPCTSAT 6 (L) 10/20/2022       Component Value Date/Time   CEA1 84.0 (H) 10/19/2022 1653    Review Flowsheet  More data may exist      Latest Ref Rng & Units 10/19/2022 10/20/2022 02/08/2023  Oncology Labs  CA 19-9 0 - 35 U/mL 25  - -  Cancer Antigen (CA) 125 0.0 - 38.1 U/mL 314.0  - -  Ferritin 11 - 307 ng/mL - 42  985   %SAT 10.4 - 31.8 % - 6  12   CEA 0.0 - 4.7 ng/mL 84.0  - -    STUDIES:  No results found.    ASSESSMENT & PLAN:   Assessment/Plan:  47 y.o. female with metastatic pancreatic cancer.  She is struggling with the side effects of treatment, so we will hold her 8th cycle of FOLFIRINOX for 2 weeks at her request.  We will plan to see her again at that time to hopefully resume treatment.  I offered her IV fluids today, but she declined.  She has persistent hypokalemia, so I will send in another 5-day course of potassium supplement.  The patient understands all the plans discussed today and is in agreement with them.  She knows to contact our office if she develops concerns prior to her next appointment.     Weston Settle, MD   Physician Assistant [facility]  [place] [phone]

## 2023-02-25 ENCOUNTER — Inpatient Hospital Stay: Payer: Medicaid Other

## 2023-02-25 ENCOUNTER — Inpatient Hospital Stay: Payer: Medicaid Other | Attending: Oncology | Admitting: Oncology

## 2023-02-25 ENCOUNTER — Encounter: Payer: Self-pay | Admitting: Oncology

## 2023-02-25 ENCOUNTER — Other Ambulatory Visit: Payer: Self-pay | Admitting: Oncology

## 2023-02-25 VITALS — BP 129/84 | HR 104 | Temp 98.9°F | Resp 18 | Ht 59.0 in | Wt 129.0 lb

## 2023-02-25 DIAGNOSIS — K5900 Constipation, unspecified: Secondary | ICD-10-CM | POA: Diagnosis not present

## 2023-02-25 DIAGNOSIS — C786 Secondary malignant neoplasm of retroperitoneum and peritoneum: Secondary | ICD-10-CM | POA: Diagnosis not present

## 2023-02-25 DIAGNOSIS — G893 Neoplasm related pain (acute) (chronic): Secondary | ICD-10-CM

## 2023-02-25 DIAGNOSIS — C259 Malignant neoplasm of pancreas, unspecified: Secondary | ICD-10-CM

## 2023-02-25 DIAGNOSIS — Z5111 Encounter for antineoplastic chemotherapy: Secondary | ICD-10-CM | POA: Diagnosis not present

## 2023-02-25 DIAGNOSIS — G629 Polyneuropathy, unspecified: Secondary | ICD-10-CM | POA: Insufficient documentation

## 2023-02-25 DIAGNOSIS — Z7901 Long term (current) use of anticoagulants: Secondary | ICD-10-CM | POA: Insufficient documentation

## 2023-02-25 DIAGNOSIS — C250 Malignant neoplasm of head of pancreas: Secondary | ICD-10-CM | POA: Insufficient documentation

## 2023-02-25 DIAGNOSIS — C7963 Secondary malignant neoplasm of bilateral ovaries: Secondary | ICD-10-CM | POA: Insufficient documentation

## 2023-02-25 DIAGNOSIS — Z86711 Personal history of pulmonary embolism: Secondary | ICD-10-CM | POA: Insufficient documentation

## 2023-02-25 DIAGNOSIS — R112 Nausea with vomiting, unspecified: Secondary | ICD-10-CM

## 2023-02-25 LAB — CMP (CANCER CENTER ONLY)
ALT: 12 U/L (ref 0–44)
AST: 15 U/L (ref 15–41)
Albumin: 3 g/dL — ABNORMAL LOW (ref 3.5–5.0)
Alkaline Phosphatase: 86 U/L (ref 38–126)
Anion gap: 10 (ref 5–15)
BUN: 9 mg/dL (ref 6–20)
CO2: 26 mmol/L (ref 22–32)
Calcium: 9.2 mg/dL (ref 8.9–10.3)
Chloride: 100 mmol/L (ref 98–111)
Creatinine: 0.48 mg/dL (ref 0.44–1.00)
GFR, Estimated: >60 mL/min (ref >60)
Glucose, Bld: 173 mg/dL — ABNORMAL HIGH (ref 70–99)
Potassium: 3.6 mmol/L (ref 3.5–5.1)
Sodium: 136 mmol/L (ref 135–145)
Total Bilirubin: 0.4 mg/dL (ref 0.3–1.2)
Total Protein: 6.7 g/dL (ref 6.5–8.1)

## 2023-02-25 LAB — CBC AND DIFFERENTIAL
HCT: 37 (ref 36–46)
Hemoglobin: 11.7 — AB (ref 12.0–16.0)
Neutrophils Absolute: 6.16
Platelets: 291 10*3/uL (ref 150–400)
WBC: 8

## 2023-02-25 LAB — CBC: RBC: 4.35 (ref 3.87–5.11)

## 2023-02-25 MED ORDER — HYDROMORPHONE HCL 1 MG/ML IJ SOLN
2.0000 mg | Freq: Once | INTRAMUSCULAR | Status: AC
  Administered 2023-02-25: 2 mg via INTRAVENOUS
  Filled 2023-02-25: qty 2

## 2023-02-25 MED ORDER — SODIUM CHLORIDE 0.9 % IV SOLN: Freq: Once | INTRAVENOUS | Status: AC

## 2023-02-25 MED ORDER — DEXAMETHASONE SODIUM PHOSPHATE 10 MG/ML IJ SOLN
10.0000 mg | Freq: Once | INTRAMUSCULAR | Status: AC
  Administered 2023-02-25: 10 mg via INTRAVENOUS
  Filled 2023-02-25: qty 1

## 2023-02-25 MED ORDER — ONDANSETRON HCL 4 MG/2ML IJ SOLN
8.0000 mg | INTRAMUSCULAR | Status: DC | PRN
  Administered 2023-02-25: 8 mg via INTRAVENOUS
  Filled 2023-02-25: qty 4

## 2023-02-25 NOTE — Progress Notes (Unsigned)
Sent in request for DOS 02/27/2023 to [employer].

## 2023-02-27 ENCOUNTER — Inpatient Hospital Stay: Payer: Medicaid Other | Admitting: Dietician

## 2023-02-27 ENCOUNTER — Inpatient Hospital Stay: Payer: Medicaid Other

## 2023-02-27 NOTE — Progress Notes (Signed)
Nutrition Follow-up:  Called patient at home telephone number. She feels much stronger since skipping past round of chemo.  Got some steroids, fluids, and IV pain meds this week. No vomiting for a few days, but she is worried that she'll vomit the barium solution she has to drink for her CT scan this afternoon. Drinking lots of water, and juice and Ensure QD but just ran out.  She reports just started getting a little relief from her cold sensitivity this past month. Drinking "enough to pee constantly."  Bowels are moving and no diarrhea.  She reports good appetite and eating whatever she wants, listed eggs and bacon, pork chops, lots of fruits.    Medications: Compazine for nausea  Labs: 02/25/23  K now WNL, Albumin 3.0, Hgb 11.7 improving  Anthropometrics: Weight 8# gain past 2 weeks after 11# loss last month  Height: 59" Weight:  02/25/23  129# 02/08/23  121.7# 02/04/23  119# 02/01/23 130.5# 01/15/23  135# 12/07/22  142# 11/17/22  151.6# UBW: 170-180 BMI: 28.7   Estimated Nutritional Needs:    Kcal:  1500-1700   Protein:  75-90 g   Fluid:  1.5L/day  NUTRITION DIAGNOSIS: Inadequate intake Improving with some fluctuations in intake and weight.  Resolving with improved symptom management   MALNUTRITION DIAGNOSIS: Moderate Malnutrition related to chronic illness, cancer and cancer related treatments as evidenced by mild fat depletion, moderate muscle depletion.    INTERVENTION:   Encouraged continued attention to high protein choices. Offered recipes or resources, patient declined.  MONITORING, EVALUATION, GOAL: weight, PO intake, Nutrition Impact Symptoms, labs Goal is weight maintenance  NEXT VISIT: PRN at patient or provider request   Gennaro Africa, RDN, LDN Registered Dietitian, [facility] Part Time Remote (Usual office hours: Tuesday-Thursday) Mobile: [phone]

## 2023-02-27 NOTE — Progress Notes (Signed)
[facility]  [address] [place],  [place]  [address] [phone]  Clinic Day:  02/28/2023  Referring physician: Barbie Banner, MD  HISTORY OF PRESENT ILLNESS:  The patient is a 47 y.o. female  with metastatic pancreatic cancer, with spread of disease to her peritoneum, uterus and ovaries.  She comes in today to go over her CT scans to ascertain her new disease baseline after 7 cycles of FOLFIRINOX.  At her visit earlier this week, she was in significant abdominal discomfort.  However, after supportive care was given, this pain has since abated.  Clinically, she is doing much better.  She denies having abdominal distention, pain, or other symptoms which concern her for overt signs of disease progression.  Of note, she remains on Eliquis due to a small right-sided pulmonary embolus seen on February 2024 CT scans.    Her history dates back to January 2024 when she first began having lower quadrant abdominal pain.  Over a span of weeks, she began having abdominal swelling and distention.  She also began having increased constipation.  This constellation of symptoms led to her undergoing CT scans of her abdomen/pelvis, which reveal a 7.9 cm cystic pancreatic lesion, with an irregular stomach border.  There was extensive ascites, peritoneal carcinomatosis, bilateral adnexal masses and uterine involvement.  The patient underwent a paracentesis in early February 2024, which revealed malignant cells, consistent with adenocarcinoma.  She also underwent an endometrial biopsy, which revealed metastatic adenocarcinoma.  In particular, immunohistochemical stains were positive for CK7/CDX2/CK20.  PAX8 and ER staining was negative. This pattern was consistent with a malignancy from an upper GI/pancreaticobiliary origin.  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   PHYSICAL EXAM:  Blood pressure 117/77, pulse 84, temperature 98.9 F (37.2 C),  resp. rate 14, height 4\' 11"  (1.499 m), weight 129 lb 9.6 oz (58.8 kg), SpO2 96 %, unknown if currently breastfeeding. Wt Readings from Last 3 Encounters:  02/28/23 129 lb 9.6 oz (58.8 kg)  02/25/23 129 lb (58.5 kg)  02/25/23 128 lb 1.6 oz (58.1 kg)   Body mass index is 26.18 kg/m. Performance status (ECOG): 1 Physical Exam Constitutional:      Appearance: Normal appearance.     Comments: She appears stronger and healthier vs previous visits  HENT:     Mouth/Throat:     Mouth: Mucous membranes are moist.     Pharynx: Oropharynx is clear. No oropharyngeal exudate or posterior oropharyngeal erythema.  Cardiovascular:     Rate and Rhythm: Normal rate and regular rhythm.     Heart sounds: No murmur heard.    No friction rub. No gallop.  Pulmonary:     Effort: Pulmonary effort is normal. No respiratory distress.     Breath sounds: Normal breath sounds. No wheezing, rhonchi or rales.  Abdominal:     General: Bowel sounds are normal. There is no distension.     Palpations: Abdomen is soft. There is no mass.     Tenderness: There is no abdominal tenderness.  Musculoskeletal:        General: No swelling.     Right lower leg: No edema.     Left lower leg: No edema.  Lymphadenopathy:     Cervical: No cervical adenopathy.     Upper Body:     Right upper body: No supraclavicular or axillary adenopathy.     Left upper body: No supraclavicular or axillary adenopathy.     Lower Body:  No right inguinal adenopathy. No left inguinal adenopathy.  Skin:    General: Skin is warm.     Coloration: Skin is not jaundiced.     Findings: No lesion or rash.  Neurological:     General: No focal deficit present.     Mental Status: She is alert and oriented to person, place, and time. Mental status is at baseline.  Psychiatric:        Mood and Affect: Mood normal.        Behavior: Behavior normal.        Thought Content: Thought content normal.   SCANS:  CT scans of her abdomen/pelvis revealed the  following: FINDINGS: Lower chest: Diminished left pleural effusion, now moderate. Unchanged, nonacute, nonocclusive embolus within the right lower lobe pulmonary arteries (series 2, image 4)  Hepatobiliary: No solid liver abnormality is seen. Contracted gallbladder. No gallstones, gallbladder wall thickening, or biliary dilatation.  Pancreas: Unremarkable. No pancreatic ductal dilatation or surrounding inflammatory changes.  Spleen: Normal in size without significant abnormality.  Adrenals/Urinary Tract: Adrenal glands are unremarkable. Small nonobstructive calculi of the left kidney. No right-sided calculi, ureteral calculi, or hydronephrosis. Bladder is unremarkable.  Stomach/Bowel: Stomach is within normal limits. Appendix not clearly visualized. No evidence of bowel wall thickening, distention, or inflammatory changes.  Vascular/Lymphatic: Aortic atherosclerosis. 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 No enlarged abdominal or pelvic lymph nodes.  Reproductive: Diminished size of bilateral ovarian masses, on the left measuring 6.8 x 6.3 cm, previously 8.4 x 6.8 cm (series 2, image 73), on the right measuring 4.6 x 3.8 cm, previously 7.4 x 4.5 cm (series 2, image 70). Interval increase in heterogeneous, masslike hypoattenuation of the uterine fundus, measuring 4.2 x 3.5 cm, previously no greater than 1.7 x 1.4 cm (series 2, image 79).  Other: Mild anasarca. Small volume ascites throughout the abdomen and pelvis, similar to prior examination. A previously seen discrete appearing fluid loculation overlying the pancreas and greater curvature of the stomach is not clearly appreciated on today's examination, with free appearing fluid in this vicinity (series 2, image 38). Diminished soft tissue and loculated appearing fluid within the small bowel  mesentery (series 2, image 66, series 2, image 87).  Musculoskeletal: No acute or significant osseous findings.  IMPRESSION: 1. Diminished size of bilateral ovarian masses. 2. Diminished soft tissue and loculated appearing fluid within the small bowel mesentery. 3. Small volume ascites throughout the abdomen and pelvis, similar to prior examination. 4. A previously seen discrete appearing fluid loculation overlying the pancreas and greater curvature of the stomach is not clearly appreciated on today's examination, with free appearing fluid in this vicinity. 5. Overall findings are consistent with improved peritoneal, mesenteric, and ovarian metastatic disease. 6. Interval increase in heterogeneous, masslike hypoattenuation of the uterine fundus, measuring 4.2 x 3.5 cm, previously no greater than 1.7 x 1.4 cm. This may reflect fluid within the endometrial cavity but is not well assessed by CT. Consider initial pelvic ultrasound and further consideration of pelvic MRI to better characterize. 7. 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 8. Diminished left pleural effusion, now moderate. 9. Unchanged, nonacute, nonocclusive embolus within the right lower lobe pulmonary arteries. 10. Nonobstructive left nephrolithiasis.  Aortic Atherosclerosis (ICD10-I70.0).  LABS:      Latest Ref Rng & Units 02/25/2023   12:00  AM 02/08/2023   12:00 AM 01/28/2023   12:00 AM  CBC  WBC  8.0     4.3     3.0      Hemoglobin 12.0 - 16.0 11.7     10.6     10.0      Hematocrit 36 - 46 37     33     31      Platelets 150 - 400 K/uL 291     132     166         This result is from an external source.      Latest Ref Rng & Units 02/25/2023    1:00 PM 02/08/2023    8:30 AM 02/08/2023   12:00 AM  CMP  Glucose 70 - 99 mg/dL 562  130    BUN 6 - 20 mg/dL 9  9  8       Creatinine 0.44 - 1.00 mg/dL 8.65  7.84  0.4       Sodium 135 - 145 mmol/L 136  135  136      Potassium 3.5 - 5.1 mmol/L 3.6  3.2  3.1      Chloride 98 - 111 mmol/L 100  97  96      CO2 22 - 32 mmol/L 26  29  28       Calcium 8.9 - 10.3 mg/dL 9.2  8.7  9.5      Total Protein 6.5 - 8.1 g/dL 6.7  6.3    Total Bilirubin 0.3 - 1.2 mg/dL 0.4  0.3    Alkaline Phos 38 - 126 U/L 86  107  110      AST 15 - 41 U/L 15  18  27       ALT 0 - 44 U/L 12  21  25          This result is from an external source.   ASSESSMENT & PLAN:  A 47 y.o. female with metastatic pancreatic cancer.  In clinic today, I went over all of her CT scan images with her, for which she could see that her pancreatic lesion has essentially dissipated.  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  Furthermore, she is symptomatically doing much better.  Based upon all of this, she will proceed with her eighth cycle of FOLFIRINOX chemotherapy on Wednesday, June 26th.  As each successive cycle of chemotherapy has become more difficult, her doses will be decreased by 25%.  Otherwise, I will see her back in early July before she heads into her 9th cycle of FOLFIRINOX.  The patient understands all the plans discussed today and is in agreement with them.   Kenston Longton Kirby Funk, MD

## 2023-02-28 ENCOUNTER — Inpatient Hospital Stay (INDEPENDENT_AMBULATORY_CARE_PROVIDER_SITE_OTHER): Payer: Medicaid Other | Admitting: Oncology

## 2023-02-28 VITALS — BP 117/77 | HR 84 | Temp 98.9°F | Resp 14 | Ht 59.0 in | Wt 129.6 lb

## 2023-02-28 DIAGNOSIS — C786 Secondary malignant neoplasm of retroperitoneum and peritoneum: Secondary | ICD-10-CM

## 2023-03-01 ENCOUNTER — Encounter: Payer: Self-pay | Admitting: Oncology

## 2023-03-01 ENCOUNTER — Other Ambulatory Visit: Payer: Self-pay | Admitting: Oncology

## 2023-03-01 DIAGNOSIS — C786 Secondary malignant neoplasm of retroperitoneum and peritoneum: Secondary | ICD-10-CM

## 2023-03-02 ENCOUNTER — Other Ambulatory Visit: Payer: Self-pay

## 2023-03-04 ENCOUNTER — Ambulatory Visit: Payer: Medicaid Other

## 2023-03-05 ENCOUNTER — Ambulatory Visit: Payer: Medicaid Other

## 2023-03-05 ENCOUNTER — Other Ambulatory Visit: Payer: Self-pay

## 2023-03-07 ENCOUNTER — Telehealth: Payer: Self-pay

## 2023-03-07 NOTE — Telephone Encounter (Signed)
Pt LM stating she was having horrible pain in lower back, abdomen.  Per DR. Lewis, Double 10 mg oxy and add 600 to 800 mg ibuprofen.  Patient was in agreement with these instructions.

## 2023-03-08 ENCOUNTER — Encounter: Payer: Self-pay | Admitting: Oncology

## 2023-03-08 NOTE — Progress Notes (Signed)
Sent in request for DOS 03/13/2023 to [employer].

## 2023-03-11 ENCOUNTER — Inpatient Hospital Stay: Payer: Medicaid Other

## 2023-03-11 ENCOUNTER — Encounter: Payer: Self-pay | Admitting: Oncology

## 2023-03-11 DIAGNOSIS — Z5111 Encounter for antineoplastic chemotherapy: Secondary | ICD-10-CM | POA: Diagnosis not present

## 2023-03-11 DIAGNOSIS — C786 Secondary malignant neoplasm of retroperitoneum and peritoneum: Secondary | ICD-10-CM

## 2023-03-11 LAB — CBC WITH DIFFERENTIAL (CANCER CENTER ONLY)
Abs Immature Granulocytes: 0.02 10*3/uL (ref 0.00–0.07)
Basophils Absolute: 0 10*3/uL (ref 0.0–0.1)
Basophils Relative: 0 %
Eosinophils Absolute: 0.1 10*3/uL (ref 0.0–0.5)
Eosinophils Relative: 1 %
HCT: 40.3 % (ref 36.0–46.0)
Hemoglobin: 12.4 g/dL (ref 12.0–15.0)
Immature Granulocytes: 0 %
Lymphocytes Relative: 16 %
Lymphs Abs: 1.4 10*3/uL (ref 0.7–4.0)
MCH: 26.8 pg (ref 26.0–34.0)
MCHC: 30.8 g/dL (ref 30.0–36.0)
MCV: 87 fL (ref 80.0–100.0)
Monocytes Absolute: 0.5 10*3/uL (ref 0.1–1.0)
Monocytes Relative: 6 %
Neutro Abs: 6.8 10*3/uL (ref 1.7–7.7)
Neutrophils Relative %: 77 %
Platelet Count: 300 10*3/uL (ref 150–400)
RBC: 4.63 MIL/uL (ref 3.87–5.11)
RDW: 16 % — ABNORMAL HIGH (ref 11.5–15.5)
WBC Count: 8.8 10*3/uL (ref 4.0–10.5)
nRBC: 0 % (ref 0.0–0.2)

## 2023-03-11 LAB — CMP (CANCER CENTER ONLY)
ALT: 11 U/L (ref 0–44)
AST: 14 U/L — ABNORMAL LOW (ref 15–41)
Albumin: 3.1 g/dL — ABNORMAL LOW (ref 3.5–5.0)
Alkaline Phosphatase: 92 U/L (ref 38–126)
Anion gap: 13 (ref 5–15)
BUN: 14 mg/dL (ref 6–20)
CO2: 29 mmol/L (ref 22–32)
Calcium: 9.2 mg/dL (ref 8.9–10.3)
Chloride: 90 mmol/L — ABNORMAL LOW (ref 98–111)
Creatinine: 0.8 mg/dL (ref 0.44–1.00)
GFR, Estimated: >60 mL/min (ref >60)
Glucose, Bld: 163 mg/dL — ABNORMAL HIGH (ref 70–99)
Potassium: 3.1 mmol/L — ABNORMAL LOW (ref 3.5–5.1)
Sodium: 132 mmol/L — ABNORMAL LOW (ref 135–145)
Total Bilirubin: 0.7 mg/dL (ref 0.3–1.2)
Total Protein: 6.8 g/dL (ref 6.5–8.1)

## 2023-03-12 ENCOUNTER — Other Ambulatory Visit: Payer: Self-pay | Admitting: Pharmacist

## 2023-03-12 DIAGNOSIS — R630 Anorexia: Secondary | ICD-10-CM

## 2023-03-12 MED FILL — Oxaliplatin IV Soln 100 MG/20ML: INTRAVENOUS | Qty: 15 | Status: AC

## 2023-03-12 MED FILL — Fosaprepitant Dimeglumine For IV Infusion 150 MG (Base Eq): INTRAVENOUS | Qty: 5 | Status: AC

## 2023-03-12 MED FILL — Dexamethasone Sodium Phosphate Inj 100 MG/10ML: INTRAMUSCULAR | Qty: 1 | Status: AC

## 2023-03-12 MED FILL — Irinotecan HCl Inj 100 MG/5ML (20 MG/ML): INTRAVENOUS | Qty: 9 | Status: AC

## 2023-03-12 MED FILL — Leucovorin Calcium For Inj 350 MG: INTRAMUSCULAR | Qty: 23.9 | Status: AC

## 2023-03-12 MED FILL — Fluorouracil IV Soln 5 GM/100ML (50 MG/ML): INTRAVENOUS | Qty: 57 | Status: AC

## 2023-03-13 ENCOUNTER — Inpatient Hospital Stay: Payer: Medicaid Other

## 2023-03-13 VITALS — BP 166/74 | HR 74 | Temp 98.4°F | Resp 18 | Ht 59.0 in | Wt 125.0 lb

## 2023-03-13 DIAGNOSIS — Z5111 Encounter for antineoplastic chemotherapy: Secondary | ICD-10-CM | POA: Diagnosis not present

## 2023-03-13 DIAGNOSIS — C786 Secondary malignant neoplasm of retroperitoneum and peritoneum: Secondary | ICD-10-CM

## 2023-03-13 MED ORDER — FAMOTIDINE IN NACL 20-0.9 MG/50ML-% IV SOLN
20.0000 mg | Freq: Once | INTRAVENOUS | Status: AC
  Administered 2023-03-13: 20 mg via INTRAVENOUS
  Filled 2023-03-13: qty 50

## 2023-03-13 MED ORDER — LEUCOVORIN CALCIUM INJECTION 350 MG
300.0000 mg/m2 | Freq: Once | INTRAVENOUS | Status: AC
  Administered 2023-03-13: 478 mg via INTRAVENOUS
  Filled 2023-03-13: qty 23.9

## 2023-03-13 MED ORDER — DEXTROSE 5 % IV SOLN: Freq: Once | INTRAVENOUS | Status: AC

## 2023-03-13 MED ORDER — SODIUM CHLORIDE 0.9 % IV SOLN
150.0000 mg | Freq: Once | INTRAVENOUS | Status: AC
  Administered 2023-03-13: 150 mg via INTRAVENOUS
  Filled 2023-03-13: qty 150

## 2023-03-13 MED ORDER — ATROPINE SULFATE 1 MG/ML IV SOLN
0.5000 mg | Freq: Once | INTRAVENOUS | Status: AC | PRN
  Administered 2023-03-13: 0.5 mg via INTRAVENOUS
  Filled 2023-03-13: qty 1

## 2023-03-13 MED ORDER — IRINOTECAN HCL CHEMO INJECTION 100 MG/5ML
112.5000 mg/m2 | Freq: Once | INTRAVENOUS | Status: AC
  Administered 2023-03-13: 180 mg via INTRAVENOUS
  Filled 2023-03-13: qty 5

## 2023-03-13 MED ORDER — OXALIPLATIN CHEMO INJECTION 100 MG/20ML
47.8125 mg/m2 | Freq: Once | INTRAVENOUS | Status: AC
  Administered 2023-03-13: 75 mg via INTRAVENOUS
  Filled 2023-03-13: qty 15

## 2023-03-13 MED ORDER — SODIUM CHLORIDE 0.9 % IV SOLN
10.0000 mg | Freq: Once | INTRAVENOUS | Status: AC
  Administered 2023-03-13: 10 mg via INTRAVENOUS
  Filled 2023-03-13: qty 10

## 2023-03-13 MED ORDER — SODIUM CHLORIDE 0.9 % IV SOLN
1800.0000 mg/m2 | INTRAVENOUS | Status: DC
  Administered 2023-03-13: 2850 mg via INTRAVENOUS
  Filled 2023-03-13: qty 57

## 2023-03-13 MED ORDER — POTASSIUM CHLORIDE 10 MEQ/100ML IV SOLN
10.0000 meq | INTRAVENOUS | Status: AC
  Administered 2023-03-13 (×2): 10 meq via INTRAVENOUS
  Filled 2023-03-13 (×2): qty 100

## 2023-03-13 MED ORDER — PALONOSETRON HCL INJECTION 0.25 MG/5ML
0.2500 mg | Freq: Once | INTRAVENOUS | Status: AC
  Administered 2023-03-13: 0.25 mg via INTRAVENOUS
  Filled 2023-03-13: qty 5

## 2023-03-13 NOTE — Patient Instructions (Signed)
Fluorouracil Injection What is this medication? FLUOROURACIL (flure oh YOOR a sil) treats some types of cancer. It works by slowing down the growth of cancer cells. This medicine may be used for other purposes; ask your health care provider or pharmacist if you have questions. COMMON BRAND NAME(S): Adrucil What should I tell my care team before I take this medication? They need to know if you have any of these conditions: Blood disorders Dihydropyrimidine dehydrogenase (DPD) deficiency Infection, such as chickenpox, cold sores, herpes Kidney disease Liver disease Poor nutrition Recent or ongoing radiation therapy An unusual or allergic reaction to fluorouracil, other medications, foods, dyes, or preservatives If you or your partner are pregnant or trying to get pregnant Breast-feeding How should I use this medication? This medication is injected into a vein. It is administered by your care team in a hospital or clinic setting. Talk to your care team about the use of this medication in children. Special care may be needed. Overdosage: If you think you have taken too much of this medicine contact a poison control center or emergency room at once. NOTE: This medicine is only for you. Do not share this medicine with others. What if I miss a dose? Keep appointments for follow-up doses. It is important not to miss your dose. Call your care team if you are unable to keep an appointment. What may interact with this medication? Do not take this medication with any of the following: Live virus vaccines This medication may also interact with the following: Medications that treat or prevent blood clots, such as warfarin, enoxaparin, dalteparin This list may not describe all possible interactions. Give your health care provider a list of all the medicines, herbs, non-prescription drugs, or dietary supplements you use. Also tell them if you smoke, drink alcohol, or use illegal drugs. Some items may  interact with your medicine. What should I watch for while using this medication? Your condition will be monitored carefully while you are receiving this medication. This medication may make you feel generally unwell. This is not uncommon as chemotherapy can affect healthy cells as well as cancer cells. Report any side effects. Continue your course of treatment even though you feel ill unless your care team tells you to stop. In some cases, you may be given additional medications to help with side effects. Follow all directions for their use. This medication may increase your risk of getting an infection. Call your care team for advice if you get a fever, chills, sore throat, or other symptoms of a cold or flu. Do not treat yourself. Try to avoid being around people who are sick. This medication may increase your risk to bruise or bleed. Call your care team if you notice any unusual bleeding. Be careful brushing or flossing your teeth or using a toothpick because you may get an infection or bleed more easily. If you have any dental work done, tell your dentist you are receiving this medication. Avoid taking medications that contain aspirin, acetaminophen, ibuprofen, naproxen, or ketoprofen unless instructed by your care team. These medications may hide a fever. Do not treat diarrhea with over the counter products. Contact your care team if you have diarrhea that lasts more than 2 days or if it is severe and watery. This medication can make you more sensitive to the sun. Keep out of the sun. If you cannot avoid being in the sun, wear protective clothing and sunscreen. Do not use sun lamps, tanning beds, or tanning booths. Talk to   your care team if you or your partner wish to become pregnant or think you might be pregnant. This medication can cause serious birth defects if taken during pregnancy and for 3 months after the last dose. A reliable form of contraception is recommended while taking this  medication and for 3 months after the last dose. Talk to your care team about effective forms of contraception. Do not father a child while taking this medication and for 3 months after the last dose. Use a condom while having sex during this time period. Do not breastfeed while taking this medication. This medication may cause infertility. Talk to your care team if you are concerned about your fertility. What side effects may I notice from receiving this medication? Side effects that you should report to your care team as soon as possible: Allergic reactions--skin rash, itching, hives, swelling of the face, lips, tongue, or throat Heart attack--pain or tightness in the chest, shoulders, arms, or jaw, nausea, shortness of breath, cold or clammy skin, feeling faint or lightheaded Heart failure--shortness of breath, swelling of the ankles, feet, or hands, sudden weight gain, unusual weakness or fatigue Heart rhythm changes--fast or irregular heartbeat, dizziness, feeling faint or lightheaded, chest pain, trouble breathing High ammonia level--unusual weakness or fatigue, confusion, loss of appetite, nausea, vomiting, seizures Infection--fever, chills, cough, sore throat, wounds that don't heal, pain or trouble when passing urine, general feeling of discomfort or being unwell Low red blood cell level--unusual weakness or fatigue, dizziness, headache, trouble breathing Pain, tingling, or numbness in the hands or feet, muscle weakness, change in vision, confusion or trouble speaking, loss of balance or coordination, trouble walking, seizures Redness, swelling, and blistering of the skin over hands and feet Severe or prolonged diarrhea Unusual bruising or bleeding Side effects that usually do not require medical attention (report to your care team if they continue or are bothersome): Dry skin Headache Increased tears Nausea Pain, redness, or swelling with sores inside the mouth or throat Sensitivity  to light Vomiting This list may not describe all possible side effects. Call your doctor for medical advice about side effects. You may report side effects to FDA at 1-800-FDA-1088. Where should I keep my medication? This medication is given in a hospital or clinic. It will not be stored at home. NOTE: This sheet is a summary. It may not cover all possible information. If you have questions about this medicine, talk to your doctor, pharmacist, or health care provider.  2023 [employer] Standard (2022-01-02 00:00:00) Leucovorin Injection What is this medication? LEUCOVORIN (loo koe VOR in) prevents side effects from certain medications, such as methotrexate. It works by increasing folate levels. This helps protect healthy cells in your body. It may also be used to treat anemia caused by low levels of folate. It can also be used with fluorouracil, a type of chemotherapy, to treat colorectal cancer. It works by increasing the effects of fluorouracil in the body. This medicine may be used for other purposes; ask your health care provider or pharmacist if you have questions. What should I tell my care team before I take this medication? They need to know if you have any of these conditions: Anemia from low levels of vitamin B12 in the blood An unusual or allergic reaction to leucovorin, folic acid, other medications, foods, dyes, or preservatives Pregnant or trying to get pregnant Breastfeeding How should I use this medication? This medication is injected into a vein or a muscle. It is given by your care team   in a hospital or clinic setting. Talk to your care team about the use of this medication in children. Special care may be needed. Overdosage: If you think you have taken too much of this medicine contact a poison control center or emergency room at once. NOTE: This medicine is only for you. Do not share this medicine with others. What if I miss a dose? Keep appointments for follow-up doses.  It is important not to miss your dose. Call your care team if you are unable to keep an appointment. What may interact with this medication? Capecitabine Fluorouracil Phenobarbital Phenytoin Primidone Trimethoprim;sulfamethoxazole This list may not describe all possible interactions. Give your health care provider a list of all the medicines, herbs, non-prescription drugs, or dietary supplements you use. Also tell them if you smoke, drink alcohol, or use illegal drugs. Some items may interact with your medicine. What should I watch for while using this medication? Your condition will be monitored carefully while you are receiving this medication. This medication may increase the side effects of 5-fluorouracil. Tell your care team if you have diarrhea or mouth sores that do not get better or that get worse. What side effects may I notice from receiving this medication? Side effects that you should report to your care team as soon as possible: Allergic reactions--skin rash, itching, hives, swelling of the face, lips, tongue, or throat This list may not describe all possible side effects. Call your doctor for medical advice about side effects. You may report side effects to FDA at 1-800-FDA-1088. Where should I keep my medication? This medication is given in a hospital or clinic. It will not be stored at home. NOTE: This sheet is a summary. It may not cover all possible information. If you have questions about this medicine, talk to your doctor, pharmacist, or health care provider.  2023 [employer]/Gold Standard (2022-01-12 00:00:00) Irinotecan Injection What is this medication? IRINOTECAN (ir in oh TEE kan) treats some types of cancer. It works by slowing down the growth of cancer cells. This medicine may be used for other purposes; ask your health care provider or pharmacist if you have questions. COMMON BRAND NAME(S): Camptosar What should I tell my care team before I take this  medication? They need to know if you have any of these conditions: Dehydration Diarrhea Infection, especially a viral infection, such as chickenpox, cold sores, herpes Liver disease Low blood cell levels (white cells, red cells, and platelets) Low levels of electrolytes, such as calcium, magnesium, or potassium in your blood Recent or ongoing radiation An unusual or allergic reaction to irinotecan, other medications, foods, dyes, or preservatives If you or your partner are pregnant or trying to get pregnant Breast-feeding How should I use this medication? This medication is injected into a vein. It is given by your care team in a hospital or clinic setting. Talk to your care team about the use of this medication in children. Special care may be needed. Overdosage: If you think you have taken too much of this medicine contact a poison control center or emergency room at once. NOTE: This medicine is only for you. Do not share this medicine with others. What if I miss a dose? Keep appointments for follow-up doses. It is important not to miss your dose. Call your care team if you are unable to keep an appointment. What may interact with this medication? Do not take this medication with any of the following: Cobicistat Itraconazole This medication may also interact with the following:   Certain antibiotics, such as clarithromycin, rifampin, rifabutin Certain antivirals for HIV or AIDS Certain medications for fungal infections, such as ketoconazole, posaconazole, voriconazole Certain medications for seizures, such as carbamazepine, phenobarbital, phenytoin Gemfibrozil Nefazodone St. John's wort This list may not describe all possible interactions. Give your health care provider a list of all the medicines, herbs, non-prescription drugs, or dietary supplements you use. Also tell them if you smoke, drink alcohol, or use illegal drugs. Some items may interact with your medicine. What should I  watch for while using this medication? Your condition will be monitored carefully while you are receiving this medication. You may need blood work while taking this medication. This medication may make you feel generally unwell. This is not uncommon as chemotherapy can affect healthy cells as well as cancer cells. Report any side effects. Continue your course of treatment even though you feel ill unless your care team tells you to stop. This medication can cause serious side effects. To reduce the risk, your care team may give you other medications to take before receiving this one. Be sure to follow the directions from your care team. This medication may affect your coordination, reaction time, or judgement. Do not drive or operate machinery until you know how this medication affects you. Sit up or stand slowly to reduce the risk of dizzy or fainting spells. Drinking alcohol with this medication can increase the risk of these side effects. This medication may increase your risk of getting an infection. Call your care team for advice if you get a fever, chills, sore throat, or other symptoms of a cold or flu. Do not treat yourself. Try to avoid being around people who are sick. Avoid taking medications that contain aspirin, acetaminophen, ibuprofen, naproxen, or ketoprofen unless instructed by your care team. These medications may hide a fever. This medication may increase your risk to bruise or bleed. Call your care team if you notice any unusual bleeding. Be careful brushing or flossing your teeth or using a toothpick because you may get an infection or bleed more easily. If you have any dental work done, tell your dentist you are receiving this medication. Talk to your care team if you or your partner are pregnant or think either of you might be pregnant. This medication can cause serious birth defects if taken during pregnancy and for 6 months after the last dose. You will need a negative pregnancy test  before starting this medication. Contraception is recommended while taking this medication and for 6 months after the last dose. Your care team can help you find the option that works for you. Do not father a child while taking this medication and for 3 months after the last dose. Use a condom for contraception during this time period. Do not breastfeed while taking this medication and for 7 days after the last dose. This medication may cause infertility. Talk to your care team if you are concerned about your fertility. What side effects may I notice from receiving this medication? Side effects that you should report to your care team as soon as possible: Allergic reactions--skin rash, itching, hives, swelling of the face, lips, tongue, or throat Dry cough, shortness of breath or trouble breathing Increased saliva or tears, increased sweating, stomach cramping, diarrhea, small pupils, unusual weakness or fatigue, slow heartbeat Infection--fever, chills, cough, sore throat, wounds that don't heal, pain or trouble when passing urine, general feeling of discomfort or being unwell Kidney injury--decrease in the amount of urine, swelling of the   ankles, hands, or feet Low red blood cell level--unusual weakness or fatigue, dizziness, headache, trouble breathing Severe or prolonged diarrhea Unusual bruising or bleeding Side effects that usually do not require medical attention (report to your care team if they continue or are bothersome): Constipation Diarrhea Hair loss Loss of appetite Nausea Stomach pain This list may not describe all possible side effects. Call your doctor for medical advice about side effects. You may report side effects to FDA at 1-800-FDA-1088. Where should I keep my medication? This medication is given in a hospital or clinic. It will not be stored at home. NOTE: This sheet is a summary. It may not cover all possible information. If you have questions about this medicine, talk  to your doctor, pharmacist, or health care provider.  2023 [employer]/Gold Standard (2022-01-11 00:00:00) Oxaliplatin Injection What is this medication? OXALIPLATIN (ox AL i PLA tin) treats colorectal cancer. It works by slowing down the growth of cancer cells. This medicine may be used for other purposes; ask your health care provider or pharmacist if you have questions. COMMON BRAND NAME(S): Eloxatin What should I tell my care team before I take this medication? They need to know if you have any of these conditions: Heart disease History of irregular heartbeat or rhythm Liver disease Low blood cell levels (white cells, red cells, and platelets) Lung or breathing disease, such as asthma Take medications that treat or prevent blood clots Tingling of the fingers, toes, or other nerve disorder An unusual or allergic reaction to oxaliplatin, other medications, foods, dyes, or preservatives If you or your partner are pregnant or trying to get pregnant Breast-feeding How should I use this medication? This medication is injected into a vein. It is given by your care team in a hospital or clinic setting. Talk to your care team about the use of this medication in children. Special care may be needed. Overdosage: If you think you have taken too much of this medicine contact a poison control center or emergency room at once. NOTE: This medicine is only for you. Do not share this medicine with others. What if I miss a dose? Keep appointments for follow-up doses. It is important not to miss a dose. Call your care team if you are unable to keep an appointment. What may interact with this medication? Do not take this medication with any of the following: Cisapride Dronedarone Pimozide Thioridazine This medication may also interact with the following: Aspirin and aspirin-like medications Certain medications that treat or prevent blood clots, such as warfarin, apixaban, dabigatran, and  rivaroxaban Cisplatin Cyclosporine Diuretics Medications for infection, such as acyclovir, adefovir, amphotericin B, bacitracin, cidofovir, foscarnet, ganciclovir, gentamicin, pentamidine, vancomycin NSAIDs, medications for pain and inflammation, such as ibuprofen or naproxen Other medications that cause heart rhythm changes Pamidronate Zoledronic acid This list may not describe all possible interactions. Give your health care provider a list of all the medicines, herbs, non-prescription drugs, or dietary supplements you use. Also tell them if you smoke, drink alcohol, or use illegal drugs. Some items may interact with your medicine. What should I watch for while using this medication? Your condition will be monitored carefully while you are receiving this medication. You may need blood work while taking this medication. This medication may make you feel generally unwell. This is not uncommon as chemotherapy can affect healthy cells as well as cancer cells. Report any side effects. Continue your course of treatment even though you feel ill unless your care team tells you to stop.   This medication may increase your risk of getting an infection. Call your care team for advice if you get a fever, chills, sore throat, or other symptoms of a cold or flu. Do not treat yourself. Try to avoid being around people who are sick. Avoid taking medications that contain aspirin, acetaminophen, ibuprofen, naproxen, or ketoprofen unless instructed by your care team. These medications may hide a fever. Be careful brushing or flossing your teeth or using a toothpick because you may get an infection or bleed more easily. If you have any dental work done, tell your dentist you are receiving this medication. This medication can make you more sensitive to cold. Do not drink cold drinks or use ice. Cover exposed skin before coming in contact with cold temperatures or cold objects. When out in cold weather wear warm clothing  and cover your mouth and nose to warm the air that goes into your lungs. Tell your care team if you get sensitive to the cold. Talk to your care team if you or your partner are pregnant or think either of you might be pregnant. This medication can cause serious birth defects if taken during pregnancy and for 9 months after the last dose. A negative pregnancy test is required before starting this medication. A reliable form of contraception is recommended while taking this medication and for 9 months after the last dose. Talk to your care team about effective forms of contraception. Do not father a child while taking this medication and for 6 months after the last dose. Use a condom while having sex during this time period. Do not breastfeed while taking this medication and for 3 months after the last dose. This medication may cause infertility. Talk to your care team if you are concerned about your fertility. What side effects may I notice from receiving this medication? Side effects that you should report to your care team as soon as possible: Allergic reactions--skin rash, itching, hives, swelling of the face, lips, tongue, or throat Bleeding--bloody or black, tar-like stools, vomiting blood or brown material that looks like coffee grounds, red or dark brown urine, small red or purple spots on skin, unusual bruising or bleeding Dry cough, shortness of breath or trouble breathing Heart rhythm changes--fast or irregular heartbeat, dizziness, feeling faint or lightheaded, chest pain, trouble breathing Infection--fever, chills, cough, sore throat, wounds that don't heal, pain or trouble when passing urine, general feeling of discomfort or being unwell Liver injury--right upper belly pain, loss of appetite, nausea, light-colored stool, dark yellow or brown urine, yellowing skin or eyes, unusual weakness or fatigue Low red blood cell level--unusual weakness or fatigue, dizziness, headache, trouble  breathing Muscle injury--unusual weakness or fatigue, muscle pain, dark yellow or brown urine, decrease in amount of urine Pain, tingling, or numbness in the hands or feet Sudden and severe headache, confusion, change in vision, seizures, which may be signs of posterior reversible encephalopathy syndrome (PRES) Unusual bruising or bleeding Side effects that usually do not require medical attention (report to your care team if they continue or are bothersome): Diarrhea Nausea Pain, redness, or swelling with sores inside the mouth or throat Unusual weakness or fatigue Vomiting This list may not describe all possible side effects. Call your doctor for medical advice about side effects. You may report side effects to FDA at 1-800-FDA-1088. Where should I keep my medication? This medication is given in a hospital or clinic. It will not be stored at home. NOTE: This sheet is a summary. It may   not cover all possible information. If you have questions about this medicine, talk to your doctor, pharmacist, or health care provider.  2023 [employer]/Gold Standard (2007-10-25 00:00:00)  

## 2023-03-15 ENCOUNTER — Inpatient Hospital Stay: Payer: Medicaid Other

## 2023-03-15 VITALS — BP 170/100 | HR 88 | Temp 98.0°F | Resp 18

## 2023-03-15 DIAGNOSIS — C786 Secondary malignant neoplasm of retroperitoneum and peritoneum: Secondary | ICD-10-CM

## 2023-03-15 DIAGNOSIS — Z5111 Encounter for antineoplastic chemotherapy: Secondary | ICD-10-CM | POA: Diagnosis not present

## 2023-03-15 MED ORDER — HEPARIN SOD (PORK) LOCK FLUSH 100 UNIT/ML IV SOLN
500.0000 [IU] | Freq: Once | INTRAVENOUS | Status: AC | PRN
  Administered 2023-03-15: 500 [IU]

## 2023-03-15 MED ORDER — SODIUM CHLORIDE 0.9 % IV SOLN
10.0000 mg | Freq: Once | INTRAVENOUS | Status: AC
  Administered 2023-03-15: 10 mg via INTRAVENOUS
  Filled 2023-03-15: qty 10

## 2023-03-15 MED ORDER — SODIUM CHLORIDE 0.9% FLUSH
10.0000 mL | INTRAVENOUS | Status: DC | PRN
  Administered 2023-03-15: 10 mL

## 2023-03-15 NOTE — Progress Notes (Signed)
Patient no longer heaving or vomiting- Patient agrees to take BP meds and Klonipin as directed once home- Also agrees to call triage line if symptoms persist or worsen- Darl Pikes Phy Pharm D and Belva Crome PA aware

## 2023-03-15 NOTE — Patient Instructions (Signed)
Managing Chemotherapy Side Effects, Adult Chemotherapy is a treatment that uses medicine to kill cancer cells. However, in addition to killing cancer cells, the medicines can also damage healthy cells. The damage to healthy cells can lead to side effects. The exact side effects depend on the specific medicines used. Most of the side effects of chemotherapy go away once treatment is finished. Until then, work closely with your health care providers and take an active role in managing your side effects. What are common side effects of chemotherapy? Increased risk of infection, bruising, or bleeding. Nausea and vomiting. Constipation or diarrhea. Loss of appetite. Hair loss. Mouth or throat sores. Tiredness (fatigue). Tingling, pain, or numbness in the hands and feet. Dry, sensitive, itchy, or sore skin. Sleep disturbances, such as excessive sleepiness. Confusion, anxiety, or mood swings. Memory changes. How to manage the side effects of chemotherapy Medicines Take over-the-counter and prescription medicines only as told by your health care provider. Talk with your health care provider before taking vitamins, herbs, supplements, or over-the-counter medicines. Some of these can interfere with chemotherapy. Activity Get plenty of rest. Get regular exercise by doing activities such as walking, gentle yoga, or tai chi. Return to your normal activities as told by your health care provider. Ask your health care provider what activities are safe for you. Eating and drinking  Talk to a dietitian about what you should eat and drink during cancer treatment. Drink enough fluid to keep your urine pale yellow. If you have side effects that affect eating, these tips may help: Eat smaller meals and snacks often. Drink high-nutrition and high-calorie shakes or supplements. Choose bland and soft foods that are easy to eat. Do not eat foods that are hot, spicy, or hard to swallow. Do not eat raw or  undercooked meat, eggs, or seafood. Always wash fresh fruits and vegetables well before eating them. Skin care If you have sore or itchy skin: Wear soft, comfortable clothing. Apply creams and ointments to your skin as told by your health care provider. If you lose your hair, consider wearing a wig, hat, or scarf to cover your head. You may want to have someone shave your head as you start to lose hair. During outdoor activities, protect your head and skin from the sun by using sunscreen with an SPF of 30 or higher or by wearing protective clothing and a hat. Meet with a hair and skin care specialist for makeup and skin care tips. Apply sunscreen to your scalp as told by your health care provider. General tips Learn as much as you can about your condition. If you are struggling emotionally, talk with a mental health care provider or join a support group. Keep all follow-up visits. This is important. How to prevent infection and bleeding Chemotherapy may lower your blood counts and put you at risk for infection and bleeding. Here are some ways to help prevent problems. Vaccines Talk to your health care provider about vaccines. You should not get any live vaccines, such as the polio, MMR, chickenpox, and shingles vaccines until your health care provider says that it is safe to do so. Do not be around people who have had live vaccines for as long as your health care provider recommends. Make sure you get a yearly flu shot. People who will be near you should also get a yearly flu shot. Social activity Stay away from crowded places where you could be exposed to germs. Do not be around people who may be sick or   people who have symptoms of a fever until they have been fever-free for at least 24 hours. Do not share food, cups, straws, or utensils with other people. Wear a mask when outside the home if your blood counts are low. Cleanliness  Wash your hands often for at least 20 seconds. Also make  sure that other members of your household wash their hands often. Brush your teeth twice daily using a soft toothbrush. Use mouth rinse only as told by your health care provider. Take a bath or shower daily unless your health care provider gives different instructions. General tips Take your temperature regularly, especially if you have chills or feel warm. Check with your health care provider: Before you travel. Before you have a dental procedure. Before you use a swimming pool, hot tub, or swim in a lake or ocean. If you get chemotherapy through an IV or port, check the site every day for signs of infection. Check for redness, swelling, pain, fluid, and warmth. Avoid activities that put you at risk for injury or bleeding. Use an electric razor to shave instead of a blade. Questions to ask your health care provider What are the most common side effects of my treatment? How will they affect my daily life? What can I do to manage them? What are some possible long-term side effects? What are possible complications? What support services are available? What number can I call with questions or concerns? Where to find support Cancer affects the entire family. Find out what family support resources are available from your cancer treatment center. For more support, turn to: Your cancer care team. Friends and family. Your religious community. Other people with cancer. Community-based or online support groups. Where to find more information National Cancer Institute: www.cancer.gov American Cancer Society: www.cancer.org Contact a health care provider if: You bleed or bruise more often. You notice blood in your urine or stool. You have any of these symptoms: A skin rash, or dry or itchy skin. A headache or stiff neck. Cold or flu symptoms. A cough. Persistent nausea or vomiting. Persistent diarrhea. Frequent urination, burning when passing urine, or foul-smelling urine. You cannot eat  because of mouth or throat pain. You are sad, confused, anxious, or depressed. Get help right away if: You have any of these symptoms: A fever or chills. Your health care provider should know about this right away. Redness, swelling, pain, fluid, or warmth near an IV site. Bleeding that you cannot stop. A seizure. You cannot swallow. You have chest pain. You have trouble breathing. A family member or caregiver should get help right away if you have a sudden or unusual change in behavior. These symptoms may be an emergency. Get help right away. Call 911. Do not wait to see if the symptoms will go away. Do not drive yourself to the hospital. Summary Chemotherapy is a treatment that uses medicine to kill cancer cells and can cause side effects. The specific side effects depend on the specific medicines used. Learn as much as you can about your condition. Ask about side effects to watch for and how to treat them. Seek out support and resources from others. Find out what family support resources are available from your cancer treatment center. Let your health care provider know if you notice any new, unusual, or worsening symptoms, especially fever or chills. This information is not intended to replace advice given to you by your health care provider. Make sure you discuss any questions you have with your health   care provider. Document Revised: 08/24/2021 Document Reviewed: 08/24/2021 Elsevier Patient Education  2024 Elsevier Inc.  

## 2023-03-15 NOTE — Progress Notes (Signed)
Patient presented to infusion clinic today for pump D/C- Nauseated and vomiting- States that antiemetics are not controlling nausea at home. Dorothy Spark and Cecelia Byars aware and orders received.   NS hung at [facility] to carry Dex as secondary. Darl Pikes Ph Pharm D aware

## 2023-03-18 ENCOUNTER — Inpatient Hospital Stay: Payer: Medicaid Other

## 2023-03-18 ENCOUNTER — Encounter: Payer: Self-pay | Admitting: Oncology

## 2023-03-18 ENCOUNTER — Inpatient Hospital Stay: Payer: Medicaid Other | Attending: Oncology

## 2023-03-18 VITALS — BP 180/90 | HR 85 | Temp 98.2°F | Resp 18 | Ht 59.0 in | Wt 118.0 lb

## 2023-03-18 DIAGNOSIS — Z3202 Encounter for pregnancy test, result negative: Secondary | ICD-10-CM | POA: Diagnosis not present

## 2023-03-18 DIAGNOSIS — C786 Secondary malignant neoplasm of retroperitoneum and peritoneum: Secondary | ICD-10-CM | POA: Insufficient documentation

## 2023-03-18 DIAGNOSIS — Z5189 Encounter for other specified aftercare: Secondary | ICD-10-CM | POA: Diagnosis not present

## 2023-03-18 DIAGNOSIS — G893 Neoplasm related pain (acute) (chronic): Secondary | ICD-10-CM

## 2023-03-18 DIAGNOSIS — I81 Portal vein thrombosis: Secondary | ICD-10-CM | POA: Insufficient documentation

## 2023-03-18 DIAGNOSIS — R112 Nausea with vomiting, unspecified: Secondary | ICD-10-CM

## 2023-03-18 DIAGNOSIS — N1330 Unspecified hydronephrosis: Secondary | ICD-10-CM | POA: Insufficient documentation

## 2023-03-18 DIAGNOSIS — I700 Atherosclerosis of aorta: Secondary | ICD-10-CM | POA: Insufficient documentation

## 2023-03-18 DIAGNOSIS — C259 Malignant neoplasm of pancreas, unspecified: Secondary | ICD-10-CM | POA: Insufficient documentation

## 2023-03-18 DIAGNOSIS — R188 Other ascites: Secondary | ICD-10-CM | POA: Diagnosis not present

## 2023-03-18 DIAGNOSIS — R630 Anorexia: Secondary | ICD-10-CM

## 2023-03-18 LAB — BASIC METABOLIC PANEL
BUN: 16 (ref 4–21)
CO2: 28 — AB (ref 13–22)
Chloride: 93 — AB (ref 99–108)
Creatinine: 0.6 (ref 0.5–1.1)
Glucose: 154
Potassium: 3.2 mEq/L — AB (ref 3.5–5.1)
Sodium: 129 — AB (ref 137–147)

## 2023-03-18 LAB — CBC: RBC: 4.12 (ref 3.87–5.11)

## 2023-03-18 LAB — HEPATIC FUNCTION PANEL
ALT: 12 U/L (ref 7–35)
AST: 18 (ref 13–35)
Alkaline Phosphatase: 125 (ref 25–125)
Bilirubin, Total: 0.7

## 2023-03-18 LAB — COMPREHENSIVE METABOLIC PANEL
Albumin: 3.3 — AB (ref 3.5–5.0)
Calcium: 8.7 (ref 8.7–10.7)

## 2023-03-18 LAB — CBC AND DIFFERENTIAL
HCT: 34 — AB (ref 36–46)
Hemoglobin: 11 — AB (ref 12.0–16.0)
Neutrophils Absolute: 4.57
Platelets: 174 10*3/uL (ref 150–400)
WBC: 5.5

## 2023-03-18 MED ORDER — SODIUM CHLORIDE 0.9 % IV SOLN: Freq: Once | INTRAVENOUS | Status: AC

## 2023-03-18 MED ORDER — FILGRASTIM-SNDZ 480 MCG/0.8ML IJ SOSY
480.0000 ug | PREFILLED_SYRINGE | Freq: Once | INTRAMUSCULAR | Status: AC
  Administered 2023-03-18: 480 ug via SUBCUTANEOUS
  Filled 2023-03-18: qty 0.8

## 2023-03-18 NOTE — Progress Notes (Signed)
Patient reports that she is "having hallucinations" states that she is seeing old pets and talking with people who are not really in her house- tearful. Dorothy Spark aware and Eugenia Mcalpine Unit Charge RN notified- NS started at 770ml/hr, labs drawn and sent to med onc via courier

## 2023-03-18 NOTE — Progress Notes (Signed)
Patient states that she feels some better. Denies having no hallucinations at this time- Denies thoughts of harming self or others. States that she has not taken any of her BP meds at home today. Dorothy Spark aware of assessment. Patient states understanding that infusion staff may call in am if she needs any electrolyte replacement.

## 2023-03-18 NOTE — Patient Instructions (Signed)
Dehydration, Adult Dehydration is a condition in which there is not enough water or other fluids in the body. This happens when a person loses more fluids than they take in. Important organs cannot work right without the right amount of fluids. Any loss of fluids from the body can cause dehydration. Dehydration can be mild, worse, or very bad. It should be treated right away to keep it from getting very bad. What are the causes? Conditions that cause loss of water in the body. They include: Watery poop (diarrhea). Vomiting. Sweating a lot. Fever. Infection. Peeing (urinating) a lot. Not drinking enough fluids. Certain medicines, such as medicines that take extra fluid out of the body (diuretics). Lack of safe drinking water. Not being able to get enough water and food. What increases the risk? Having a long-term (chronic) illness that has not been treated the right way, such as: Diabetes. Heart disease. Kidney disease. Being 65 years of age or older. Having a disability. Living in a place that is high above the ground or sea (high in altitude). The thinner, drier air causes more fluid loss. Doing exercises that put stress on your body for a long time. Being active when in hot places. What are the signs or symptoms? Symptoms of dehydration depend on how bad it is. Mild or worse dehydration Thirst. Dry lips or dry mouth. Feeling dizzy or light-headed. Muscle cramps. Passing little pee or dark pee. Pee may be the color of tea. Headache. Very bad dehydration Changes in skin. Skin may: Be cold to the touch (clammy). Be blotchy or pale. Not go back to normal right after you pinch it and let it go. Little or no tears, pee, or sweat. Fast breathing. Low blood pressure. Weak pulse. Pulse that is more than 100 beats a minute when you are sitting still. Other changes, such as: Feeling very thirsty. Eyes that look hollow (sunken). Cold hands and feet. Being confused. Being very  tired (lethargic) or having trouble waking from sleep. Losing weight. Loss of consciousness. How is this treated? Treatment for this condition depends on how bad your dehydration is. Treatment should start right away. Do not wait until your condition gets very bad. Very bad dehydration is an emergency. You will need to go to a hospital. Mild or worse dehydration can be treated at home. You may be asked to: Drink more fluids. Drink an oral rehydration solution (ORS). This drink gives you the right amount of fluids, salts, and minerals (electrolytes). Very bad dehydration can be treated: With fluids through an IV tube. By correcting low levels of electrolytes in the body. By treating the problem that caused your dehydration. Follow these instructions at home: Oral rehydration solution If told by your doctor, drink an ORS: Make an ORS. Use instructions on the package. Start by drinking small amounts, about  cup (120 mL) every 5-10 minutes. Slowly drink more until you have had the amount that your doctor said to have.  Eating and drinking  Drink enough clear fluid to keep your pee pale yellow. If you were told to drink an ORS, finish the ORS first. Then, start slowly drinking other clear fluids. Drink fluids such as: Water. Do not drink only water. Doing that can make the salt (sodium) level in your body get too low. Water from ice chips you suck on. Fruit juice that you have added water to (diluted). Low-calorie sports drinks. Eat foods that have the right amounts of salts and minerals, such as bananas, oranges, potatoes,   tomatoes, or spinach. Do not drink alcohol. Avoid drinks that have caffeine or sugar. These include:: High-calorie sports drinks. Fruit juice that you did not add water to. Soda. Coffee or energy drinks. Avoid foods that are greasy or have a lot of fat or sugar. General instructions Take over-the-counter and prescription medicines only as told by your doctor. Do  not take sodium tablets. Doing that can make the salt level in your body get too high. Return to your normal activities as told by your doctor. Ask your doctor what activities are safe for you. Keep all follow-up visits. Your doctor may check and change your treatment. Contact a doctor if: You have pain in your belly (abdomen) and the pain: Gets worse. Stays in one place. You have a rash. You have a stiff neck. You get angry or annoyed more easily than normal. You are more tired or have a harder time waking than normal. You feel weak or dizzy. You feel very thirsty. Get help right away if: You have any symptoms of very bad dehydration. You vomit every time you eat or drink. Your vomiting gets worse, does not go away, or you vomit blood or green stuff. You are getting treatment, but symptoms are getting worse. You have a fever. You have a very bad headache. You have: Diarrhea that gets worse or does not go away. Blood in your poop (stool). This may cause poop to look black and tarry. No pee in 6-8 hours. Only a small amount of pee in 6-8 hours, and the pee is very dark. You have trouble breathing. These symptoms may be an emergency. Get help right away. Call 911. Do not wait to see if the symptoms will go away. Do not drive yourself to the hospital. This information is not intended to replace advice given to you by your health care provider. Make sure you discuss any questions you have with your health care provider. Document Revised: 04/02/2022 Document Reviewed: 04/02/2022 Elsevier Patient Education  2024 Elsevier Inc.  

## 2023-03-18 NOTE — Progress Notes (Signed)
Dorothy Spark Pharm D in to evaluate patient.

## 2023-03-19 ENCOUNTER — Inpatient Hospital Stay: Payer: Medicaid Other

## 2023-03-19 ENCOUNTER — Encounter (HOSPITAL_COMMUNITY): Payer: Self-pay

## 2023-03-19 VITALS — BP 147/98 | HR 94 | Temp 98.3°F | Resp 22

## 2023-03-19 DIAGNOSIS — G893 Neoplasm related pain (acute) (chronic): Secondary | ICD-10-CM

## 2023-03-19 DIAGNOSIS — C259 Malignant neoplasm of pancreas, unspecified: Secondary | ICD-10-CM | POA: Diagnosis not present

## 2023-03-19 DIAGNOSIS — C786 Secondary malignant neoplasm of retroperitoneum and peritoneum: Secondary | ICD-10-CM

## 2023-03-19 DIAGNOSIS — R112 Nausea with vomiting, unspecified: Secondary | ICD-10-CM

## 2023-03-19 MED ORDER — SODIUM CHLORIDE 0.9 % IV SOLN: Freq: Once | INTRAVENOUS | Status: AC

## 2023-03-19 MED ORDER — FILGRASTIM-SNDZ 480 MCG/0.8ML IJ SOSY
480.0000 ug | PREFILLED_SYRINGE | Freq: Once | INTRAMUSCULAR | Status: AC
  Administered 2023-03-19: 480 ug via SUBCUTANEOUS
  Filled 2023-03-19: qty 0.8

## 2023-03-19 MED ORDER — POTASSIUM CHLORIDE 10 MEQ/100ML IV SOLN
10.0000 meq | INTRAVENOUS | Status: AC
  Administered 2023-03-19 (×2): 10 meq via INTRAVENOUS
  Filled 2023-03-19 (×2): qty 100

## 2023-03-19 NOTE — Patient Instructions (Addendum)
Hypokalemia Hypokalemia means that the amount of potassium in the blood is lower than normal. Potassium is a mineral (electrolyte) that helps regulate the amount of fluid in the body. It also stimulates muscle tightening (contraction) and helps nerves work properly. Normally, most of the body's potassium is inside cells, and only a very small amount is in the blood. Because the amount in the blood is so small, minor changes to potassium levels in the blood can be life-threatening. What are the causes? This condition may be caused by: Antibiotic medicine. Diarrhea or vomiting. Taking too much of a medicine that helps you have a bowel movement (laxative) can cause diarrhea and lead to hypokalemia. Chronic kidney disease (CKD). Medicines that help the body get rid of excess fluid (diuretics). Eating disorders, such as anorexia or bulimia. Low magnesium levels in the body. Sweating a lot. What are the signs or symptoms? Symptoms of this condition include: Weakness. Constipation. Fatigue. Muscle cramps. Mental confusion. Skipped heartbeats or irregular heartbeat (palpitations). Tingling or numbness. How is this diagnosed? This condition is diagnosed with a blood test. How is this treated? This condition may be treated by: Taking potassium supplements. Adjusting the medicines that you take. Eating more foods that contain a lot of potassium. If your potassium level is very low, you may need to get potassium through an IV and be monitored in the hospital. Follow these instructions at home: Eating and drinking  Eat a healthy diet. A healthy diet includes fresh fruits and vegetables, whole grains, healthy fats, and lean proteins. If told, eat more foods that contain a lot of potassium. These include: Nuts, such as peanuts and pistachios. Seeds, such as sunflower seeds and pumpkin seeds. Peas, lentils, and lima beans. Whole grain and bran cereals and breads. Fresh fruits and vegetables,  such as apricots, avocado, bananas, cantaloupe, kiwi, oranges, tomatoes, asparagus, and potatoes. Juices, such as orange, tomato, and prune. Lean meats, including fish. Milk and milk products, such as yogurt. General instructions Take over-the-counter and prescription medicines only as told by your health care provider. This includes vitamins, natural food products, and supplements. Keep all follow-up visits. This is important. Contact a health care provider if: You have weakness that gets worse. You feel your heart pounding or racing. You vomit. You have diarrhea. You have diabetes and you have trouble keeping your blood sugar in your target range. Get help right away if: You have chest pain. You have shortness of breath. You have vomiting or diarrhea that lasts for more than 2 days. You faint. These symptoms may be an emergency. Get help right away. Call 911. Do not wait to see if the symptoms will go away. Do not drive yourself to the hospital. Summary Hypokalemia means that the amount of potassium in the blood is lower than normal. This condition is diagnosed with a blood test. Hypokalemia may be treated by taking potassium supplements, adjusting the medicines that you take, or eating more foods that are high in potassium. If your potassium level is very low, you may need to get potassium through an IV and be monitored in the hospital. This information is not intended to replace advice given to you by your health care provider. Make sure you discuss any questions you have with your health care provider. Document Revised: 05/18/2021 Document Reviewed: 05/18/2021 Elsevier Patient Education  2024 Elsevier Inc. Filgrastim Injection What is this medication? FILGRASTIM (fil GRA stim) lowers the risk of infection in people who are receiving chemotherapy. It works by Dipl[employer] operational officer  your body make more white blood cells, which protects your body from infection. It may also be used to help people  who have been exposed to high doses of radiation. It can be used to help prepare your body before a stem cell transplant. It works by helping your bone marrow make and release stem cells into the blood. This medicine may be used for other purposes; ask your health care provider or pharmacist if you have questions. COMMON BRAND NAME(S): Neupogen, Nivestym, Releuko, Zarxio What should I tell my care team before I take this medication? They need to know if you have any of these conditions: History of blood diseases, such as sickle cell anemia Kidney disease Recent or ongoing radiation An unusual or allergic reaction to filgrastim, pegfilgrastim, latex, rubber, other medications, foods, dyes, or preservatives Pregnant or trying to get pregnant Breast-feeding How should I use this medication? This medication is injected under the skin or into a vein. It is usually given by your care team in a hospital or clinic setting. It may be given at home. If you get this medication at home, you will be taught how to prepare and give it. Use exactly as directed. Take it as directed on the prescription label at the same time every day. Keep taking it unless your care team tells you to stop. It is important that you put your used needles and syringes in a special sharps container. Do not put them in a trash can. If you do not have a sharps container, call your pharmacist or care team to get one. This medication comes with INSTRUCTIONS FOR USE. Ask your pharmacist for directions on how to use this medication. Read the information carefully. Talk to your pharmacist or care team if you have questions. Talk to your care team about the use of this medication in children. While it may be prescribed for children for selected conditions, precautions do apply. Overdosage: If you think you have taken too much of this medicine contact a poison control center or emergency room at once. NOTE: This medicine is only for you. Do not  share this medicine with others. What if I miss a dose? It is important not to miss any doses. Talk to your care team about what to do if you miss a dose. What may interact with this medication? Medications that may cause a release of neutrophils, such as lithium This list may not describe all possible interactions. Give your health care provider a list of all the medicines, herbs, non-prescription drugs, or dietary supplements you use. Also tell them if you smoke, drink alcohol, or use illegal drugs. Some items may interact with your medicine. What should I watch for while using this medication? Your condition will be monitored carefully while you are receiving this medication. You may need bloodwork while taking this medication. Talk to your care team about your risk of cancer. You may be more at risk for certain types of cancer if you take this medication. What side effects may I notice from receiving this medication? Side effects that you should report to your care team as soon as possible: Allergic reactions--skin rash, itching, hives, swelling of the face, lips, tongue, or throat Capillary leak syndrome--stomach or muscle pain, unusual weakness or fatigue, feeling faint or lightheaded, decrease in the amount of urine, swelling of the ankles, hands, or feet, trouble breathing High white blood cell level--fever, fatigue, trouble breathing, night sweats, change in vision, weight loss Inflammation of the aorta--fever, fatigue, back,  chest, or stomach pain, severe headache Kidney injury (glomerulonephritis)--decrease in the amount of urine, red or dark brown urine, foamy or bubbly urine, swelling of the ankles, hands, or feet Shortness of breath or trouble breathing Spleen injury--pain in upper left stomach or shoulder Unusual bruising or bleeding Side effects that usually do not require medical attention (report to your care team if they continue or are bothersome): Back pain Bone  pain Fatigue Fever Headache Nausea This list may not describe all possible side effects. Call your doctor for medical advice about side effects. You may report side effects to FDA at 1-800-FDA-1088. Where should I keep my medication? Keep out of the reach of children and pets. Keep this medication in the original packaging until you are ready to take it. Protect from light. See product for storage information. Each product may have different instructions. Get rid of any unused medication after the expiration date. To get rid of medications that are no longer needed or have expired: Take the medication to a medications take-back program. Check with your pharmacy or law enforcement to find a location. If you cannot return the medication, ask your pharmacist or care team how to get rid of this medication safely. NOTE: This sheet is a summary. It may not cover all possible information. If you have questions about this medicine, talk to your doctor, pharmacist, or health care provider.  2024 Elsevier/Gold Standard (2022-01-25 00:00:00)  2024 Elsevier/Gold Standard (2022-07-25 00:00:00) \[id number]\

## 2023-03-19 NOTE — Progress Notes (Signed)
Patient complains of severe restlessness-states she has felt this way off and on for several days- Christina [name] Phy notified and in to assess  patient- RN Charge Eugenia Mcalpine also aware. Patient agrees to be seen at the [facility]- will call for treatment and end of [facility] treatment.

## 2023-03-19 NOTE — Progress Notes (Signed)
Discharged to ED by EMS

## 2023-03-19 NOTE — Progress Notes (Signed)
Patient incontinent of large amount liquid stool. Assisted with personal care- EMS call for transport

## 2023-03-19 NOTE — Progress Notes (Signed)
Report called to Vernona Rieger RN charge nurse, [facility] ED.

## 2023-03-24 NOTE — Progress Notes (Unsigned)
[facility]  [address] [place],  [place]  [address] [phone]  Clinic Day:  03/25/2023  Referring physician: Barbie Banner, MD  HISTORY OF PRESENT ILLNESS:  The patient is a 47 y.o. female  with metastatic pancreatic cancer, with spread of disease to her peritoneum, uterus and ovaries.  Recent CT scans after 7 cycles of FOLFIRINOX clearly showed disease improvement.  Unfortunately, since her last visit, the patient began having increasing back pain and abdominal discomfort.  This led to CT scans being done in the emergency room, which showed new, bilateral hydronephrosis.  Furthermore, her metastatic ovarian lesions have increased in size. These scan results from July 2nd were in stark contrast to those done on June 13th.  The patient was transferred to [facility] where a Foley catheter was placed.  This immediately led to the improvement of her lower back pain.  Although she does feel better today, she understands that her recent scans unfortunately showed disease progression after 8 cycles of FOLFIRINOX chemotherapy.  She comes in today to discuss what further needs to be considered for her palliative disease management.  Of note, she brings to my attention that she is scheduled for a second opinion with oncology at [facility] on July 18th, which is on the same day that she sees urology there to reassess her bilateral hydronephrosis.    Her history dates back to January 2024 when she first began having lower quadrant abdominal pain.  Over a span of weeks, she began having abdominal swelling and distention.  She also began having increased constipation.  This constellation of symptoms led to her undergoing CT scans of her abdomen/pelvis, which revealed a 7.9 cm cystic pancreatic lesion, with an irregular stomach border.  There was extensive ascites, peritoneal carcinomatosis, bilateral adnexal masses and uterine involvement.  The  patient underwent a paracentesis in early February 2024, which revealed malignant cells, consistent with adenocarcinoma.  She also underwent an endometrial biopsy, which revealed metastatic adenocarcinoma.  In particular, immunohistochemical stains were positive for CK7/CDX2/CK20.  PAX8 and ER staining was negative. This pattern was consistent with a malignancy from an upper GI/pancreaticobiliary origin.  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   PHYSICAL EXAM:  Blood pressure (!) 147/99, pulse (!) 115, temperature 98.9 F (37.2 C), resp. rate 16, height 4\' 11"  (1.499 m), weight 118 lb 12.8 oz (53.9 kg), last menstrual period 03/11/2023, SpO2 100 %, unknown if currently breastfeeding. Wt Readings from Last 3 Encounters:  03/25/23 118 lb 12.8 oz (53.9 kg)  03/18/23 118 lb 0.6 oz (53.5 kg)  03/13/23 125 lb 0.6 oz (56.7 kg)   Body mass index is 23.99 kg/m. Performance status (ECOG): 1 Physical Exam Constitutional:      Appearance: Normal appearance.  HENT:     Mouth/Throat:     Mouth: Mucous membranes are moist.     Pharynx: Oropharynx is clear. No oropharyngeal exudate or posterior oropharyngeal erythema.  Cardiovascular:     Rate and Rhythm: Normal rate and regular rhythm.     Heart sounds: No murmur heard.    No friction rub. No gallop.  Pulmonary:     Effort: Pulmonary effort is normal. No respiratory distress.     Breath sounds: Normal breath sounds. No wheezing, rhonchi or rales.  Abdominal:     General: Bowel sounds are normal. There is no distension.     Palpations: Abdomen is soft. There is no mass.  Tenderness: There is no abdominal tenderness.  Musculoskeletal:        General: No swelling.     Right lower leg: No edema.     Left lower leg: No edema.  Lymphadenopathy:     Cervical: No cervical adenopathy.     Upper Body:     Right upper body: No supraclavicular or axillary adenopathy.     Left upper body: No supraclavicular or  axillary adenopathy.     Lower Body: No right inguinal adenopathy. No left inguinal adenopathy.  Skin:    General: Skin is warm.     Coloration: Skin is not jaundiced.     Findings: No lesion or rash.  Neurological:     General: No focal deficit present.     Mental Status: She is alert and oriented to person, place, and time. Mental status is at baseline.  Psychiatric:        Mood and Affect: Mood normal.        Behavior: Behavior normal.        Thought Content: Thought content normal.   SCANS:  CT scans of her abdomen/pelvis revealed the following: FINDINGS: Lower chest: The visualized lung bases are clear.  No intra-abdominal free air. Small ascites, significantly increased since the prior CT.  Hepatobiliary: The liver is unremarkable. No biliary dilatation. The gallbladder is unremarkable.  Pancreas: The pancreas is poorly visualized. The head and uncinate process of the pancreas appear unremarkable. There is atrophy of the body and tail of the pancreas. Several loculated fluid collections along the tail and body of the pancreas measure up to 3.2 x 2.3 cm and present on the prior CT.  Spleen: Normal in size without focal abnormality.  Adrenals/Urinary Tract: The adrenal glands unremarkable. There is mild to moderate left hydronephrosis. There is a 9 mm high attenuating none calcified lesion in the distal left ureter which may represent a noncalcified stone, a proteinaceous or hemorrhagic debris, or a urothelial neoplasm or metastases. Several nonobstructing left renal calculi measure up to 4 mm in the interpolar left kidney.  There is mild right hydronephrosis. A 9 mm high attenuating lesion noted at the right ureterovesical junction with similar differential diagnosis as above. Several punctate nonobstructing renal calculi noted.  Overall greater hydronephrosis on the left with delayed enhancement and decreased excretion of contrast by the left kidney.  The urinary  bladder is minimally distended. There is a 1.5 cm high attenuating lesion along the right posterior bladder wall (82/2) suspicious for metastatic disease.  Stomach/Bowel: There is a 6 x 8 cm loculated collection in the left upper abdomen exerting mass effect on the greater curvature of the stomach. This may represent an inflammatory collection and pancreatic pseudocyst. Necrotic metastasis is favored less likely as this was not present on the CT of 02/27/2023. There is thickened appearance of the distal colon likely related to ascites. Colitis is not excluded clinical correlation is recommended. No evidence of bowel obstruction.  Vascular/Lymphatic: Mild aortoiliac atherosclerotic disease. The IVC is unremarkable. No portal venous gas. No obvious adenopathy.  Reproductive: Ill-defined hypoenhancing uterine mass measures approximately 3.2 x 3.9 cm relatively similar to prior CT. There has been interval dilatation of the endometrium with fluid suggestive of obstruction. Bilateral complex ovarian masses measure 7.1 x 6.8 cm on the left and 4.2 x 4.9 cm on the right, increased since the prior CT.  Other: Faint foci of nodular enhancement along the right lower quadrant peritoneum (coronal 38/602) suspicious for metastatic disease.  Musculoskeletal:  Mild diffuse subcutaneous edema. No acute osseous pathology.  IMPRESSION: 1. Increased ascites and increase in the size of the ovarian masses since the prior CT. 2. 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 The largest cystic collection causes mass effect on the greater curvature of the stomach and is new since the prior CT. Necrotic metastasis is less likely. 3. Interval development of bilateral hydronephrosis, left greater than right, likely due to obstruction of distal ureters by metastatic lesions. Additional metastatic lesion along the right posterior bladder wall lateral to the  right UVJ. 4. Aortic Atherosclerosis (ICD10-I70.0).  Electronically Signed: By: Elgie Collard M.D. 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 This has progressed since the CT of 02/27/2023.  LABS:      Latest Ref Rng & Units 03/18/2023   12:00 AM 03/11/2023   11:40 AM 02/25/2023   12:00 AM  CBC  WBC  5.5     8.8  8.0      Hemoglobin 12.0 - 16.0 11.0     12.4  11.7      Hematocrit 36 - 46 34     40.3  37      Platelets 150 - 400 K/uL 174     300  291         This result is from an external source.      Latest Ref Rng & Units 03/18/2023   12:00 AM 03/11/2023   11:40 AM 02/25/2023    1:00 PM  CMP  Glucose 70 - 99 mg/dL  161  096   BUN 4 - 21 16     14  9    Creatinine 0.5 - 1.1 0.6     0.80  0.48   Sodium 137 - 147 129     132  136   Potassium 3.5 - 5.1 mEq/L 3.2     3.1  3.6   Chloride 99 - 108 93     90  100   CO2 13 - 22 28     29  26    Calcium 8.7 - 10.7 8.7     9.2  9.2   Total Protein 6.5 - 8.1 g/dL  6.8  6.7   Total Bilirubin 0.3 - 1.2 mg/dL  0.7  0.4   Alkaline Phos 25 - 125 125     92  86   AST 13 - 35 18     14  15    ALT 7 - 35 U/L 12     11  12       This result is from an external source.   ASSESSMENT & PLAN:  A 47 y.o. female with metastatic pancreatic cancer.  In clinic today, I went over her most recent CT scan images with her, for which she could see that her metastatic disease has significantly progressed, even when compared to scans done 3 weeks ago.  Based upon this, the patient understands she is dealing with disease progression to where a change in therapy is warranted.  In clinic today, I will have her peripheral blood collected and sent to [facility] to determine if any mutations/alterations are present which would make her disease susceptible to targeted therapy.  If not, then I would likely consider standard second-line  chemotherapy with gemcitabine/Abraxane.  As mentioned previously, she is scheduled to go to [facility] for a second opinion on July 18th, which I have no problem with her doing.  If they have any attractive  clinical trial for which she would be eligible, I would be in the 100% agreement with her choosing  such an option.  Clinically, despite her disease progression, she appears to be doing fairly well today.  I will see her back in 2 weeks to go over her [facility] test results, which will be used to determine what her next line of therapy will be. The patient understands all the plans discussed today and is in agreement with them.   Javona Bergevin Kirby Funk, MD

## 2023-03-25 ENCOUNTER — Inpatient Hospital Stay (INDEPENDENT_AMBULATORY_CARE_PROVIDER_SITE_OTHER): Payer: Medicaid Other | Admitting: Oncology

## 2023-03-25 ENCOUNTER — Inpatient Hospital Stay: Payer: Medicaid Other

## 2023-03-25 ENCOUNTER — Other Ambulatory Visit: Payer: Self-pay | Admitting: Oncology

## 2023-03-25 DIAGNOSIS — C786 Secondary malignant neoplasm of retroperitoneum and peritoneum: Secondary | ICD-10-CM | POA: Diagnosis not present

## 2023-03-25 DIAGNOSIS — C259 Malignant neoplasm of pancreas, unspecified: Secondary | ICD-10-CM | POA: Diagnosis not present

## 2023-03-25 LAB — CBC: RBC: 3.95 (ref 3.87–5.11)

## 2023-03-25 LAB — CMP (CANCER CENTER ONLY)
ALT: 15 U/L (ref 0–44)
AST: 12 U/L — ABNORMAL LOW (ref 15–41)
Albumin: 2.6 g/dL — ABNORMAL LOW (ref 3.5–5.0)
Alkaline Phosphatase: 117 U/L (ref 38–126)
Anion gap: 13 (ref 5–15)
BUN: 13 mg/dL (ref 6–20)
CO2: 25 mmol/L (ref 22–32)
Calcium: 8.9 mg/dL (ref 8.9–10.3)
Chloride: 95 mmol/L — ABNORMAL LOW (ref 98–111)
Creatinine: 1.07 mg/dL — ABNORMAL HIGH (ref 0.44–1.00)
GFR, Estimated: >60 mL/min (ref >60)
Glucose, Bld: 159 mg/dL — ABNORMAL HIGH (ref 70–99)
Potassium: 3.2 mmol/L — ABNORMAL LOW (ref 3.5–5.1)
Sodium: 133 mmol/L — ABNORMAL LOW (ref 135–145)
Total Bilirubin: 0.2 mg/dL — ABNORMAL LOW (ref 0.3–1.2)
Total Protein: 6.9 g/dL (ref 6.5–8.1)

## 2023-03-25 LAB — CBC AND DIFFERENTIAL
HCT: 32 — AB (ref 36–46)
Hemoglobin: 10.6 — AB (ref 12.0–16.0)
Neutrophils Absolute: 1.87
Platelets: 305 10*3/uL (ref 150–400)
WBC: 3.9

## 2023-03-26 ENCOUNTER — Other Ambulatory Visit: Payer: Self-pay

## 2023-03-26 LAB — PREGNANCY, URINE: Preg Test, Ur: NEGATIVE

## 2023-03-27 ENCOUNTER — Ambulatory Visit: Payer: Medicaid Other

## 2023-04-01 ENCOUNTER — Other Ambulatory Visit: Payer: Self-pay | Admitting: Oncology

## 2023-04-01 ENCOUNTER — Other Ambulatory Visit: Payer: Self-pay

## 2023-04-01 ENCOUNTER — Telehealth: Payer: Self-pay

## 2023-04-01 ENCOUNTER — Ambulatory Visit: Payer: Medicaid Other

## 2023-04-01 DIAGNOSIS — C786 Secondary malignant neoplasm of retroperitoneum and peritoneum: Secondary | ICD-10-CM

## 2023-04-01 DIAGNOSIS — G893 Neoplasm related pain (acute) (chronic): Secondary | ICD-10-CM

## 2023-04-01 MED ORDER — OXYCODONE HCL 10 MG PO TABS: ORAL_TABLET | ORAL | 0 refills | Status: AC

## 2023-04-01 NOTE — Telephone Encounter (Addendum)
Pt went to ER last night. She was treated and discharged with new prescription to increase fentanyl patch to total of 157mcg/72hr.  Pt has vomited 5-6 times in last 24 hrs. I asked what she had taken for nausea? She replied, "I took  compazine a little while ago". I told her to alternate the nausea medications, and keep a log to keep her on schedule. She states she has compazine, zofran and zyprexia in the home. She needs refill of oxycodone. She only has 1 pill left. I have sent refill request for oxycodone to Dr Melvyn Neth.    Dr Melvyn Neth sent in refill for the oxycodone 10mg .

## 2023-04-02 ENCOUNTER — Ambulatory Visit: Payer: Medicaid Other

## 2023-04-02 ENCOUNTER — Encounter: Payer: Self-pay | Admitting: Oncology

## 2023-04-07 NOTE — Progress Notes (Deleted)
[facility]  [address] [place],  [place]  [address] [phone]  Clinic Day:  03/25/2023  Referring physician: Weston Settle, MD  HISTORY OF PRESENT ILLNESS:  The patient is a 47 y.o. female  with metastatic pancreatic cancer, with spread of disease to her peritoneum, uterus and ovaries.  Recent CT scans after 7 cycles of FOLFIRINOX clearly showed disease improvement.  Unfortunately, since her last visit, the patient began having increasing back pain and abdominal discomfort.  This led to CT scans being done in the emergency room, which showed new, bilateral hydronephrosis.  Furthermore, her metastatic ovarian lesions have increased in size. These scan results from July 2nd were in stark contrast to those done on June 13th.  The patient was transferred to [facility], where a Foley catheter was placed.  This immediately led to the improvement of her lower back pain.  Although she does feel better today, she understands that her recent scans unfortunately showed disease progression after 8 cycles of FOLFIRINOX chemotherapy.  She comes in today to discuss what further needs to be considered for her palliative disease management.  Of note, she brings to my attention that she is scheduled for a second opinion with oncology at [facility] on July 18th, which is on the same day that she sees urology there to reassess her bilateral hydronephrosis.    Her history dates back to January 2024 when she first began having lower quadrant abdominal pain.  Over a span of weeks, she began having abdominal swelling and distention.  She also began having increased constipation.  This constellation of symptoms led to her undergoing CT scans of her abdomen/pelvis, which revealed a 7.9 cm cystic pancreatic lesion, with an irregular stomach border.  There was extensive ascites, peritoneal carcinomatosis, bilateral adnexal masses and uterine involvement.  The  patient underwent a paracentesis in early February 2024, which revealed malignant cells, consistent with adenocarcinoma.  She also underwent an endometrial biopsy, which revealed metastatic adenocarcinoma.  In particular, immunohistochemical stains were positive for CK7/CDX2/CK20.  PAX8 and ER staining was negative. This pattern was consistent with a malignancy from an upper GI/pancreaticobiliary origin.  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   PHYSICAL EXAM:  Last menstrual period 03/11/2023, unknown if currently breastfeeding. Wt Readings from Last 3 Encounters:  03/25/23 118 lb 12.8 oz (53.9 kg)  03/18/23 118 lb 0.6 oz (53.5 kg)  03/13/23 125 lb 0.6 oz (56.7 kg)   There is no height or weight on file to calculate BMI. Performance status (ECOG): 1 Physical Exam Constitutional:      Appearance: Normal appearance.  HENT:     Mouth/Throat:     Mouth: Mucous membranes are moist.     Pharynx: Oropharynx is clear. No oropharyngeal exudate or posterior oropharyngeal erythema.  Cardiovascular:     Rate and Rhythm: Normal rate and regular rhythm.     Heart sounds: No murmur heard.    No friction rub. No gallop.  Pulmonary:     Effort: Pulmonary effort is normal. No respiratory distress.     Breath sounds: Normal breath sounds. No wheezing, rhonchi or rales.  Abdominal:     General: Bowel sounds are normal. There is no distension.     Palpations: Abdomen is soft. There is no mass.     Tenderness: There is no abdominal tenderness.  Musculoskeletal:        General: No swelling.     Right  lower leg: No edema.     Left lower leg: No edema.  Lymphadenopathy:     Cervical: No cervical adenopathy.     Upper Body:     Right upper body: No supraclavicular or axillary adenopathy.     Left upper body: No supraclavicular or axillary adenopathy.     Lower Body: No right inguinal adenopathy. No left inguinal adenopathy.  Skin:    General: Skin is warm.      Coloration: Skin is not jaundiced.     Findings: No lesion or rash.  Neurological:     General: No focal deficit present.     Mental Status: She is alert and oriented to person, place, and time. Mental status is at baseline.  Psychiatric:        Mood and Affect: Mood normal.        Behavior: Behavior normal.        Thought Content: Thought content normal.   SCANS:  CT scans of her abdomen/pelvis revealed the following: FINDINGS: Lower chest: The visualized lung bases are clear.  No intra-abdominal free air. Small ascites, significantly increased since the prior CT.  Hepatobiliary: The liver is unremarkable. No biliary dilatation. The gallbladder is unremarkable.  Pancreas: The pancreas is poorly visualized. The head and uncinate process of the pancreas appear unremarkable. There is atrophy of the body and tail of the pancreas. Several loculated fluid collections along the tail and body of the pancreas measure up to 3.2 x 2.3 cm and present on the prior CT.  Spleen: Normal in size without focal abnormality.  Adrenals/Urinary Tract: The adrenal glands unremarkable. There is mild to moderate left hydronephrosis. There is a 9 mm high attenuating none calcified lesion in the distal left ureter which may represent a noncalcified stone, a proteinaceous or hemorrhagic debris, or a urothelial neoplasm or metastases. Several nonobstructing left renal calculi measure up to 4 mm in the interpolar left kidney.  There is mild right hydronephrosis. A 9 mm high attenuating lesion noted at the right ureterovesical junction with similar differential diagnosis as above. Several punctate nonobstructing renal calculi noted.  Overall greater hydronephrosis on the left with delayed enhancement and decreased excretion of contrast by the left kidney.  The urinary bladder is minimally distended. There is a 1.5 cm high attenuating lesion along the right posterior bladder wall (82/2) suspicious for  metastatic disease.  Stomach/Bowel: There is a 6 x 8 cm loculated collection in the left upper abdomen exerting mass effect on the greater curvature of the stomach. This may represent an inflammatory collection and pancreatic pseudocyst. Necrotic metastasis is favored less likely as this was not present on the CT of 02/27/2023. There is thickened appearance of the distal colon likely related to ascites. Colitis is not excluded clinical correlation is recommended. No evidence of bowel obstruction.  Vascular/Lymphatic: Mild aortoiliac atherosclerotic disease. The IVC is unremarkable. No portal venous gas. No obvious adenopathy.  Reproductive: Ill-defined hypoenhancing uterine mass measures approximately 3.2 x 3.9 cm relatively similar to prior CT. There has been interval dilatation of the endometrium with fluid suggestive of obstruction. Bilateral complex ovarian masses measure 7.1 x 6.8 cm on the left and 4.2 x 4.9 cm on the right, increased since the prior CT.  Other: Faint foci of nodular enhancement along the right lower quadrant peritoneum (coronal 38/602) suspicious for metastatic disease.  Musculoskeletal: Mild diffuse subcutaneous edema. No acute osseous pathology.  IMPRESSION: 1. Increased ascites and increase in the size of the ovarian masses since  the prior CT. 2. 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 The largest cystic collection causes mass effect on the greater curvature of the stomach and is new since the prior CT. Necrotic metastasis is less likely. 3. Interval development of bilateral hydronephrosis, left greater than right, likely due to obstruction of distal ureters by metastatic lesions. Additional metastatic lesion along the right posterior bladder wall lateral to the right UVJ. 4. Aortic Atherosclerosis (ICD10-I70.0).  Electronically Signed: By: Elgie Collard M.D. 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 This has progressed since the CT of 02/27/2023.  LABS:      Latest Ref Rng & Units 03/25/2023   12:00 AM 03/18/2023   12:00 AM 03/11/2023   11:40 AM  CBC  WBC  3.9     5.5     8.8   Hemoglobin 12.0 - 16.0 10.6     11.0     12.4   Hematocrit 36 - 46 32     34     40.3   Platelets 150 - 400 K/uL 305     174     300      This result is from an external source.      Latest Ref Rng & Units 03/25/2023    2:39 PM 03/18/2023   12:00 AM 03/11/2023   11:40 AM  CMP  Glucose 70 - 99 mg/dL 540   981   BUN 6 - 20 mg/dL 13  16     14    Creatinine 0.44 - 1.00 mg/dL 1.91  0.6     4.78   Sodium 135 - 145 mmol/L 133  129     132   Potassium 3.5 - 5.1 mmol/L 3.2  3.2     3.1   Chloride 98 - 111 mmol/L 95  93     90   CO2 22 - 32 mmol/L 25  28     29    Calcium 8.9 - 10.3 mg/dL 8.9  8.7     9.2   Total Protein 6.5 - 8.1 g/dL 6.9   6.8   Total Bilirubin 0.3 - 1.2 mg/dL 0.2   0.7   Alkaline Phos 38 - 126 U/L 117  125     92   AST 15 - 41 U/L 12  18     14    ALT 0 - 44 U/L 15  12     11       This result is from an external source.   ASSESSMENT & PLAN:  A 47 y.o. female with metastatic pancreatic cancer.  In clinic today, I went over her most recent CT scan images with her, for which she could see that her metastatic disease has significantly progressed, even when compared to scans done 3 weeks ago.  Based upon this, the patient understands she is dealing with disease progression to where a change in therapy is warranted.  In clinic today, I will have her peripheral blood collected and sent to [facility] to determine if any mutations/alterations are present which would make her disease susceptible to targeted therapy.  If not, then I would likely consider standard second-line chemotherapy with gemcitabine/Abraxane.  As mentioned previously, she is scheduled to go to [facility] for a second opinion on July 18th, which I have no problem  with her doing.  If they have any attractive  clinical trial for which she would be eligible, I would be in the 100% agreement with her choosing  such an option.  Clinically, despite her disease progression, she appears to be doing fairly well today.  I will see her back in 2 weeks to go over her [facility] test results, which will be used to determine what her next line of therapy will be. The patient understands all the plans discussed today and is in agreement with them.    Kirby Funk, MD

## 2023-04-08 ENCOUNTER — Other Ambulatory Visit: Payer: Medicaid Other

## 2023-04-08 ENCOUNTER — Inpatient Hospital Stay: Payer: Medicaid Other | Admitting: Oncology

## 2023-04-08 ENCOUNTER — Telehealth: Payer: Self-pay | Admitting: Oncology

## 2023-04-08 NOTE — Telephone Encounter (Signed)
04/08/2023  Patient is IP at [facility]

## 2023-04-10 ENCOUNTER — Ambulatory Visit: Payer: Medicaid Other

## 2023-04-15 ENCOUNTER — Ambulatory Visit: Payer: Medicaid Other

## 2023-04-16 ENCOUNTER — Ambulatory Visit: Payer: Medicaid Other

## 2023-04-22 ENCOUNTER — Other Ambulatory Visit: Payer: Medicaid Other

## 2023-04-22 ENCOUNTER — Ambulatory Visit: Payer: Medicaid Other | Admitting: Hematology and Oncology

## 2023-04-24 ENCOUNTER — Ambulatory Visit: Payer: Medicaid Other

## 2023-04-29 ENCOUNTER — Ambulatory Visit: Payer: Medicaid Other

## 2023-04-30 ENCOUNTER — Ambulatory Visit: Payer: Medicaid Other

## 2023-05-06 ENCOUNTER — Ambulatory Visit: Payer: Medicaid Other | Admitting: Oncology

## 2023-05-06 ENCOUNTER — Other Ambulatory Visit: Payer: Medicaid Other

## 2023-05-08 ENCOUNTER — Ambulatory Visit: Payer: Medicaid Other

## 2023-05-13 ENCOUNTER — Ambulatory Visit: Payer: Medicaid Other

## 2023-05-14 ENCOUNTER — Ambulatory Visit: Payer: Medicaid Other

## 2024-07-01 ENCOUNTER — Other Ambulatory Visit (HOSPITAL_COMMUNITY): Payer: Self-pay
